# Patient Record
Sex: Male | Born: 1990 | Race: White | Hispanic: No | Marital: Single | Smoking: Current every day smoker
Health system: Southern US, Community
[De-identification: ages and names within clinical notes are randomized; demographics above are authoritative.]

## PROBLEM LIST (undated history)

## (undated) DIAGNOSIS — R51 Headache: Secondary | ICD-10-CM

## (undated) DIAGNOSIS — R519 Headache, unspecified: Secondary | ICD-10-CM

## (undated) DIAGNOSIS — T7840XA Allergy, unspecified, initial encounter: Secondary | ICD-10-CM

## (undated) DIAGNOSIS — N059 Unspecified nephritic syndrome with unspecified morphologic changes: Secondary | ICD-10-CM

## (undated) HISTORY — DX: Headache, unspecified: R51.9

## (undated) HISTORY — DX: Allergy, unspecified, initial encounter: T78.40XA

## (undated) HISTORY — DX: Headache: R51

---
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
## 2010-04-20 ENCOUNTER — Emergency Department: Payer: Self-pay | Admitting: Emergency Medicine

## 2012-07-02 ENCOUNTER — Emergency Department: Payer: Self-pay | Admitting: Emergency Medicine

## 2012-07-02 LAB — CBC WITH DIFFERENTIAL/PLATELET
Basophil #: 0 10*3/uL (ref 0.0–0.1)
Basophil %: 0.5 %
Eosinophil #: 0.1 10*3/uL (ref 0.0–0.7)
Eosinophil %: 1 %
HGB: 15.6 g/dL (ref 13.0–18.0)
Lymphocyte #: 1.9 10*3/uL (ref 1.0–3.6)
MCH: 34.2 pg — ABNORMAL HIGH (ref 26.0–34.0)
MCHC: 35.7 g/dL (ref 32.0–36.0)
MCV: 96 fL (ref 80–100)
Monocyte #: 0.5 x10 3/mm (ref 0.2–1.0)
Monocyte %: 8 %
Neutrophil #: 3.6 10*3/uL (ref 1.4–6.5)
Neutrophil %: 58.5 %
RBC: 4.57 10*6/uL (ref 4.40–5.90)

## 2012-07-02 LAB — ETHANOL
Ethanol %: 0.166 % — ABNORMAL HIGH (ref 0.000–0.080)
Ethanol: 166 mg/dL

## 2012-07-02 LAB — COMPREHENSIVE METABOLIC PANEL
Alkaline Phosphatase: 83 U/L (ref 50–136)
BUN: 7 mg/dL (ref 7–18)
Bilirubin,Total: 0.4 mg/dL (ref 0.2–1.0)
Creatinine: 0.8 mg/dL (ref 0.60–1.30)
EGFR (African American): >60
EGFR (Non-African Amer.): >60
Glucose: 103 mg/dL — ABNORMAL HIGH (ref 65–99)
Potassium: 4.1 mmol/L (ref 3.5–5.1)
SGOT(AST): 30 U/L (ref 15–37)
SGPT (ALT): 20 U/L
Total Protein: 7.4 g/dL (ref 6.4–8.2)

## 2014-02-23 ENCOUNTER — Encounter: Payer: Self-pay | Admitting: Adult Health

## 2014-02-23 ENCOUNTER — Ambulatory Visit (INDEPENDENT_AMBULATORY_CARE_PROVIDER_SITE_OTHER): Payer: 59 | Admitting: Adult Health

## 2014-02-23 VITALS — BP 120/68 | HR 92 | Temp 98.1°F | Resp 16 | Ht 67.5 in | Wt 164.0 lb

## 2014-02-23 DIAGNOSIS — Z0000 Encounter for general adult medical examination without abnormal findings: Secondary | ICD-10-CM | POA: Insufficient documentation

## 2014-02-23 LAB — CBC WITH DIFFERENTIAL/PLATELET
Basophils Absolute: 0 10*3/uL (ref 0.0–0.1)
Basophils Relative: 0.3 % (ref 0.0–3.0)
EOS ABS: 0.3 10*3/uL (ref 0.0–0.7)
Eosinophils Relative: 3.9 % (ref 0.0–5.0)
HEMATOCRIT: 46.9 % (ref 39.0–52.0)
Hemoglobin: 16.1 g/dL (ref 13.0–17.0)
LYMPHS ABS: 2.3 10*3/uL (ref 0.7–4.0)
Lymphocytes Relative: 28.4 % (ref 12.0–46.0)
MCHC: 34.4 g/dL (ref 30.0–36.0)
MCV: 95.9 fl (ref 78.0–100.0)
MONO ABS: 0.6 10*3/uL (ref 0.1–1.0)
Monocytes Relative: 6.9 % (ref 3.0–12.0)
Neutro Abs: 4.9 10*3/uL (ref 1.4–7.7)
Neutrophils Relative %: 60.5 % (ref 43.0–77.0)
PLATELETS: 282 10*3/uL (ref 150.0–400.0)
RBC: 4.89 Mil/uL (ref 4.22–5.81)
RDW: 13.3 % (ref 11.5–14.6)
WBC: 8 10*3/uL (ref 4.5–10.5)

## 2014-02-23 LAB — COMPREHENSIVE METABOLIC PANEL
ALBUMIN: 4.5 g/dL (ref 3.5–5.2)
ALT: 19 U/L (ref 0–53)
AST: 25 U/L (ref 0–37)
Alkaline Phosphatase: 66 U/L (ref 39–117)
BUN: 11 mg/dL (ref 6–23)
CALCIUM: 9.4 mg/dL (ref 8.4–10.5)
CHLORIDE: 104 meq/L (ref 96–112)
CO2: 25 meq/L (ref 19–32)
CREATININE: 0.9 mg/dL (ref 0.4–1.5)
GFR: 115.38 mL/min (ref >60.00)
Glucose, Bld: 74 mg/dL (ref 70–99)
POTASSIUM: 4.1 meq/L (ref 3.5–5.1)
Sodium: 138 mEq/L (ref 135–145)
Total Bilirubin: 0.6 mg/dL (ref 0.3–1.2)
Total Protein: 7.4 g/dL (ref 6.0–8.3)

## 2014-02-23 LAB — LIPID PANEL
CHOL/HDL RATIO: 3
Cholesterol: 200 mg/dL (ref 0–200)
HDL: 71.8 mg/dL (ref >39.00)
LDL Cholesterol: 107 mg/dL — ABNORMAL HIGH (ref 0–99)
TRIGLYCERIDES: 104 mg/dL (ref 0.0–149.0)
VLDL: 20.8 mg/dL (ref 0.0–40.0)

## 2014-02-23 NOTE — Progress Notes (Signed)
Pre visit review using our clinic review tool, if applicable. No additional management support is needed unless otherwise documented below in the visit note. 

## 2014-02-23 NOTE — Progress Notes (Signed)
Patient ID: Jerome Mccormick, male   DOB: 1991/07/22, 23 y.o.   MRN: [id number]   Subjective:    Patient ID: Jerome Mccormick, male    DOB: 19-Jan-1991, 23 y.o.   MRN: [id number]  HPI  Patient is a pleasant 23 year old male who presents to clinic to establish care. He has not had a primary care provider in many years. He is feeling well overall. Lives at home with his mother. Unemployed at the present time.    Past Medical History  Diagnosis Date  . Allergy   . Headache    Surgical Hx: non contributory  Family Hx: Positive for CAD, DM,   History   Social History  . Marital Status: Single    Spouse Name: N/A    Number of Children: 0  . Years of Education: 11   Occupational History  . Restaurant work    Social History Main Topics  . Smoking status: Current Every Day Smoker -- 0.75 packs/day    Types: Cigarettes  . Smokeless tobacco: Never Used  . Alcohol Use: Yes     Comment: >100 oz per week - beer  . Drug Use: Yes    Special: Marijuana  . Sexual Activity: Not on file   Other Topics Concern  . Not on file   Social History Narrative   Sade grew up in [place], [place]. Currently unemployed. He enjoys fixing things around the house. He also enjoys hanging out with friends.    Review of Systems  Constitutional: Negative.   Eyes: Negative.   Respiratory: Negative.   Cardiovascular: Negative.   Gastrointestinal: Negative.   Endocrine: Negative.   Genitourinary: Negative.   Musculoskeletal: Negative.   Skin: Negative.   Allergic/Immunologic: Negative.   Neurological: Positive for headaches.  Hematological: Negative.   Psychiatric/Behavioral: Negative.  Negative for sleep disturbance.       Objective:  BP 120/68  Pulse 92  Temp(Src) 98.1 F (36.7 C) (Oral)  Resp 16  Ht 5' 7.5" (1.715 m)  Wt 164 lb (74.39 kg)  BMI 25.29 kg/m2  SpO2 96%   Physical Exam  Constitutional: He is oriented to person, place, and time. He appears well-developed and well-nourished. No  distress.  HENT:  Head: Normocephalic and atraumatic.  Right Ear: External ear normal.  Left Ear: External ear normal.  Nose: Nose normal.  Mouth/Throat: Oropharynx is clear and moist.  Eyes: Conjunctivae and EOM are normal. Pupils are equal, round, and reactive to light.  Neck: Normal range of motion. Neck supple. No tracheal deviation present. No thyromegaly present.  Cardiovascular: Normal rate, regular rhythm, normal heart sounds and intact distal pulses.  Exam reveals no gallop and no friction rub.   No murmur heard. Pulmonary/Chest: Effort normal and breath sounds normal. No respiratory distress. He has no wheezes. He has no rales.  Abdominal: Soft. Bowel sounds are normal. He exhibits no distension and no mass. There is no tenderness. There is no rebound and no guarding.  Musculoskeletal: Normal range of motion. He exhibits no edema and no tenderness.  Lymphadenopathy:    He has no cervical adenopathy.  Neurological: He is alert and oriented to person, place, and time. He has normal reflexes. No cranial nerve deficit. Coordination normal.  Skin: Skin is warm and dry.  Psychiatric: He has a normal mood and affect. His behavior is normal. Judgment and thought content normal.       Assessment & Plan:   1. Routine general medical examination at a health care facility  Normal exam. No records to request since pt has not been to doctor in many years. Is mainly here at the request of his mother. Check routine labs. If all normal, follow up in 1 year or sooner if necessary.  - CBC with Differential - Lipid panel - Comprehensive metabolic panel

## 2014-02-24 ENCOUNTER — Encounter: Payer: Self-pay | Admitting: *Deleted

## 2014-02-24 ENCOUNTER — Telehealth: Payer: Self-pay | Admitting: Adult Health

## 2014-02-24 NOTE — Telephone Encounter (Signed)
Relevant patient education mailed to patient.  

## 2014-03-11 ENCOUNTER — Emergency Department: Payer: Self-pay | Admitting: Emergency Medicine

## 2014-03-11 LAB — COMPREHENSIVE METABOLIC PANEL
ALT: 20 U/L (ref 12–78)
Albumin: 3.7 g/dL (ref 3.4–5.0)
Alkaline Phosphatase: 73 U/L
Anion Gap: 6 — ABNORMAL LOW (ref 7–16)
BUN: 4 mg/dL — AB (ref 7–18)
Bilirubin,Total: 0.4 mg/dL (ref 0.2–1.0)
CALCIUM: 8.6 mg/dL (ref 8.5–10.1)
CHLORIDE: 109 mmol/L — AB (ref 98–107)
CREATININE: 0.96 mg/dL (ref 0.60–1.30)
Co2: 27 mmol/L (ref 21–32)
EGFR (African American): >60
EGFR (Non-African Amer.): >60
GLUCOSE: 73 mg/dL (ref 65–99)
Osmolality: 279 (ref 275–301)
POTASSIUM: 3.7 mmol/L (ref 3.5–5.1)
SGOT(AST): 19 U/L (ref 15–37)
Sodium: 142 mmol/L (ref 136–145)
Total Protein: 6.9 g/dL (ref 6.4–8.2)

## 2014-03-11 LAB — CBC
HCT: 46.6 % (ref 40.0–52.0)
HGB: 15.5 g/dL (ref 13.0–18.0)
MCH: 32.1 pg (ref 26.0–34.0)
MCHC: 33.2 g/dL (ref 32.0–36.0)
MCV: 97 fL (ref 80–100)
Platelet: 172 10*3/uL (ref 150–440)
RBC: 4.82 10*6/uL (ref 4.40–5.90)
RDW: 13.4 % (ref 11.5–14.5)
WBC: 5.6 10*3/uL (ref 3.8–10.6)

## 2014-03-11 LAB — ACETAMINOPHEN LEVEL
ACETAMINOPHEN: 34 ug/mL — AB
Acetaminophen: 19 ug/mL

## 2014-03-11 LAB — ETHANOL
ETHANOL LVL: 176 mg/dL
Ethanol %: 0.176 % — ABNORMAL HIGH (ref 0.000–0.080)

## 2014-03-11 LAB — SALICYLATE LEVEL
Salicylates, Serum: 3.1 mg/dL — ABNORMAL HIGH
Salicylates, Serum: 3.4 mg/dL — ABNORMAL HIGH

## 2015-03-24 NOTE — Consult Note (Signed)
PATIENT NAME:  Jerome Mccormick, Jerome Mccormick MR#:  [id number] DATE OF BIRTH:  [birth year]  DATE OF CONSULTATION:  03/11/2014  REFERRING PHYSICIAN:   CONSULTING PHYSICIAN:  Hailee Hollick K. Lanell Dubie, MD  AGE: 24.  SEX: Male.  RACE: White.  HISTORY OF PRESENT ILLNESS: The patient was seen in consultation room number 22. The patient is a 24 year old white male, not employed, not married, and lives with his mother and father died. The patient and mother live in a trailer. The patient was brought to [facility] Emergency Room by the mother after she called the sheriff's office and brought on IVC because he had arguments with her. The patient reports that he had conflicts and arguments with the mother about finances.  PAST PSYCHIATRIC HISTORY: No previous history of inpatient or psychiatry. No history of suicide attempt, not being followed by any psychiatry.   ALCOHOL AND DRUGS: Admits that he has been drinking alcohol at the rate of 40 ounces of beer every other day for the past few years. In addition, he has been smoking THC at the rate of 1 gram a day as many days as he can get. Does admit smoking nicotine cigarettes. According to the information obtained from the nurse, the patient's blood levels of aspirin and Tylenol were elevated and his blood alcohol level was 17. The patient informed that he has been taking aspirin and Tylenol given to him by his next door neighbor because he has migraine headaches.  MENTAL STATUS EXAMINATION: The patient is dressed in hospital clothes, alert and oriented. Appears competent and cooperative. No agitation. Denies feeling depressed. Denies feeling hopeless, helpless. Does not appear to be responding to internal stimuli. Denies any ideas of plans to hurt himself or others and contracts for safety. He realizes that he had conflicts and arguments with his mother and wants to go back home and apologize to her. Insight and judgment guarded.  IMPRESSION: Polysubstance abuse and dependence on alcohol,  THC, chronic continuous. Impulse control disorder and family relationship problems with conflicts with mother with whom he lives. According to information obtained from the staff nurse, the mother reports that patient is not himself and does not appear to be himself and he is not acting like himself and when patient was explained, he reports that this was not correct and was wrong and that he was himself. Staff nurse reports that patient has been refusing to give urine drug screen samples and when patient was confronted he stated that he will give it but it will be dirty because he has been using THC.  PLAN: Will observe the patient over the weekend and Monday morning, that is 03/13/2014, Mr. Jerome Mccormick will contact the mother and with the help of the floor physician, will make a proper disposition and planning and probable mother and patient will be recommended for counseling sessions for better understanding of each other along with referral for substance abuse program if patient and mother are interested.    ____________________________ Jannet MantisSurya K. Guss Bundehalla, MD skc:lt D: 03/11/2014 22:56:26 ET T: 03/12/2014 02:17:47 ET JOB#: [id number]  cc: Monika SalkSurya K. Guss Bundehalla, MD, <Dictator> Beau FannySURYA K Aitana Burry MD ELECTRONICALLY SIGNED 03/12/2014 19:09

## 2015-03-24 NOTE — Consult Note (Signed)
PATIENT NAME:  Jerome Mccormick, Jerome Mccormick MR#:  [id number] DATE OF BIRTH:  Apr 24, 1991  DATE OF CONSULTATION:  03/11/2014  REFERRING PHYSICIAN:   CONSULTING PHYSICIAN:  Myrtis Maille K. Miarose Lippert, MD  ADDENDUM:  AGE: 24.  SEX: Male.  RACE: White.  : The patient was seen in [facility] Emergency Room. This is an addendum to the previous note. Discussed with patient and discussed with the staff here. It was in the best interest that patient be discharged after discontinue IVC as he contracted for safety and is eager to go home and apologize to the mother for the conflicts and arguments he had and that he would like to get for help on an outpatient basis for family counseling for better understanding of each other. The patient is discontinued IVC and will be discharged home.    ____________________________ Jannet MantisSurya K. Guss Bundehalla, MD skc:lt D: 03/11/2014 23:09:05 ET T: 03/12/2014 02:45:34 ET JOB#: [id number]  cc: Monika SalkSurya K. Guss Bundehalla, MD, <Dictator> Beau FannySURYA K Tashanti Dalporto MD ELECTRONICALLY SIGNED 03/12/2014 19:08

## 2017-12-15 ENCOUNTER — Other Ambulatory Visit: Payer: Self-pay

## 2017-12-15 ENCOUNTER — Emergency Department
Admission: EM | Admit: 2017-12-15 | Discharge: 2017-12-15 | Disposition: A | Discharge status: Home / Self Care | Payer: Self-pay | Attending: Emergency Medicine | Admitting: Emergency Medicine

## 2017-12-15 ENCOUNTER — Emergency Department: Payer: Self-pay

## 2017-12-15 DIAGNOSIS — Y92009 Unspecified place in unspecified non-institutional (private) residence as the place of occurrence of the external cause: Secondary | ICD-10-CM | POA: Insufficient documentation

## 2017-12-15 DIAGNOSIS — Y999 Unspecified external cause status: Secondary | ICD-10-CM | POA: Insufficient documentation

## 2017-12-15 DIAGNOSIS — S0990XA Unspecified injury of head, initial encounter: Secondary | ICD-10-CM | POA: Insufficient documentation

## 2017-12-15 DIAGNOSIS — W01190A Fall on same level from slipping, tripping and stumbling with subsequent striking against furniture, initial encounter: Secondary | ICD-10-CM | POA: Insufficient documentation

## 2017-12-15 DIAGNOSIS — Y939 Activity, unspecified: Secondary | ICD-10-CM | POA: Insufficient documentation

## 2017-12-15 DIAGNOSIS — S51811A Laceration without foreign body of right forearm, initial encounter: Secondary | ICD-10-CM | POA: Insufficient documentation

## 2017-12-15 DIAGNOSIS — F17210 Nicotine dependence, cigarettes, uncomplicated: Secondary | ICD-10-CM | POA: Insufficient documentation

## 2017-12-15 DIAGNOSIS — F10920 Alcohol use, unspecified with intoxication, uncomplicated: Secondary | ICD-10-CM | POA: Insufficient documentation

## 2017-12-15 LAB — ETHANOL: ALCOHOL ETHYL (B): 267 mg/dL — AB (ref <10)

## 2017-12-15 MED ORDER — LIDOCAINE HCL (PF) 1 % IJ SOLN
5.0000 mL | Freq: Once | INTRAMUSCULAR | Status: AC
  Administered 2017-12-15: 5 mL via INTRADERMAL

## 2017-12-15 MED ORDER — BACITRACIN ZINC 500 UNIT/GM EX OINT
TOPICAL_OINTMENT | CUTANEOUS | Status: AC
  Filled 2017-12-15: qty 0.9

## 2017-12-15 MED ORDER — LIDOCAINE HCL (PF) 1 % IJ SOLN
INTRAMUSCULAR | Status: AC
  Administered 2017-12-15: 5 mL via INTRADERMAL
  Filled 2017-12-15: qty 5

## 2017-12-15 NOTE — ED Notes (Signed)
Patient transported to CT 

## 2017-12-15 NOTE — ED Notes (Signed)
Bacitracin placed on sutures and wrapped
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
## 2017-12-15 NOTE — ED Provider Notes (Signed)
[facility] Emergency Department Provider Note ____   First MD Initiated Contact with Patient 12/15/17 [phone]     (approximate)  I have reviewed the triage vital signs and the nursing notes.   HISTORY  Chief Complaint Alcohol Intoxication and Fall    HPI Jerome Mccormick is a 27 y.o. male presents to the emergency department via EMS following accidental fall with head injury and right forearm laceration.  Patient admits to heavy EtOH ingestion tonight stating approximately 8 shots of whiskey.  Patient states that he fell into a lamp at home    Past Medical History:  Diagnosis Date  . Allergy   . Headache     Patient Active Problem List   Diagnosis Date Noted  . Routine general medical examination at a health care facility 02/23/2014    No past surgical history on file.  Prior to Admission medications   Not on File    Allergies No known drug allergies No family history on file.  Social History Social History   Tobacco Use  . Smoking status: Current Every Day Smoker    Packs/day: 0.75    Types: Cigarettes  . Smokeless tobacco: Never Used  Substance Use Topics  . Alcohol use: Yes    Comment: >100 oz per week - beer  . Drug use: Yes    Types: Marijuana    Review of Systems Constitutional: No fever/chills Eyes: No visual changes. ENT: No sore throat. Cardiovascular: Denies chest pain. Respiratory: Denies shortness of breath. Gastrointestinal: No abdominal pain.  No nausea, no vomiting.  No diarrhea.  No constipation. Genitourinary: Negative for dysuria. Musculoskeletal: Negative for neck pain.  Negative for back pain. Integumentary: Negative for rash. Neurological: Negative for headaches, focal weakness or numbness. Psychiatric: Appears intoxicated heavy EtOH ingestion  ____________________________________________   PHYSICAL EXAM:  VITAL SIGNS: ED Triage Vitals  Enc Vitals Group     BP 12/15/17 0108 122/76     Pulse Rate  12/15/17 0108 93     Resp 12/15/17 0108 18     Temp 12/15/17 0108 97.9 F (36.6 C)     Temp Source 12/15/17 0108 Oral     SpO2 12/15/17 0108 96 %     Weight 12/15/17 0109 65.8 kg (145 lb)     Height 12/15/17 0109 1.74 m (5' 8.5")     Head Circumference --      Peak Flow --      Pain Score 12/15/17 0106 7     Pain Loc --      Pain Edu? --      Excl. in GC? --     Constitutional: Alert and oriented.  Appears intoxicated.   Eyes: Conjunctivae are normal.  Head: Atraumatic. Mouth/Throat: Mucous membranes are moist. Oropharynx non-erythematous. Neck: No stridor.   Cardiovascular: Normal rate, regular rhythm. Good peripheral circulation. Grossly normal heart sounds. Respiratory: Normal respiratory effort.  No retractions. Lungs CTAB. Gastrointestinal: Soft and nontender. No distention.  Musculoskeletal: No lower extremity tenderness nor edema. No gross deformities of extremities. Neurologic:  Normal speech and language. No gross focal neurologic deficits are appreciated.  Skin: 3 inch laceration dorsal aspect of the right forearm Psychiatric: Mood and affect are normal. Speech and behavior are normal.  ____________________________________________   LABS (all labs ordered are listed, but only abnormal results are displayed)  Labs Reviewed  ETHANOL - Abnormal; Notable for the following components:      Result Value   Alcohol, Ethyl (B) 267 (*)  All other components within normal limits   _____  RADIOLOGY I, [place] Doyne Micke, personally viewed and evaluated these images (plain radiographs) as part of my medical decision making, as well as reviewing the written report by the radiologist.  Ct Head Wo Contrast  Result Date: 12/15/2017 CLINICAL DATA:  Fall.  Head trauma. EXAM: CT HEAD WITHOUT CONTRAST TECHNIQUE: Contiguous axial images were obtained from the base of the skull through the vertex without intravenous contrast. COMPARISON:  07/02/2012 head CT. FINDINGS: Brain: Stable  prominent CSF density in the left posterior aspect of the posterior fossa, most compatible with an arachnoid cyst. No evidence of parenchymal hemorrhage or extra-axial fluid collection. No mass lesion, mass effect, or midline shift. No CT evidence of acute infarction. Cerebral volume is age appropriate. No ventriculomegaly. Vascular: No acute abnormality. Skull: No evidence of calvarial fracture. Sinuses/Orbits: The visualized paranasal sinuses are essentially clear. Other:  The mastoid air cells are unopacified. IMPRESSION: 1. No evidence of acute intracranial abnormality. No evidence of calvarial fracture. 2. 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 Electronically Signed   By: Delbert PhenixJason A Poff M.D.   On: 12/15/2017 02:16    ____________________________________________   PROCEDURES    Procedure(s) performed:   Marland Kitchen.Marland Kitchen.Laceration Repair Date/Time: 12/15/2017 3:12 AM Performed by: Darci CurrentBrown, [place] N, MD Authorized by: Darci CurrentBrown, [place] N, MD   Consent:    Consent obtained:  Verbal   Consent given by:  Patient and parent   Risks discussed:  Infection, pain and poor cosmetic result   Alternatives discussed:  No treatment Anesthesia (see MAR for exact dosages):    Anesthesia method:  Local infiltration   Local anesthetic:  Lidocaine 1% w/o epi Laceration details:    Location:  Shoulder/arm   Shoulder/arm location:  R lower arm   Length (cm):  9   Depth (mm):  8 Repair type:    Repair type:  Simple Pre-procedure details:    Preparation:  Patient was prepped and draped in usual sterile fashion Exploration:    Contaminated: no   Treatment:    Area cleansed with:  Betadine and saline   Amount of cleaning:  Standard   Visualized foreign bodies/material removed: no   Skin repair:    Repair method:  Sutures   Suture size:  4-0   Suture material:  Nylon   Number of sutures:  9 Approximation:    Approximation:  Close Post-procedure details:    Dressing:  Antibiotic ointment   Patient  tolerance of procedure:  Tolerated well, no immediate complications     ____________________________________________   INITIAL IMPRESSION / ASSESSMENT AND PLAN / ED COURSE  As part of my medical decision making, I reviewed the following data within the electronic MEDICAL RECORD 89 year old male presenting to the emergency department suspected alcohol intoxication with right forearm laceration.  Given history of head injury CT scan of the head was performed which was unremarkable.  Patient's wound was repaired without difficulty.  Patient will be discharged home in the custody of his mother and aunt. ____________________________________________  FINAL CLINICAL IMPRESSION(S) / ED DIAGNOSES  Final diagnoses:  Alcoholic intoxication without complication (HCC)  Forearm laceration, right, initial encounter     MEDICATIONS GIVEN DURING THIS VISIT:  Medications  lidocaine (PF) (XYLOCAINE) 1 % injection 5 mL (not administered)     ED Discharge Orders    None       Note:  This document was prepared using Dragon voice recognition software and may include unintentional dictation errors.  Darci Current, MD 12/15/17 [phone]

## 2021-01-10 ENCOUNTER — Emergency Department: Payer: No Typology Code available for payment source

## 2021-01-10 ENCOUNTER — Other Ambulatory Visit: Payer: Self-pay

## 2021-01-10 ENCOUNTER — Emergency Department
Admission: EM | Admit: 2021-01-10 | Discharge: 2021-01-10 | Disposition: A | Discharge status: Home / Self Care | Payer: No Typology Code available for payment source | Attending: Emergency Medicine | Admitting: Emergency Medicine

## 2021-01-10 DIAGNOSIS — F17210 Nicotine dependence, cigarettes, uncomplicated: Secondary | ICD-10-CM | POA: Diagnosis not present

## 2021-01-10 DIAGNOSIS — Y92410 Unspecified street and highway as the place of occurrence of the external cause: Secondary | ICD-10-CM | POA: Insufficient documentation

## 2021-01-10 DIAGNOSIS — S6992XA Unspecified injury of left wrist, hand and finger(s), initial encounter: Secondary | ICD-10-CM | POA: Diagnosis present

## 2021-01-10 DIAGNOSIS — S63502A Unspecified sprain of left wrist, initial encounter: Secondary | ICD-10-CM | POA: Insufficient documentation

## 2021-01-10 DIAGNOSIS — S6292XA Unspecified fracture of left wrist and hand, initial encounter for closed fracture: Secondary | ICD-10-CM | POA: Insufficient documentation

## 2021-01-10 MED ORDER — HYDROCODONE-ACETAMINOPHEN 5-325 MG PO TABS
1.0000 | ORAL_TABLET | Freq: Once | ORAL | Status: AC
  Administered 2021-01-10: 1 via ORAL
  Filled 2021-01-10: qty 1

## 2021-01-10 MED ORDER — HYDROCODONE-ACETAMINOPHEN 5-325 MG PO TABS: 1.0000 | ORAL_TABLET | Freq: Three times a day (TID) | ORAL | 0 refills | Status: DC | PRN
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
## 2021-01-10 NOTE — Discharge Instructions (Signed)
You have a stable fracture to the base of your third finger within the hand.  This will likely heal without surgical intervention.  Wear the splint until symptoms improve.  Take the pain medicine as needed.  Take over-the-counter Tylenol for nondrowsy pain relief.  You may apply ice to reduce swelling.  Return to the ED if needed.

## 2021-03-02 ENCOUNTER — Other Ambulatory Visit: Payer: Self-pay

## 2021-03-02 ENCOUNTER — Emergency Department: Payer: Medicaid Other

## 2021-03-02 ENCOUNTER — Inpatient Hospital Stay
Admission: EM | Admit: 2021-03-02 | Discharge: 2021-03-11 | DRG: 682 | Disposition: A | Discharge status: Home Health | Payer: Medicaid Other | Attending: Internal Medicine | Admitting: Internal Medicine

## 2021-03-02 DIAGNOSIS — N179 Acute kidney failure, unspecified: Secondary | ICD-10-CM

## 2021-03-02 DIAGNOSIS — G43909 Migraine, unspecified, not intractable, without status migrainosus: Secondary | ICD-10-CM | POA: Diagnosis present

## 2021-03-02 DIAGNOSIS — I330 Acute and subacute infective endocarditis: Secondary | ICD-10-CM | POA: Diagnosis present

## 2021-03-02 DIAGNOSIS — Z833 Family history of diabetes mellitus: Secondary | ICD-10-CM | POA: Diagnosis not present

## 2021-03-02 DIAGNOSIS — F17210 Nicotine dependence, cigarettes, uncomplicated: Secondary | ICD-10-CM | POA: Diagnosis present

## 2021-03-02 DIAGNOSIS — R319 Hematuria, unspecified: Secondary | ICD-10-CM

## 2021-03-02 DIAGNOSIS — B952 Enterococcus as the cause of diseases classified elsewhere: Secondary | ICD-10-CM | POA: Diagnosis present

## 2021-03-02 DIAGNOSIS — E875 Hyperkalemia: Secondary | ICD-10-CM | POA: Diagnosis not present

## 2021-03-02 DIAGNOSIS — Z20822 Contact with and (suspected) exposure to covid-19: Secondary | ICD-10-CM | POA: Diagnosis present

## 2021-03-02 DIAGNOSIS — R7881 Bacteremia: Secondary | ICD-10-CM | POA: Diagnosis present

## 2021-03-02 DIAGNOSIS — I776 Arteritis, unspecified: Secondary | ICD-10-CM | POA: Diagnosis present

## 2021-03-02 DIAGNOSIS — I511 Rupture of chordae tendineae, not elsewhere classified: Secondary | ICD-10-CM | POA: Diagnosis present

## 2021-03-02 DIAGNOSIS — D509 Iron deficiency anemia, unspecified: Secondary | ICD-10-CM | POA: Diagnosis present

## 2021-03-02 DIAGNOSIS — F1010 Alcohol abuse, uncomplicated: Secondary | ICD-10-CM | POA: Diagnosis present

## 2021-03-02 DIAGNOSIS — N170 Acute kidney failure with tubular necrosis: Principal | ICD-10-CM | POA: Diagnosis present

## 2021-03-02 DIAGNOSIS — E871 Hypo-osmolality and hyponatremia: Secondary | ICD-10-CM | POA: Diagnosis not present

## 2021-03-02 DIAGNOSIS — I340 Nonrheumatic mitral (valve) insufficiency: Secondary | ICD-10-CM | POA: Diagnosis present

## 2021-03-02 DIAGNOSIS — R809 Proteinuria, unspecified: Secondary | ICD-10-CM

## 2021-03-02 DIAGNOSIS — Z8249 Family history of ischemic heart disease and other diseases of the circulatory system: Secondary | ICD-10-CM | POA: Diagnosis not present

## 2021-03-02 DIAGNOSIS — D649 Anemia, unspecified: Secondary | ICD-10-CM

## 2021-03-02 DIAGNOSIS — I059 Rheumatic mitral valve disease, unspecified: Secondary | ICD-10-CM

## 2021-03-02 DIAGNOSIS — R011 Cardiac murmur, unspecified: Secondary | ICD-10-CM | POA: Diagnosis present

## 2021-03-02 DIAGNOSIS — N048 Nephrotic syndrome with other morphologic changes: Secondary | ICD-10-CM | POA: Diagnosis present

## 2021-03-02 DIAGNOSIS — F1290 Cannabis use, unspecified, uncomplicated: Secondary | ICD-10-CM | POA: Diagnosis present

## 2021-03-02 DIAGNOSIS — R040 Epistaxis: Secondary | ICD-10-CM | POA: Diagnosis present

## 2021-03-02 DIAGNOSIS — Z841 Family history of disorders of kidney and ureter: Secondary | ICD-10-CM | POA: Diagnosis not present

## 2021-03-02 DIAGNOSIS — K5900 Constipation, unspecified: Secondary | ICD-10-CM | POA: Diagnosis present

## 2021-03-02 LAB — URINALYSIS, COMPLETE (UACMP) WITH MICROSCOPIC
Bacteria, UA: NONE SEEN
Bilirubin Urine: NEGATIVE
Glucose, UA: NEGATIVE mg/dL
Ketones, ur: NEGATIVE mg/dL
Nitrite: NEGATIVE
Protein, ur: >=300 mg/dL — AB
RBC / HPF: >50 RBC/hpf — ABNORMAL HIGH (ref 0–5)
Specific Gravity, Urine: 1.012 (ref 1.005–1.030)
WBC, UA: >50 WBC/hpf — ABNORMAL HIGH (ref 0–5)
pH: 5 (ref 5.0–8.0)

## 2021-03-02 LAB — COMPREHENSIVE METABOLIC PANEL
ALT: 12 U/L (ref 0–44)
AST: 15 U/L (ref 15–41)
Albumin: 2.7 g/dL — ABNORMAL LOW (ref 3.5–5.0)
Alkaline Phosphatase: 46 U/L (ref 38–126)
Anion gap: 8 (ref 5–15)
BUN: 75 mg/dL — ABNORMAL HIGH (ref 6–20)
CO2: 24 mmol/L (ref 22–32)
Calcium: 7.2 mg/dL — ABNORMAL LOW (ref 8.9–10.3)
Chloride: 98 mmol/L (ref 98–111)
Creatinine, Ser: 4.31 mg/dL — ABNORMAL HIGH (ref 0.61–1.24)
GFR, Estimated: 18 mL/min — ABNORMAL LOW (ref >60)
Glucose, Bld: 97 mg/dL (ref 70–99)
Potassium: 4.3 mmol/L (ref 3.5–5.1)
Sodium: 130 mmol/L — ABNORMAL LOW (ref 135–145)
Total Bilirubin: 0.4 mg/dL (ref 0.3–1.2)
Total Protein: 6.5 g/dL (ref 6.5–8.1)

## 2021-03-02 LAB — CBC WITH DIFFERENTIAL/PLATELET
Abs Immature Granulocytes: 0.07 10*3/uL (ref 0.00–0.07)
Basophils Absolute: 0 10*3/uL (ref 0.0–0.1)
Basophils Relative: 0 %
Eosinophils Absolute: 0 10*3/uL (ref 0.0–0.5)
Eosinophils Relative: 0 %
HCT: 27.5 % — ABNORMAL LOW (ref 39.0–52.0)
Hemoglobin: 8.8 g/dL — ABNORMAL LOW (ref 13.0–17.0)
Immature Granulocytes: 1 %
Lymphocytes Relative: 15 %
Lymphs Abs: 1.2 10*3/uL (ref 0.7–4.0)
MCH: 28.2 pg (ref 26.0–34.0)
MCHC: 32 g/dL (ref 30.0–36.0)
MCV: 88.1 fL (ref 80.0–100.0)
Monocytes Absolute: 0.7 10*3/uL (ref 0.1–1.0)
Monocytes Relative: 9 %
Neutro Abs: 5.7 10*3/uL (ref 1.7–7.7)
Neutrophils Relative %: 75 %
Platelets: 309 10*3/uL (ref 150–400)
RBC: 3.12 MIL/uL — ABNORMAL LOW (ref 4.22–5.81)
RDW: 13.4 % (ref 11.5–15.5)
WBC: 7.6 10*3/uL (ref 4.0–10.5)
nRBC: 0 % (ref 0.0–0.2)

## 2021-03-02 LAB — URINE DRUG SCREEN, QUALITATIVE (ARMC ONLY)
Amphetamines, Ur Screen: NOT DETECTED
Barbiturates, Ur Screen: NOT DETECTED
Benzodiazepine, Ur Scrn: NOT DETECTED
Cannabinoid 50 Ng, Ur ~~LOC~~: POSITIVE — AB
Cocaine Metabolite,Ur ~~LOC~~: NOT DETECTED
MDMA (Ecstasy)Ur Screen: NOT DETECTED
Methadone Scn, Ur: NOT DETECTED
Opiate, Ur Screen: NOT DETECTED
Phencyclidine (PCP) Ur S: NOT DETECTED
Tricyclic, Ur Screen: POSITIVE — AB

## 2021-03-02 LAB — RESP PANEL BY RT-PCR (FLU A&B, COVID) ARPGX2
Influenza A by PCR: NEGATIVE
Influenza B by PCR: NEGATIVE
SARS Coronavirus 2 by RT PCR: NEGATIVE

## 2021-03-02 MED ORDER — SODIUM CHLORIDE 0.9 % IV BOLUS
1000.0000 mL | Freq: Once | INTRAVENOUS | Status: AC
  Administered 2021-03-02: 1000 mL via INTRAVENOUS

## 2021-03-02 MED ORDER — POLYETHYLENE GLYCOL 3350 17 G PO PACK
17.0000 g | PACK | Freq: Every day | ORAL | Status: DC | PRN
  Administered 2021-03-05 – 2021-03-10 (×3): 17 g via ORAL
  Filled 2021-03-02 (×3): qty 1

## 2021-03-02 MED ORDER — METHOCARBAMOL 1000 MG/10ML IJ SOLN
500.0000 mg | Freq: Four times a day (QID) | INTRAVENOUS | Status: DC | PRN
  Filled 2021-03-02: qty 5

## 2021-03-02 MED ORDER — ACETAMINOPHEN 650 MG RE SUPP
650.0000 mg | Freq: Four times a day (QID) | RECTAL | Status: DC | PRN
Start: 2021-03-02 — End: 2021-03-11

## 2021-03-02 MED ORDER — ONDANSETRON HCL 4 MG/2ML IJ SOLN
4.0000 mg | Freq: Four times a day (QID) | INTRAMUSCULAR | Status: DC | PRN
  Administered 2021-03-03 – 2021-03-10 (×9): 4 mg via INTRAVENOUS
  Filled 2021-03-02 (×9): qty 2

## 2021-03-02 MED ORDER — SODIUM CHLORIDE 0.9 % IV SOLN
1.0000 g | Freq: Once | INTRAVENOUS | Status: AC
  Administered 2021-03-02: 1 g via INTRAVENOUS
  Filled 2021-03-02: qty 10

## 2021-03-02 MED ORDER — TRAZODONE HCL 50 MG PO TABS
50.0000 mg | ORAL_TABLET | Freq: Every evening | ORAL | Status: DC | PRN
Start: 2021-03-02 — End: 2021-03-11
  Administered 2021-03-02 – 2021-03-10 (×9): 50 mg via ORAL
  Filled 2021-03-02 (×9): qty 1

## 2021-03-02 MED ORDER — KETOROLAC TROMETHAMINE 30 MG/ML IJ SOLN
30.0000 mg | Freq: Once | INTRAMUSCULAR | Status: AC
  Administered 2021-03-02: 30 mg via INTRAVENOUS
  Filled 2021-03-02: qty 1

## 2021-03-02 MED ORDER — DIPHENHYDRAMINE HCL 50 MG/ML IJ SOLN
50.0000 mg | Freq: Once | INTRAMUSCULAR | Status: AC
  Administered 2021-03-02: 50 mg via INTRAVENOUS
  Filled 2021-03-02: qty 1

## 2021-03-02 MED ORDER — METOCLOPRAMIDE HCL 5 MG/ML IJ SOLN
10.0000 mg | Freq: Once | INTRAMUSCULAR | Status: AC
Start: 2021-03-02 — End: 2021-03-02
  Administered 2021-03-02: 10 mg via INTRAVENOUS
  Filled 2021-03-02: qty 2

## 2021-03-02 MED ORDER — ONDANSETRON HCL 4 MG PO TABS
4.0000 mg | ORAL_TABLET | Freq: Four times a day (QID) | ORAL | Status: DC | PRN
  Administered 2021-03-03 – 2021-03-10 (×6): 4 mg via ORAL
  Filled 2021-03-02 (×6): qty 1

## 2021-03-02 MED ORDER — ENOXAPARIN SODIUM 30 MG/0.3ML ~~LOC~~ SOLN
30.0000 mg | SUBCUTANEOUS | Status: DC
  Filled 2021-03-02: qty 0.3

## 2021-03-02 MED ORDER — ACETAMINOPHEN 325 MG PO TABS
650.0000 mg | ORAL_TABLET | Freq: Four times a day (QID) | ORAL | Status: DC | PRN
  Administered 2021-03-05 – 2021-03-10 (×9): 650 mg via ORAL
  Filled 2021-03-02 (×9): qty 2

## 2021-03-02 NOTE — ED Triage Notes (Signed)
Pt comes in with 7 days of a headache off an on with congestion. Pt in NAD, even breaths.

## 2021-03-02 NOTE — ED Notes (Signed)
Patient's mother at bedside notified of policies for visiting hours. Patient transported to inpatient unit by [facility], RN.

## 2021-03-02 NOTE — ED Notes (Signed)
Patient was given an ED sandwich tray and ginger ale. Lab was called to inform about UDS ordered and to request a lab tech to draw the admission labs.

## 2021-03-02 NOTE — ED Provider Notes (Signed)
[facility]-EMERGENCY DEPARTMENT  ____________________________________________  Time seen: Approximately 4:07 PM  I have reviewed the triage vital signs and the nursing notes.   HISTORY  Chief Complaint Migraine   Historian Patient     HPI Jerome Mccormick is a 30 y.o. male with a history of headaches, presents to the emergency department with a migraine that has lasted for 7 days.  Patient states that headache came on slowly and progressed in intensity slowly.  Patient states that he has had nausea and vomiting which he associated with his headache.  Patient is unsure if he has had fever but does endorse some nasal congestion.  He states that this is the worst headache he has ever had and has not been relieved with Tylenol or ibuprofen.  He denies falls or mechanisms of trauma.  He has had some photophobia but no phonophobia.  Denies a history of renal issues in the past.   Past Medical History:  Diagnosis Date  . Allergy   . Headache      Immunizations up to date:  Yes.     Past Medical History:  Diagnosis Date  . Allergy   . Headache     Patient Active Problem List   Diagnosis Date Noted  . AKI (acute kidney injury) (HCC) 03/02/2021  . Routine general medical examination at a health care facility 02/23/2014    History reviewed. No pertinent surgical history.  Prior to Admission medications   Medication Sig Start Date End Date Taking? Authorizing Provider  HYDROcodone-acetaminophen (NORCO) 5-325 MG tablet Take 1 tablet by mouth 3 (three) times daily as needed. 01/10/21   Menshew, Charlesetta Ivory, PA-C    Allergies Patient has no known allergies.  History reviewed. No pertinent family history.  Social History Social History   Tobacco Use  . Smoking status: Current Every Day Smoker    Packs/day: 0.75    Types: Cigarettes  . Smokeless tobacco: Never Used  Substance Use Topics  . Alcohol use: Yes    Comment: >100 oz per week - beer  . Drug use: Yes    Types:  Marijuana     Review of Systems  Constitutional: No fever/chills Eyes:  No discharge ENT: No upper respiratory complaints. Respiratory: no cough. No SOB/ use of accessory muscles to breath Gastrointestinal:   No nausea, no vomiting.  No diarrhea.  No constipation. Musculoskeletal: Negative for musculoskeletal pain. Neuro: Patient has headache.  Skin: Negative for rash, abrasions, lacerations, ecchymosis.    ____________________________________________   PHYSICAL EXAM:  VITAL SIGNS: ED Triage Vitals  Enc Vitals Group     BP 03/02/21 1534 (!) 117/91     Pulse Rate 03/02/21 1534 87     Resp 03/02/21 1534 18     Temp 03/02/21 1534 98.7 F (37.1 C)     Temp Source 03/02/21 1534 Oral     SpO2 03/02/21 1534 100 %     Weight 03/02/21 1533 138 lb (62.6 kg)     Height 03/02/21 1533 5\' 7"  (1.702 m)     Head Circumference --      Peak Flow --      Pain Score 03/02/21 1533 7     Pain Loc --      Pain Edu? --      Excl. in GC? --      Constitutional: Alert and oriented. Well appearing and in no acute distress. Eyes: Conjunctivae are normal. PERRL. EOMI. Head: Atraumatic. ENT:      Ears: TMs  are pearly.       Nose: No congestion/rhinnorhea.      Mouth/Throat: Mucous membranes are moist.  Neck: No stridor.  FROM.  Cardiovascular: Normal rate, regular rhythm. Normal S1 and S2.  Good peripheral circulation. Respiratory: Normal respiratory effort without tachypnea or retractions. Lungs CTAB. Good air entry to the bases with no decreased or absent breath sounds Gastrointestinal: Bowel sounds x 4 quadrants. Soft and nontender to palpation. No guarding or rigidity. No distention. Musculoskeletal: Full range of motion to all extremities. No obvious deformities noted Neurologic:  Normal for age. No gross focal neurologic deficits are appreciated.  Skin:  Skin is warm, dry and intact. No rash noted. Psychiatric: Mood and affect are normal for age. Speech and behavior are normal.    ____________________________________________   LABS (all labs ordered are listed, but only abnormal results are displayed)  Labs Reviewed  CBC WITH DIFFERENTIAL/PLATELET - Abnormal; Notable for the following components:      Result Value   RBC 3.12 (*)    Hemoglobin 8.8 (*)    HCT 27.5 (*)    All other components within normal limits  COMPREHENSIVE METABOLIC PANEL - Abnormal; Notable for the following components:   Sodium 130 (*)    BUN 75 (*)    Creatinine, Ser 4.31 (*)    Calcium 7.2 (*)    Albumin 2.7 (*)    GFR, Estimated 18 (*)    All other components within normal limits  URINALYSIS, COMPLETE (UACMP) WITH MICROSCOPIC - Abnormal; Notable for the following components:   Color, Urine AMBER (*)    APPearance CLOUDY (*)    Hgb urine dipstick LARGE (*)    Protein, ur >=300 (*)    Leukocytes,Ua MODERATE (*)    RBC / HPF >50 (*)    WBC, UA >50 (*)    All other components within normal limits  RESP PANEL BY RT-PCR (FLU A&B, COVID) ARPGX2  C3 COMPLEMENT  C4 COMPLEMENT  GLOMERULAR BASEMENT MEMBRANE ANTIBODIES  ANA W/REFLEX IF POSITIVE  ANCA TITERS  MPO/PR-3 (ANCA) ANTIBODIES  HEPATITIS B SURFACE ANTIGEN  HEPATITIS B SURFACE ANTIBODY,QUALITATIVE  HIV ANTIBODY (ROUTINE TESTING W REFLEX)  HEPATITIS B CORE ANTIBODY, TOTAL  HEPATITIS C ANTIBODY  PROTEIN ELECTROPHORESIS, SERUM  KAPPA/LAMBDA LIGHT CHAINS  MISC [facility] TEST (SEND OUT)  PROTEIN / CREATININE RATIO, URINE  URINE DRUG SCREEN, QUALITATIVE ([facility] ONLY)   ____________________________________________  EKG   ____________________________________________  RADIOLOGY Geraldo Pitter, personally viewed and evaluated these images (plain radiographs) as part of my medical decision making, as well as reviewing the written report by the radiologist.    CT Head Wo Contrast  Result Date: 03/02/2021 CLINICAL DATA:  Headache. EXAM: CT HEAD WITHOUT CONTRAST TECHNIQUE: Contiguous axial images were obtained from the base  of the skull through the vertex without intravenous contrast. COMPARISON:  12/15/2017. FINDINGS: Brain: No evidence of acute infarction, hemorrhage, hydrocephalus, extra-axial collection or mass lesion/mass effect. 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 Vascular: No hyperdense vessel identified. Skull: No acute fracture. Sinuses/Orbits: Minimal ethmoid air cell mucosal thickening. No air-fluid levels visualized. Unremarkable visualized orbits. Other: No mastoid effusion. IMPRESSION: No evidence of acute intracranial abnormality. Electronically Signed   By: Feliberto Harts MD   On: 03/02/2021 16:30   CT Renal Stone Study  Result Date: 03/02/2021 CLINICAL DATA:  Renal parenchymal disease with hematuria EXAM: CT ABDOMEN AND PELVIS WITHOUT CONTRAST TECHNIQUE: Multidetector CT imaging of the abdomen and pelvis was  performed following the standard protocol without IV contrast. COMPARISON:  07/02/2012 FINDINGS: Lower chest: No acute abnormality. Hepatobiliary: No focal hepatic abnormality. Gallbladder unremarkable. Pancreas: No focal abnormality or ductal dilatation. Spleen: No focal abnormality.  Normal size. Adrenals/Urinary Tract: No adrenal abnormality. No focal renal abnormality. No stones or hydronephrosis. Urinary bladder is unremarkable. Stomach/Bowel: Normal appendix. Stomach, large and small bowel grossly unremarkable. Vascular/Lymphatic: No evidence of aneurysm or adenopathy. Reproductive: No visible focal abnormality. Other: Small amount of free fluid noted adjacent to the liver and in the cul-de-sac. Musculoskeletal: No acute bony abnormality. IMPRESSION: No renal or ureteral stones.  No hydronephrosis. Small amount of free fluid in the abdomen and pelvis of unknown etiology. Electronically Signed   By: Charlett Nose M.D.   On: 03/02/2021 20:38    ____________________________________________    PROCEDURES  Procedure(s) performed:      Procedures     Medications  diphenhydrAMINE (BENADRYL) injection 50 mg (50 mg Intravenous Given 03/02/21 1639)  metoCLOPramide (REGLAN) injection 10 mg (10 mg Intravenous Given 03/02/21 1644)  ketorolac (TORADOL) 30 MG/ML injection 30 mg (30 mg Intravenous Given 03/02/21 1736)  sodium chloride 0.9 % bolus 1,000 mL (0 mLs Intravenous Stopped 03/02/21 2003)  sodium chloride 0.9 % bolus 1,000 mL (0 mLs Intravenous Stopped 03/02/21 2107)  cefTRIAXone (ROCEPHIN) 1 g in sodium chloride 0.9 % 100 mL IVPB (1 g Intravenous New Bag/Given 03/02/21 2109)     ____________________________________________   INITIAL IMPRESSION / ASSESSMENT AND PLAN / ED COURSE  Pertinent labs & imaging results that were available during my care of the patient were reviewed by me and considered in my medical decision making (see chart for details).      Assessment and Plan:  Headache:  30 year old male presents to the emergency department with vomiting and headache over the past 7 days.  Vital signs were reassuring at triage.  On physical exam, patient was alert, active and nontoxic-appearing.  He had no initial neuro deficits noted on exam.  Abdomen was soft and nontender without guarding.  He had no CVA tenderness.  Was initially concerned about atypical headache duration and CT head was obtained.  CT head showed no signs of intracranial bleed and migraine cocktail was given.  CBC and CMP return with concerning findings.  Creatinine was 4.3 and BUN was 75 with hyponatremia of 130.  Hemoglobin 8.8.  Patient denied any history of renal issues in the past.  Urinalysis showed a large amount of protein, blood and moderate leuks.  CT renal stone study showed no signs of nephrolithiasis or other acute abnormality.  I reviewed patient's case with my attending, Dr. Shaune Pollack we agreed to reach out to nephrology who recommended admission and extensive work-up for renal vasculitis.  Patient was given 2 L of normal  saline in the emergency department and Rocephin.  I reached out to hospitalist, Dr. Crissie Reese who accepted patient for admission.     ____________________________________________  FINAL CLINICAL IMPRESSION(S) / ED DIAGNOSES  Final diagnoses:  Acute kidney injury (HCC)      NEW MEDICATIONS STARTED DURING THIS VISIT:  ED Discharge Orders    None          This chart was dictated using voice recognition software/Dragon. Despite best efforts to proofread, errors can occur which can change the meaning. Any change was purely unintentional.     Gasper Lloyd 03/02/21 2213    Shaune Pollack, MD 03/06/21 [phone]

## 2021-03-02 NOTE — ED Notes (Signed)
Lab at bedside for specimen collection.

## 2021-03-02 NOTE — H&P (Signed)
[facility] History and Physical  Jerome Mccormick:[id number] DOB: [birth year] DOA: 03/02/2021  Referring physician: Dr. Erma Heritage PCP: Patient, No Pcp Per (Inactive)   Chief Complaint: headaches  HPI: Jerome Mccormick is a 30 y.o. male with no past medical history who presents for headaches.  Patient reports that for about the past week he has been having significant migraines.  He has had migraines in the past however these have persisted for longer than he usually after prior.  He has also had associated nausea and vomiting.  His mother eventually convinced him to present to the ED for evaluation.  On further history patient reports that he is also had significantly increased nasal congestion as well has nosebleeds.  Does not usually get nosebleeds, has not had any active bleeding from the nose but is having frequent bloody mucus.  On questioning admits that about 2 to 3 weeks ago he had intermittent gross hematuria in his urine but has not had any recently.  Reports that he does have swelling, feels that his face is swollen and does not look like it usually does, in addition feels like he has very swollen legs.  Endorses having had a rash, still mildly present on his legs but was much worse previously.  He reports he smokes about a half a pack a day.  His father had hypertension and diabetes, passed away from complications due to end-stage renal disease on dialysis.  No other family history of kidney problems.  In the ED initial vital signs unremarkable and notable for no hypertension.  Initial management and treatment focused on evaluation of headaches, he was given Benadryl, Toradol, Reglan, as well as IV fluids and sent for CT scan of the head which was unremarkable.  Baseline labs subsequently returned with unexpected results of CMP with creatinine of 4.3 and BUN of 75, sodium 130, albumin 2.7, remainder unremarkable.  CBC showed anemia with hemoglobin of 8.8 but otherwise unremarkable.  UA  notable for greater than 50 RBC and WBCs as well as large amount of protein.  UDS positive for cannabinoids and tricyclics.  CT renal stone study was obtained and showed unremarkable kidneys and a small amount of free fluid in the pelvis of unclear etiology.  Nephrology was contacted and recommended work-up for possible vasculitis.  Patient was admitted for further work-up and management.  Review of Systems:  Pertinent positives and negative per HPI, all others reviewed and negative  Past Medical History:  Diagnosis Date  . Allergy   . Headache    History reviewed. No pertinent surgical history. Social History:  reports that he has been smoking cigarettes. He has been smoking about 0.75 packs per day. He has never used smokeless tobacco. He reports current alcohol use. He reports current drug use. Drug: Marijuana.  No Known Allergies  History reviewed. No pertinent family history.   Prior to Admission medications   Medication Sig Start Date End Date Taking? Authorizing Provider  HYDROcodone-acetaminophen (NORCO) 5-325 MG tablet Take 1 tablet by mouth 3 (three) times daily as needed. 01/10/21   Menshew, Charlesetta Ivory, PA-C   Physical Exam: Vitals:   03/02/21 1900 03/02/21 1930 03/02/21 2030 03/02/21 2100  BP: 123/73 123/75 125/79 114/73  Pulse: 89 81 74 77  Resp:      Temp:      TempSrc:      SpO2: 100% 100% 98% 100%  Weight:      Height:        Wt Readings  from Last 3 Encounters:  03/02/21 62.6 kg  12/15/17 65.8 kg  02/23/14 74.4 kg     . General:  Appears calm and comfortable . Eyes: PERRL, normal lids, irises & conjunctiva . ENT: grossly normal hearing, lips & tongue. MMM. Nasal turbinates erythematous and edematous bilaterally. Some bright red blood visible on nasal speculum exam on turbinates bilaterally. . Cardiovascular: RRR. Holosystolic 2/6 murmur best heard at LLSB and systolic 2/6 murmur heard best at LUSB. 2+ pitting LE edema bilaterally. Marland Kitchen Respiratory: CTA  bilaterally, no w/r/r. Normal respiratory effort. . Abdomen: soft, ntnd . Skin: faint macular rash present on bilateral shins, see picture . Musculoskeletal: grossly normal tone BUE/BLE . Psychiatric: anxious affect, speech fluent and appropriate . Neurologic: grossly non-focal.              Labs on Admission:  Basic Metabolic Panel: Recent Labs  Lab 03/02/21 1631  NA 130*  K 4.3  CL 98  CO2 24  GLUCOSE 97  BUN 75*  CREATININE 4.31*  CALCIUM 7.2*   Liver Function Tests: Recent Labs  Lab 03/02/21 1631  AST 15  ALT 12  ALKPHOS 46  BILITOT 0.4  PROT 6.5  ALBUMIN 2.7*   No results for input(s): LIPASE, AMYLASE in the last 168 hours. No results for input(s): AMMONIA in the last 168 hours. CBC: Recent Labs  Lab 03/02/21 1631  WBC 7.6  NEUTROABS 5.7  HGB 8.8*  HCT 27.5*  MCV 88.1  PLT 309   Cardiac Enzymes: No results for input(s): CKTOTAL, CKMB, CKMBINDEX, TROPONINI in the last 168 hours.  BNP (last 3 results) No results for input(s): BNP in the last 8760 hours.  ProBNP (last 3 results) No results for input(s): PROBNP in the last 8760 hours.  CBG: No results for input(s): GLUCAP in the last 168 hours.  Radiological Exams on Admission: CT Head Wo Contrast  Result Date: 03/02/2021 CLINICAL DATA:  Headache. EXAM: CT HEAD WITHOUT CONTRAST TECHNIQUE: Contiguous axial images were obtained from the base of the skull through the vertex without intravenous contrast. COMPARISON:  12/15/2017. FINDINGS: Brain: No evidence of acute infarction, hemorrhage, hydrocephalus, extra-axial collection or mass lesion/mass effect. 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 Vascular: No hyperdense vessel identified. Skull: No acute fracture. Sinuses/Orbits: Minimal ethmoid air cell mucosal thickening. No air-fluid levels visualized. Unremarkable visualized orbits. Other: No mastoid effusion. IMPRESSION: No evidence of acute intracranial  abnormality. Electronically Signed   By: Feliberto Harts MD   On: 03/02/2021 16:30   CT Renal Stone Study  Result Date: 03/02/2021 CLINICAL DATA:  Renal parenchymal disease with hematuria EXAM: CT ABDOMEN AND PELVIS WITHOUT CONTRAST TECHNIQUE: Multidetector CT imaging of the abdomen and pelvis was performed following the standard protocol without IV contrast. COMPARISON:  07/02/2012 FINDINGS: Lower chest: No acute abnormality. Hepatobiliary: No focal hepatic abnormality. Gallbladder unremarkable. Pancreas: No focal abnormality or ductal dilatation. Spleen: No focal abnormality.  Normal size. Adrenals/Urinary Tract: No adrenal abnormality. No focal renal abnormality. No stones or hydronephrosis. Urinary bladder is unremarkable. Stomach/Bowel: Normal appendix. Stomach, large and small bowel grossly unremarkable. Vascular/Lymphatic: No evidence of aneurysm or adenopathy. Reproductive: No visible focal abnormality. Other: Small amount of free fluid noted adjacent to the liver and in the cul-de-sac. Musculoskeletal: No acute bony abnormality. IMPRESSION: No renal or ureteral stones.  No hydronephrosis. Small amount of free fluid in the abdomen and pelvis of unknown etiology. Electronically Signed   By: Charlett Nose M.D.   On:  03/02/2021 20:38    EKG: Not obtained  Assessment/Plan Active Problems:   AKI (acute kidney injury) (HCC)   #AKI #Hematuria #Proteinuria Patient presenting with AKI and combination of nephrotic and nephritic features.  Nephrology are following and are concerned for vasculitis.  No clear syndrome on history, smoking raises possibility of Goodpasture's, nosebleeds raise possibility of Churg-Strauss the lack of eosinophils make this very unlikely, and lower extremity rash and swelling raise possibility of HSP/IgA vasculitis.  Extensive laboratory work-up has been collected.  Discussed with patient that he should be prepared for possible renal biopsy, though further discussion will be  deferred until nephrology has evaluated the patient. -Follow-up laboratory evaluation -Follow-up nephrology recommendations in a.m.  #Anemia Likely related to kidney injury, and blood loss from underlying vasculitis. -Trend CBC -Check iron studies and ferritin  #Heart murmur Systolic heart murmur heard on exam, another unexpected finding.  Unclear if there is any relationship with under lying vasculitis, will obtain TTE to further evaluate.  Code Status: Full code DVT Prophylaxis: Reduced dose Lovenox Family Communication: Mother updated at bedside Disposition Plan: Inpatient, MedSurg  Time spent: 10 min  Venora Maples MD/[facility]  Note:  This document was prepared using Conservation officer, historic buildings and may include unintentional dictation errors.

## 2021-03-02 NOTE — ED Notes (Addendum)
Patient is at imaging.  Mother at bedside.

## 2021-03-03 ENCOUNTER — Inpatient Hospital Stay (HOSPITAL_COMMUNITY)
Admit: 2021-03-03 | Discharge: 2021-03-03 | Disposition: A | Discharge status: Home / Self Care | Payer: Medicaid Other | Attending: Family Medicine | Admitting: Family Medicine

## 2021-03-03 DIAGNOSIS — D649 Anemia, unspecified: Secondary | ICD-10-CM

## 2021-03-03 DIAGNOSIS — R011 Cardiac murmur, unspecified: Secondary | ICD-10-CM

## 2021-03-03 LAB — PROTEIN / CREATININE RATIO, URINE
Creatinine, Urine: 164 mg/dL
Protein Creatinine Ratio: 2.45 mg/mg{Cre} — ABNORMAL HIGH (ref 0.00–0.15)
Total Protein, Urine: 402 mg/dL

## 2021-03-03 LAB — CBC
HCT: 28.6 % — ABNORMAL LOW (ref 39.0–52.0)
Hemoglobin: 9.2 g/dL — ABNORMAL LOW (ref 13.0–17.0)
MCH: 28 pg (ref 26.0–34.0)
MCHC: 32.2 g/dL (ref 30.0–36.0)
MCV: 86.9 fL (ref 80.0–100.0)
Platelets: 319 10*3/uL (ref 150–400)
RBC: 3.29 MIL/uL — ABNORMAL LOW (ref 4.22–5.81)
RDW: 13.2 % (ref 11.5–15.5)
WBC: 9.5 10*3/uL (ref 4.0–10.5)
nRBC: 0 % (ref 0.0–0.2)

## 2021-03-03 LAB — IRON AND TIBC
Iron: 14 ug/dL — ABNORMAL LOW (ref 45–182)
Saturation Ratios: 6 % — ABNORMAL LOW (ref 17.9–39.5)
TIBC: 220 ug/dL — ABNORMAL LOW (ref 250–450)
UIBC: 206 ug/dL

## 2021-03-03 LAB — HEPATITIS C ANTIBODY: HCV Ab: NONREACTIVE

## 2021-03-03 LAB — BASIC METABOLIC PANEL
Anion gap: 9 (ref 5–15)
BUN: 75 mg/dL — ABNORMAL HIGH (ref 6–20)
CO2: 21 mmol/L — ABNORMAL LOW (ref 22–32)
Calcium: 7.1 mg/dL — ABNORMAL LOW (ref 8.9–10.3)
Chloride: 103 mmol/L (ref 98–111)
Creatinine, Ser: 4.1 mg/dL — ABNORMAL HIGH (ref 0.61–1.24)
GFR, Estimated: 19 mL/min — ABNORMAL LOW (ref >60)
Glucose, Bld: 115 mg/dL — ABNORMAL HIGH (ref 70–99)
Potassium: 4.4 mmol/L (ref 3.5–5.1)
Sodium: 133 mmol/L — ABNORMAL LOW (ref 135–145)

## 2021-03-03 LAB — FERRITIN: Ferritin: 158 ng/mL (ref 24–336)

## 2021-03-03 LAB — ECHOCARDIOGRAM COMPLETE
AR max vel: 2.95 cm2
AV Peak grad: 7.2 mmHg
Ao pk vel: 1.34 m/s
Area-P 1/2: 3.02 cm2
Height: 67 in
S' Lateral: 3.55 cm
Weight: 2208 oz

## 2021-03-03 LAB — HEPATITIS B SURFACE ANTIBODY,QUALITATIVE: Hep B S Ab: NONREACTIVE

## 2021-03-03 LAB — HEPATITIS B CORE ANTIBODY, TOTAL: Hep B Core Total Ab: NONREACTIVE

## 2021-03-03 LAB — HEPATITIS B SURFACE ANTIGEN: Hepatitis B Surface Ag: NONREACTIVE

## 2021-03-03 LAB — HIV ANTIBODY (ROUTINE TESTING W REFLEX): HIV Screen 4th Generation wRfx: NONREACTIVE

## 2021-03-03 MED ORDER — LORAZEPAM 1 MG PO TABS: 1.0000 mg | ORAL_TABLET | ORAL | Status: AC | PRN

## 2021-03-03 MED ORDER — FOLIC ACID 1 MG PO TABS
1.0000 mg | ORAL_TABLET | Freq: Every day | ORAL | Status: DC
  Administered 2021-03-03 – 2021-03-11 (×8): 1 mg via ORAL
  Filled 2021-03-03 (×8): qty 1

## 2021-03-03 MED ORDER — LORAZEPAM 2 MG/ML IJ SOLN: 1.0000 mg | INTRAMUSCULAR | Status: AC | PRN

## 2021-03-03 MED ORDER — NICOTINE 14 MG/24HR TD PT24
14.0000 mg | MEDICATED_PATCH | Freq: Every day | TRANSDERMAL | Status: DC
  Administered 2021-03-03 – 2021-03-11 (×9): 14 mg via TRANSDERMAL
  Filled 2021-03-03 (×10): qty 1

## 2021-03-03 MED ORDER — ADULT MULTIVITAMIN W/MINERALS CH
1.0000 | ORAL_TABLET | Freq: Every day | ORAL | Status: DC
  Administered 2021-03-03 – 2021-03-11 (×8): 1 via ORAL
  Filled 2021-03-03 (×8): qty 1

## 2021-03-03 MED ORDER — THIAMINE HCL 100 MG/ML IJ SOLN: 100.0000 mg | Freq: Every day | INTRAMUSCULAR | Status: DC

## 2021-03-03 MED ORDER — THIAMINE HCL 100 MG PO TABS
100.0000 mg | ORAL_TABLET | Freq: Every day | ORAL | Status: DC
  Administered 2021-03-03 – 2021-03-11 (×8): 100 mg via ORAL
  Filled 2021-03-03 (×9): qty 1

## 2021-03-03 NOTE — Consult Note (Signed)
[facility]  Date of Admission:  03/02/2021           Reason for Consult:  AKI, Hematuria   Referring Provider: Narda Bonds, MD Primary Care Provider: Patient, No Pcp Per (Inactive)   History of Presenting Illness:  Jerome Mccormick is a 30 y.o. male     Information is provided by patient and his mother.  Patient comes into the ER with 7 days of headache off and on with congestion.  He states he has been taking BCs, Goody's and Advil's to manage headaches.  Normally he takes multiple Advil PMs which help him sleep.  He usually takes 3-4 a day.  He has been taking them for many years now to help him sleep He had a mild rash on the back of his calves but that has resolved completely. He also reports a little tinge of blood in the sputum with harsh coughing. Otherwise no shortness of breath, no leg edema.  No previous history of heart or lung problems.  No blood in the stool or urine that he has noticed. No joint pains. He has extensive history of alcohol use Reports drinking about 30 ounces daily of beer but his mother thinks he may be drinking more.  Denies use of liquor or moonshine recently.  He also smokes about a pack a day. He works at [employer] His father was on dialysis 20 years ago.  He died at the age of 59.  He was poorly controlled diabetic.    Review of Systems: Review of Systems  Constitutional: Positive for malaise/fatigue and weight loss. Negative for chills and fever.  HENT: Negative.   Eyes: Negative.   Respiratory: Positive for shortness of breath. Negative for cough and hemoptysis.   Cardiovascular: Positive for PND. Negative for chest pain, palpitations and leg swelling.  Gastrointestinal: Negative for diarrhea, nausea and vomiting.  Genitourinary: Negative.   Musculoskeletal: Negative.   Skin: Negative for rash.  Neurological: Positive for weakness and headaches.  Endo/Heme/Allergies: Negative.     Past Medical History:  Diagnosis  Date  . Allergy   . Headache     Social History   Tobacco Use  . Smoking status: Current Every Day Smoker    Packs/day: 0.75    Types: Cigarettes  . Smokeless tobacco: Never Used  Substance Use Topics  . Alcohol use: Yes    Comment: >100 oz per week - beer  . Drug use: Yes    Types: Marijuana    History reviewed. No pertinent family history.   OBJECTIVE: Blood pressure 114/79, pulse 88, temperature 98.1 F (36.7 C), temperature source Oral, resp. rate 18, height 5\' 7"  (1.702 m), weight 62.6 kg, SpO2 100 %.  Physical Exam: General:  No acute distress, laying in the bed  HEENT  anicteric, moist oral mucous membrane  Pulm/lungs  normal breathing effort, lungs are clear to auscultation  CVS/Heart  regular rhythm, no rub or gallop  Abdomen:   Soft, nontender  Extremities:  No peripheral edema  Neurologic:  Alert, oriented, able to follow commands  Skin:  No acute rashes     Lab Results Lab Results  Component Value Date   WBC 9.5 03/03/2021   HGB 9.2 (L) 03/03/2021   HCT 28.6 (L) 03/03/2021   MCV 86.9 03/03/2021   PLT 319 03/03/2021    Lab Results  Component Value Date   CREATININE 4.10 (H) 03/03/2021   BUN 75 (H) 03/03/2021   NA 133 (L) 03/03/2021  K 4.4 03/03/2021   CL 103 03/03/2021   CO2 21 (L) 03/03/2021    Lab Results  Component Value Date   ALT 12 03/02/2021   AST 15 03/02/2021   ALKPHOS 46 03/02/2021   BILITOT 0.4 03/02/2021     Microbiology: Recent Results (from the past 240 hour(s))  Resp Panel by RT-PCR (Flu A&B, Covid) Nasopharyngeal Swab     Status: None   Collection Time: 03/02/21  4:31 PM   Specimen: Nasopharyngeal Swab; Nasopharyngeal(NP) swabs in vial transport medium  Result Value Ref Range Status   SARS Coronavirus 2 by RT PCR NEGATIVE NEGATIVE Final    Comment: (NOTE) SARS-CoV-2 target nucleic acids are NOT DETECTED.  The SARS-CoV-2 RNA is generally detectable in upper respiratory specimens during the acute phase of infection.  The lowest concentration of SARS-CoV-2 viral copies this assay can detect is 138 copies/mL. A negative result does not preclude SARS-Cov-2 infection and should not be used as the sole basis for treatment or other patient management decisions. A negative result may occur with  improper specimen collection/handling, submission of specimen other than nasopharyngeal swab, presence of viral mutation(s) within the areas targeted by this assay, and inadequate number of viral copies(<138 copies/mL). A negative result must be combined with clinical observations, patient history, and epidemiological information. The expected result is Negative.  Fact Sheet for Patients:  BloggerCourse.com  Fact Sheet for Healthcare Providers:  SeriousBroker.it  This test is no t yet approved or cleared by the [place] FDA and  has been authorized for detection and/or diagnosis of SARS-CoV-2 by FDA under an Emergency Use Authorization (EUA). This EUA will remain  in effect (meaning this test can be used) for the duration of the COVID-19 declaration under Section 564(b)(1) of the Act, 21 U.S.C.section 360bbb-3(b)(1), unless the authorization is terminated  or revoked sooner.       Influenza A by PCR NEGATIVE NEGATIVE Final   Influenza B by PCR NEGATIVE NEGATIVE Final    Comment: (NOTE) The Xpert Xpress SARS-CoV-2/FLU/RSV plus assay is intended as an aid in the diagnosis of influenza from Nasopharyngeal swab specimens and should not be used as a sole basis for treatment. Nasal washings and aspirates are unacceptable for Xpert Xpress SARS-CoV-2/FLU/RSV testing.  Fact Sheet for Patients: BloggerCourse.com  Fact Sheet for Healthcare Providers: SeriousBroker.it  This test is not yet approved or cleared by the [place] FDA and has been authorized for detection and/or diagnosis of SARS-CoV-2 by FDA  under an Emergency Use Authorization (EUA). This EUA will remain in effect (meaning this test can be used) for the duration of the COVID-19 declaration under Section 564(b)(1) of the Act, 21 U.S.C. section 360bbb-3(b)(1), unless the authorization is terminated or revoked.  Performed at [facility], [address] [place], [place] [address]     Medications: Scheduled Meds: Continuous Infusions: . methocarbamol (ROBAXIN) IV     PRN Meds:.acetaminophen **OR** acetaminophen, methocarbamol (ROBAXIN) IV, ondansetron **OR** ondansetron (ZOFRAN) IV, polyethylene glycol, traZODone  No Known Allergies  Urinalysis: Recent Labs    03/02/21 1905  COLORURINE AMBER*  LABSPEC 1.012  PHURINE 5.0  GLUCOSEU NEGATIVE  HGBUR LARGE*  BILIRUBINUR NEGATIVE  KETONESUR NEGATIVE  PROTEINUR >=300*  NITRITE NEGATIVE  LEUKOCYTESUR MODERATE*      Imaging: CT Head Wo Contrast  Result Date: 03/02/2021 CLINICAL DATA:  Headache. EXAM: CT HEAD WITHOUT CONTRAST TECHNIQUE: Contiguous axial images were obtained from the base of the skull through the vertex without intravenous contrast. COMPARISON:  12/15/2017. FINDINGS:  Brain: No evidence of acute infarction, hemorrhage, hydrocephalus, extra-axial collection or mass lesion/mass effect. 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 Vascular: No hyperdense vessel identified. Skull: No acute fracture. Sinuses/Orbits: Minimal ethmoid air cell mucosal thickening. No air-fluid levels visualized. Unremarkable visualized orbits. Other: No mastoid effusion. IMPRESSION: No evidence of acute intracranial abnormality. Electronically Signed   By: Feliberto Harts MD   On: 03/02/2021 16:30   CT Renal Stone Study  Result Date: 03/02/2021 CLINICAL DATA:  Renal parenchymal disease with hematuria EXAM: CT ABDOMEN AND PELVIS WITHOUT CONTRAST TECHNIQUE: Multidetector CT imaging of the abdomen and pelvis was performed following the  standard protocol without IV contrast. COMPARISON:  07/02/2012 FINDINGS: Lower chest: No acute abnormality. Hepatobiliary: No focal hepatic abnormality. Gallbladder unremarkable. Pancreas: No focal abnormality or ductal dilatation. Spleen: No focal abnormality.  Normal size. Adrenals/Urinary Tract: No adrenal abnormality. No focal renal abnormality. No stones or hydronephrosis. Urinary bladder is unremarkable. Stomach/Bowel: Normal appendix. Stomach, large and small bowel grossly unremarkable. Vascular/Lymphatic: No evidence of aneurysm or adenopathy. Reproductive: No visible focal abnormality. Other: Small amount of free fluid noted adjacent to the liver and in the cul-de-sac. Musculoskeletal: No acute bony abnormality. IMPRESSION: No renal or ureteral stones.  No hydronephrosis. Small amount of free fluid in the abdomen and pelvis of unknown etiology. Electronically Signed   By: Charlett Nose M.D.   On: 03/02/2021 20:38      Assessment/Plan:  Jerome Mccormick is a 30 y.o. male with medical problems of headaches and heavy nonsteroidal use, alcohol abuse,  was admitted on 03/02/2021 for :  AKI (acute kidney injury) (HCC) [N17.9] Acute kidney injury (HCC) [N17.9]  #Acute kidney injury.  Baseline creatinine 0.96/GFR greater than 60 from April 2015 #Hematuria #Proteinuria #Headaches #Anemia, iron deficiency  CT abdomen stone study April 2: No stones identified on hydronephrosis. Urinalysis March 02, 2021: Greater than 300 proteinuria, greater than 50 RBCs, greater than 50 WBCs Urine protein to creatinine ratio 2.45 g UDS positive for cannabinoids, tricyclics 2D echo March 03, 2021: Results pending   Differential diagnosis includes AKI due to nonsteroidals with possibly underlying kidney disease from chronic use.  Possibility of glomerularnephritis.  CKD risk factors include family history of kidney disease (father), heavy nonsteroidal use  Plan: Recommended kidney biopsy to patient. Discussed risks  and benefits with patient and his mother.  They have agreed to proceed. Ordered kidney biopsy for Monday morning.  Keep patient n.p.o.  Avoid heparin or aspirin. Screening serologies have been ordered.  Results pending.  Electrolytes and volume status are acceptable at present No acute indication for dialysis at present  Patient would benefit from IV iron infusion after kidney biopsy is done.     Mario Coronado Thedore Mins 03/03/21

## 2021-03-03 NOTE — Progress Notes (Signed)
PROGRESS NOTE    KENLEY TROOP  ZYS:[id number] DOB: 1991/06/15 DOA: 03/02/2021 PCP: Patient, No Pcp Per (Inactive)   Brief Narrative: COULSON WEHNER is a 30 y.o. male with no known medical history presented secondary to headaches and was found to have evidence of AKI on admission. Nephrology consulted.   Assessment & Plan:   Active Problems:   AKI (acute kidney injury) ([place])   Anemia   AKI Hematuria Proteinuria Baseline creatinine of 0.9. Creatinine of 4.31 on admission. Unsure of etiology. Concern for possible vasculitis on admission. Other considerations are NSAIDs or glomerulonephritis. Nephrology consulted. ANA, anca antibodies, GBM antibodies, C3/C4 complement, hepatitis B/C, kappa/lambda light chains, SPEP, antistreptolysin O titer pending. It is possible patient may have CKD rather than AKI. -Nephrology recommendations: kidney biopsy for 4/4  Anemia Appears to be likely chronic. No recent baseline. Hemoglobin of 8.8 on admission. Iron panel significant for possible anemia of chronic disease vs combination with iron deficiency.  Heart murmur Transthoracic Echocardiogram ordered on admission and is pending.  History of alcohol abuse -CIWA   DVT prophylaxis: SCDs Code Status:   Code Status: Full Code Family Communication: None at bedside Disposition Plan: Discharge home likely in several days pending continued workup/management, nephrology recommendations   Consultants:   Nephrology  Procedures:   None  Antimicrobials:  None    Subjective: No concerns this morning.  Objective: Vitals:   03/02/21 2030 03/02/21 2100 03/02/21 2249 03/03/21 0507  BP: 125/79 114/73 119/62 114/79  Pulse: 74 77 76 88  Resp:   18   Temp:   97.6 F (36.4 C) 98.1 F (36.7 C)  TempSrc:   Oral Oral  SpO2: 98% 100% 99% 100%  Weight:      Height:        Intake/Output Summary (Last 24 hours) at 03/03/2021 1126 Last data filed at 03/03/2021 0300 Gross per 24 hour  Intake  2300 ml  Output --  Net 2300 ml   Filed Weights   03/02/21 1533  Weight: 62.6 kg    Examination:  General exam: Appears calm and comfortable Respiratory system: Clear to auscultation. Respiratory effort normal. Cardiovascular system: S1 & S2 heard, RRR. Gastrointestinal system: Abdomen is nondistended, soft and nontender. No organomegaly or masses felt. Normal bowel sounds heard. Central nervous system: Alert and oriented. No focal neurological deficits. Musculoskeletal: No edema. No calf tenderness Skin: No cyanosis. Psychiatry: Judgement and insight appear normal. Mood & affect appropriate.     Data Reviewed: I have personally reviewed following labs and imaging studies  CBC Lab Results  Component Value Date   WBC 9.5 03/03/2021   RBC 3.29 (L) 03/03/2021   HGB 9.2 (L) 03/03/2021   HCT 28.6 (L) 03/03/2021   MCV 86.9 03/03/2021   MCH 28.0 03/03/2021   PLT 319 03/03/2021   MCHC 32.2 03/03/2021   RDW 13.2 03/03/2021   LYMPHSABS 1.2 03/02/2021   MONOABS 0.7 03/02/2021   EOSABS 0.0 03/02/2021   BASOSABS 0.0 93/23/5573     Last metabolic panel Lab Results  Component Value Date   NA 133 (L) 03/03/2021   K 4.4 03/03/2021   CL 103 03/03/2021   CO2 21 (L) 03/03/2021   BUN 75 (H) 03/03/2021   CREATININE 4.10 (H) 03/03/2021   GLUCOSE 115 (H) 03/03/2021   GFRNONAA 19 (L) 03/03/2021   GFRAA >60 03/11/2014   CALCIUM 7.1 (L) 03/03/2021   PROT 6.5 03/02/2021   ALBUMIN 2.7 (L) 03/02/2021   BILITOT 0.4 03/02/2021  ALKPHOS 46 03/02/2021   AST 15 03/02/2021   ALT 12 03/02/2021   ANIONGAP 9 03/03/2021    CBG (last 3)  No results for input(s): GLUCAP in the last 72 hours.   GFR: Estimated Creatinine Clearance: 23.3 mL/min (A) (by C-G formula based on SCr of 4.1 mg/dL (H)).  Coagulation Profile: No results for input(s): INR, PROTIME in the last 168 hours.  Recent Results (from the past 240 hour(s))  Resp Panel by RT-PCR (Flu A&B, Covid) Nasopharyngeal Swab      Status: None   Collection Time: 03/02/21  4:31 PM   Specimen: Nasopharyngeal Swab; Nasopharyngeal(NP) swabs in vial transport medium  Result Value Ref Range Status   SARS Coronavirus 2 by RT PCR NEGATIVE NEGATIVE Final    Comment: (NOTE) SARS-CoV-2 target nucleic acids are NOT DETECTED.  The SARS-CoV-2 RNA is generally detectable in upper respiratory specimens during the acute phase of infection. The lowest concentration of SARS-CoV-2 viral copies this assay can detect is 138 copies/mL. A negative result does not preclude SARS-Cov-2 infection and should not be used as the sole basis for treatment or other patient management decisions. A negative result may occur with  improper specimen collection/handling, submission of specimen other than nasopharyngeal swab, presence of viral mutation(s) within the areas targeted by this assay, and inadequate number of viral copies(<138 copies/mL). A negative result must be combined with clinical observations, patient history, and epidemiological information. The expected result is Negative.  Fact Sheet for Patients:  EntrepreneurPulse.com.au  Fact Sheet for Healthcare Providers:  IncredibleEmployment.be  This test is no t yet approved or cleared by the [place] FDA and  has been authorized for detection and/or diagnosis of SARS-CoV-2 by FDA under an Emergency Use Authorization (EUA). This EUA will remain  in effect (meaning this test can be used) for the duration of the COVID-19 declaration under Section 564(b)(1) of the Act, 21 U.S.C.section 360bbb-3(b)(1), unless the authorization is terminated  or revoked sooner.       Influenza A by PCR NEGATIVE NEGATIVE Final   Influenza B by PCR NEGATIVE NEGATIVE Final    Comment: (NOTE) The Xpert Xpress SARS-CoV-2/FLU/RSV plus assay is intended as an aid in the diagnosis of influenza from Nasopharyngeal swab specimens and should not be used as a sole basis  for treatment. Nasal washings and aspirates are unacceptable for Xpert Xpress SARS-CoV-2/FLU/RSV testing.  Fact Sheet for Patients: EntrepreneurPulse.com.au  Fact Sheet for Healthcare Providers: IncredibleEmployment.be  This test is not yet approved or cleared by the [place] FDA and has been authorized for detection and/or diagnosis of SARS-CoV-2 by FDA under an Emergency Use Authorization (EUA). This EUA will remain in effect (meaning this test can be used) for the duration of the COVID-19 declaration under Section 564(b)(1) of the Act, 21 U.S.C. section 360bbb-3(b)(1), unless the authorization is terminated or revoked.  Performed at [facility], [address] [place], [place] [address]         Radiology Studies: CT Head Wo Contrast  Result Date: 03/02/2021 CLINICAL DATA:  Headache. EXAM: CT HEAD WITHOUT CONTRAST TECHNIQUE: Contiguous axial images were obtained from the base of the skull through the vertex without intravenous contrast. COMPARISON:  12/15/2017. FINDINGS: Brain: No evidence of acute infarction, hemorrhage, hydrocephalus, extra-axial collection or mass lesion/mass effect. 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 Vascular: No hyperdense vessel identified. Skull: No acute fracture. Sinuses/Orbits: Minimal ethmoid air cell mucosal thickening. No air-fluid levels visualized. Unremarkable visualized orbits.  Other: No mastoid effusion. IMPRESSION: No evidence of acute intracranial abnormality. Electronically Signed   By: Margaretha Sheffield MD   On: 03/02/2021 16:30   CT Renal Stone Study  Result Date: 03/02/2021 CLINICAL DATA:  Renal parenchymal disease with hematuria EXAM: CT ABDOMEN AND PELVIS WITHOUT CONTRAST TECHNIQUE: Multidetector CT imaging of the abdomen and pelvis was performed following the standard protocol without IV contrast. COMPARISON:  07/02/2012 FINDINGS: Lower  chest: No acute abnormality. Hepatobiliary: No focal hepatic abnormality. Gallbladder unremarkable. Pancreas: No focal abnormality or ductal dilatation. Spleen: No focal abnormality.  Normal size. Adrenals/Urinary Tract: No adrenal abnormality. No focal renal abnormality. No stones or hydronephrosis. Urinary bladder is unremarkable. Stomach/Bowel: Normal appendix. Stomach, large and small bowel grossly unremarkable. Vascular/Lymphatic: No evidence of aneurysm or adenopathy. Reproductive: No visible focal abnormality. Other: Small amount of free fluid noted adjacent to the liver and in the cul-de-sac. Musculoskeletal: No acute bony abnormality. IMPRESSION: No renal or ureteral stones.  No hydronephrosis. Small amount of free fluid in the abdomen and pelvis of unknown etiology. Electronically Signed   By: Rolm Baptise M.D.   On: 03/02/2021 20:38     Scheduled Meds: . folic acid  1 mg Oral Daily  . multivitamin with minerals  1 tablet Oral Daily  . nicotine  14 mg Transdermal Daily  . thiamine  100 mg Oral Daily   Or  . thiamine  100 mg Intravenous Daily   Continuous Infusions: . methocarbamol (ROBAXIN) IV       LOS: 1 day     Cordelia Poche, MD [facility] 03/03/2021, 11:26 AM  If 7PM-7AM, please contact night-coverage [email or web address]

## 2021-03-03 NOTE — Progress Notes (Signed)
*  PRELIMINARY RESULTS* Echocardiogram 2D Echocardiogram has been performed.  Neita Garnet Katherine Tout 03/03/2021, 3:08 PM

## 2021-03-03 NOTE — TOC Initial Note (Signed)
Transition of Care [facility]) - Initial/Assessment Note    Patient Details  Name: Jerome Mccormick MRN: [id number] Date of Birth: 06-07-1991  Transition of Care [facility]) CM/SW Contact:    Jerome Cline, LCSW Phone Number: 03/03/2021, 1:26 PM  Clinical Narrative:                [facility] consult for SA resources. Patient is also uninsured. This CSW spoke with patient. Patient reported he does not have a PCP but does not feel he needs resources at this time. Patient also declined substance abuse resources. Patient reported he has reliable transportation. Patient denied TOC needs at this time.   Expected Discharge Plan: Home/Self Care Barriers to Discharge: Continued Medical Work up   Patient Goals and CMS Choice Patient states their goals for this hospitalization and ongoing recovery are:: to return home CMS Medicare.gov Compare Post Acute Care list provided to:: Patient Choice offered to / list presented to : Patient  Expected Discharge Plan and Services Expected Discharge Plan: Home/Self Care       Living arrangements for the past 2 months: Single Family Home                                      Prior Living Arrangements/Services Living arrangements for the past 2 months: Single Family Home   Patient language and need for interpreter reviewed:: Yes Do you feel safe going back to the place where you live?: Yes      Need for Family Participation in Patient Care: Yes (Comment) Care giver support system in place?: Yes (comment)   Criminal Activity/Legal Involvement Pertinent to Current Situation/Hospitalization: No - Comment as needed  Activities of Daily Living Home Assistive Devices/Equipment: None ADL Screening (condition at time of admission) Patient's cognitive ability adequate to safely complete daily activities?: Yes Is the patient deaf or have difficulty hearing?: No Does the patient have difficulty seeing, even when wearing glasses/contacts?: No Does the patient have  difficulty concentrating, remembering, or making decisions?: No Patient able to express need for assistance with ADLs?: Yes Does the patient have difficulty dressing or bathing?: No Independently performs ADLs?: Yes (appropriate for developmental age) Does the patient have difficulty walking or climbing stairs?: No Weakness of Legs: None Weakness of Arms/Hands: None  Permission Sought/Granted                  Emotional Assessment       Orientation: : Oriented to Self,Oriented to Place,Oriented to  Time,Oriented to Situation Alcohol / Substance Use: Not Applicable Psych Involvement: No (comment)  Admission diagnosis:  AKI (acute kidney injury) (HCC) [N17.9] Acute kidney injury (HCC) [N17.9] Patient Active Problem List   Diagnosis Date Noted  . AKI (acute kidney injury) (HCC) 03/02/2021  . Routine general medical examination at a health care facility 02/23/2014   PCP:  Patient, No Pcp Per (Inactive) Pharmacy:   [facility] [address] ([address]), [place] - [address] [place]) [place] [address] Phone: [phone] Fax: [phone]     Social Determinants of Health (SDOH) Interventions    Readmission Risk Interventions No flowsheet data found.

## 2021-03-04 ENCOUNTER — Inpatient Hospital Stay: Payer: Medicaid Other

## 2021-03-04 DIAGNOSIS — I340 Nonrheumatic mitral (valve) insufficiency: Secondary | ICD-10-CM

## 2021-03-04 DIAGNOSIS — I341 Nonrheumatic mitral (valve) prolapse: Secondary | ICD-10-CM

## 2021-03-04 LAB — RENAL FUNCTION PANEL
Albumin: 2.3 g/dL — ABNORMAL LOW (ref 3.5–5.0)
Anion gap: 9 (ref 5–15)
BUN: 73 mg/dL — ABNORMAL HIGH (ref 6–20)
CO2: 21 mmol/L — ABNORMAL LOW (ref 22–32)
Calcium: 7.4 mg/dL — ABNORMAL LOW (ref 8.9–10.3)
Chloride: 103 mmol/L (ref 98–111)
Creatinine, Ser: 4.35 mg/dL — ABNORMAL HIGH (ref 0.61–1.24)
GFR, Estimated: 18 mL/min — ABNORMAL LOW (ref >60)
Glucose, Bld: 106 mg/dL — ABNORMAL HIGH (ref 70–99)
Phosphorus: 5.6 mg/dL — ABNORMAL HIGH (ref 2.5–4.6)
Potassium: 4.2 mmol/L (ref 3.5–5.1)
Sodium: 133 mmol/L — ABNORMAL LOW (ref 135–145)

## 2021-03-04 LAB — ENA+DNA/DS+ANTICH+CENTRO+JO...
Anti JO-1: <0.2 AI (ref 0.0–0.9)
Centromere Ab Screen: <0.2 AI (ref 0.0–0.9)
Chromatin Ab SerPl-aCnc: <0.2 AI (ref 0.0–0.9)
ENA SM Ab Ser-aCnc: <0.2 AI (ref 0.0–0.9)
Ribonucleic Protein: 1.2 AI — ABNORMAL HIGH (ref 0.0–0.9)
SSA (Ro) (ENA) Antibody, IgG: <0.2 AI (ref 0.0–0.9)
SSB (La) (ENA) Antibody, IgG: <0.2 AI (ref 0.0–0.9)
Scleroderma (Scl-70) (ENA) Antibody, IgG: <0.2 AI (ref 0.0–0.9)
ds DNA Ab: <1 IU/mL (ref 0–9)

## 2021-03-04 LAB — CBC
HCT: 28 % — ABNORMAL LOW (ref 39.0–52.0)
Hemoglobin: 9 g/dL — ABNORMAL LOW (ref 13.0–17.0)
MCH: 28.1 pg (ref 26.0–34.0)
MCHC: 32.1 g/dL (ref 30.0–36.0)
MCV: 87.5 fL (ref 80.0–100.0)
Platelets: 295 10*3/uL (ref 150–400)
RBC: 3.2 MIL/uL — ABNORMAL LOW (ref 4.22–5.81)
RDW: 13.2 % (ref 11.5–15.5)
WBC: 7 10*3/uL (ref 4.0–10.5)
nRBC: 0 % (ref 0.0–0.2)

## 2021-03-04 LAB — ANA W/REFLEX IF POSITIVE: Anti Nuclear Antibody (ANA): POSITIVE — AB

## 2021-03-04 LAB — PROTIME-INR
INR: 1.2 (ref 0.8–1.2)
Prothrombin Time: 14.8 seconds (ref 11.4–15.2)

## 2021-03-04 MED ORDER — MIDAZOLAM HCL 2 MG/2ML IJ SOLN
INTRAMUSCULAR | Status: AC
  Filled 2021-03-04: qty 2

## 2021-03-04 MED ORDER — FENTANYL CITRATE (PF) 100 MCG/2ML IJ SOLN
INTRAMUSCULAR | Status: AC | PRN
  Administered 2021-03-04 (×2): 50 ug via INTRAVENOUS

## 2021-03-04 MED ORDER — FENTANYL CITRATE (PF) 100 MCG/2ML IJ SOLN
INTRAMUSCULAR | Status: AC
  Filled 2021-03-04: qty 2

## 2021-03-04 MED ORDER — MIDAZOLAM HCL 2 MG/2ML IJ SOLN
INTRAMUSCULAR | Status: AC | PRN
  Administered 2021-03-04 (×2): 1 mg via INTRAVENOUS

## 2021-03-04 NOTE — Progress Notes (Signed)
PROGRESS NOTE    Jerome Mccormick  UMP:[id number] DOB: 20-Sep-1991 DOA: 03/02/2021 PCP: Patient, No Pcp Per (Inactive)   Brief Narrative: Jerome Mccormick is a 30 y.o. male with no known medical history presented secondary to headaches and was found to have evidence of AKI on admission. Nephrology consulted.   Assessment & Plan:   Active Problems:   AKI (acute kidney injury) (HCC)   Anemia   AKI Hematuria Proteinuria Baseline creatinine of 0.9. Creatinine of 4.31 on admission. Unsure of etiology. Concern for possible vasculitis on admission. Other considerations are NSAIDs or glomerulonephritis. Nephrology consulted. ANA, anca antibodies, GBM antibodies, C3/C4 complement, hepatitis B/C, kappa/lambda light chains, SPEP, antistreptolysin O titer pending. It is possible patient may have CKD rather than AKI. -Nephrology recommendations: kidney biopsy for today  Anemia Appears to be likely chronic. No recent baseline. Hemoglobin of 8.8 on admission. Iron panel significant for possible anemia of chronic disease vs combination with iron deficiency. Stable.  Heart murmur Transthoracic Echocardiogram significant for a normal EF with no diastolic dysfunction. MV with possible prolapse and ? Flail leaflet and associate moderate MV regurgitation -Cardiology consult  History of alcohol abuse -CIWA   DVT prophylaxis: SCDs Code Status:   Code Status: Full Code Family Communication: None at bedside Disposition Plan: Discharge home likely in several days pending continued workup/management, nephrology recommendations   Consultants:   Nephrology  Procedures:   None  Antimicrobials:  None    Subjective: No issues overnight. Urinating well.  Objective: Vitals:   03/03/21 2025 03/03/21 2319 03/04/21 0511 03/04/21 0744  BP: 120/78 (!) 142/74 118/79 131/78  Pulse: 85 83 85 73  Resp: 18 18 18 16   Temp: 98.7 F (37.1 C) 98.5 F (36.9 C) 98.5 F (36.9 C) 98.3 F (36.8 C)   TempSrc:  Oral  Oral  SpO2: 100% 100% 98% 100%  Weight:      Height:        Intake/Output Summary (Last 24 hours) at 03/04/2021 1019 Last data filed at 03/03/2021 1901 Gross per 24 hour  Intake 360 ml  Output --  Net 360 ml   Filed Weights   03/02/21 1533  Weight: 62.6 kg    Examination:  General exam: Appears calm and comfortable  Respiratory system: Clear to auscultation. Respiratory effort normal. Cardiovascular system: S1 & S2 heard, RRR Gastrointestinal system: Abdomen is nondistended, soft and nontender. No organomegaly or masses felt. Normal bowel sounds heard. Central nervous system: Alert and oriented. No focal neurological deficits. Musculoskeletal: No edema. No calf tenderness Skin: No cyanosis. No rashes Psychiatry: Judgement and insight appear normal. Mood & affect appropriate.     Data Reviewed: I have personally reviewed following labs and imaging studies  CBC Lab Results  Component Value Date   WBC 7.0 03/04/2021   RBC 3.20 (L) 03/04/2021   HGB 9.0 (L) 03/04/2021   HCT 28.0 (L) 03/04/2021   MCV 87.5 03/04/2021   MCH 28.1 03/04/2021   PLT 295 03/04/2021   MCHC 32.1 03/04/2021   RDW 13.2 03/04/2021   LYMPHSABS 1.2 03/02/2021   MONOABS 0.7 03/02/2021   EOSABS 0.0 03/02/2021   BASOSABS 0.0 03/02/2021     Last metabolic panel Lab Results  Component Value Date   NA 133 (L) 03/04/2021   K 4.2 03/04/2021   CL 103 03/04/2021   CO2 21 (L) 03/04/2021   BUN 73 (H) 03/04/2021   CREATININE 4.35 (H) 03/04/2021   GLUCOSE 106 (H) 03/04/2021   GFRNONAA 18 (L)  03/04/2021   GFRAA >60 03/11/2014   CALCIUM 7.4 (L) 03/04/2021   PHOS 5.6 (H) 03/04/2021   PROT 6.5 03/02/2021   ALBUMIN 2.3 (L) 03/04/2021   BILITOT 0.4 03/02/2021   ALKPHOS 46 03/02/2021   AST 15 03/02/2021   ALT 12 03/02/2021   ANIONGAP 9 03/04/2021    CBG (last 3)  No results for input(s): GLUCAP in the last 72 hours.   GFR: Estimated Creatinine Clearance: 22 mL/min (A) (by C-G  formula based on SCr of 4.35 mg/dL (H)).  Coagulation Profile: Recent Labs  Lab 03/04/21 0633  INR 1.2    Recent Results (from the past 240 hour(s))  Resp Panel by RT-PCR (Flu A&B, Covid) Nasopharyngeal Swab     Status: None   Collection Time: 03/02/21  4:31 PM   Specimen: Nasopharyngeal Swab; Nasopharyngeal(NP) swabs in vial transport medium  Result Value Ref Range Status   SARS Coronavirus 2 by RT PCR NEGATIVE NEGATIVE Final    Comment: (NOTE) SARS-CoV-2 target nucleic acids are NOT DETECTED.  The SARS-CoV-2 RNA is generally detectable in upper respiratory specimens during the acute phase of infection. The lowest concentration of SARS-CoV-2 viral copies this assay can detect is 138 copies/mL. A negative result does not preclude SARS-Cov-2 infection and should not be used as the sole basis for treatment or other patient management decisions. A negative result may occur with  improper specimen collection/handling, submission of specimen other than nasopharyngeal swab, presence of viral mutation(s) within the areas targeted by this assay, and inadequate number of viral copies(<138 copies/mL). A negative result must be combined with clinical observations, patient history, and epidemiological information. The expected result is Negative.  Fact Sheet for Patients:  EntrepreneurPulse.com.au  Fact Sheet for Healthcare Providers:  IncredibleEmployment.be  This test is no t yet approved or cleared by the [place] FDA and  has been authorized for detection and/or diagnosis of SARS-CoV-2 by FDA under an Emergency Use Authorization (EUA). This EUA will remain  in effect (meaning this test can be used) for the duration of the COVID-19 declaration under Section 564(b)(1) of the Act, 21 U.S.C.section 360bbb-3(b)(1), unless the authorization is terminated  or revoked sooner.       Influenza A by PCR NEGATIVE NEGATIVE Final   Influenza B by PCR  NEGATIVE NEGATIVE Final    Comment: (NOTE) The Xpert Xpress SARS-CoV-2/FLU/RSV plus assay is intended as an aid in the diagnosis of influenza from Nasopharyngeal swab specimens and should not be used as a sole basis for treatment. Nasal washings and aspirates are unacceptable for Xpert Xpress SARS-CoV-2/FLU/RSV testing.  Fact Sheet for Patients: EntrepreneurPulse.com.au  Fact Sheet for Healthcare Providers: IncredibleEmployment.be  This test is not yet approved or cleared by the [place] FDA and has been authorized for detection and/or diagnosis of SARS-CoV-2 by FDA under an Emergency Use Authorization (EUA). This EUA will remain in effect (meaning this test can be used) for the duration of the COVID-19 declaration under Section 564(b)(1) of the Act, 21 U.S.C. section 360bbb-3(b)(1), unless the authorization is terminated or revoked.  Performed at [facility], [address], [place], [place] [address]         Radiology Studies: CT Head Wo Contrast  Result Date: 03/02/2021 CLINICAL DATA:  Headache. EXAM: CT HEAD WITHOUT CONTRAST TECHNIQUE: Contiguous axial images were obtained from the base of the skull through the vertex without intravenous contrast. COMPARISON:  12/15/2017. FINDINGS: Brain: No evidence of acute infarction, hemorrhage, hydrocephalus, extra-axial collection or mass lesion/mass effect.  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 Vascular: No hyperdense vessel identified. Skull: No acute fracture. Sinuses/Orbits: Minimal ethmoid air cell mucosal thickening. No air-fluid levels visualized. Unremarkable visualized orbits. Other: No mastoid effusion. IMPRESSION: No evidence of acute intracranial abnormality. Electronically Signed   By: Margaretha Sheffield MD   On: 03/02/2021 16:30   ECHOCARDIOGRAM COMPLETE  Result Date: 03/03/2021    ECHOCARDIOGRAM REPORT   Patient Name:   BARI HANDSHOE Date of Exam: 03/03/2021 Medical Rec #:  [id number]      Height:       67.0 in Accession #:    [id number]     Weight:       138.0 lb Date of Birth:  04-04-1991      BSA:          1.727 m Patient Age:    30 years       BP:           114/79 mmHg Patient Gender: M              HR:           88 bpm. Exam Location:  [facility] Procedure: 2D Echo, Cardiac Doppler and Color Doppler Indications:     Murmur R01.1  History:         Patient has no prior history of Echocardiogram examinations.  Sonographer:     Alyse Low Roar Referring Phys:  [id number] Clarnce Flock Diagnosing Phys: Ida Rogue MD IMPRESSIONS  1. Left ventricular ejection fraction, by estimation, is 60 to 65%. The left ventricle has normal function. The left ventricle has no regional wall motion abnormalities. Left ventricular diastolic parameters were normal.  2. Right ventricular systolic function is normal. The right ventricular size is normal. There is mildly elevated pulmonary artery systolic pressure. The estimated right ventricular systolic pressure is 54.2 mmHg.  3. Left atrial size was moderately dilated.  4. The mitral valve is abnormal.Posterior leaflet with prolapse, unable to exclude partially flail leaflet or torn chordea (images 30-32) Moderate eccentric mitral valve regurgitation. No evidence of mitral stenosis.  5. The inferior vena cava is dilated in size with >50% respiratory variability, suggesting right atrial pressure of 8 mmHg. FINDINGS  Left Ventricle: Left ventricular ejection fraction, by estimation, is 60 to 65%. The left ventricle has normal function. The left ventricle has no regional wall motion abnormalities. The left ventricular internal cavity size was normal in size. There is  no left ventricular hypertrophy. Left ventricular diastolic parameters were normal. Right Ventricle: The right ventricular size is normal. No increase in right ventricular wall thickness. Right ventricular systolic function is normal. There is mildly  elevated pulmonary artery systolic pressure. The tricuspid regurgitant velocity is 2.66  m/s, and with an assumed right atrial pressure of 10 mmHg, the estimated right ventricular systolic pressure is 70.6 mmHg. Left Atrium: Left atrial size was moderately dilated. Right Atrium: Right atrial size was normal in size. Pericardium: There is no evidence of pericardial effusion. Mitral Valve: The mitral valve is abnormal.Posterior leaflet with prolapse, unable to exclude partially flail leaflet or torn chordea (images 30-32)Moderate mitral valve regurgitation. No evidence of mitral valve stenosis. Tricuspid Valve: The tricuspid valve is normal in structure. Tricuspid valve regurgitation is mild . No evidence of tricuspid stenosis. Aortic Valve: The aortic valve is normal in structure. Aortic valve regurgitation is not visualized. No aortic stenosis is present. Aortic valve peak gradient measures 7.2 mmHg. Pulmonic Valve: The pulmonic  valve was normal in structure. Pulmonic valve regurgitation is not visualized. No evidence of pulmonic stenosis. Aorta: The aortic root is normal in size and structure. Venous: The inferior vena cava is dilated in size with greater than 50% respiratory variability, suggesting right atrial pressure of 8 mmHg. IAS/Shunts: No atrial level shunt detected by color flow Doppler.  LEFT VENTRICLE PLAX 2D LVIDd:         4.86 cm  Diastology LVIDs:         3.55 cm  LV e' medial:    12.40 cm/s LV PW:         1.10 cm  LV E/e' medial:  11.5 LV IVS:        0.95 cm  LV e' lateral:   17.30 cm/s LVOT diam:     2.00 cm  LV E/e' lateral: 8.2 LVOT Area:     3.14 cm  RIGHT VENTRICLE RV Mid diam:    3.43 cm RV S prime:     15.60 cm/s TAPSE (M-mode): 3.4 cm LEFT ATRIUM           Index       RIGHT ATRIUM           Index LA diam:      4.30 cm 2.49 cm/m  RA Area:     17.80 cm LA Vol (A2C): 97.9 ml 56.68 ml/m RA Volume:   50.50 ml  29.24 ml/m LA Vol (A4C): 52.4 ml 30.34 ml/m  AORTIC VALVE                PULMONIC  VALVE AV Area (Vmax): 2.95 cm    PV Vmax:        1.18 m/s AV Vmax:        134.00 cm/s PV Peak grad:   5.6 mmHg AV Peak Grad:   7.2 mmHg    RVOT Peak grad: 1 mmHg LVOT Vmax:      126.00 cm/s  AORTA Ao Root diam: 2.70 cm MITRAL VALVE                TRICUSPID VALVE MV Area (PHT): 3.02 cm     TR Peak grad:   28.3 mmHg MV Decel Time: 251 msec     TR Vmax:        266.00 cm/s MV E velocity: 142.00 cm/s MV A velocity: 103.00 cm/s  SHUNTS MV E/A ratio:  1.38         Systemic Diam: 2.00 cm MV A Prime:    10.4 cm/s Julien Nordmann MD Electronically signed by Julien Nordmann MD Signature Date/Time: 03/03/2021/6:08:05 PM    Final    CT Renal Stone Study  Result Date: 03/02/2021 CLINICAL DATA:  Renal parenchymal disease with hematuria EXAM: CT ABDOMEN AND PELVIS WITHOUT CONTRAST TECHNIQUE: Multidetector CT imaging of the abdomen and pelvis was performed following the standard protocol without IV contrast. COMPARISON:  07/02/2012 FINDINGS: Lower chest: No acute abnormality. Hepatobiliary: No focal hepatic abnormality. Gallbladder unremarkable. Pancreas: No focal abnormality or ductal dilatation. Spleen: No focal abnormality.  Normal size. Adrenals/Urinary Tract: No adrenal abnormality. No focal renal abnormality. No stones or hydronephrosis. Urinary bladder is unremarkable. Stomach/Bowel: Normal appendix. Stomach, large and small bowel grossly unremarkable. Vascular/Lymphatic: No evidence of aneurysm or adenopathy. Reproductive: No visible focal abnormality. Other: Small amount of free fluid noted adjacent to the liver and in the cul-de-sac. Musculoskeletal: No acute bony abnormality. IMPRESSION: No renal or ureteral stones.  No hydronephrosis. Small amount of free fluid in the  abdomen and pelvis of unknown etiology. Electronically Signed   By: Rolm Baptise M.D.   On: 03/02/2021 20:38     Scheduled Meds: . folic acid  1 mg Oral Daily  . multivitamin with minerals  1 tablet Oral Daily  . nicotine  14 mg Transdermal Daily   . thiamine  100 mg Oral Daily   Or  . thiamine  100 mg Intravenous Daily   Continuous Infusions: . methocarbamol (ROBAXIN) IV       LOS: 2 days     Cordelia Poche, MD [facility] 03/04/2021, 10:19 AM  If 7PM-7AM, please contact night-coverage [email or web address]

## 2021-03-04 NOTE — Consult Note (Signed)
Cardiology Consultation:   Patient ID: Jerome Mccormick MRN: [id number]; DOB: 04/02/1991  Admit date: 03/02/2021 Date of Consult: 03/04/2021  PCP:  Patient, No Pcp Per (Inactive)   [facility]  Cardiologist:  [place] [facility] Provider:  No care team member to display Electrophysiologist:  None    Patient Profile:   Jerome Mccormick is a 30 y.o. male with a hx of tobacco use, alcohol abuse, and migraines who is being seen today for the evaluation of abnormal echo at the request of Dr. Caleb Popp.  History of Present Illness:   Jerome Mccormick has no significant past medical history. No cardiac history. No family cardiac history. Father had issues with kidneys and diabetes. Patient is a smoker. He drink at least 35oz alcohol daily. 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 No chest pain or sob with activity. He has palpitations after drinking 5-hour energy drinks.   The patient presented 03/02/21 for migraine with nasuea and vomiting. He was having severe migraines and talking a lot of Tylenol and Advil without relief. Also reported episodes of hematuria and nose bleeds. Also noted generalized body swelling. He reported some congestion for the last week. No recent fever, chills, chest pain, sob, LLe, orthopnea, pnd. In the ED vitals were stable. He was given benadryl, Toradol, Reglan and IVF for headache. CT head was unremarkable. Labs showed creatinine 4.3, BUN 75, sdium 130, albumin 2.7. CBC showed Hgb 8.8 but otherwise unremarkable labs. UA showed hgb, WBCs, protein. UDS shoed cannabinoids and tricyclics. CT renal study showed unremarkable kidneys and small amount of free fluid. Patient was admitted for further work-up. Heart murmur was noted so an echo was ordered. Echo showed normal Lv function with mildly elevated pulmonary artery systolic pressure, moderately dilated LA, abnormal mitral valve, posterior leaflet with prolapse, unable to exclude flail leaflet or  torn chordae, moderate eccentric MR, dilated IVC.    Past Medical History:  Diagnosis Date  . Allergy   . Headache     History reviewed. No pertinent surgical history.   Home Medications:  Prior to Admission medications   Medication Sig Start Date End Date Taking? Authorizing Provider  ibuprofen (ADVIL) 200 MG tablet Take 200 mg by mouth every 6 (six) hours as needed.   Yes [provider]    Inpatient Medications: Scheduled Meds: . fentaNYL      . folic acid  1 mg Oral Daily  . midazolam      . multivitamin with minerals  1 tablet Oral Daily  . nicotine  14 mg Transdermal Daily  . thiamine  100 mg Oral Daily   Or  . thiamine  100 mg Intravenous Daily   Continuous Infusions: . methocarbamol (ROBAXIN) IV     PRN Meds: acetaminophen **OR** acetaminophen, LORazepam **OR** LORazepam, methocarbamol (ROBAXIN) IV, ondansetron **OR** ondansetron (ZOFRAN) IV, polyethylene glycol, traZODone  Allergies:   No Known Allergies  Social History:   Social History   Socioeconomic History  . Marital status: Single    Spouse name: Not on file  . Number of children: 0  . Years of education: 93  . Highest education level: Not on file  Occupational History  . Occupation: Restaurant work  Tobacco Use  . Smoking status: Current Every Day Smoker    Packs/day: 0.75    Types: Cigarettes  . Smokeless tobacco: Never Used  Substance and Sexual Activity  . Alcohol use: Yes    Comment: >100 oz per week - beer  .  Drug use: Yes    Types: Marijuana  . Sexual activity: Not on file  Other Topics Concern  . Not on file  Social History Narrative   Jerome Mccormick grew up in [place], [place]. Currently unemployed. He enjoys fixing things around the house. He also enjoys hanging out with friends.   Social Determinants of Health   Financial Resource Strain: Not on file  Food Insecurity: Not on file  Transportation Needs: Not on file  Physical Activity: Not on file  Stress: Not on file  Social  Connections: Not on file  Intimate Partner Violence: Not on file    Family History:   *History reviewed. No pertinent family history.   ROS:  Please see the history of present illness.  All other ROS reviewed and negative.     Physical Exam/Data:   Vitals:   03/04/21 1107 03/04/21 1111 03/04/21 1115 03/04/21 1130  BP: 130/75 122/68 116/71 113/73  Pulse: 77 78 80 67  Resp: Temp:    98.4 F (36.9 C)  TempSrc:      SpO2: 99% 98% 99% 98%  Weight:      Height:        Intake/Output Summary (Last 24 hours) at 03/04/2021 1320 Last data filed at 03/03/2021 1901 Gross per 24 hour  Intake 360 ml  Output --  Net 360 ml   Last 3 Weights 03/02/2021 12/15/2017 02/23/2014  Weight (lbs) 138 lb 145 lb 164 lb  Weight (kg) 62.596 kg 65.772 kg 74.39 kg     Body mass index is 21.61 kg/m.  General:  Well nourished, well developed, in no acute distress HEENT: normal Lymph: no adenopathy Neck: no JVD Endocrine:  No thryomegaly Vascular: No carotid bruits; FA pulses 2+ bilaterally without bruits  Cardiac:  normal S1, S2; RRR; + murmur  Lungs:  clear to auscultation bilaterally, no wheezing, rhonchi or rales  Abd: soft, nontender, no hepatomegaly  Ext: no edema Musculoskeletal:  No deformities, BUE and BLE strength normal and equal Skin: warm and dry  Neuro:  CNs 2-12 intact, no focal abnormalities noted Psych:  Normal affect   EKG:  The EKG was personally reviewed and demonstrates:  pending Telemetry:  Telemetry was personally reviewed and demonstrates:  N/A  Relevant CV Studies:  Echo 03/04/21 1. Left ventricular ejection fraction, by estimation, is 60 to 65%. The  left ventricle has normal function. The left ventricle has no regional  wall motion abnormalities. Left ventricular diastolic parameters were  normal.  2. Right ventricular systolic function is normal. The right ventricular  size is normal. There is mildly elevated pulmonary artery systolic  pressure. The  estimated right ventricular systolic pressure is 38.3 mmHg.  3. Left atrial size was moderately dilated.  4. The mitral valve is abnormal.Posterior leaflet with prolapse, unable  to exclude partially flail leaflet or torn chordea (images 30-32) Moderate  eccentric mitral valve regurgitation. No evidence of mitral stenosis.  5. The inferior vena cava is dilated in size with >50% respiratory  variability, suggesting right atrial pressure of 8 mmHg.   Laboratory Data:  High Sensitivity Troponin:  No results for input(s): TROPONINIHS in the last 720 hours.   Chemistry Recent Labs  Lab 03/02/21 1631 03/03/21 0552 03/04/21 0633  NA 130* 133* 133*  K 4.3 4.4 4.2  CL 98 103 103  CO2 24 21* 21*  GLUCOSE 97 115* 106*  BUN 75* 75* 73*  CREATININE 4.31* 4.10* 4.35*  CALCIUM 7.2* 7.1* 7.4*  GFRNONAA 18* 19* 18*  ANIONGAP 8 9 9     Recent Labs  Lab 03/02/21 1631 03/04/21 0633  PROT 6.5  --   ALBUMIN 2.7* 2.3*  AST 15  --   ALT 12  --   ALKPHOS 46  --   BILITOT 0.4  --    Hematology Recent Labs  Lab 03/02/21 1631 03/03/21 0552 03/04/21 0633  WBC 7.6 9.5 7.0  RBC 3.12* 3.29* 3.20*  HGB 8.8* 9.2* 9.0*  HCT 27.5* 28.6* 28.0*  MCV 88.1 86.9 87.5  MCH 28.2 28.0 28.1  MCHC 32.0 32.2 32.1  RDW 13.4 13.2 13.2  PLT 309 319 295   BNPNo results for input(s): BNP, PROBNP in the last 168 hours.  DDimer No results for input(s): DDIMER in the last 168 hours.   Radiology/Studies:  CT Head Wo Contrast  Result Date: 03/02/2021 CLINICAL DATA:  Headache. EXAM: CT HEAD WITHOUT CONTRAST TECHNIQUE: Contiguous axial images were obtained from the base of the skull through the vertex without intravenous contrast. COMPARISON:  12/15/2017. FINDINGS: Brain: No evidence of acute infarction, hemorrhage, hydrocephalus, extra-axial collection or mass lesion/mass effect. 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 Vascular: No hyperdense vessel identified. Skull:  No acute fracture. Sinuses/Orbits: Minimal ethmoid air cell mucosal thickening. No air-fluid levels visualized. Unremarkable visualized orbits. Other: No mastoid effusion. IMPRESSION: No evidence of acute intracranial abnormality. Electronically Signed   By: 12/17/2017 MD   On: 03/02/2021 16:30   ECHOCARDIOGRAM COMPLETE  Result Date: 03/03/2021    ECHOCARDIOGRAM REPORT   Patient Name:   Jerome Mccormick Date of Exam: 03/03/2021 Medical Rec #:  05/03/2021      Height:       67.0 in Accession #:    [id number]     Weight:       138.0 lb Date of Birth:  12-26-1990      BSA:          1.727 m Patient Age:    30 years       BP:           114/79 mmHg Patient Gender: M              HR:           88 bpm. Exam Location:  [facility] Procedure: 2D Echo, Cardiac Doppler and Color Doppler Indications:     Murmur R01.1  History:         Patient has no prior history of Echocardiogram examinations.  Sonographer:     12/17/1990 Roar Referring Phys:  Neysa Bonito [id number] Diagnosing Phys: Venora Maples MD IMPRESSIONS  1. Left ventricular ejection fraction, by estimation, is 60 to 65%. The left ventricle has normal function. The left ventricle has no regional wall motion abnormalities. Left ventricular diastolic parameters were normal.  2. Right ventricular systolic function is normal. The right ventricular size is normal. There is mildly elevated pulmonary artery systolic pressure. The estimated right ventricular systolic pressure is 38.3 mmHg.  3. Left atrial size was moderately dilated.  4. The mitral valve is abnormal.Posterior leaflet with prolapse, unable to exclude partially flail leaflet or torn chordea (images 30-32) Moderate eccentric mitral valve regurgitation. No evidence of mitral stenosis.  5. The inferior vena cava is dilated in size with >50% respiratory variability, suggesting right atrial pressure of 8 mmHg. FINDINGS  Left Ventricle: Left ventricular ejection fraction, by estimation, is 60 to 65%. The left ventricle  has normal function.  The left ventricle has no regional wall motion abnormalities. The left ventricular internal cavity size was normal in size. There is  no left ventricular hypertrophy. Left ventricular diastolic parameters were normal. Right Ventricle: The right ventricular size is normal. No increase in right ventricular wall thickness. Right ventricular systolic function is normal. There is mildly elevated pulmonary artery systolic pressure. The tricuspid regurgitant velocity is 2.66  m/s, and with an assumed right atrial pressure of 10 mmHg, the estimated right ventricular systolic pressure is 38.3 mmHg. Left Atrium: Left atrial size was moderately dilated. Right Atrium: Right atrial size was normal in size. Pericardium: There is no evidence of pericardial effusion. Mitral Valve: The mitral valve is abnormal.Posterior leaflet with prolapse, unable to exclude partially flail leaflet or torn chordea (images 30-32)Moderate mitral valve regurgitation. No evidence of mitral valve stenosis. Tricuspid Valve: The tricuspid valve is normal in structure. Tricuspid valve regurgitation is mild . No evidence of tricuspid stenosis. Aortic Valve: The aortic valve is normal in structure. Aortic valve regurgitation is not visualized. No aortic stenosis is present. Aortic valve peak gradient measures 7.2 mmHg. Pulmonic Valve: The pulmonic valve was normal in structure. Pulmonic valve regurgitation is not visualized. No evidence of pulmonic stenosis. Aorta: The aortic root is normal in size and structure. Venous: The inferior vena cava is dilated in size with greater than 50% respiratory variability, suggesting right atrial pressure of 8 mmHg. IAS/Shunts: No atrial level shunt detected by color flow Doppler.  LEFT VENTRICLE PLAX 2D LVIDd:         4.86 cm  Diastology LVIDs:         3.55 cm  LV e' medial:    12.40 cm/s LV PW:         1.10 cm  LV E/e' medial:  11.5 LV IVS:        0.95 cm  LV e' lateral:   17.30 cm/s LVOT diam:      2.00 cm  LV E/e' lateral: 8.2 LVOT Area:     3.14 cm  RIGHT VENTRICLE RV Mid diam:    3.43 cm RV S prime:     15.60 cm/s TAPSE (M-mode): 3.4 cm LEFT ATRIUM           Index       RIGHT ATRIUM           Index LA diam:      4.30 cm 2.49 cm/m  RA Area:     17.80 cm LA Vol (A2C): 97.9 ml 56.68 ml/m RA Volume:   50.50 ml  29.24 ml/m LA Vol (A4C): 52.4 ml 30.34 ml/m  AORTIC VALVE                PULMONIC VALVE AV Area (Vmax): 2.95 cm    PV Vmax:        1.18 m/s AV Vmax:        134.00 cm/s PV Peak grad:   5.6 mmHg AV Peak Grad:   7.2 mmHg    RVOT Peak grad: 1 mmHg LVOT Vmax:      126.00 cm/s  AORTA Ao Root diam: 2.70 cm MITRAL VALVE                TRICUSPID VALVE MV Area (PHT): 3.02 cm     TR Peak grad:   28.3 mmHg MV Decel Time: 251 msec     TR Vmax:        266.00 cm/s MV E velocity: 142.00 cm/s MV A velocity: 103.00  cm/s  SHUNTS MV E/A ratio:  1.38         Systemic Diam: 2.00 cm MV A Prime:    10.4 cm/s Julien Nordmannimothy Gollan MD Electronically signed by Julien Nordmannimothy Gollan MD Signature Date/Time: 03/03/2021/6:08:05 PM    Final    CT Renal Stone Study  Result Date: 03/02/2021 CLINICAL DATA:  Renal parenchymal disease with hematuria EXAM: CT ABDOMEN AND PELVIS WITHOUT CONTRAST TECHNIQUE: Multidetector CT imaging of the abdomen and pelvis was performed following the standard protocol without IV contrast. COMPARISON:  07/02/2012 FINDINGS: Lower chest: No acute abnormality. Hepatobiliary: No focal hepatic abnormality. Gallbladder unremarkable. Pancreas: No focal abnormality or ductal dilatation. Spleen: No focal abnormality.  Normal size. Adrenals/Urinary Tract: No adrenal abnormality. No focal renal abnormality. No stones or hydronephrosis. Urinary bladder is unremarkable. Stomach/Bowel: Normal appendix. Stomach, large and small bowel grossly unremarkable. Vascular/Lymphatic: No evidence of aneurysm or adenopathy. Reproductive: No visible focal abnormality. Other: Small amount of free fluid noted adjacent to the liver and in the  cul-de-sac. Musculoskeletal: No acute bony abnormality. IMPRESSION: No renal or ureteral stones.  No hydronephrosis. Small amount of free fluid in the abdomen and pelvis of unknown etiology. Electronically Signed   By: Charlett NoseKevin  Dover M.D.   On: 03/02/2021 20:38   US BIOPSY (KIDNEY)  Result Date: 03/04/2021 INDICATION: 30 year old male with a history of acute renal failure and 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 ANESTHESIA/SEDATION: Moderate (conscious) sedation was employed during this procedure. A total of Versed 2.0 mg and Fentanyl 100 mcg was administered intravenously. Moderate Sedation Time: 10 minutes. The patient's level of consciousness and vital signs were monitored continuously by radiology nursing throughout the procedure under my direct supervision. FLUOROSCOPY TIME:  Ultrasound COMPLICATIONS: None PROCEDURE: Informed written consent was obtained from the patient after a thorough discussion of the procedural risks, benefits and alternatives. All questions were addressed. Maximal Sterile Barrier Technique was utilized including caps, mask, sterile gowns, sterile gloves, sterile drape, hand hygiene and skin antiseptic. A timeout was performed prior to the initiation of the procedure Patient was positioned prone position on the gantry table. Images were stored sent to PACs. Once the patient is prepped and draped in the usual sterile fashion, the skin and subcutaneous tissues overlying the left kidney were generously infiltrated 1% lidocaine for local anesthesia. Using ultrasound guidance, a 15 gauge guide needle was advanced into the lower cortex of the left kidney. Once we confirmed location of the needle tip, 2 separate 16 gauge core biopsy were achieved. Two Gel-Foam pledgets were infused with a small amount of saline. The needle was removed. Final images were stored. The patient tolerated the procedure well and remained hemodynamically stable throughout. No  complications were encountered and no significant blood loss encountered. IMPRESSION: Status post ultrasound-guided biopsy of left kidney for medical renal purpose. Signed, Yvone NeuJaime S. Reyne DumasWagner, DO, RPVI [facility] Electronically Signed   By: Gilmer MorJaime  Wagner D.O.   On: 03/04/2021 12:11     Assessment and Plan:   AKI Hematuria Proteinuria - Found to have severe AKI with creatinine of 4.31. etiology unclear>> nephrotic/niphritic features - Nephrology following - kidney biopsy today  Heart murmur Abnormal echo finding - Echo showed normal LVEF, posterior leaflet with prolapse, unable to exclude flail leaflet or torn chordae, moderate eccentric MR. Md to review - He has not signs of volume overload. Murmur on exam - Might need TEE vs cath. Given kidney function not a good candidate for cath. Will  discuss with MD  Anemia - suspected related to kdiney injury  History of alcohol use - CIWA  Tobacco use -cessation discussed  For questions or updates, please contact [facility] Please consult [email or web address] for contact info under    Signed, Gabby Rackers David Stall, PA-C  03/04/2021 1:20 PM

## 2021-03-04 NOTE — Procedures (Signed)
Interventional Radiology Procedure Note  Procedure: US guided biopsy of left kidney, medical liver Complications: None EBL: None Recommendations: - Bedrest 3 hours. Ambulate per primary order  - Routine wound care - Follow up pathology - Advance diet per primary order   Signed,  Gilmer Mor, DO

## 2021-03-04 NOTE — Progress Notes (Signed)
[place], [place] 03/04/21  Subjective:   LOS: 2  Jerome Mccormick is a 30 y.o. male with an extensive history of NSAID use and alcohol use. He presents to ED with 7 days of headaches and congestion. He is found to increased Creatinine 4.31.  Patient seen this morning resting in bed Currently NPO for procedure Denies shortness of breath and nausea     Objective:  Vital signs in last 24 hours:  Temp:  [97.6 F (36.4 C)-98.7 F (37.1 C)] 98.3 F (36.8 C) (04/04 0744) Pulse Rate:  [70-85] 73 (04/04 0744) Resp:  [16-18] 16 (04/04 0744) BP: (118-142)/(69-80) 131/78 (04/04 0744) SpO2:  [98 %-100 %] 100 % (04/04 0744)  Weight change:  Filed Weights   03/02/21 1533  Weight: 62.6 kg    Intake/Output:    Intake/Output Summary (Last 24 hours) at 03/04/2021 1006 Last data filed at 03/03/2021 1901 Gross per 24 hour  Intake 360 ml  Output --  Net 360 ml     Physical Exam: General: NAD, resting in bed  HEENT Anicteric, moist oral mucosa  Pulm/lungs Clear bilaterally, normal breathing pattern  CVS/Heart S1S2 present, regular rate  Abdomen:  Soft, non tender  Extremities: No peripheral edema  Neurologic: Alert, oriented, able to answer questions  Skin: No rashes or masses          Basic Metabolic Panel:  Recent Labs  Lab 03/02/21 1631 03/03/21 0552 03/04/21 0633  NA 130* 133* 133*  K 4.3 4.4 4.2  CL 98 103 103  CO2 24 21* 21*  GLUCOSE 97 115* 106*  BUN 75* 75* 73*  CREATININE 4.31* 4.10* 4.35*  CALCIUM 7.2* 7.1* 7.4*  PHOS  --   --  5.6*     CBC: Recent Labs  Lab 03/02/21 1631 03/03/21 0552 03/04/21 0633  WBC 7.6 9.5 7.0  NEUTROABS 5.7  --   --   HGB 8.8* 9.2* 9.0*  HCT 27.5* 28.6* 28.0*  MCV 88.1 86.9 87.5  PLT 309 319 295      Lab Results  Component Value Date   HEPBSAG NON REACTIVE 03/02/2021   HEPBSAB NON REACTIVE 03/02/2021      Microbiology:  Recent Results (from the past 240 hour(s))  Resp Panel by  RT-PCR (Flu A&B, Covid) Nasopharyngeal Swab     Status: None   Collection Time: 03/02/21  4:31 PM   Specimen: Nasopharyngeal Swab; Nasopharyngeal(NP) swabs in vial transport medium  Result Value Ref Range Status   SARS Coronavirus 2 by RT PCR NEGATIVE NEGATIVE Final    Comment: (NOTE) SARS-CoV-2 target nucleic acids are NOT DETECTED.  The SARS-CoV-2 RNA is generally detectable in upper respiratory specimens during the acute phase of infection. The lowest concentration of SARS-CoV-2 viral copies this assay can detect is 138 copies/mL. A negative result does not preclude SARS-Cov-2 infection and should not be used as the sole basis for treatment or other patient management decisions. A negative result may occur with  improper specimen collection/handling, submission of specimen other than nasopharyngeal swab, presence of viral mutation(s) within the areas targeted by this assay, and inadequate number of viral copies(<138 copies/mL). A negative result must be combined with clinical observations, patient history, and epidemiological information. The expected result is Negative.  Fact Sheet for Patients:  EntrepreneurPulse.com.au  Fact Sheet for Healthcare Providers:  IncredibleEmployment.be  This test is no t yet approved or cleared by the [place] FDA and  has been authorized for detection and/or diagnosis of SARS-CoV-2  by FDA under an Emergency Use Authorization (EUA). This EUA will remain  in effect (meaning this test can be used) for the duration of the COVID-19 declaration under Section 564(b)(1) of the Act, 21 U.S.C.section 360bbb-3(b)(1), unless the authorization is terminated  or revoked sooner.       Influenza A by PCR NEGATIVE NEGATIVE Final   Influenza B by PCR NEGATIVE NEGATIVE Final    Comment: (NOTE) The Xpert Xpress SARS-CoV-2/FLU/RSV plus assay is intended as an aid in the diagnosis of influenza from Nasopharyngeal swab  specimens and should not be used as a sole basis for treatment. Nasal washings and aspirates are unacceptable for Xpert Xpress SARS-CoV-2/FLU/RSV testing.  Fact Sheet for Patients: EntrepreneurPulse.com.au  Fact Sheet for Healthcare Providers: IncredibleEmployment.be  This test is not yet approved or cleared by the [place] FDA and has been authorized for detection and/or diagnosis of SARS-CoV-2 by FDA under an Emergency Use Authorization (EUA). This EUA will remain in effect (meaning this test can be used) for the duration of the COVID-19 declaration under Section 564(b)(1) of the Act, 21 U.S.C. section 360bbb-3(b)(1), unless the authorization is terminated or revoked.  Performed at [facility], [address] [place], [place] [address]     Coagulation Studies: Recent Labs    03/04/21 2633  LABPROT 14.8  INR 1.2    Urinalysis: Recent Labs    03/02/21 1905  COLORURINE AMBER*  LABSPEC 1.012  PHURINE 5.0  GLUCOSEU NEGATIVE  HGBUR LARGE*  BILIRUBINUR NEGATIVE  KETONESUR NEGATIVE  PROTEINUR >=300*  NITRITE NEGATIVE  LEUKOCYTESUR MODERATE*      Imaging: CT Head Wo Contrast  Result Date: 03/02/2021 CLINICAL DATA:  Headache. EXAM: CT HEAD WITHOUT CONTRAST TECHNIQUE: Contiguous axial images were obtained from the base of the skull through the vertex without intravenous contrast. COMPARISON:  12/15/2017. FINDINGS: Brain: No evidence of acute infarction, hemorrhage, hydrocephalus, extra-axial collection or mass lesion/mass effect. 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 Vascular: No hyperdense vessel identified. Skull: No acute fracture. Sinuses/Orbits: Minimal ethmoid air cell mucosal thickening. No air-fluid levels visualized. Unremarkable visualized orbits. Other: No mastoid effusion. IMPRESSION: No evidence of acute intracranial abnormality. Electronically Signed   By: Jerome Sheffield MD   On: 03/02/2021 16:30   ECHOCARDIOGRAM COMPLETE  Result Date: 03/03/2021    ECHOCARDIOGRAM REPORT   Patient Name:   Jerome Mccormick Date of Exam: 03/03/2021 Medical Rec #:  [id number]      Height:       67.0 in Accession #:    [id number]     Weight:       138.0 lb Date of Birth:  Aug 08, 1991      BSA:          1.727 m Patient Age:    30 years       BP:           114/79 mmHg Patient Gender: M              HR:           88 bpm. Exam Location:  [facility] Procedure: 2D Echo, Cardiac Doppler and Color Doppler Indications:     Murmur R01.1  History:         Patient has no prior history of Echocardiogram examinations.  Sonographer:     Alyse Low Roar Referring Phys:  [id number] Clarnce Flock Diagnosing Phys: Ida Rogue MD IMPRESSIONS  1. Left ventricular ejection fraction, by estimation, is 60  to 65%. The left ventricle has normal function. The left ventricle has no regional wall motion abnormalities. Left ventricular diastolic parameters were normal.  2. Right ventricular systolic function is normal. The right ventricular size is normal. There is mildly elevated pulmonary artery systolic pressure. The estimated right ventricular systolic pressure is 81.4 mmHg.  3. Left atrial size was moderately dilated.  4. The mitral valve is abnormal.Posterior leaflet with prolapse, unable to exclude partially flail leaflet or torn chordea (images 30-32) Moderate eccentric mitral valve regurgitation. No evidence of mitral stenosis.  5. The inferior vena cava is dilated in size with >50% respiratory variability, suggesting right atrial pressure of 8 mmHg. FINDINGS  Left Ventricle: Left ventricular ejection fraction, by estimation, is 60 to 65%. The left ventricle has normal function. The left ventricle has no regional wall motion abnormalities. The left ventricular internal cavity size was normal in size. There is  no left ventricular hypertrophy. Left ventricular diastolic parameters were normal. Right Ventricle: The right  ventricular size is normal. No increase in right ventricular wall thickness. Right ventricular systolic function is normal. There is mildly elevated pulmonary artery systolic pressure. The tricuspid regurgitant velocity is 2.66  m/s, and with an assumed right atrial pressure of 10 mmHg, the estimated right ventricular systolic pressure is 48.1 mmHg. Left Atrium: Left atrial size was moderately dilated. Right Atrium: Right atrial size was normal in size. Pericardium: There is no evidence of pericardial effusion. Mitral Valve: The mitral valve is abnormal.Posterior leaflet with prolapse, unable to exclude partially flail leaflet or torn chordea (images 30-32)Moderate mitral valve regurgitation. No evidence of mitral valve stenosis. Tricuspid Valve: The tricuspid valve is normal in structure. Tricuspid valve regurgitation is mild . No evidence of tricuspid stenosis. Aortic Valve: The aortic valve is normal in structure. Aortic valve regurgitation is not visualized. No aortic stenosis is present. Aortic valve peak gradient measures 7.2 mmHg. Pulmonic Valve: The pulmonic valve was normal in structure. Pulmonic valve regurgitation is not visualized. No evidence of pulmonic stenosis. Aorta: The aortic root is normal in size and structure. Venous: The inferior vena cava is dilated in size with greater than 50% respiratory variability, suggesting right atrial pressure of 8 mmHg. IAS/Shunts: No atrial level shunt detected by color flow Doppler.  LEFT VENTRICLE PLAX 2D LVIDd:         4.86 cm  Diastology LVIDs:         3.55 cm  LV e' medial:    12.40 cm/s LV PW:         1.10 cm  LV E/e' medial:  11.5 LV IVS:        0.95 cm  LV e' lateral:   17.30 cm/s LVOT diam:     2.00 cm  LV E/e' lateral: 8.2 LVOT Area:     3.14 cm  RIGHT VENTRICLE RV Mid diam:    3.43 cm RV S prime:     15.60 cm/s TAPSE (M-mode): 3.4 cm LEFT ATRIUM           Index       RIGHT ATRIUM           Index LA diam:      4.30 cm 2.49 cm/m  RA Area:     17.80 cm  LA Vol (A2C): 97.9 ml 56.68 ml/m RA Volume:   50.50 ml  29.24 ml/m LA Vol (A4C): 52.4 ml 30.34 ml/m  AORTIC VALVE  PULMONIC VALVE AV Area (Vmax): 2.95 cm    PV Vmax:        1.18 m/s AV Vmax:        134.00 cm/s PV Peak grad:   5.6 mmHg AV Peak Grad:   7.2 mmHg    RVOT Peak grad: 1 mmHg LVOT Vmax:      126.00 cm/s  AORTA Ao Root diam: 2.70 cm MITRAL VALVE                TRICUSPID VALVE MV Area (PHT): 3.02 cm     TR Peak grad:   28.3 mmHg MV Decel Time: 251 msec     TR Vmax:        266.00 cm/s MV E velocity: 142.00 cm/s MV A velocity: 103.00 cm/s  SHUNTS MV E/A ratio:  1.38         Systemic Diam: 2.00 cm MV A Prime:    10.4 cm/s Ida Rogue MD Electronically signed by Ida Rogue MD Signature Date/Time: 03/03/2021/6:08:05 PM    Final    CT Renal Stone Study  Result Date: 03/02/2021 CLINICAL DATA:  Renal parenchymal disease with hematuria EXAM: CT ABDOMEN AND PELVIS WITHOUT CONTRAST TECHNIQUE: Multidetector CT imaging of the abdomen and pelvis was performed following the standard protocol without IV contrast. COMPARISON:  07/02/2012 FINDINGS: Lower chest: No acute abnormality. Hepatobiliary: No focal hepatic abnormality. Gallbladder unremarkable. Pancreas: No focal abnormality or ductal dilatation. Spleen: No focal abnormality.  Normal size. Adrenals/Urinary Tract: No adrenal abnormality. No focal renal abnormality. No stones or hydronephrosis. Urinary bladder is unremarkable. Stomach/Bowel: Normal appendix. Stomach, large and small bowel grossly unremarkable. Vascular/Lymphatic: No evidence of aneurysm or adenopathy. Reproductive: No visible focal abnormality. Other: Small amount of free fluid noted adjacent to the liver and in the cul-de-sac. Musculoskeletal: No acute bony abnormality. IMPRESSION: No renal or ureteral stones.  No hydronephrosis. Small amount of free fluid in the abdomen and pelvis of unknown etiology. Electronically Signed   By: Rolm Baptise M.D.   On: 03/02/2021 20:38      Medications:   . methocarbamol (ROBAXIN) IV     . folic acid  1 mg Oral Daily  . multivitamin with minerals  1 tablet Oral Daily  . nicotine  14 mg Transdermal Daily  . thiamine  100 mg Oral Daily   Or  . thiamine  100 mg Intravenous Daily   acetaminophen **OR** acetaminophen, LORazepam **OR** LORazepam, methocarbamol (ROBAXIN) IV, ondansetron **OR** ondansetron (ZOFRAN) IV, polyethylene glycol, traZODone  Assessment/ Plan:  30 y.o. male with  was admitted on 03/02/2021 for  Active Problems:   AKI (acute kidney injury) ([place])   Anemia  AKI (acute kidney injury) ([place]) [N17.9] Acute kidney injury ([place]) [N17.9]  #. Acute kidney injury. Baseline creatinine 0.96/EGFR >60 on April 2015 - Creatinine 4.35/EGFR 18 - Continues void regularly, instructed to use urinal - NPO for Renal biopsy today  #. Anemia, iron defiency   Lab Results  Component Value Date   HGB 9.0 (L) 03/04/2021   Will continue to monitor  #. Hematuria - requested patient use urinal to monitor hematuria - avoid heparin and aspirin - Renal biopsy completed 03/04/21  # Headaches -CT head negative - Tylenol for mild pain   LOS: 2 [employer] 4/4/202210:06 [place], [place]

## 2021-03-04 NOTE — Progress Notes (Signed)
Patient returned from Kidney biopsy

## 2021-03-04 NOTE — Progress Notes (Signed)
Patient clinically stable post Renal biopsy per DR Loreta Ave, tolerated well, denies complaints at this time. Vitals remained stable throughout and after procedure. Received Versed 2 mg along with Fentanyl 100 mcg IV for procedure. Report given to [place] post procedure/recovery at bedside with questions answered.

## 2021-03-04 NOTE — Consult Note (Signed)
Chief Complaint: AKI. Request is for random kidney biopsy   Referring Physician(s): Dr. Earleen Reaper  Supervising Physician: Gilmer Mor  Patient Status: [facility] - In-pt  History of Present Illness: Jerome Mccormick is a 30 y.o. male No pertinent medical history. Presented to the ED at [facility] with persistent and worsening migraines with associated nausea, vomiting, nasal congestion, nosebleeds and intermitted hematuria X 2 to 3 weeks. Found to be in AKI. CT renal from 4.2.22 reads No renal or ureteral stones.  No hydronephrosis. Team is requesting a renal biopsy for further evaluation of possible vasculitis.   Currently without any significant complaints. Patient alert and laying in bed, calm and comfortable. Denies any fevers, chest pain, SOB, cough, abdominal pain, vomiting or bleeding. Endorses nausea that resolved with antiemetic. Currently reporting frontal headache.Return precautions and treatment recommendations and follow-up discussed with the patient who is agreeable with the plan.   Past Medical History:  Diagnosis Date  . Allergy   . Headache     History reviewed. No pertinent surgical history.  Allergies: Patient has no known allergies.  Medications: Prior to Admission medications   Medication Sig Start Date End Date Taking? Authorizing Provider  HYDROcodone-acetaminophen (NORCO) 5-325 MG tablet Take 1 tablet by mouth 3 (three) times daily as needed. 01/10/21   Menshew, Charlesetta Ivory, PA-C     History reviewed. No pertinent family history.  Social History   Socioeconomic History  . Marital status: Single    Spouse name: Not on file  . Number of children: 0  . Years of education: 16  . Highest education level: Not on file  Occupational History  . Occupation: Restaurant work  Tobacco Use  . Smoking status: Current Every Day Smoker    Packs/day: 0.75    Types: Cigarettes  . Smokeless tobacco: Never Used  Substance and Sexual Activity  . Alcohol use: Yes     Comment: >100 oz per week - beer  . Drug use: Yes    Types: Marijuana  . Sexual activity: Not on file  Other Topics Concern  . Not on file  Social History Narrative   Octavius grew up in [place], [place]. Currently unemployed. He enjoys fixing things around the house. He also enjoys hanging out with friends.   Social Determinants of Health   Financial Resource Strain: Not on file  Food Insecurity: Not on file  Transportation Needs: Not on file  Physical Activity: Not on file  Stress: Not on file  Social Connections: Not on file      Review of Systems: A 12 point ROS discussed and pertinent positives are indicated in the HPI above.  All other systems are negative.  Review of Systems  Constitutional: Negative for fever.  HENT: Negative for congestion.   Respiratory: Negative for cough and shortness of breath.   Cardiovascular: Negative for chest pain.  Gastrointestinal: Positive for nausea ( improved with antiemetic taken earlier today.). Negative for abdominal pain.  Neurological: Positive for headaches.  Psychiatric/Behavioral: Negative for behavioral problems and confusion.    Vital Signs: BP 131/78 (BP Location: Left Arm)   Pulse 73   Temp 98.3 F (36.8 C) (Oral)   Resp 16   Ht 5\' 7"  (1.702 m)   Wt 138 lb (62.6 kg)   SpO2 100%   BMI 21.61 kg/m   Physical Exam Vitals and nursing note reviewed.  Constitutional:      Appearance: He is well-developed.  HENT:     Head: Normocephalic.  Cardiovascular:  Rate and Rhythm: Normal rate and regular rhythm.     Heart sounds: Normal heart sounds.  Pulmonary:     Effort: Pulmonary effort is normal.     Breath sounds: Normal breath sounds.  Musculoskeletal:        General: Normal range of motion.     Cervical back: Normal range of motion.  Skin:    General: Skin is dry.  Neurological:     Mental Status: He is alert and oriented to person, place, and time.     Imaging: CT Head Wo Contrast  Result Date:  03/02/2021 CLINICAL DATA:  Headache. EXAM: CT HEAD WITHOUT CONTRAST TECHNIQUE: Contiguous axial images were obtained from the base of the skull through the vertex without intravenous contrast. COMPARISON:  12/15/2017. FINDINGS: Brain: No evidence of acute infarction, hemorrhage, hydrocephalus, extra-axial collection or mass lesion/mass effect. 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 Vascular: No hyperdense vessel identified. Skull: No acute fracture. Sinuses/Orbits: Minimal ethmoid air cell mucosal thickening. No air-fluid levels visualized. Unremarkable visualized orbits. Other: No mastoid effusion. IMPRESSION: No evidence of acute intracranial abnormality. Electronically Signed   By: Feliberto Harts MD   On: 03/02/2021 16:30   ECHOCARDIOGRAM COMPLETE  Result Date: 03/03/2021    ECHOCARDIOGRAM REPORT   Patient Name:   Jerome Mccormick Date of Exam: 03/03/2021 Medical Rec #:  [id number]      Height:       67.0 in Accession #:    [id number]     Weight:       138.0 lb Date of Birth:  1991/09/23      BSA:          1.727 m Patient Age:    30 years       BP:           114/79 mmHg Patient Gender: M              HR:           88 bpm. Exam Location:  [facility] Procedure: 2D Echo, Cardiac Doppler and Color Doppler Indications:     Murmur R01.1  History:         Patient has no prior history of Echocardiogram examinations.  Sonographer:     Neysa Bonito Roar Referring Phys:  [id number] Venora Maples Diagnosing Phys: Julien Nordmann MD IMPRESSIONS  1. Left ventricular ejection fraction, by estimation, is 60 to 65%. The left ventricle has normal function. The left ventricle has no regional wall motion abnormalities. Left ventricular diastolic parameters were normal.  2. Right ventricular systolic function is normal. The right ventricular size is normal. There is mildly elevated pulmonary artery systolic pressure. The estimated right ventricular systolic pressure is 38.3 mmHg.  3. Left atrial size  was moderately dilated.  4. The mitral valve is abnormal.Posterior leaflet with prolapse, unable to exclude partially flail leaflet or torn chordea (images 30-32) Moderate eccentric mitral valve regurgitation. No evidence of mitral stenosis.  5. The inferior vena cava is dilated in size with >50% respiratory variability, suggesting right atrial pressure of 8 mmHg. FINDINGS  Left Ventricle: Left ventricular ejection fraction, by estimation, is 60 to 65%. The left ventricle has normal function. The left ventricle has no regional wall motion abnormalities. The left ventricular internal cavity size was normal in size. There is  no left ventricular hypertrophy. Left ventricular diastolic parameters were normal. Right Ventricle: The right ventricular size is normal. No increase in right ventricular wall thickness. Right  ventricular systolic function is normal. There is mildly elevated pulmonary artery systolic pressure. The tricuspid regurgitant velocity is 2.66  m/s, and with an assumed right atrial pressure of 10 mmHg, the estimated right ventricular systolic pressure is 38.3 mmHg. Left Atrium: Left atrial size was moderately dilated. Right Atrium: Right atrial size was normal in size. Pericardium: There is no evidence of pericardial effusion. Mitral Valve: The mitral valve is abnormal.Posterior leaflet with prolapse, unable to exclude partially flail leaflet or torn chordea (images 30-32)Moderate mitral valve regurgitation. No evidence of mitral valve stenosis. Tricuspid Valve: The tricuspid valve is normal in structure. Tricuspid valve regurgitation is mild . No evidence of tricuspid stenosis. Aortic Valve: The aortic valve is normal in structure. Aortic valve regurgitation is not visualized. No aortic stenosis is present. Aortic valve peak gradient measures 7.2 mmHg. Pulmonic Valve: The pulmonic valve was normal in structure. Pulmonic valve regurgitation is not visualized. No evidence of pulmonic stenosis. Aorta: The  aortic root is normal in size and structure. Venous: The inferior vena cava is dilated in size with greater than 50% respiratory variability, suggesting right atrial pressure of 8 mmHg. IAS/Shunts: No atrial level shunt detected by color flow Doppler.  LEFT VENTRICLE PLAX 2D LVIDd:         4.86 cm  Diastology LVIDs:         3.55 cm  LV e' medial:    12.40 cm/s LV PW:         1.10 cm  LV E/e' medial:  11.5 LV IVS:        0.95 cm  LV e' lateral:   17.30 cm/s LVOT diam:     2.00 cm  LV E/e' lateral: 8.2 LVOT Area:     3.14 cm  RIGHT VENTRICLE RV Mid diam:    3.43 cm RV S prime:     15.60 cm/s TAPSE (M-mode): 3.4 cm LEFT ATRIUM           Index       RIGHT ATRIUM           Index LA diam:      4.30 cm 2.49 cm/m  RA Area:     17.80 cm LA Vol (A2C): 97.9 ml 56.68 ml/m RA Volume:   50.50 ml  29.24 ml/m LA Vol (A4C): 52.4 ml 30.34 ml/m  AORTIC VALVE                PULMONIC VALVE AV Area (Vmax): 2.95 cm    PV Vmax:        1.18 m/s AV Vmax:        134.00 cm/s PV Peak grad:   5.6 mmHg AV Peak Grad:   7.2 mmHg    RVOT Peak grad: 1 mmHg LVOT Vmax:      126.00 cm/s  AORTA Ao Root diam: 2.70 cm MITRAL VALVE                TRICUSPID VALVE MV Area (PHT): 3.02 cm     TR Peak grad:   28.3 mmHg MV Decel Time: 251 msec     TR Vmax:        266.00 cm/s MV E velocity: 142.00 cm/s MV A velocity: 103.00 cm/s  SHUNTS MV E/A ratio:  1.38         Systemic Diam: 2.00 cm MV A Prime:    10.4 cm/s Julien Nordmannimothy Gollan MD Electronically signed by Julien Nordmannimothy Gollan MD Signature Date/Time: 03/03/2021/6:08:05 PM    Final  CT Renal Stone Study  Result Date: 03/02/2021 CLINICAL DATA:  Renal parenchymal disease with hematuria EXAM: CT ABDOMEN AND PELVIS WITHOUT CONTRAST TECHNIQUE: Multidetector CT imaging of the abdomen and pelvis was performed following the standard protocol without IV contrast. COMPARISON:  07/02/2012 FINDINGS: Lower chest: No acute abnormality. Hepatobiliary: No focal hepatic abnormality. Gallbladder unremarkable. Pancreas: No focal  abnormality or ductal dilatation. Spleen: No focal abnormality.  Normal size. Adrenals/Urinary Tract: No adrenal abnormality. No focal renal abnormality. No stones or hydronephrosis. Urinary bladder is unremarkable. Stomach/Bowel: Normal appendix. Stomach, large and small bowel grossly unremarkable. Vascular/Lymphatic: No evidence of aneurysm or adenopathy. Reproductive: No visible focal abnormality. Other: Small amount of free fluid noted adjacent to the liver and in the cul-de-sac. Musculoskeletal: No acute bony abnormality. IMPRESSION: No renal or ureteral stones.  No hydronephrosis. Small amount of free fluid in the abdomen and pelvis of unknown etiology. Electronically Signed   By: Charlett Nose M.D.   On: 03/02/2021 20:38    Labs:  CBC: Recent Labs    03/02/21 1631 03/03/21 0552 03/04/21 0633  WBC 7.6 9.5 7.0  HGB 8.8* 9.2* 9.0*  HCT 27.5* 28.6* 28.0*  PLT 309 319 295    COAGS: Recent Labs    03/04/21 0633  INR 1.2    BMP: Recent Labs    03/02/21 1631 03/03/21 0552 03/04/21 0633  NA 130* 133* 133*  K 4.3 4.4 4.2  CL 98 103 103  CO2 24 21* 21*  GLUCOSE 97 115* 106*  BUN 75* 75* 73*  CALCIUM 7.2* 7.1* 7.4*  CREATININE 4.31* 4.10* 4.35*  GFRNONAA 18* 19* 18*    LIVER FUNCTION TESTS: Recent Labs    03/02/21 1631 03/04/21 0633  BILITOT 0.4  --   AST 15  --   ALT 12  --   ALKPHOS 46  --   PROT 6.5  --   ALBUMIN 2.7* 2.3*      Assessment and Plan:  30 y.o. male inpatient. No pertinent medical history. Presented to the ED at [facility] with persistent and worsening migraines with associated nausea, vomiting, nasal congestion, nosebleeds and intermitted hematuria X 2 to 3 weeks. Found to be in AKI. CT renal from 4.2.22 reads No renal or ureteral stones.  No hydronephrosis. Team is requesting a renal biopsy for further evaluation of possible vasculitis.   BUN 73, Cr 4.35, Calcium 7.4. Sodium 133. Phosphorus 5.6. All other medications are within acceptable parameters.  Lovenox prophylactic dosage is currently listed as discontinued. NKDA. Patient has been NPO since midnight.  Risks and benefits of kidney biopsy was discussed with the patient and/or patient's family including, but not limited to bleeding, infection, damage to adjacent structures or low yield requiring additional tests.  All of the questions were answered and there is agreement to proceed.  Consent signed and in chart.  Thank you for this interesting consult.  I greatly enjoyed meeting RAUL TORRANCE and look forward to participating in their care.  A copy of this report was sent to the requesting provider on this date.  Electronically Signed: Alene Mires, NP 03/04/2021, 8:08 AM   I spent a total of 40 Minutes    in face to face in clinical consultation, greater than 50% of which was counseling/coordinating care for random kidney biopsy

## 2021-03-05 DIAGNOSIS — R011 Cardiac murmur, unspecified: Secondary | ICD-10-CM

## 2021-03-05 DIAGNOSIS — I330 Acute and subacute infective endocarditis: Secondary | ICD-10-CM | POA: Diagnosis present

## 2021-03-05 DIAGNOSIS — I340 Nonrheumatic mitral (valve) insufficiency: Secondary | ICD-10-CM

## 2021-03-05 HISTORY — DX: Acute and subacute infective endocarditis: I33.0

## 2021-03-05 LAB — MPO/PR-3 (ANCA) ANTIBODIES
ANCA Proteinase 3: <3.5 U/mL (ref 0.0–3.5)
Myeloperoxidase Abs: <9 U/mL (ref 0.0–9.0)

## 2021-03-05 LAB — RENAL FUNCTION PANEL
Albumin: 2.3 g/dL — ABNORMAL LOW (ref 3.5–5.0)
Anion gap: 6 (ref 5–15)
BUN: 72 mg/dL — ABNORMAL HIGH (ref 6–20)
CO2: 23 mmol/L (ref 22–32)
Calcium: 7.7 mg/dL — ABNORMAL LOW (ref 8.9–10.3)
Chloride: 103 mmol/L (ref 98–111)
Creatinine, Ser: 4.22 mg/dL — ABNORMAL HIGH (ref 0.61–1.24)
GFR, Estimated: 18 mL/min — ABNORMAL LOW (ref >60)
Glucose, Bld: 108 mg/dL — ABNORMAL HIGH (ref 70–99)
Phosphorus: 5.7 mg/dL — ABNORMAL HIGH (ref 2.5–4.6)
Potassium: 4.8 mmol/L (ref 3.5–5.1)
Sodium: 132 mmol/L — ABNORMAL LOW (ref 135–145)

## 2021-03-05 LAB — PROTEIN ELECTROPHORESIS, SERUM
A/G Ratio: 0.8 (ref 0.7–1.7)
Albumin ELP: 2.7 g/dL — ABNORMAL LOW (ref 2.9–4.4)
Alpha-1-Globulin: 0.3 g/dL (ref 0.0–0.4)
Alpha-2-Globulin: 0.7 g/dL (ref 0.4–1.0)
Beta Globulin: 0.8 g/dL (ref 0.7–1.3)
Gamma Globulin: 1.4 g/dL (ref 0.4–1.8)
Globulin, Total: 3.2 g/dL (ref 2.2–3.9)
Total Protein ELP: 5.9 g/dL — ABNORMAL LOW (ref 6.0–8.5)

## 2021-03-05 LAB — GROUP A STREP BY PCR: Group A Strep by PCR: NOT DETECTED

## 2021-03-05 LAB — C-REACTIVE PROTEIN: CRP: 2.9 mg/dL — ABNORMAL HIGH (ref <1.0)

## 2021-03-05 LAB — SEDIMENTATION RATE: Sed Rate: 68 mm/hr — ABNORMAL HIGH (ref 0–15)

## 2021-03-05 LAB — ANTISTREPTOLYSIN O TITER: ASO: 121 IU/mL (ref 0.0–200.0)

## 2021-03-05 MED ORDER — METHYLPREDNISOLONE SODIUM SUCC 125 MG IJ SOLR
60.0000 mg | Freq: Every day | INTRAMUSCULAR | Status: DC
  Administered 2021-03-05: 60 mg via INTRAVENOUS
  Filled 2021-03-05: qty 2

## 2021-03-05 NOTE — Consult Note (Signed)
Infectious Disease     Reason for Consult:Glomerulonephritis, flail valve    Referring Physician: Dr Candiss Norse Date of Admission:  03/02/2021   Principal Problem:   AKI (acute kidney injury) [facility]) Active Problems:   Anemia   Cardiac murmur   HPI: Jerome Mccormick is a 30 y.o. male admitted with HA and NV. He had been feeling poorly for a week or two with headaches nasal sxs, nose bleed, fever to 102, night sweats. He has lost 20#s in last few months. He has had some recent facial puffiness and LE edema. Had mild ant shin redness after being on his feet a lot the last few days.   He drinks heavily and smokes but denies any IVDU. Does use THC occasionally. He works at [employer] orders. He denies chest pain, skin changes, skin infections, diarrhea. Does have some constipation.  No dysuria, penile lesions. No recent dental work but has not been to dentist is some time.  Past Medical History:  Diagnosis Date  . Allergy   . Headache    History reviewed. No pertinent surgical history. Social History   Tobacco Use  . Smoking status: Current Every Day Smoker    Packs/day: 0.75    Types: Cigarettes  . Smokeless tobacco: Never Used  Substance Use Topics  . Alcohol use: Yes    Comment: >100 oz per week - beer  . Drug use: Yes    Types: Marijuana   History reviewed. No pertinent family history.  Allergies: No Known Allergies  Current antibiotics: Antibiotics Given (last 72 hours)    Date/Time Action Medication Dose Rate   03/02/21 2109 New Bag/Given   cefTRIAXone (ROCEPHIN) 1 g in sodium chloride 0.9 % 100 mL IVPB 1 g 200 mL/hr      MEDICATIONS: . folic acid  1 mg Oral Daily  . methylPREDNISolone (SOLU-MEDROL) injection  60 mg Intravenous Daily  . multivitamin with minerals  1 tablet Oral Daily  . nicotine  14 mg Transdermal Daily  . thiamine  100 mg Oral Daily   Or  . thiamine  100 mg Intravenous Daily    Review of Systems - 11 systems reviewed and negative per  HPI   OBJECTIVE: Temp:  [98 F (36.7 C)-99.8 F (37.7 C)] 98 F (36.7 C) (04/05 0839) Pulse Rate:  [72-94] 72 (04/05 0839) Resp:  [18-20] 18 (04/05 0839) BP: (127-134)/(77-88) 127/83 (04/05 0839) SpO2:  [99 %-100 %] 100 % (04/05 0839) Physical Exam  Constitutional: He is oriented to person, place, and time. He appears well-developed and well-nourished. No distress.  HENT: anicteric, Mouth/Throat: Oropharynx is clear and moist. No oropharyngeal exudate.  Cardiovascular: Normal rate, regular rhythm and normal heart sounds. 3/6 SM Pulmonary/Chest: Effort normal and breath sounds normal. No respiratory distress. He has no wheezes.  Abdominal: Soft. Bowel sounds are normal. He exhibits no distension. There is no tenderness.  Lymphadenopathy: He has no cervical adenopathy.  Neurological: He is alert and oriented to person, place, and time.  Skin: Skin is warm and dry. No rash noted. No erythema. No stigmata of endocarditis.  Psychiatric: He has a normal mood and affect. His behavior is normal.     LABS: Results for orders placed or performed during the hospital encounter of 03/02/21 (from the past 48 hour(s))  Protime-INR     Status: None   Collection Time: 03/04/21  6:33 AM  Result Value Ref Range   Prothrombin Time 14.8 11.4 - 15.2 seconds   INR 1.2 0.8 -  1.2    Comment: (NOTE) INR goal varies based on device and disease states. Performed at [facility], [address] [place], [place] [address]   Antistreptolysin O titer     Status: None   Collection Time: 03/04/21  6:33 AM  Result Value Ref Range   ASO 121.0 0.0 - 200.0 IU/mL    Comment: (NOTE) Performed At: [facility] [place], [place] [id number] Rush Farmer MD VX:[id number]   Renal function panel     Status: Abnormal   Collection Time: 03/04/21  6:33 AM  Result Value Ref Range   Sodium 133 (L) 135 - 145 mmol/L   Potassium 4.2 3.5 - 5.1 mmol/L   Chloride 103 98 - 111 mmol/L    CO2 21 (L) 22 - 32 mmol/L   Glucose, Bld 106 (H) 70 - 99 mg/dL    Comment: Glucose reference range applies only to samples taken after fasting for at least 8 hours.   BUN 73 (H) 6 - 20 mg/dL   Creatinine, Ser 4.35 (H) 0.61 - 1.24 mg/dL   Calcium 7.4 (L) 8.9 - 10.3 mg/dL   Phosphorus 5.6 (H) 2.5 - 4.6 mg/dL   Albumin 2.3 (L) 3.5 - 5.0 g/dL   GFR, Estimated 18 (L) >60 mL/min    Comment: (NOTE) Calculated using the CKD-EPI Creatinine Equation (2021)    Anion gap 9 5 - 15    Comment: Performed at [facility], [address] [place], [place] [address]  CBC     Status: Abnormal   Collection Time: 03/04/21  6:33 AM  Result Value Ref Range   WBC 7.0 4.0 - 10.5 K/uL   RBC 3.20 (L) 4.22 - 5.81 MIL/uL   Hemoglobin 9.0 (L) 13.0 - 17.0 g/dL   HCT 28.0 (L) 39.0 - 52.0 %   MCV 87.5 80.0 - 100.0 fL   MCH 28.1 26.0 - 34.0 pg   MCHC 32.1 30.0 - 36.0 g/dL   RDW 13.2 11.5 - 15.5 %   Platelets 295 150 - 400 K/uL   nRBC 0.0 0.0 - 0.2 %    Comment: Performed at [facility], [address] [place], [place] [address]  Renal function panel     Status: Abnormal   Collection Time: 03/05/21  4:54 AM  Result Value Ref Range   Sodium 132 (L) 135 - 145 mmol/L   Potassium 4.8 3.5 - 5.1 mmol/L   Chloride 103 98 - 111 mmol/L   CO2 23 22 - 32 mmol/L   Glucose, Bld 108 (H) 70 - 99 mg/dL    Comment: Glucose reference range applies only to samples taken after fasting for at least 8 hours.   BUN 72 (H) 6 - 20 mg/dL   Creatinine, Ser 4.22 (H) 0.61 - 1.24 mg/dL   Calcium 7.7 (L) 8.9 - 10.3 mg/dL   Phosphorus 5.7 (H) 2.5 - 4.6 mg/dL   Albumin 2.3 (L) 3.5 - 5.0 g/dL   GFR, Estimated 18 (L) >60 mL/min    Comment: (NOTE) Calculated using the CKD-EPI Creatinine Equation (2021)    Anion gap 6 5 - 15    Comment: Performed at [facility], [address] [place],  [address]   No components found for: ESR, C REACTIVE PROTEIN MICRO: Recent Results (from the past 720 hour(s))   Resp Panel by RT-PCR (Flu A&B, Covid) Nasopharyngeal Swab     Status: None   Collection Time: 03/02/21  4:31 PM   Specimen:  Nasopharyngeal Swab; Nasopharyngeal(NP) swabs in vial transport medium  Result Value Ref Range Status   SARS Coronavirus 2 by RT PCR NEGATIVE NEGATIVE Final    Comment: (NOTE) SARS-CoV-2 target nucleic acids are NOT DETECTED.  The SARS-CoV-2 RNA is generally detectable in upper respiratory specimens during the acute phase of infection. The lowest concentration of SARS-CoV-2 viral copies this assay can detect is 138 copies/mL. A negative result does not preclude SARS-Cov-2 infection and should not be used as the sole basis for treatment or other patient management decisions. A negative result may occur with  improper specimen collection/handling, submission of specimen other than nasopharyngeal swab, presence of viral mutation(s) within the areas targeted by this assay, and inadequate number of viral copies(<138 copies/mL). A negative result must be combined with clinical observations, patient history, and epidemiological information. The expected result is Negative.  Fact Sheet for Patients:  EntrepreneurPulse.com.au  Fact Sheet for Healthcare Providers:  IncredibleEmployment.be  This test is no t yet approved or cleared by the [place] FDA and  has been authorized for detection and/or diagnosis of SARS-CoV-2 by FDA under an Emergency Use Authorization (EUA). This EUA will remain  in effect (meaning this test can be used) for the duration of the COVID-19 declaration under Section 564(b)(1) of the Act, 21 U.S.C.section 360bbb-3(b)(1), unless the authorization is terminated  or revoked sooner.       Influenza A by PCR NEGATIVE NEGATIVE Final   Influenza B by PCR NEGATIVE NEGATIVE Final    Comment: (NOTE) The Xpert Xpress SARS-CoV-2/FLU/RSV plus assay is intended as an aid in the diagnosis of influenza from  Nasopharyngeal swab specimens and should not be used as a sole basis for treatment. Nasal washings and aspirates are unacceptable for Xpert Xpress SARS-CoV-2/FLU/RSV testing.  Fact Sheet for Patients: EntrepreneurPulse.com.au  Fact Sheet for Healthcare Providers: IncredibleEmployment.be  This test is not yet approved or cleared by the [place] FDA and has been authorized for detection and/or diagnosis of SARS-CoV-2 by FDA under an Emergency Use Authorization (EUA). This EUA will remain in effect (meaning this test can be used) for the duration of the COVID-19 declaration under Section 564(b)(1) of the Act, 21 U.S.C. section 360bbb-3(b)(1), unless the authorization is terminated or revoked.  Performed at [facility], [address] [place], [place] [address]     IMAGING: CT Head Wo Contrast  Result Date: 03/02/2021 CLINICAL DATA:  Headache. EXAM: CT HEAD WITHOUT CONTRAST TECHNIQUE: Contiguous axial images were obtained from the base of the skull through the vertex without intravenous contrast. COMPARISON:  12/15/2017. FINDINGS: Brain: No evidence of acute infarction, hemorrhage, hydrocephalus, extra-axial collection or mass lesion/mass effect. 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 Vascular: No hyperdense vessel identified. Skull: No acute fracture. Sinuses/Orbits: Minimal ethmoid air cell mucosal thickening. No air-fluid levels visualized. Unremarkable visualized orbits. Other: No mastoid effusion. IMPRESSION: No evidence of acute intracranial abnormality. Electronically Signed   By: Margaretha Sheffield MD   On: 03/02/2021 16:30   ECHOCARDIOGRAM COMPLETE  Result Date: 03/03/2021    ECHOCARDIOGRAM REPORT   Patient Name:   SHAURYA RAWDON Date of Exam: 03/03/2021 Medical Rec #:  [id number]      Height:       67.0 in Accession #:    [id number]     Weight:       138.0 lb Date of Birth:  19-Nov-1991       BSA:  1.727 m Patient Age:    30 years       BP:           114/79 mmHg Patient Gender: M              HR:           88 bpm. Exam Location:  [facility] Procedure: 2D Echo, Cardiac Doppler and Color Doppler Indications:     Murmur R01.1  History:         Patient has no prior history of Echocardiogram examinations.  Sonographer:     Alyse Low Roar Referring Phys:  [id number] Clarnce Flock Diagnosing Phys: Ida Rogue MD IMPRESSIONS  1. Left ventricular ejection fraction, by estimation, is 60 to 65%. The left ventricle has normal function. The left ventricle has no regional wall motion abnormalities. Left ventricular diastolic parameters were normal.  2. Right ventricular systolic function is normal. The right ventricular size is normal. There is mildly elevated pulmonary artery systolic pressure. The estimated right ventricular systolic pressure is 66.0 mmHg.  3. Left atrial size was moderately dilated.  4. The mitral valve is abnormal.Posterior leaflet with prolapse, unable to exclude partially flail leaflet or torn chordea (images 30-32) Moderate eccentric mitral valve regurgitation. No evidence of mitral stenosis.  5. The inferior vena cava is dilated in size with >50% respiratory variability, suggesting right atrial pressure of 8 mmHg. FINDINGS  Left Ventricle: Left ventricular ejection fraction, by estimation, is 60 to 65%. The left ventricle has normal function. The left ventricle has no regional wall motion abnormalities. The left ventricular internal cavity size was normal in size. There is  no left ventricular hypertrophy. Left ventricular diastolic parameters were normal. Right Ventricle: The right ventricular size is normal. No increase in right ventricular wall thickness. Right ventricular systolic function is normal. There is mildly elevated pulmonary artery systolic pressure. The tricuspid regurgitant velocity is 2.66  m/s, and with an assumed right atrial pressure of 10 mmHg, the estimated right  ventricular systolic pressure is 63.0 mmHg. Left Atrium: Left atrial size was moderately dilated. Right Atrium: Right atrial size was normal in size. Pericardium: There is no evidence of pericardial effusion. Mitral Valve: The mitral valve is abnormal.Posterior leaflet with prolapse, unable to exclude partially flail leaflet or torn chordea (images 30-32)Moderate mitral valve regurgitation. No evidence of mitral valve stenosis. Tricuspid Valve: The tricuspid valve is normal in structure. Tricuspid valve regurgitation is mild . No evidence of tricuspid stenosis. Aortic Valve: The aortic valve is normal in structure. Aortic valve regurgitation is not visualized. No aortic stenosis is present. Aortic valve peak gradient measures 7.2 mmHg. Pulmonic Valve: The pulmonic valve was normal in structure. Pulmonic valve regurgitation is not visualized. No evidence of pulmonic stenosis. Aorta: The aortic root is normal in size and structure. Venous: The inferior vena cava is dilated in size with greater than 50% respiratory variability, suggesting right atrial pressure of 8 mmHg. IAS/Shunts: No atrial level shunt detected by color flow Doppler.  LEFT VENTRICLE PLAX 2D LVIDd:         4.86 cm  Diastology LVIDs:         3.55 cm  LV e' medial:    12.40 cm/s LV PW:         1.10 cm  LV E/e' medial:  11.5 LV IVS:        0.95 cm  LV e' lateral:   17.30 cm/s LVOT diam:     2.00 cm  LV E/e' lateral: 8.2  LVOT Area:     3.14 cm  RIGHT VENTRICLE RV Mid diam:    3.43 cm RV S prime:     15.60 cm/s TAPSE (M-mode): 3.4 cm LEFT ATRIUM           Index       RIGHT ATRIUM           Index LA diam:      4.30 cm 2.49 cm/m  RA Area:     17.80 cm LA Vol (A2C): 97.9 ml 56.68 ml/m RA Volume:   50.50 ml  29.24 ml/m LA Vol (A4C): 52.4 ml 30.34 ml/m  AORTIC VALVE                PULMONIC VALVE AV Area (Vmax): 2.95 cm    PV Vmax:        1.18 m/s AV Vmax:        134.00 cm/s PV Peak grad:   5.6 mmHg AV Peak Grad:   7.2 mmHg    RVOT Peak grad: 1 mmHg LVOT  Vmax:      126.00 cm/s  AORTA Ao Root diam: 2.70 cm MITRAL VALVE                TRICUSPID VALVE MV Area (PHT): 3.02 cm     TR Peak grad:   28.3 mmHg MV Decel Time: 251 msec     TR Vmax:        266.00 cm/s MV E velocity: 142.00 cm/s MV A velocity: 103.00 cm/s  SHUNTS MV E/A ratio:  1.38         Systemic Diam: 2.00 cm MV A Prime:    10.4 cm/s Ida Rogue MD Electronically signed by Ida Rogue MD Signature Date/Time: 03/03/2021/6:08:05 PM    Final    CT Renal Stone Study  Result Date: 03/02/2021 CLINICAL DATA:  Renal parenchymal disease with hematuria EXAM: CT ABDOMEN AND PELVIS WITHOUT CONTRAST TECHNIQUE: Multidetector CT imaging of the abdomen and pelvis was performed following the standard protocol without IV contrast. COMPARISON:  07/02/2012 FINDINGS: Lower chest: No acute abnormality. Hepatobiliary: No focal hepatic abnormality. Gallbladder unremarkable. Pancreas: No focal abnormality or ductal dilatation. Spleen: No focal abnormality.  Normal size. Adrenals/Urinary Tract: No adrenal abnormality. No focal renal abnormality. No stones or hydronephrosis. Urinary bladder is unremarkable. Stomach/Bowel: Normal appendix. Stomach, large and small bowel grossly unremarkable. Vascular/Lymphatic: No evidence of aneurysm or adenopathy. Reproductive: No visible focal abnormality. Other: Small amount of free fluid noted adjacent to the liver and in the cul-de-sac. Musculoskeletal: No acute bony abnormality. IMPRESSION: No renal or ureteral stones.  No hydronephrosis. Small amount of free fluid in the abdomen and pelvis of unknown etiology. Electronically Signed   By: Rolm Baptise M.D.   On: 03/02/2021 20:38   US BIOPSY (KIDNEY)  Result Date: 03/04/2021 INDICATION: 30 year old male with a history of acute renal failure and 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 ANESTHESIA/SEDATION: Moderate (conscious) sedation was employed during this procedure. A total of Versed 2.0 mg and Fentanyl  100 mcg was administered intravenously. Moderate Sedation Time: 10 minutes. The patient's level of consciousness and vital signs were monitored continuously by radiology nursing throughout the procedure under my direct supervision. FLUOROSCOPY TIME:  Ultrasound COMPLICATIONS: None PROCEDURE: Informed written consent was obtained from the patient after a thorough discussion of the procedural risks, benefits and alternatives. All questions were addressed. Maximal Sterile Barrier Technique was utilized including caps, mask, sterile gowns, sterile gloves, sterile drape, hand hygiene and  skin antiseptic. A timeout was performed prior to the initiation of the procedure Patient was positioned prone position on the gantry table. Images were stored sent to PACs. Once the patient is prepped and draped in the usual sterile fashion, the skin and subcutaneous tissues overlying the left kidney were generously infiltrated 1% lidocaine for local anesthesia. Using ultrasound guidance, a 15 gauge guide needle was advanced into the lower cortex of the left kidney. Once we confirmed location of the needle tip, 2 separate 16 gauge core biopsy were achieved. Two Gel-Foam pledgets were infused with a small amount of saline. The needle was removed. Final images were stored. The patient tolerated the procedure well and remained hemodynamically stable throughout. No complications were encountered and no significant blood loss encountered. IMPRESSION: Status post ultrasound-guided biopsy of left kidney for medical renal purpose. Signed, Dulcy Fanny. Dellia Nims, RPVI [facility] Electronically Signed   By: Corrie Mckusick D.O.   On: 03/04/2021 12:11    Assessment:   Jerome Mccormick is a 30 y.o. 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  Elba Barman so far negative  for etiology of glomerulonephritis. He is HIV neg.  He did have prodromal sxs of fevers and chills for several weeks and I am concerned for SBE with associated glomerulonephritis. Strongly denies any IVDU so if has SBE possible oral source.  He did not have bcx done on admit and received IV abx in ED.  Recommendations Check BCX Hold on abx TEE in AM  Thank you very much for allowing me to participate in the care of this patient. Please call with questions.   Cheral Marker. Ola Spurr, MD

## 2021-03-05 NOTE — Progress Notes (Signed)
PROGRESS NOTE    Jerome Mccormick  LEX:[id number] DOB: December 23, 1990 DOA: 03/02/2021 PCP: Patient, No Pcp Per (Inactive)   Brief Narrative: Jerome Mccormick is a 30 y.o. male with no known medical history presented secondary to headaches and was found to have evidence of AKI on admission. Nephrology consulted.   Assessment & Plan:   Active Problems:   AKI (acute kidney injury) ([place])   Anemia   AKI Hematuria Proteinuria Baseline creatinine of 0.9. Creatinine of 4.31 on admission. Unsure of etiology. Concern for possible vasculitis on admission. Other considerations are NSAIDs or glomerulonephritis. Nephrology consulted. Anca antibodies, GBM antibodies, C3/C4 complement, kappa/lambda light chains, SPEP, antistreptolysin O titer pending. ANA positive. Ribonucleic Protein ab positive. Kidney biopsy performed successfully on 4/4. Creatinine is stable. -Nephrology recommendations: pathology results; no dialysis at this time  Anemia Appears to be likely chronic. No recent baseline. Hemoglobin of 8.8 on admission. Iron panel significant for possible anemia of chronic disease vs combination with iron deficiency. Stable.  Heart murmur Transthoracic Echocardiogram significant for a normal EF with no diastolic dysfunction. MV with possible prolapse and ? Flail leaflet and associate moderate MV regurgitation -Cardiology recommendations: Transesophageal Echocardiogram scheduled for 4/6  History of alcohol abuse -CIWA   DVT prophylaxis: SCDs Code Status:   Code Status: Full Code Family Communication: Friend at bedside Disposition Plan: Discharge home likely in several days pending continued workup/management, nephrology/cardiology recommendations   Consultants:   Nephrology  Cardiology  Procedures:   KIDNEY BIOPSY (03/04/2021)  Antimicrobials:  None    Subjective: Some left flank pain. Urinating well. No concerns. States his mood is okay.  Objective: Vitals:   03/04/21 2312  03/04/21 2323 03/05/21 0404 03/05/21 0839  BP: 131/85 131/80 134/79 127/83  Pulse: 82 81 94 72  Resp: 18 18 20 18   Temp: 99.4 F (37.4 C) 99.8 F (37.7 C) 99.3 F (37.4 C) 98 F (36.7 C)  TempSrc:  Oral Oral Oral  SpO2: 100% 100% 99% 100%  Weight:      Height:        Intake/Output Summary (Last 24 hours) at 03/05/2021 1347 Last data filed at 03/05/2021 0900 Gross per 24 hour  Intake 562 ml  Output 500 ml  Net 62 ml   Filed Weights   03/02/21 1533  Weight: 62.6 kg    Examination:  General exam: Appears calm and comfortable Respiratory system: Clear to auscultation. Respiratory effort normal. Cardiovascular system: S1 & S2 heard, RRR. 3/6 systolic murmur that radiates to left side and audible from his back Gastrointestinal system: Abdomen is nondistended, soft and nontender. No organomegaly or masses felt. Normal bowel sounds heard. Central nervous system: Alert and oriented. No focal neurological deficits. Musculoskeletal: No edema. No calf tenderness Skin: No cyanosis. No rashes Psychiatry: Judgement and insight appear normal. Depressed appearing mood.    Data Reviewed: I have personally reviewed following labs and imaging studies  CBC Lab Results  Component Value Date   WBC 7.0 03/04/2021   RBC 3.20 (L) 03/04/2021   HGB 9.0 (L) 03/04/2021   HCT 28.0 (L) 03/04/2021   MCV 87.5 03/04/2021   MCH 28.1 03/04/2021   PLT 295 03/04/2021   MCHC 32.1 03/04/2021   RDW 13.2 03/04/2021   LYMPHSABS 1.2 03/02/2021   MONOABS 0.7 03/02/2021   EOSABS 0.0 03/02/2021   BASOSABS 0.0 44/96/7591     Last metabolic panel Lab Results  Component Value Date   NA 132 (L) 03/05/2021   K 4.8 03/05/2021  CL 103 03/05/2021   CO2 23 03/05/2021   BUN 72 (H) 03/05/2021   CREATININE 4.22 (H) 03/05/2021   GLUCOSE 108 (H) 03/05/2021   GFRNONAA 18 (L) 03/05/2021   GFRAA >60 03/11/2014   CALCIUM 7.7 (L) 03/05/2021   PHOS 5.7 (H) 03/05/2021   PROT 6.5 03/02/2021   ALBUMIN 2.3 (L)  03/05/2021   BILITOT 0.4 03/02/2021   ALKPHOS 46 03/02/2021   AST 15 03/02/2021   ALT 12 03/02/2021   ANIONGAP 6 03/05/2021    CBG (last 3)  No results for input(s): GLUCAP in the last 72 hours.   GFR: Estimated Creatinine Clearance: 22.7 mL/min (A) (by C-G formula based on SCr of 4.22 mg/dL (H)).  Coagulation Profile: Recent Labs  Lab 03/04/21 0633  INR 1.2    Recent Results (from the past 240 hour(s))  Resp Panel by RT-PCR (Flu A&B, Covid) Nasopharyngeal Swab     Status: None   Collection Time: 03/02/21  4:31 PM   Specimen: Nasopharyngeal Swab; Nasopharyngeal(NP) swabs in vial transport medium  Result Value Ref Range Status   SARS Coronavirus 2 by RT PCR NEGATIVE NEGATIVE Final    Comment: (NOTE) SARS-CoV-2 target nucleic acids are NOT DETECTED.  The SARS-CoV-2 RNA is generally detectable in upper respiratory specimens during the acute phase of infection. The lowest concentration of SARS-CoV-2 viral copies this assay can detect is 138 copies/mL. A negative result does not preclude SARS-Cov-2 infection and should not be used as the sole basis for treatment or other patient management decisions. A negative result may occur with  improper specimen collection/handling, submission of specimen other than nasopharyngeal swab, presence of viral mutation(s) within the areas targeted by this assay, and inadequate number of viral copies(<138 copies/mL). A negative result must be combined with clinical observations, patient history, and epidemiological information. The expected result is Negative.  Fact Sheet for Patients:  EntrepreneurPulse.com.au  Fact Sheet for Healthcare Providers:  IncredibleEmployment.be  This test is no t yet approved or cleared by the [place] FDA and  has been authorized for detection and/or diagnosis of SARS-CoV-2 by FDA under an Emergency Use Authorization (EUA). This EUA will remain  in effect (meaning this  test can be used) for the duration of the COVID-19 declaration under Section 564(b)(1) of the Act, 21 U.S.C.section 360bbb-3(b)(1), unless the authorization is terminated  or revoked sooner.       Influenza A by PCR NEGATIVE NEGATIVE Final   Influenza B by PCR NEGATIVE NEGATIVE Final    Comment: (NOTE) The Xpert Xpress SARS-CoV-2/FLU/RSV plus assay is intended as an aid in the diagnosis of influenza from Nasopharyngeal swab specimens and should not be used as a sole basis for treatment. Nasal washings and aspirates are unacceptable for Xpert Xpress SARS-CoV-2/FLU/RSV testing.  Fact Sheet for Patients: EntrepreneurPulse.com.au  Fact Sheet for Healthcare Providers: IncredibleEmployment.be  This test is not yet approved or cleared by the [place] FDA and has been authorized for detection and/or diagnosis of SARS-CoV-2 by FDA under an Emergency Use Authorization (EUA). This EUA will remain in effect (meaning this test can be used) for the duration of the COVID-19 declaration under Section 564(b)(1) of the Act, 21 U.S.C. section 360bbb-3(b)(1), unless the authorization is terminated or revoked.  Performed at [facility], [address] [place], [place] [address]         Radiology Studies: ECHOCARDIOGRAM COMPLETE  Result Date: 03/03/2021    ECHOCARDIOGRAM REPORT   Patient Name:   ZARIAH JOST Date of  Exam: 03/03/2021 Medical Rec #:  [id number]      Height:       67.0 in Accession #:    [id number]     Weight:       138.0 lb Date of Birth:  18-Mar-1991      BSA:          1.727 m Patient Age:    30 years       BP:           114/79 mmHg Patient Gender: M              HR:           88 bpm. Exam Location:  [facility] Procedure: 2D Echo, Cardiac Doppler and Color Doppler Indications:     Murmur R01.1  History:         Patient has no prior history of Echocardiogram examinations.  Sonographer:     Alyse Low Roar Referring Phys:  [id number] Clarnce Flock Diagnosing Phys: Ida Rogue MD IMPRESSIONS  1. Left ventricular ejection fraction, by estimation, is 60 to 65%. The left ventricle has normal function. The left ventricle has no regional wall motion abnormalities. Left ventricular diastolic parameters were normal.  2. Right ventricular systolic function is normal. The right ventricular size is normal. There is mildly elevated pulmonary artery systolic pressure. The estimated right ventricular systolic pressure is 11.9 mmHg.  3. Left atrial size was moderately dilated.  4. The mitral valve is abnormal.Posterior leaflet with prolapse, unable to exclude partially flail leaflet or torn chordea (images 30-32) Moderate eccentric mitral valve regurgitation. No evidence of mitral stenosis.  5. The inferior vena cava is dilated in size with >50% respiratory variability, suggesting right atrial pressure of 8 mmHg. FINDINGS  Left Ventricle: Left ventricular ejection fraction, by estimation, is 60 to 65%. The left ventricle has normal function. The left ventricle has no regional wall motion abnormalities. The left ventricular internal cavity size was normal in size. There is  no left ventricular hypertrophy. Left ventricular diastolic parameters were normal. Right Ventricle: The right ventricular size is normal. No increase in right ventricular wall thickness. Right ventricular systolic function is normal. There is mildly elevated pulmonary artery systolic pressure. The tricuspid regurgitant velocity is 2.66  m/s, and with an assumed right atrial pressure of 10 mmHg, the estimated right ventricular systolic pressure is 41.7 mmHg. Left Atrium: Left atrial size was moderately dilated. Right Atrium: Right atrial size was normal in size. Pericardium: There is no evidence of pericardial effusion. Mitral Valve: The mitral valve is abnormal.Posterior leaflet with prolapse, unable to exclude partially flail leaflet or torn chordea (images 30-32)Moderate mitral valve  regurgitation. No evidence of mitral valve stenosis. Tricuspid Valve: The tricuspid valve is normal in structure. Tricuspid valve regurgitation is mild . No evidence of tricuspid stenosis. Aortic Valve: The aortic valve is normal in structure. Aortic valve regurgitation is not visualized. No aortic stenosis is present. Aortic valve peak gradient measures 7.2 mmHg. Pulmonic Valve: The pulmonic valve was normal in structure. Pulmonic valve regurgitation is not visualized. No evidence of pulmonic stenosis. Aorta: The aortic root is normal in size and structure. Venous: The inferior vena cava is dilated in size with greater than 50% respiratory variability, suggesting right atrial pressure of 8 mmHg. IAS/Shunts: No atrial level shunt detected by color flow Doppler.  LEFT VENTRICLE PLAX 2D LVIDd:         4.86 cm  Diastology LVIDs:  3.55 cm  LV e' medial:    12.40 cm/s LV PW:         1.10 cm  LV E/e' medial:  11.5 LV IVS:        0.95 cm  LV e' lateral:   17.30 cm/s LVOT diam:     2.00 cm  LV E/e' lateral: 8.2 LVOT Area:     3.14 cm  RIGHT VENTRICLE RV Mid diam:    3.43 cm RV S prime:     15.60 cm/s TAPSE (M-mode): 3.4 cm LEFT ATRIUM           Index       RIGHT ATRIUM           Index LA diam:      4.30 cm 2.49 cm/m  RA Area:     17.80 cm LA Vol (A2C): 97.9 ml 56.68 ml/m RA Volume:   50.50 ml  29.24 ml/m LA Vol (A4C): 52.4 ml 30.34 ml/m  AORTIC VALVE                PULMONIC VALVE AV Area (Vmax): 2.95 cm    PV Vmax:        1.18 m/s AV Vmax:        134.00 cm/s PV Peak grad:   5.6 mmHg AV Peak Grad:   7.2 mmHg    RVOT Peak grad: 1 mmHg LVOT Vmax:      126.00 cm/s  AORTA Ao Root diam: 2.70 cm MITRAL VALVE                TRICUSPID VALVE MV Area (PHT): 3.02 cm     TR Peak grad:   28.3 mmHg MV Decel Time: 251 msec     TR Vmax:        266.00 cm/s MV E velocity: 142.00 cm/s MV A velocity: 103.00 cm/s  SHUNTS MV E/A ratio:  1.38         Systemic Diam: 2.00 cm MV A Prime:    10.4 cm/s Julien Nordmann MD Electronically  signed by Julien Nordmann MD Signature Date/Time: 03/03/2021/6:08:05 PM    Final    US BIOPSY (KIDNEY)  Result Date: 03/04/2021 INDICATION: 30 year old male with a history of acute renal failure and 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 ANESTHESIA/SEDATION: Moderate (conscious) sedation was employed during this procedure. A total of Versed 2.0 mg and Fentanyl 100 mcg was administered intravenously. Moderate Sedation Time: 10 minutes. The patient's level of consciousness and vital signs were monitored continuously by radiology nursing throughout the procedure under my direct supervision. FLUOROSCOPY TIME:  Ultrasound COMPLICATIONS: None PROCEDURE: Informed written consent was obtained from the patient after a thorough discussion of the procedural risks, benefits and alternatives. All questions were addressed. Maximal Sterile Barrier Technique was utilized including caps, mask, sterile gowns, sterile gloves, sterile drape, hand hygiene and skin antiseptic. A timeout was performed prior to the initiation of the procedure Patient was positioned prone position on the gantry table. Images were stored sent to PACs. Once the patient is prepped and draped in the usual sterile fashion, the skin and subcutaneous tissues overlying the left kidney were generously infiltrated 1% lidocaine for local anesthesia. Using ultrasound guidance, a 15 gauge guide needle was advanced into the lower cortex of the left kidney. Once we confirmed location of the needle tip, 2 separate 16 gauge core biopsy were achieved. Two Gel-Foam pledgets were infused with a small amount of saline. The needle was removed. Final images  were stored. The patient tolerated the procedure well and remained hemodynamically stable throughout. No complications were encountered and no significant blood loss encountered. IMPRESSION: Status post ultrasound-guided biopsy of left kidney for medical renal purpose. Signed, Yvone Neu. Reyne Dumas, RPVI [facility] Electronically Signed   By: Gilmer Mor D.O.   On: 03/04/2021 12:11     Scheduled Meds: . folic acid  1 mg Oral Daily  . multivitamin with minerals  1 tablet Oral Daily  . nicotine  14 mg Transdermal Daily  . thiamine  100 mg Oral Daily   Or  . thiamine  100 mg Intravenous Daily   Continuous Infusions: . methocarbamol (ROBAXIN) IV       LOS: 3 days     Jacquelin Hawking, MD [facility] 03/05/2021, 1:47 PM  If 7PM-7AM, please contact night-coverage [email or web address]

## 2021-03-05 NOTE — Progress Notes (Signed)
Progress Note  Patient Name: Jerome Mccormick Date of Encounter: 03/05/2021  [facility] Cardiologist: [place]  Subjective   Patient is overall feeling well, wanting to go home. He had a headache last night improved with Tylenol. No chest pain or SOB.   Inpatient Medications    Scheduled Meds: . folic acid  1 mg Oral Daily  . multivitamin with minerals  1 tablet Oral Daily  . nicotine  14 mg Transdermal Daily  . thiamine  100 mg Oral Daily   Or  . thiamine  100 mg Intravenous Daily   Continuous Infusions: . methocarbamol (ROBAXIN) IV     PRN Meds: acetaminophen **OR** acetaminophen, LORazepam **OR** LORazepam, methocarbamol (ROBAXIN) IV, ondansetron **OR** ondansetron (ZOFRAN) IV, polyethylene glycol, traZODone   Vital Signs    Vitals:   03/04/21 2145 03/04/21 2312 03/04/21 2323 03/05/21 0404  BP: 133/77 131/85 131/80 134/79  Pulse: 80 82 81 94  Resp: 18 18 18 20   Temp: 98.5 F (36.9 C) 99.4 F (37.4 C) 99.8 F (37.7 C) 99.3 F (37.4 C)  TempSrc:   Oral Oral  SpO2: 100% 100% 100% 99%  Weight:      Height:        Intake/Output Summary (Last 24 hours) at 03/05/2021 0835 Last data filed at 03/05/2021 [id number] Gross per 24 hour  Intake 442 ml  Output 500 ml  Net -58 ml   Last 3 Weights 03/02/2021 12/15/2017 02/23/2014  Weight (lbs) 138 lb 145 lb 164 lb  Weight (kg) 62.596 kg 65.772 kg 74.39 kg      Telemetry    N/A - Personally Reviewed  ECG    No new tracing - Personally Reviewed  Physical Exam   GEN: No acute distress.   Neck: No JVD Cardiac: RRR, + murmur, no rubs, or gallops.  Respiratory: Clear to auscultation bilaterally. GI: Soft, nontender, non-distended  MS: No edema; No deformity. Neuro:  Nonfocal  Psych: Normal affect   Labs    High Sensitivity Troponin:  No results for input(s): TROPONINIHS in the last 720 hours.    Chemistry Recent Labs  Lab 03/02/21 1631 03/03/21 0552 03/04/21 0633 03/05/21 0454  NA 130* 133* 133* 132*  K 4.3  4.4 4.2 4.8  CL 98 103 103 103  CO2 24 21* 21* 23  GLUCOSE 97 115* 106* 108*  BUN 75* 75* 73* 72*  CREATININE 4.31* 4.10* 4.35* 4.22*  CALCIUM 7.2* 7.1* 7.4* 7.7*  PROT 6.5  --   --   --   ALBUMIN 2.7*  --  2.3* 2.3*  AST 15  --   --   --   ALT 12  --   --   --   ALKPHOS 46  --   --   --   BILITOT 0.4  --   --   --   GFRNONAA 18* 19* 18* 18*  ANIONGAP 8 9 9 6      Hematology Recent Labs  Lab 03/02/21 1631 03/03/21 0552 03/04/21 0633  WBC 7.6 9.5 7.0  RBC 3.12* 3.29* 3.20*  HGB 8.8* 9.2* 9.0*  HCT 27.5* 28.6* 28.0*  MCV 88.1 86.9 87.5  MCH 28.2 28.0 28.1  MCHC 32.0 32.2 32.1  RDW 13.4 13.2 13.2  PLT 309 319 295    BNPNo results for input(s): BNP, PROBNP in the last 168 hours.   DDimer No results for input(s): DDIMER in the last 168 hours.   Radiology    ECHOCARDIOGRAM COMPLETE  Result Date: 03/03/2021  ECHOCARDIOGRAM REPORT   Patient Name:   Jerome Mccormick Date of Exam: 03/03/2021 Medical Rec #:  [id number]      Height:       67.0 in Accession #:    [id number]     Weight:       138.0 lb Date of Birth:  02/04/1991      BSA:          1.727 m Patient Age:    30 years       BP:           114/79 mmHg Patient Gender: M              HR:           88 bpm. Exam Location:  [facility] Procedure: 2D Echo, Cardiac Doppler and Color Doppler Indications:     Murmur R01.1  History:         Patient has no prior history of Echocardiogram examinations.  Sonographer:     Neysa Bonito Roar Referring Phys:  [id number] Venora Maples Diagnosing Phys: Julien Nordmann MD IMPRESSIONS  1. Left ventricular ejection fraction, by estimation, is 60 to 65%. The left ventricle has normal function. The left ventricle has no regional wall motion abnormalities. Left ventricular diastolic parameters were normal.  2. Right ventricular systolic function is normal. The right ventricular size is normal. There is mildly elevated pulmonary artery systolic pressure. The estimated right ventricular systolic pressure is 38.3 mmHg.  3.  Left atrial size was moderately dilated.  4. The mitral valve is abnormal.Posterior leaflet with prolapse, unable to exclude partially flail leaflet or torn chordea (images 30-32) Moderate eccentric mitral valve regurgitation. No evidence of mitral stenosis.  5. The inferior vena cava is dilated in size with >50% respiratory variability, suggesting right atrial pressure of 8 mmHg. FINDINGS  Left Ventricle: Left ventricular ejection fraction, by estimation, is 60 to 65%. The left ventricle has normal function. The left ventricle has no regional wall motion abnormalities. The left ventricular internal cavity size was normal in size. There is  no left ventricular hypertrophy. Left ventricular diastolic parameters were normal. Right Ventricle: The right ventricular size is normal. No increase in right ventricular wall thickness. Right ventricular systolic function is normal. There is mildly elevated pulmonary artery systolic pressure. The tricuspid regurgitant velocity is 2.66  m/s, and with an assumed right atrial pressure of 10 mmHg, the estimated right ventricular systolic pressure is 38.3 mmHg. Left Atrium: Left atrial size was moderately dilated. Right Atrium: Right atrial size was normal in size. Pericardium: There is no evidence of pericardial effusion. Mitral Valve: The mitral valve is abnormal.Posterior leaflet with prolapse, unable to exclude partially flail leaflet or torn chordea (images 30-32)Moderate mitral valve regurgitation. No evidence of mitral valve stenosis. Tricuspid Valve: The tricuspid valve is normal in structure. Tricuspid valve regurgitation is mild . No evidence of tricuspid stenosis. Aortic Valve: The aortic valve is normal in structure. Aortic valve regurgitation is not visualized. No aortic stenosis is present. Aortic valve peak gradient measures 7.2 mmHg. Pulmonic Valve: The pulmonic valve was normal in structure. Pulmonic valve regurgitation is not visualized. No evidence of pulmonic  stenosis. Aorta: The aortic root is normal in size and structure. Venous: The inferior vena cava is dilated in size with greater than 50% respiratory variability, suggesting right atrial pressure of 8 mmHg. IAS/Shunts: No atrial level shunt detected by color flow Doppler.  LEFT VENTRICLE PLAX 2D LVIDd:  4.86 cm  Diastology LVIDs:         3.55 cm  LV e' medial:    12.40 cm/s LV PW:         1.10 cm  LV E/e' medial:  11.5 LV IVS:        0.95 cm  LV e' lateral:   17.30 cm/s LVOT diam:     2.00 cm  LV E/e' lateral: 8.2 LVOT Area:     3.14 cm  RIGHT VENTRICLE RV Mid diam:    3.43 cm RV S prime:     15.60 cm/s TAPSE (M-mode): 3.4 cm LEFT ATRIUM           Index       RIGHT ATRIUM           Index LA diam:      4.30 cm 2.49 cm/m  RA Area:     17.80 cm LA Vol (A2C): 97.9 ml 56.68 ml/m RA Volume:   50.50 ml  29.24 ml/m LA Vol (A4C): 52.4 ml 30.34 ml/m  AORTIC VALVE                PULMONIC VALVE AV Area (Vmax): 2.95 cm    PV Vmax:        1.18 m/s AV Vmax:        134.00 cm/s PV Peak grad:   5.6 mmHg AV Peak Grad:   7.2 mmHg    RVOT Peak grad: 1 mmHg LVOT Vmax:      126.00 cm/s  AORTA Ao Root diam: 2.70 cm MITRAL VALVE                TRICUSPID VALVE MV Area (PHT): 3.02 cm     TR Peak grad:   28.3 mmHg MV Decel Time: 251 msec     TR Vmax:        266.00 cm/s MV E velocity: 142.00 cm/s MV A velocity: 103.00 cm/s  SHUNTS MV E/A ratio:  1.38         Systemic Diam: 2.00 cm MV A Prime:    10.4 cm/s Julien Nordmann MD Electronically signed by Julien Nordmann MD Signature Date/Time: 03/03/2021/6:08:05 PM    Final    US BIOPSY (KIDNEY)  Result Date: 03/04/2021 INDICATION: 30 year old male with a history of acute renal failure and 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 ANESTHESIA/SEDATION: Moderate (conscious) sedation was employed during this procedure. A total of Versed 2.0 mg and Fentanyl 100 mcg was administered intravenously. Moderate Sedation Time: 10 minutes. The patient's level of  consciousness and vital signs were monitored continuously by radiology nursing throughout the procedure under my direct supervision. FLUOROSCOPY TIME:  Ultrasound COMPLICATIONS: None PROCEDURE: Informed written consent was obtained from the patient after a thorough discussion of the procedural risks, benefits and alternatives. All questions were addressed. Maximal Sterile Barrier Technique was utilized including caps, mask, sterile gowns, sterile gloves, sterile drape, hand hygiene and skin antiseptic. A timeout was performed prior to the initiation of the procedure Patient was positioned prone position on the gantry table. Images were stored sent to PACs. Once the patient is prepped and draped in the usual sterile fashion, the skin and subcutaneous tissues overlying the left kidney were generously infiltrated 1% lidocaine for local anesthesia. Using ultrasound guidance, a 15 gauge guide needle was advanced into the lower cortex of the left kidney. Once we confirmed location of the needle tip, 2 separate 16 gauge core biopsy were achieved. Two Gel-Foam pledgets were  infused with a small amount of saline. The needle was removed. Final images were stored. The patient tolerated the procedure well and remained hemodynamically stable throughout. No complications were encountered and no significant blood loss encountered. IMPRESSION: Status post ultrasound-guided biopsy of left kidney for medical renal purpose. Signed, Yvone Neu. Reyne Dumas, RPVI [facility] Inter[facility] Radiology Specialists [facility] Electronically Signed   By: Gilmer Mor D.O.   On: 03/04/2021 12:11    Cardiac Studies   Echo 03/04/21 1. Left ventricular ejection fraction, by estimation, is 60 to 65%. The  left ventricle has normal function. The left ventricle has no regional  wall motion abnormalities. Left ventricular diastolic parameters were  normal.  2. Right ventricular systolic function is normal. The right ventricular   size is normal. There is mildly elevated pulmonary artery systolic  pressure. The estimated right ventricular systolic pressure is 38.3 mmHg.  3. Left atrial size was moderately dilated.  4. The mitral valve is abnormal.Posterior leaflet with prolapse, unable  to exclude partially flail leaflet or torn chordea (images 30-32) Moderate  eccentric mitral valve regurgitation. No evidence of mitral stenosis.  5. The inferior vena cava is dilated in size with >50% respiratory  variability, suggesting right atrial pressure of 8 mmHg.    Patient Profile     30 y.o. male with a hx of tobacco use, alcohol abuse, and migraines who is being seen today for the evaluation of abnormal echo findings.  Assessment & Plan    AKI Hematuria Proteinuria - Found to have severe AKI with creatinine of 4.31. 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  - No signs of volume overload. Murmur on exam - Plan for TEE, likely tomorrow to further examine MR. NPO at midnight.  Anemia - suspected related to kidney injury, stable today  History of alcohol use - CIWA  Tobacco use -cessation discussed  For questions or updates, please contact [facility] Please consult [email or web address] for contact info under        Signed, Kenechukwu Eckstein David Stall, PA-C  03/05/2021, 8:35 AM

## 2021-03-05 NOTE — Progress Notes (Signed)
[place], [place] 03/05/21  Subjective:   LOS: 3  Jerome Mccormick is a 30 y.o. male with an extensive history of NSAID use and alcohol use. He presents to ED with 7 days of headaches and congestion. He is found to increased Creatinine 4.31.  Patient seen this morning resting in bed Currently NPO for procedure Denies shortness of breath and nausea     Objective:  Vital signs in last 24 hours:  Temp:  [98 F (36.7 C)-99.8 F (37.7 C)] 98 F (36.7 C) (04/05 0839) Pulse Rate:  [72-94] 72 (04/05 0839) Resp:  [18-20] 18 (04/05 0839) BP: (127-134)/(77-88) 127/83 (04/05 0839) SpO2:  [99 %-100 %] 100 % (04/05 0839)  Weight change:  Filed Weights   03/02/21 1533  Weight: 62.6 kg    Intake/Output:    Intake/Output Summary (Last 24 hours) at 03/05/2021 1313 Last data filed at 03/05/2021 0900 Gross per 24 hour  Intake 562 ml  Output 500 ml  Net 62 ml     Physical Exam: General: NAD, resting in bed  HEENT Anicteric, moist oral mucosa  Pulm/lungs Clear bilaterally, normal breathing pattern  CVS/Heart S1S2 present, regular rate  Abdomen:  Soft, non tender  Extremities: No peripheral edema  Neurologic: Alert, oriented, able to answer questions  Skin: No rashes or masses          Basic Metabolic Panel:  Recent Labs  Lab 03/02/21 1631 03/03/21 0552 03/04/21 0633 03/05/21 0454  NA 130* 133* 133* 132*  K 4.3 4.4 4.2 4.8  CL 98 103 103 103  CO2 24 21* 21* 23  GLUCOSE 97 115* 106* 108*  BUN 75* 75* 73* 72*  CREATININE 4.31* 4.10* 4.35* 4.22*  CALCIUM 7.2* 7.1* 7.4* 7.7*  PHOS  --   --  5.6* 5.7*     CBC: Recent Labs  Lab 03/02/21 1631 03/03/21 0552 03/04/21 0633  WBC 7.6 9.5 7.0  NEUTROABS 5.7  --   --   HGB 8.8* 9.2* 9.0*  HCT 27.5* 28.6* 28.0*  MCV 88.1 86.9 87.5  PLT 309 319 295      Lab Results  Component Value Date   HEPBSAG NON REACTIVE 03/02/2021   HEPBSAB NON REACTIVE 03/02/2021      Microbiology:  Recent  Results (from the past 240 hour(s))  Resp Panel by RT-PCR (Flu A&B, Covid) Nasopharyngeal Swab     Status: None   Collection Time: 03/02/21  4:31 PM   Specimen: Nasopharyngeal Swab; Nasopharyngeal(NP) swabs in vial transport medium  Result Value Ref Range Status   SARS Coronavirus 2 by RT PCR NEGATIVE NEGATIVE Final    Comment: (NOTE) SARS-CoV-2 target nucleic acids are NOT DETECTED.  The SARS-CoV-2 RNA is generally detectable in upper respiratory specimens during the acute phase of infection. The lowest concentration of SARS-CoV-2 viral copies this assay can detect is 138 copies/mL. A negative result does not preclude SARS-Cov-2 infection and should not be used as the sole basis for treatment or other patient management decisions. A negative result may occur with  improper specimen collection/handling, submission of specimen other than nasopharyngeal swab, presence of viral mutation(s) within the areas targeted by this assay, and inadequate number of viral copies(<138 copies/mL). A negative result must be combined with clinical observations, patient history, and epidemiological information. The expected result is Negative.  Fact Sheet for Patients:  EntrepreneurPulse.com.au  Fact Sheet for Healthcare Providers:  IncredibleEmployment.be  This test is no t yet approved or cleared by the [place]  FDA and  has been authorized for detection and/or diagnosis of SARS-CoV-2 by FDA under an Emergency Use Authorization (EUA). This EUA will remain  in effect (meaning this test can be used) for the duration of the COVID-19 declaration under Section 564(b)(1) of the Act, 21 U.S.C.section 360bbb-3(b)(1), unless the authorization is terminated  or revoked sooner.       Influenza A by PCR NEGATIVE NEGATIVE Final   Influenza B by PCR NEGATIVE NEGATIVE Final    Comment: (NOTE) The Xpert Xpress SARS-CoV-2/FLU/RSV plus assay is intended as an aid in the  diagnosis of influenza from Nasopharyngeal swab specimens and should not be used as a sole basis for treatment. Nasal washings and aspirates are unacceptable for Xpert Xpress SARS-CoV-2/FLU/RSV testing.  Fact Sheet for Patients: EntrepreneurPulse.com.au  Fact Sheet for Healthcare Providers: IncredibleEmployment.be  This test is not yet approved or cleared by the [place] FDA and has been authorized for detection and/or diagnosis of SARS-CoV-2 by FDA under an Emergency Use Authorization (EUA). This EUA will remain in effect (meaning this test can be used) for the duration of the COVID-19 declaration under Section 564(b)(1) of the Act, 21 U.S.C. section 360bbb-3(b)(1), unless the authorization is terminated or revoked.  Performed at [facility], [address] [place], [place] [address]     Coagulation Studies: Recent Labs    03/04/21 0768  LABPROT 14.8  INR 1.2    Urinalysis: Recent Labs    03/02/21 [place] 1.012  PHURINE 5.0  GLUCOSEU NEGATIVE  HGBUR LARGE*  BILIRUBINUR NEGATIVE  KETONESUR NEGATIVE  PROTEINUR >=300*  NITRITE NEGATIVE  LEUKOCYTESUR MODERATE*      Imaging: ECHOCARDIOGRAM COMPLETE  Result Date: 03/03/2021    ECHOCARDIOGRAM REPORT   Patient Name:   Jerome Mccormick Date of Exam: 03/03/2021 Medical Rec #:  [id number]      Height:       67.0 in Accession #:    [id number]     Weight:       138.0 lb Date of Birth:  08/24/1991      BSA:          1.727 m Patient Age:    30 years       BP:           114/79 mmHg Patient Gender: M              HR:           88 bpm. Exam Location:  [facility] Procedure: 2D Echo, Cardiac Doppler and Color Doppler Indications:     Murmur R01.1  History:         Patient has no prior history of Echocardiogram examinations.  Sonographer:     Jerome Mccormick Referring Phys:  [id number] Jerome Mccormick Diagnosing Phys: Jerome Rogue MD IMPRESSIONS  1. Left ventricular  ejection fraction, by estimation, is 60 to 65%. The left ventricle has normal function. The left ventricle has no regional wall motion abnormalities. Left ventricular diastolic parameters were normal.  2. Right ventricular systolic function is normal. The right ventricular size is normal. There is mildly elevated pulmonary artery systolic pressure. The estimated right ventricular systolic pressure is 17.7 mmHg.  3. Left atrial size was moderately dilated.  4. The mitral valve is abnormal.Posterior leaflet with prolapse, unable to exclude partially flail leaflet or torn chordea (images 30-32) Moderate eccentric mitral valve regurgitation. No evidence of mitral stenosis.  5. The inferior vena cava is dilated in size with >  50% respiratory variability, suggesting right atrial pressure of 8 mmHg. FINDINGS  Left Ventricle: Left ventricular ejection fraction, by estimation, is 60 to 65%. The left ventricle has normal function. The left ventricle has no regional wall motion abnormalities. The left ventricular internal cavity size was normal in size. There is  no left ventricular hypertrophy. Left ventricular diastolic parameters were normal. Right Ventricle: The right ventricular size is normal. No increase in right ventricular wall thickness. Right ventricular systolic function is normal. There is mildly elevated pulmonary artery systolic pressure. The tricuspid regurgitant velocity is 2.66  m/s, and with an assumed right atrial pressure of 10 mmHg, the estimated right ventricular systolic pressure is 78.6 mmHg. Left Atrium: Left atrial size was moderately dilated. Right Atrium: Right atrial size was normal in size. Pericardium: There is no evidence of pericardial effusion. Mitral Valve: The mitral valve is abnormal.Posterior leaflet with prolapse, unable to exclude partially flail leaflet or torn chordea (images 30-32)Moderate mitral valve regurgitation. No evidence of mitral valve stenosis. Tricuspid Valve: The tricuspid  valve is normal in structure. Tricuspid valve regurgitation is mild . No evidence of tricuspid stenosis. Aortic Valve: The aortic valve is normal in structure. Aortic valve regurgitation is not visualized. No aortic stenosis is present. Aortic valve peak gradient measures 7.2 mmHg. Pulmonic Valve: The pulmonic valve was normal in structure. Pulmonic valve regurgitation is not visualized. No evidence of pulmonic stenosis. Aorta: The aortic root is normal in size and structure. Venous: The inferior vena cava is dilated in size with greater than 50% respiratory variability, suggesting right atrial pressure of 8 mmHg. IAS/Shunts: No atrial level shunt detected by color flow Doppler.  LEFT VENTRICLE PLAX 2D LVIDd:         4.86 cm  Diastology LVIDs:         3.55 cm  LV e' medial:    12.40 cm/s LV PW:         1.10 cm  LV E/e' medial:  11.5 LV IVS:        0.95 cm  LV e' lateral:   17.30 cm/s LVOT diam:     2.00 cm  LV E/e' lateral: 8.2 LVOT Area:     3.14 cm  RIGHT VENTRICLE RV Mid diam:    3.43 cm RV S prime:     15.60 cm/s TAPSE (M-mode): 3.4 cm LEFT ATRIUM           Index       RIGHT ATRIUM           Index LA diam:      4.30 cm 2.49 cm/m  RA Area:     17.80 cm LA Vol (A2C): 97.9 ml 56.68 ml/m RA Volume:   50.50 ml  29.24 ml/m LA Vol (A4C): 52.4 ml 30.34 ml/m  AORTIC VALVE                PULMONIC VALVE AV Area (Vmax): 2.95 cm    PV Vmax:        1.18 m/s AV Vmax:        134.00 cm/s PV Peak grad:   5.6 mmHg AV Peak Grad:   7.2 mmHg    RVOT Peak grad: 1 mmHg LVOT Vmax:      126.00 cm/s  AORTA Ao Root diam: 2.70 cm MITRAL VALVE                TRICUSPID VALVE MV Area (PHT): 3.02 cm     TR Peak grad:   28.3  mmHg MV Decel Time: 251 msec     TR Vmax:        266.00 cm/s MV E velocity: 142.00 cm/s MV A velocity: 103.00 cm/s  SHUNTS MV E/A ratio:  1.38         Systemic Diam: 2.00 cm MV A Prime:    10.4 cm/s Jerome Rogue MD Electronically signed by Jerome Rogue MD Signature Date/Time: 03/03/2021/6:08:05 PM    Final    US  BIOPSY (KIDNEY)  Result Date: 03/04/2021 INDICATION: 29 year old male with a history of acute renal failure and 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 ANESTHESIA/SEDATION: Moderate (conscious) sedation was employed during this procedure. A total of Versed 2.0 mg and Fentanyl 100 mcg was administered intravenously. Moderate Sedation Time: 10 minutes. The patient's level of consciousness and vital signs were monitored continuously by radiology nursing throughout the procedure under my direct supervision. FLUOROSCOPY TIME:  Ultrasound COMPLICATIONS: None PROCEDURE: Informed written consent was obtained from the patient after a thorough discussion of the procedural risks, benefits and alternatives. All questions were addressed. Maximal Sterile Barrier Technique was utilized including caps, mask, sterile gowns, sterile gloves, sterile drape, hand hygiene and skin antiseptic. A timeout was performed prior to the initiation of the procedure Patient was positioned prone position on the gantry table. Images were stored sent to PACs. Once the patient is prepped and draped in the usual sterile fashion, the skin and subcutaneous tissues overlying the left kidney were generously infiltrated 1% lidocaine for local anesthesia. Using ultrasound guidance, a 15 gauge guide needle was advanced into the lower cortex of the left kidney. Once we confirmed location of the needle tip, 2 separate 16 gauge core biopsy were achieved. Two Gel-Foam pledgets were infused with a small amount of saline. The needle was removed. Final images were stored. The patient tolerated the procedure well and remained hemodynamically stable throughout. No complications were encountered and no significant blood loss encountered. IMPRESSION: Status post ultrasound-guided biopsy of left kidney for medical renal purpose. Signed, Dulcy Fanny. Dellia Nims, RPVI Vascular and Interventional Radiology Specialists [facility] Rad[facility]  Electronically Signed   By: Corrie Mckusick D.O.   On: 03/04/2021 12:11     Medications:   . methocarbamol (ROBAXIN) IV     . folic acid  1 mg Oral Daily  . multivitamin with minerals  1 tablet Oral Daily  . nicotine  14 mg Transdermal Daily  . thiamine  100 mg Oral Daily   Or  . thiamine  100 mg Intravenous Daily   acetaminophen **OR** acetaminophen, LORazepam **OR** LORazepam, methocarbamol (ROBAXIN) IV, ondansetron **OR** ondansetron (ZOFRAN) IV, polyethylene glycol, traZODone  Assessment/ Plan:  30 y.o. male with  was admitted on 03/02/2021 for  Active Problems:   AKI (acute kidney injury) ([place])   Anemia  AKI (acute kidney injury) ([place]) [N17.9] Acute kidney injury ([place]) [N17.9]  #. Acute kidney injury. Baseline creatinine 0.96/EGFR >60 on April 2015 - Creatinine 4.22/EGFR 18 - Continues to void regularly -Renal biopsy completed yesterday - results pending - no acute need for dialysis at this time  #. Anemia, iron defiency   Lab Results  Component Value Date   HGB 9.0 (L) 03/04/2021   Will continue to monitor  #. Hematuria - reports clear yellow urine - avoid heparin and aspirin - Renal biopsy completed 03/04/21  # Headaches -CT head negative - Tylenol for mild pain   LOS: [place] 4/5/20221:13 PM  [place], [place]

## 2021-03-05 NOTE — Progress Notes (Signed)
    [facility] has been requested to perform a transesophageal echocardiogram on Jerome Mccormick for Mitral valve disease.  After careful review of history and examination, the risks and benefits of transesophageal echocardiogram have been explained including risks of esophageal damage, perforation (1:10,000 risk), bleeding, pharyngeal hematoma as well as other potential complications associated with conscious sedation including aspiration, arrhythmia, respiratory failure and death. Alternatives to treatment were discussed, questions were answered. Patient is willing to proceed.   Jerome Mccormick David Stall, PA-C  03/05/2021 1:53 PM

## 2021-03-06 ENCOUNTER — Inpatient Hospital Stay (HOSPITAL_COMMUNITY)
Admit: 2021-03-06 | Discharge: 2021-03-06 | Disposition: A | Discharge status: Home / Self Care | Payer: Medicaid Other | Attending: Medical | Admitting: Medical

## 2021-03-06 ENCOUNTER — Encounter
Admission: EM | Disposition: A | Discharge status: Home Health | Payer: Self-pay | Source: Home / Self Care | Attending: Family Medicine

## 2021-03-06 ENCOUNTER — Inpatient Hospital Stay: Payer: Medicaid Other

## 2021-03-06 DIAGNOSIS — R7881 Bacteremia: Secondary | ICD-10-CM

## 2021-03-06 DIAGNOSIS — I340 Nonrheumatic mitral (valve) insufficiency: Secondary | ICD-10-CM

## 2021-03-06 HISTORY — PX: TEE WITHOUT CARDIOVERSION: SHX5443

## 2021-03-06 LAB — BLOOD CULTURE ID PANEL (REFLEXED) - BCID2

## 2021-03-06 LAB — RENAL FUNCTION PANEL
Albumin: 2.5 g/dL — ABNORMAL LOW (ref 3.5–5.0)
Anion gap: 8 (ref 5–15)
BUN: 79 mg/dL — ABNORMAL HIGH (ref 6–20)
CO2: 23 mmol/L (ref 22–32)
Calcium: 8.4 mg/dL — ABNORMAL LOW (ref 8.9–10.3)
Chloride: 102 mmol/L (ref 98–111)
Creatinine, Ser: 5 mg/dL — ABNORMAL HIGH (ref 0.61–1.24)
GFR, Estimated: 15 mL/min — ABNORMAL LOW (ref >60)
Glucose, Bld: 143 mg/dL — ABNORMAL HIGH (ref 70–99)
Phosphorus: 7.7 mg/dL — ABNORMAL HIGH (ref 2.5–4.6)
Potassium: 5.9 mmol/L — ABNORMAL HIGH (ref 3.5–5.1)
Sodium: 133 mmol/L — ABNORMAL LOW (ref 135–145)

## 2021-03-06 LAB — CBC
HCT: 31.1 % — ABNORMAL LOW (ref 39.0–52.0)
Hemoglobin: 9.9 g/dL — ABNORMAL LOW (ref 13.0–17.0)
MCH: 27.3 pg (ref 26.0–34.0)
MCHC: 31.8 g/dL (ref 30.0–36.0)
MCV: 85.9 fL (ref 80.0–100.0)
Platelets: 287 10*3/uL (ref 150–400)
RBC: 3.62 MIL/uL — ABNORMAL LOW (ref 4.22–5.81)
RDW: 13.1 % (ref 11.5–15.5)
WBC: 6.3 10*3/uL (ref 4.0–10.5)
nRBC: 0 % (ref 0.0–0.2)

## 2021-03-06 LAB — GLOMERULAR BASEMENT MEMBRANE ANTIBODIES: GBM Ab: 3 units (ref 0–20)

## 2021-03-06 LAB — MISC LABCORP TEST (SEND OUT): Labcorp test code: 141330

## 2021-03-06 LAB — C3 COMPLEMENT: C3 Complement: 51 mg/dL — ABNORMAL LOW (ref 82–167)

## 2021-03-06 LAB — C4 COMPLEMENT: Complement C4, Body Fluid: 15 mg/dL (ref 12–38)

## 2021-03-06 SURGERY — ECHOCARDIOGRAM, TRANSESOPHAGEAL
Anesthesia: Moderate Sedation

## 2021-03-06 MED ORDER — FENTANYL CITRATE (PF) 100 MCG/2ML IJ SOLN
INTRAMUSCULAR | Status: AC
  Filled 2021-03-06: qty 2

## 2021-03-06 MED ORDER — FENTANYL CITRATE (PF) 100 MCG/2ML IJ SOLN
INTRAMUSCULAR | Status: AC | PRN
  Administered 2021-03-06 (×2): 25 ug via INTRAVENOUS

## 2021-03-06 MED ORDER — SODIUM CHLORIDE 0.9 % IV SOLN
2.0000 g | Freq: Three times a day (TID) | INTRAVENOUS | Status: DC
  Administered 2021-03-06 – 2021-03-11 (×15): 2 g via INTRAVENOUS
  Filled 2021-03-06 (×2): qty 2000
  Filled 2021-03-06: qty 2
  Filled 2021-03-06 (×2): qty 2000
  Filled 2021-03-06: qty 2
  Filled 2021-03-06: qty 2000
  Filled 2021-03-06 (×3): qty 2
  Filled 2021-03-06: qty 2000
  Filled 2021-03-06: qty 2
  Filled 2021-03-06 (×3): qty 2000
  Filled 2021-03-06: qty 2

## 2021-03-06 MED ORDER — SODIUM CHLORIDE FLUSH 0.9 % IV SOLN
INTRAVENOUS | Status: AC
  Filled 2021-03-06: qty 10

## 2021-03-06 MED ORDER — SODIUM CHLORIDE 0.9 % IV SOLN: INTRAVENOUS | Status: DC

## 2021-03-06 MED ORDER — BUTAMBEN-TETRACAINE-BENZOCAINE 2-2-14 % EX AERO
INHALATION_SPRAY | CUTANEOUS | Status: AC
  Filled 2021-03-06: qty 5

## 2021-03-06 MED ORDER — MIDAZOLAM HCL 5 MG/5ML IJ SOLN
INTRAMUSCULAR | Status: AC
  Filled 2021-03-06: qty 5

## 2021-03-06 MED ORDER — IOHEXOL 9 MG/ML PO SOLN
500.0000 mL | ORAL | Status: AC
  Administered 2021-03-06 (×2): 500 mL via ORAL

## 2021-03-06 MED ORDER — MIDAZOLAM HCL 2 MG/2ML IJ SOLN
INTRAMUSCULAR | Status: AC | PRN
  Administered 2021-03-06: 1 mg via INTRAVENOUS

## 2021-03-06 MED ORDER — LORAZEPAM 2 MG/ML IJ SOLN: INTRAMUSCULAR | Status: AC | PRN

## 2021-03-06 MED ORDER — SODIUM CHLORIDE 0.9 % IV SOLN
2.0000 g | Freq: Two times a day (BID) | INTRAVENOUS | Status: DC
  Administered 2021-03-06 – 2021-03-11 (×10): 2 g via INTRAVENOUS
  Filled 2021-03-06 (×4): qty 20
  Filled 2021-03-06: qty 2
  Filled 2021-03-06 (×2): qty 20
  Filled 2021-03-06 (×2): qty 2
  Filled 2021-03-06 (×2): qty 20

## 2021-03-06 MED ORDER — MIDAZOLAM HCL 5 MG/5ML IJ SOLN
INTRAMUSCULAR | Status: AC | PRN
  Administered 2021-03-06 (×2): 1 mg via INTRAVENOUS

## 2021-03-06 MED ORDER — SODIUM ZIRCONIUM CYCLOSILICATE 10 G PO PACK
10.0000 g | PACK | Freq: Once | ORAL | Status: AC
  Administered 2021-03-06: 10 g via ORAL
  Filled 2021-03-06 (×2): qty 1

## 2021-03-06 MED ORDER — LIDOCAINE VISCOUS HCL 2 % MT SOLN
OROMUCOSAL | Status: AC
  Administered 2021-03-06: 15 mL
  Filled 2021-03-06: qty 15

## 2021-03-06 NOTE — Progress Notes (Addendum)
[facility] Date of Admission:  03/02/2021     ID: Jerome Mccormick is a 30 y.o. male with ARF. Principal Problem:   AKI (acute kidney injury) (HCC) Active Problems:   Anemia   Cardiac murmur   Nonrheumatic mitral valve regurgitation   Subjective: BCX + enterococcus.  Started ampicillin. TEE with mod MR and flail/ruptured posterior chordae. Being referred for CT surg eval  ROS  Eleven systems are reviewed and negative except per hpi  Medications:  Antibiotics Given (last 72 hours)    Date/Time Action Medication Dose Rate   03/06/21 1415 New Bag/Given   ampicillin (OMNIPEN) 2 g in sodium chloride 0.9 % 100 mL IVPB 2 g 300 mL/hr     . butamben-tetracaine-benzocaine      . butamben-tetracaine-benzocaine      . fentaNYL      . folic acid  1 mg Oral Daily  . methylPREDNISolone (SOLU-MEDROL) injection  60 mg Intravenous Daily  . midazolam      . multivitamin with minerals  1 tablet Oral Daily  . nicotine  14 mg Transdermal Daily  . sodium chloride flush      . sodium zirconium cyclosilicate  10 g Oral Once  . thiamine  100 mg Oral Daily   Or  . thiamine  100 mg Intravenous Daily    Objective: Vital signs in last 24 hours: Temp:  [97.4 F (36.3 C)-98.3 F (36.8 C)] 97.6 F (36.4 C) (04/06 1129) Pulse Rate:  [57-113] 72 (04/06 1129) Resp:  [17-25] 20 (04/06 1129) BP: (114-144)/(64-85) 115/66 (04/06 1129) SpO2:  [99 %-100 %] 99 % (04/06 1045) Weight:  [59.9 kg] 59.9 kg (04/06 0859) Constitutional: He is oriented to person, place, and time. He appears well-developed and well-nourished. No distress.  HENT: anicteric, Mouth/Throat: Oropharynx is clear and moist. No oropharyngeal exudate.  Cardiovascular: Normal rate, regular rhythm and normal heart sounds. 3/6 SM Pulmonary/Chest: Effort normal and breath sounds normal. No respiratory distress. He has no wheezes.  Abdominal: Soft. Bowel sounds are normal. He exhibits no distension. There  is no tenderness.  Lymphadenopathy: He has no cervical adenopathy.  Neurological: He is alert and oriented to person, place, and time.  Skin: Skin is warm and dry. No rash noted. No erythema. No stigmata of endocarditis.  Psychiatric: He has a normal mood and affect. His behavior is normal.   Lab Results Recent Labs    03/04/21 0633 03/05/21 0454 03/06/21 0444  WBC 7.0  --  6.3  HGB 9.0*  --  9.9*  HCT 28.0*  --  31.1*  NA 133* 132* 133*  K 4.2 4.8 5.9*  CL 103 103 102  CO2 21* 23 23  BUN 73* 72* 79*  CREATININE 4.35* 4.22* 5.00*    Microbiology: Results for orders placed or performed during the hospital encounter of 03/02/21  Resp Panel by RT-PCR (Flu A&B, Covid) Nasopharyngeal Swab     Status: None   Collection Time: 03/02/21  4:31 PM   Specimen: Nasopharyngeal Swab; Nasopharyngeal(NP) swabs in vial transport medium  Result Value Ref Range Status   SARS Coronavirus 2 by RT PCR NEGATIVE NEGATIVE Final    Comment: (NOTE) SARS-CoV-2 target nucleic acids are NOT DETECTED.  The SARS-CoV-2 RNA is generally detectable in upper respiratory specimens during the acute phase of infection. The lowest concentration of SARS-CoV-2 viral copies this assay can detect is 138 copies/mL. A negative result does not preclude SARS-Cov-2 infection and should not be used as  the sole basis for treatment or other patient management decisions. A negative result may occur with  improper specimen collection/handling, submission of specimen other than nasopharyngeal swab, presence of viral mutation(s) within the areas targeted by this assay, and inadequate number of viral copies(<138 copies/mL). A negative result must be combined with clinical observations, patient history, and epidemiological information. The expected result is Negative.  Fact Sheet for Patients:  BloggerCourse.com  Fact Sheet for Healthcare Providers:  SeriousBroker.it  This  test is no t yet approved or cleared by the [place] FDA and  has been authorized for detection and/or diagnosis of SARS-CoV-2 by FDA under an Emergency Use Authorization (EUA). This EUA will remain  in effect (meaning this test can be used) for the duration of the COVID-19 declaration under Section 564(b)(1) of the Act, 21 U.S.C.section 360bbb-3(b)(1), unless the authorization is terminated  or revoked sooner.       Influenza A by PCR NEGATIVE NEGATIVE Final   Influenza B by PCR NEGATIVE NEGATIVE Final    Comment: (NOTE) The Xpert Xpress SARS-CoV-2/FLU/RSV plus assay is intended as an aid in the diagnosis of influenza from Nasopharyngeal swab specimens and should not be used as a sole basis for treatment. Nasal washings and aspirates are unacceptable for Xpert Xpress SARS-CoV-2/FLU/RSV testing.  Fact Sheet for Patients: BloggerCourse.com  Fact Sheet for Healthcare Providers: SeriousBroker.it  This test is not yet approved or cleared by the [place] FDA and has been authorized for detection and/or diagnosis of SARS-CoV-2 by FDA under an Emergency Use Authorization (EUA). This EUA will remain in effect (meaning this test can be used) for the duration of the COVID-19 declaration under Section 564(b)(1) of the Act, 21 U.S.C. section 360bbb-3(b)(1), unless the authorization is terminated or revoked.  Performed at [facility], [address] [place], [place] [address]   Group A Strep by PCR     Status: None   Collection Time: 03/05/21  4:46 PM   Specimen: Throat; Sterile Swab  Result Value Ref Range Status   Group A Strep by PCR NOT DETECTED NOT DETECTED Final    Comment: Performed at [facility], [address] [place], [place] [address]  Culture, blood (Routine X 2) w Reflex to ID Panel     Status: None (Preliminary result)   Collection Time: 03/05/21  5:46 PM   Specimen: BLOOD  Result Value  Ref Range Status   Specimen Description BLOOD LEFT ANTECUBITAL  Final   Special Requests   Final    BOTTLES DRAWN AEROBIC AND ANAEROBIC Blood Culture adequate volume   Culture  Setup Time   Final    GRAM POSITIVE COCCI IN BOTH AEROBIC AND ANAEROBIC BOTTLES CRITICAL VALUE NOTED.  VALUE IS CONSISTENT WITH PREVIOUSLY REPORTED AND CALLED VALUE. Performed at [facility], [address] [place], [place] [address]    Culture [facility] POSITIVE COCCI  Final   Report Status PENDING  Incomplete  Culture, blood (Routine X 2) w Reflex to ID Panel     Status: None (Preliminary result)   Collection Time: 03/05/21  5:50 PM   Specimen: BLOOD  Result Value Ref Range Status   Specimen Description BLOOD RIGHT ANTECUBITAL  Final   Special Requests   Final    BOTTLES DRAWN AEROBIC AND ANAEROBIC Blood Culture adequate volume   Culture  Setup Time   Final    Organism ID to follow GRAM POSITIVE COCCI IN BOTH AEROBIC AND ANAEROBIC BOTTLES CRITICAL RESULT CALLED TO, READ BACK  BY AND VERIFIED WITH: Algie Coffer AT 1146 03/06/21 SDR Performed at [facility], [address] [place], [place] [address]    Culture [facility] POSITIVE COCCI  Final   Report Status PENDING  Incomplete  Blood Culture ID Panel (Reflexed)     Status: Abnormal   Collection Time: 03/05/21  5:50 PM  Result Value Ref Range Status   Enterococcus faecalis DETECTED (A) NOT DETECTED Final    Comment: CRITICAL RESULT CALLED TO, READ BACK BY AND VERIFIED WITH:  SUSAN WATSON AT 1146 03/06/21 SDR    Enterococcus Faecium NOT DETECTED NOT DETECTED Final   Listeria monocytogenes NOT DETECTED NOT DETECTED Final   Staphylococcus species NOT DETECTED NOT DETECTED Final   Staphylococcus aureus (BCID) NOT DETECTED NOT DETECTED Final   Staphylococcus epidermidis NOT DETECTED NOT DETECTED Final   Staphylococcus lugdunensis NOT DETECTED NOT DETECTED Final   Streptococcus species NOT DETECTED NOT DETECTED Final   Streptococcus agalactiae NOT  DETECTED NOT DETECTED Final   Streptococcus pneumoniae NOT DETECTED NOT DETECTED Final   Streptococcus pyogenes NOT DETECTED NOT DETECTED Final   A.calcoaceticus-baumannii NOT DETECTED NOT DETECTED Final   Bacteroides fragilis NOT DETECTED NOT DETECTED Final   Enterobacterales NOT DETECTED NOT DETECTED Final   Enterobacter cloacae complex NOT DETECTED NOT DETECTED Final   Escherichia coli NOT DETECTED NOT DETECTED Final   Klebsiella aerogenes NOT DETECTED NOT DETECTED Final   Klebsiella oxytoca NOT DETECTED NOT DETECTED Final   Klebsiella pneumoniae NOT DETECTED NOT DETECTED Final   Proteus species NOT DETECTED NOT DETECTED Final   Salmonella species NOT DETECTED NOT DETECTED Final   Serratia marcescens NOT DETECTED NOT DETECTED Final   Haemophilus influenzae NOT DETECTED NOT DETECTED Final   Neisseria meningitidis NOT DETECTED NOT DETECTED Final   Pseudomonas aeruginosa NOT DETECTED NOT DETECTED Final   Stenotrophomonas maltophilia NOT DETECTED NOT DETECTED Final   Candida albicans NOT DETECTED NOT DETECTED Final   Candida auris NOT DETECTED NOT DETECTED Final   Candida glabrata NOT DETECTED NOT DETECTED Final   Candida krusei NOT DETECTED NOT DETECTED Final   Candida parapsilosis NOT DETECTED NOT DETECTED Final   Candida tropicalis NOT DETECTED NOT DETECTED Final   Cryptococcus neoformans/gattii NOT DETECTED NOT DETECTED Final   Vancomycin resistance NOT DETECTED NOT DETECTED Final    Comment: Performed at [facility], [address] [place], [place] [address]    Studies/Results: No results found.  Assessment/Plan: L Tuckerman is a 30 y.o. 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  Dorna Bloom so far negative for etiology of glomerulonephritis. He is HIV neg.  He did have prodromal sxs of fevers and chills for several weeks and I am concerned for  SBE with associated glomerulonephritis. Strongly denies any IVDU so if has SBE possible oral source.  He did not have bcx done on admit and received IV abx in ED.  03/06/21- bcx + 2/2 enterococcus. TEE with flail leaflet He tells me he has issues with chronic nausea, diarrhea, wt loss. Has had blood in his stool in past. 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 Being referred for CT surg eval Hold on picc line until cxs cleared. Repeat BCX tomorrow to document clearance Check CT abd pelvis without contrast. Would need GI fu otpt for  colonoscopy.  Thank you very much for the consult. Will follow with you.  Mick Sell   03/06/2021, 2:57 PM

## 2021-03-06 NOTE — Progress Notes (Signed)
PHARMACY - PHYSICIAN COMMUNICATION CRITICAL VALUE ALERT - BLOOD CULTURE IDENTIFICATION (BCID)  Jerome Mccormick is an 30 y.o. male who presented to [facility] on 03/02/2021 with a chief complaint of headaches and AKI  Assessment: blood cultures from 03/05/2021 with GPC in anaerobic bottles of both sets, BCID E. Faecalis.  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  Denies IVDU.  TEE did not reveal vegetation but flail/ruptured posterior chordae of MV seen  Name of physician (or Provider) Contacted: Dr Sampson Goon  Current antibiotics: none  Changes to prescribed antibiotics recommended:  Recommendations accepted by provider - start ampicillin  Results for orders placed or performed during the hospital encounter of 03/02/21  Blood Culture ID Panel (Reflexed) (Collected: 03/05/2021  5:50 PM)  Result Value Ref Range   Enterococcus faecalis DETECTED (A) NOT DETECTED   Enterococcus Faecium NOT DETECTED NOT DETECTED   Listeria monocytogenes NOT DETECTED NOT DETECTED   Staphylococcus species NOT DETECTED NOT DETECTED   Staphylococcus aureus (BCID) NOT DETECTED NOT DETECTED   Staphylococcus epidermidis NOT DETECTED NOT DETECTED   Staphylococcus lugdunensis NOT DETECTED NOT DETECTED   Streptococcus species NOT DETECTED NOT DETECTED   Streptococcus agalactiae NOT DETECTED NOT DETECTED   Streptococcus pneumoniae NOT DETECTED NOT DETECTED   Streptococcus pyogenes NOT DETECTED NOT DETECTED   A.calcoaceticus-baumannii NOT DETECTED NOT DETECTED   Bacteroides fragilis NOT DETECTED NOT DETECTED   Enterobacterales NOT DETECTED NOT DETECTED   Enterobacter cloacae complex NOT DETECTED NOT DETECTED   Escherichia coli NOT DETECTED NOT DETECTED   Klebsiella aerogenes NOT DETECTED NOT DETECTED   Klebsiella oxytoca NOT DETECTED NOT DETECTED   Klebsiella pneumoniae NOT DETECTED NOT DETECTED   Proteus species NOT DETECTED NOT DETECTED   Salmonella species NOT DETECTED NOT DETECTED   Serratia  marcescens NOT DETECTED NOT DETECTED   Haemophilus influenzae NOT DETECTED NOT DETECTED   Neisseria meningitidis NOT DETECTED NOT DETECTED   Pseudomonas aeruginosa NOT DETECTED NOT DETECTED   Stenotrophomonas maltophilia NOT DETECTED NOT DETECTED   Candida albicans NOT DETECTED NOT DETECTED   Candida auris NOT DETECTED NOT DETECTED   Candida glabrata NOT DETECTED NOT DETECTED   Candida krusei NOT DETECTED NOT DETECTED   Candida parapsilosis NOT DETECTED NOT DETECTED   Candida tropicalis NOT DETECTED NOT DETECTED   Cryptococcus neoformans/gattii NOT DETECTED NOT DETECTED   Vancomycin resistance NOT DETECTED NOT DETECTED    Juliette Alcide, PharmD, BCPS.   Work Cell: [phone] 03/06/2021 12:17 PM

## 2021-03-06 NOTE — Procedures (Addendum)
Transesophageal Echocardiogram :  Indication: mitral regurgitation  Procedure: 27ml of viscous lidocaine were given orally to provide local anesthesia to the oropharynx. The patient was positioned supine on the left side, bite block provided. The patient was moderately sedated with the doses of versed and fentanyl as detailed below.  Using digital technique an omniplane probe was advanced into the esophagus without incident.   Moderate sedation: 1. Sedation used:  Versed: 3mg , Fentanyl: 2. Time administered:  9:40   Time when patient started recovery: 10:10 3. I was face to face during this time  See report in EPIC  for complete details: In brief, imaging revealed Moderate mitral regurgitaiton, flail/ruptured posterior chordae of the mitral valve apparatus.  Normal LV function with no RWMAs and no mural apical thrombus.  .  Estimated ejection fraction was 60%.  Right sided cardiac chambers were normal with no evidence of pulmonary hypertension.  Imaging of the septum showed no ASD or VSD 2D and color flow confirmed no PFO  The LA was well visualized in orthogonal views.  There was no spontaneous contrast and no thrombus in the LA and LA appendage   The descending thoracic aorta had no  mural aortic debris with no evidence of aneurysmal dilation or dissection  Recommendations  CT surgery consult for evaluation regarding repair/replacement of mitral valve apparatus.    Agbor-Etang 03/06/2021 10:22 AM

## 2021-03-06 NOTE — Plan of Care (Signed)
Pt Axox4. Calm and cooperative and able to voice his needs. Prn Tylenol administered yesterday evening for c/o of L flank pain, effective. NPO since MN. Safety measures in place. Will continue to monitor.  Problem: Education: Goal: Knowledge of General Education information will improve Description: Including pain rating scale, medication(s)/side effects and non-pharmacologic comfort measures Outcome: Progressing   Problem: Health Behavior/Discharge Planning: Goal: Ability to manage health-related needs will improve Outcome: Progressing   Problem: Clinical Measurements: Goal: Ability to maintain clinical measurements within normal limits will improve Outcome: Progressing Goal: Will remain free from infection Outcome: Progressing Goal: Diagnostic test results will improve Outcome: Progressing Goal: Respiratory complications will improve Outcome: Progressing Goal: Cardiovascular complication will be avoided Outcome: Progressing   Problem: Activity: Goal: Risk for activity intolerance will decrease Outcome: Progressing   Problem: Nutrition: Goal: Adequate nutrition will be maintained Outcome: Progressing   Problem: Coping: Goal: Level of anxiety will decrease Outcome: Progressing   Problem: Elimination: Goal: Will not experience complications related to bowel motility Outcome: Progressing Goal: Will not experience complications related to urinary retention Outcome: Progressing   Problem: Pain Managment: Goal: General experience of comfort will improve Outcome: Progressing   Problem: Safety: Goal: Ability to remain free from injury will improve Outcome: Progressing   Problem: Skin Integrity: Goal: Risk for impaired skin integrity will decrease Outcome: Progressing

## 2021-03-06 NOTE — Progress Notes (Signed)
Pharmacy Antibiotic Note  Jerome Mccormick is a 30 y.o. male admitted on 03/02/2021 found to have bacteremia. Patient was initially admitted for headache and congestion x 7 days, but was found to have Scr 4.1 (BL~0.9 in 2015) and BUN 75. Patient does not have a history of renal disease. 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 TEE revealed flail/ruptured posterior chordae of mitral valve apparatus, but no vegetations. 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 Blood cultures on 03/05/2021 from both sets of anaerobic bottles are positive for enterococcus faecalis. Pharmacy has been consulted for ampicillin dosing.  Plan: Start ampicillin 2 g IV every 8 hours Monitor Scr & CBC daily F/u culture sensitivities  Repeat blood cultures in 48 hours  Height: 5\' 7"  (170.2 cm) Weight: 59.9 kg (132 lb) IBW/kg (Calculated) : 66.1  Temp (24hrs), Avg:97.9 F (36.6 C), Min:97.4 F (36.3 C), Max:98.3 F (36.8 C)  Recent Labs  Lab 03/02/21 1631 03/03/21 0552 03/04/21 0633 03/05/21 0454 03/06/21 0444  WBC 7.6 9.5 7.0  --  6.3  CREATININE 4.31* 4.10* 4.35* 4.22* 5.00*    Estimated Creatinine Clearance: 18.3 mL/min (A) (by C-G formula based on SCr of 5 mg/dL (H)).    No Known Allergies  Antimicrobials this admission: Ceftriaxone 4/2 x1  Ampicillin 4/6>>  Microbiology results: 4/5 BCx: both sets of anaerobic bottles positive for enterococcus faecalis, no vancomycin resistance detected  Thank you for allowing pharmacy to be a part of this patient's care.  6/5 PharmD Candidate, Class of 2022 [employer]

## 2021-03-06 NOTE — Progress Notes (Signed)
Progress Note  Patient Name: Jerome Mccormick Date of Encounter: 03/06/2021  [facility] Cardiologist: Dr. Kirke Corin  Subjective   Sob and edema is much improved. He is a little nervous regarding TEE procedure today. Otherwise feels ok  Inpatient Medications    Scheduled Meds: . butamben-tetracaine-benzocaine      . butamben-tetracaine-benzocaine      . fentaNYL      . [MAR Hold] folic acid  1 mg Oral Daily  . [MAR Hold] methylPREDNISolone (SOLU-MEDROL) injection  60 mg Intravenous Daily  . midazolam      . [MAR Hold] multivitamin with minerals  1 tablet Oral Daily  . [MAR Hold] nicotine  14 mg Transdermal Daily  . sodium chloride flush      . [MAR Hold] thiamine  100 mg Oral Daily   Or  . [MAR Hold] thiamine  100 mg Intravenous Daily   Continuous Infusions: . sodium chloride    . [MAR Hold] methocarbamol (ROBAXIN) IV     PRN Meds: [MAR Hold] acetaminophen **OR** [MAR Hold] acetaminophen, fentaNYL, [MAR Hold] LORazepam **OR** [MAR Hold] LORazepam, LORazepam, [MAR Hold] methocarbamol (ROBAXIN) IV, midazolam, midazolam, [MAR Hold] ondansetron **OR** [MAR Hold] ondansetron (ZOFRAN) IV, [MAR Hold] polyethylene glycol, [MAR Hold] traZODone   Vital Signs    Vitals:   03/06/21 0951 03/06/21 0956 03/06/21 1001 03/06/21 1006  BP: 134/85 122/78 126/80   Pulse: 92 76 91 89  Resp: 20 20 (!) 22 (!) 23  Temp:      TempSrc:      SpO2: 100% 100% 100% 100%  Weight:      Height:        Intake/Output Summary (Last 24 hours) at 03/06/2021 1013 Last data filed at 03/06/2021 0547 Gross per 24 hour  Intake 600 ml  Output --  Net 600 ml   Last 3 Weights 03/06/2021 03/02/2021 12/15/2017  Weight (lbs) 132 lb 138 lb 145 lb  Weight (kg) 59.875 kg 62.596 kg 65.772 kg      Telemetry    Sinus rhythm - Personally Reviewed  ECG    Sinus rhythm - Personally Reviewed  Physical Exam   GEN: No acute distress.   Neck: No JVD Cardiac: RRR, 2/6 systolic murmur loudest at apex Respiratory:  Clear to auscultation bilaterally. GI: Soft, nontender, non-distended  MS: No edema; No deformity. Neuro:  Nonfocal  Psych: Normal affect   Labs    High Sensitivity Troponin:  No results for input(s): TROPONINIHS in the last 720 hours.    Chemistry Recent Labs  Lab 03/02/21 1631 03/03/21 0552 03/04/21 0633 03/05/21 0454 03/06/21 0444  NA 130*   < > 133* 132* 133*  K 4.3   < > 4.2 4.8 5.9*  CL 98   < > 103 103 102  CO2 24   < > 21* 23 23  GLUCOSE 97   < > 106* 108* 143*  BUN 75*   < > 73* 72* 79*  CREATININE 4.31*   < > 4.35* 4.22* 5.00*  CALCIUM 7.2*   < > 7.4* 7.7* 8.4*  PROT 6.5  --   --   --   --   ALBUMIN 2.7*  --  2.3* 2.3* 2.5*  AST 15  --   --   --   --   ALT 12  --   --   --   --   ALKPHOS 46  --   --   --   --   BILITOT 0.4  --   --   --   --  GFRNONAA 18*   < > 18* 18* 15*  ANIONGAP 8   < > 9 6 8    < > = values in this interval not displayed.     Hematology Recent Labs  Lab 03/03/21 0552 03/04/21 0633 03/06/21 0444  WBC 9.5 7.0 6.3  RBC 3.29* 3.20* 3.62*  HGB 9.2* 9.0* 9.9*  HCT 28.6* 28.0* 31.1*  MCV 86.9 87.5 85.9  MCH 28.0 28.1 27.3  MCHC 32.2 32.1 31.8  RDW 13.2 13.2 13.1  PLT 319 295 287    BNPNo results for input(s): BNP, PROBNP in the last 168 hours.   DDimer No results for input(s): DDIMER in the last 168 hours.   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 ANESTHESIA/SEDATION: Moderate (conscious) sedation was employed during this procedure. A total of Versed 2.0 mg and Fentanyl 100 mcg was administered intravenously. Moderate Sedation Time: 10 minutes. The patient's level of consciousness and vital signs were monitored continuously by radiology nursing throughout the procedure under my direct supervision. FLUOROSCOPY TIME:  Ultrasound COMPLICATIONS: None PROCEDURE: Informed written  consent was obtained from the patient after a thorough discussion of the procedural risks, benefits and alternatives. All questions were addressed. Maximal Sterile Barrier Technique was utilized including caps, mask, sterile gowns, sterile gloves, sterile drape, hand hygiene and skin antiseptic. A timeout was performed prior to the initiation of the procedure Patient was positioned prone position on the gantry table. Images were stored sent to PACs. Once the patient is prepped and draped in the usual sterile fashion, the skin and subcutaneous tissues overlying the left kidney were generously infiltrated 1% lidocaine for local anesthesia. Using ultrasound guidance, a 15 gauge guide needle was advanced into the lower cortex of the left kidney. Once we confirmed location of the needle tip, 2 separate 16 gauge core biopsy were achieved. Two Gel-Foam pledgets were infused with a small amount of saline. The needle was removed. Final images were stored. The patient tolerated the procedure well and remained hemodynamically stable throughout. No complications were encountered and no significant blood loss encountered. IMPRESSION: Status post ultrasound-guided biopsy of left kidney for medical renal purpose. Signed, 26. Yvone Neu, RPVI [facility] Electronically Signed   By: [facility] D.O.   On: 03/04/2021 12:11    Cardiac Studies   TTE 03/03/2021 1. Left ventricular ejection fraction, by estimation, is 60 to 65%. The  left ventricle has normal function. The left ventricle has no regional  wall motion abnormalities. Left ventricular diastolic parameters were  normal.  2. Right ventricular systolic function is normal. The right ventricular  size is normal. There is mildly elevated pulmonary artery systolic  pressure. The estimated right ventricular systolic pressure is 38.3 mmHg.  3. Left atrial size was moderately dilated.  4. The mitral valve  is abnormal.Posterior leaflet with prolapse, unable  to exclude partially flail leaflet or torn chordea (images 30-32) Moderate  eccentric mitral valve regurgitation. No evidence of mitral stenosis.  5. The inferior vena cava is dilated in size with >50% respiratory  variability, suggesting right atrial pressure of 8 mmHg.   Patient Profile     30 y.o. male with hx of etoh use, tobacco use presenting with sob, edema and aki. Found to have moderate mitral regurgitation and possible flail posterior leaflet.  Assessment & Plan  1. Moderate Mitral regurgitation -possible ruptured chordae -plan for TEE to evaluate mitral valve pathology  2. Acute kidney injury -mgmt as per renal -avoid nephrotoxic agents   Total encounter time 35 minutes  Greater than 50% was spent in counseling and coordination of care with the patient     Signed, Debbe Odea, MD  03/06/2021, 10:13 AM

## 2021-03-06 NOTE — Progress Notes (Signed)
PROGRESS NOTE    Jerome Mccormick  ZOX:[id number] DOB: [birth year] DOA: 03/02/2021 PCP: Patient, No Pcp Per (Inactive)    Assessment & Plan:   Principal Problem:   AKI (acute kidney injury) (HCC) Active Problems:   Anemia   Cardiac murmur   Nonrheumatic mitral valve regurgitation   AKI: w/ hematuria & proteinuria. Baseline Cr is 0.9. Cr is 5.0, so trending up today. Etiology unclear. ANA, ribonucleic protein ab is positive. S/p kidney biopsy on 03/04/21, awaiting on pathology. Prelim path shows glomerulonephritis. Nephro following and recs apprec  Normocytic anemia: etiology unclear. No need for a transfusion currently  Heart murmur: echo shows normal EF, normal diastolic function & MV w/ possible prolapse & possible flail leaflet & associated moderate MV regurgitation. 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 CT surg consulted by cardio. Cardio following and recs apprec  Bacteremia: blood cxs growing enterococcus faecalis, send pending. Continue on IV amip  Hyperkalemia: likely secondary to worsening AKI. Management per nephro   Hyponatremia: etiology unclear. Will continue to monitor   Hx of alcohol abuse: continue on CIWA    DVT prophylaxis: SCDs Code Status: full  Family Communication:  Disposition Plan: likely d/c back home   Level of care: Med-Surg   Status is: Inpatient  Remains inpatient appropriate because:Ongoing diagnostic testing needed not appropriate for outpatient work up, IV treatments appropriate due to intensity of illness or inability to take PO and Inpatient level of care appropriate due to severity of illness   Dispo: The patient is from: Home              Anticipated d/c is to: Home              Patient currently is not medically stable to d/c.   Difficult to place patient Yes        Consultants:   Cardio  nephro  CT surg    Procedures:    Antimicrobials: ampicillin    Subjective: Pt c/o malaise    Objective: Vitals:   03/05/21 1638 03/05/21 2049 03/05/21 2258 03/06/21 0506  BP: (!) 144/85 124/79 126/82 114/64  Pulse: 77 73 64 (!) 57  Resp: 18 20 18 20   Temp: 98.3 F (36.8 C) 98.3 F (36.8 C) 98.1 F (36.7 C) (!) 97.4 F (36.3 C)  TempSrc: Oral Oral Oral Oral  SpO2: 100% 100% 100% 100%  Weight:      Height:        Intake/Output Summary (Last 24 hours) at 03/06/2021 0811 Last data filed at 03/06/2021 0547 Gross per 24 hour  Intake 720 ml  Output --  Net 720 ml   Filed Weights   03/02/21 1533  Weight: 62.6 kg    Examination:  General exam: Appears calm and comfortable  Respiratory system: Clear to auscultation. Respiratory effort normal. Cardiovascular system: S1 & S2 +. No rubs, gallops or clicks. Systolic murmur  Gastrointestinal system: Abdomen is nondistended, soft and nontender. Normal bowel sounds heard. Central nervous system: Alert and oriented. Moves all 4 extremities  Psychiatry: Judgement and insight appear normal. Flat mood and affect.     Data Reviewed: I have personally reviewed following labs and imaging studies  CBC: Recent Labs  Lab 03/02/21 1631 03/03/21 0552 03/04/21 0633 03/06/21 0444  WBC 7.6 9.5 7.0 6.3  NEUTROABS 5.7  --   --   --   HGB 8.8* 9.2* 9.0* 9.9*  HCT 27.5* 28.6* 28.0* 31.1*  MCV 88.1 86.9 87.5 85.9  PLT 309 319 295 287   Basic Metabolic Panel: Recent Labs  Lab 03/02/21 1631 03/03/21 0552 03/04/21 0633 03/05/21 0454 03/06/21 0444  NA 130* 133* 133* 132* 133*  K 4.3 4.4 4.2 4.8 5.9*  CL 98 103 103 103 102  CO2 24 21* 21* 23 23  GLUCOSE 97 115* 106* 108* 143*  BUN 75* 75* 73* 72* 79*  CREATININE 4.31* 4.10* 4.35* 4.22* 5.00*  CALCIUM 7.2* 7.1* 7.4* 7.7* 8.4*  PHOS  --   --  5.6* 5.7* 7.7*   GFR: Estimated Creatinine Clearance: 19.1 mL/min (A) (by C-G formula based on SCr of 5 mg/dL (H)). Liver Function Tests: Recent Labs  Lab 03/02/21 1631 03/04/21 5300 03/05/21 0454 03/06/21 0444  AST 15  --   --    --   ALT 12  --   --   --   ALKPHOS 46  --   --   --   BILITOT 0.4  --   --   --   PROT 6.5  --   --   --   ALBUMIN 2.7* 2.3* 2.3* 2.5*   No results for input(s): LIPASE, AMYLASE in the last 168 hours. No results for input(s): AMMONIA in the last 168 hours. Coagulation Profile: Recent Labs  Lab 03/04/21 0633  INR 1.2   Cardiac Enzymes: No results for input(s): CKTOTAL, CKMB, CKMBINDEX, TROPONINI in the last 168 hours. BNP (last 3 results) No results for input(s): PROBNP in the last 8760 hours. HbA1C: No results for input(s): HGBA1C in the last 72 hours. CBG: No results for input(s): GLUCAP in the last 168 hours. Lipid Profile: No results for input(s): CHOL, HDL, LDLCALC, TRIG, CHOLHDL, LDLDIRECT in the last 72 hours. Thyroid Function Tests: No results for input(s): TSH, T4TOTAL, FREET4, T3FREE, THYROIDAB in the last 72 hours. Anemia Panel: No results for input(s): VITAMINB12, FOLATE, FERRITIN, TIBC, IRON, RETICCTPCT in the last 72 hours. Sepsis Labs: No results for input(s): PROCALCITON, LATICACIDVEN in the last 168 hours.  Recent Results (from the past 240 hour(s))  Resp Panel by RT-PCR (Flu A&B, Covid) Nasopharyngeal Swab     Status: None   Collection Time: 03/02/21  4:31 PM   Specimen: Nasopharyngeal Swab; Nasopharyngeal(NP) swabs in vial transport medium  Result Value Ref Range Status   SARS Coronavirus 2 by RT PCR NEGATIVE NEGATIVE Final    Comment: (NOTE) SARS-CoV-2 target nucleic acids are NOT DETECTED.  The SARS-CoV-2 RNA is generally detectable in upper respiratory specimens during the acute phase of infection. The lowest concentration of SARS-CoV-2 viral copies this assay can detect is 138 copies/mL. A negative result does not preclude SARS-Cov-2 infection and should not be used as the sole basis for treatment or other patient management decisions. A negative result may occur with  improper specimen collection/handling, submission of specimen other than  nasopharyngeal swab, presence of viral mutation(s) within the areas targeted by this assay, and inadequate number of viral copies(<138 copies/mL). A negative result must be combined with clinical observations, patient history, and epidemiological information. The expected result is Negative.  Fact Sheet for Patients:  BloggerCourse.com  Fact Sheet for Healthcare Providers:  SeriousBroker.it  This test is no t yet approved or cleared by the [place] FDA and  has been authorized for detection and/or diagnosis of SARS-CoV-2 by FDA under an Emergency Use Authorization (EUA). This EUA will remain  in effect (meaning this test can be used) for the duration of the COVID-19 declaration under Section 564(b)(1) of the Act,  21 U.S.C.section 360bbb-3(b)(1), unless the authorization is terminated  or revoked sooner.       Influenza A by PCR NEGATIVE NEGATIVE Final   Influenza B by PCR NEGATIVE NEGATIVE Final    Comment: (NOTE) The Xpert Xpress SARS-CoV-2/FLU/RSV plus assay is intended as an aid in the diagnosis of influenza from Nasopharyngeal swab specimens and should not be used as a sole basis for treatment. Nasal washings and aspirates are unacceptable for Xpert Xpress SARS-CoV-2/FLU/RSV testing.  Fact Sheet for Patients: BloggerCourse.com  Fact Sheet for Healthcare Providers: SeriousBroker.it  This test is not yet approved or cleared by the [place] FDA and has been authorized for detection and/or diagnosis of SARS-CoV-2 by FDA under an Emergency Use Authorization (EUA). This EUA will remain in effect (meaning this test can be used) for the duration of the COVID-19 declaration under Section 564(b)(1) of the Act, 21 U.S.C. section 360bbb-3(b)(1), unless the authorization is terminated or revoked.  Performed at [facility], [address] [place], [place]  [address]   Group A Strep by PCR     Status: None   Collection Time: 03/05/21  4:46 PM   Specimen: Throat; Sterile Swab  Result Value Ref Range Status   Group A Strep by PCR NOT DETECTED NOT DETECTED Final    Comment: Performed at [facility], [address] [place], [place] [address]  Culture, blood (Routine X 2) w Reflex to ID Panel     Status: None (Preliminary result)   Collection Time: 03/05/21  5:46 PM   Specimen: BLOOD  Result Value Ref Range Status   Specimen Description BLOOD LEFT ANTECUBITAL  Final   Special Requests   Final    BOTTLES DRAWN AEROBIC AND ANAEROBIC Blood Culture adequate volume   Culture   Final    NO GROWTH < 12 HOURS Performed at [facility], [address] [place], [place] [address]    Report Status PENDING  Incomplete  Culture, blood (Routine X 2) w Reflex to ID Panel     Status: None (Preliminary result)   Collection Time: 03/05/21  5:50 PM   Specimen: BLOOD  Result Value Ref Range Status   Specimen Description BLOOD RIGHT ANTECUBITAL  Final   Special Requests   Final    BOTTLES DRAWN AEROBIC AND ANAEROBIC Blood Culture adequate volume   Culture   Final    NO GROWTH < 12 HOURS Performed at [facility], [address] [place], [place] [address]    Report Status PENDING  Incomplete         Radiology Studies: US BIOPSY (KIDNEY)  Result Date: 03/04/2021 INDICATION: 30 year old male with a history of acute renal failure and 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 ANESTHESIA/SEDATION: Moderate (conscious) sedation was employed during this procedure. A total of Versed 2.0 mg and Fentanyl 100 mcg was administered intravenously. Moderate Sedation Time: 10 minutes. The patient's level of consciousness and vital signs were monitored continuously by radiology nursing throughout the procedure under my direct supervision. FLUOROSCOPY TIME:  Ultrasound COMPLICATIONS: None PROCEDURE: Informed written  consent was obtained from the patient after a thorough discussion of the procedural risks, benefits and alternatives. All questions were addressed. Maximal Sterile Barrier Technique was utilized including caps, mask, sterile gowns, sterile gloves, sterile drape, hand hygiene and skin antiseptic. A timeout was performed prior to the initiation of the procedure Patient was positioned prone position on the gantry table. Images were stored sent to PACs. Once the patient  is prepped and draped in the usual sterile fashion, the skin and subcutaneous tissues overlying the left kidney were generously infiltrated 1% lidocaine for local anesthesia. Using ultrasound guidance, a 15 gauge guide needle was advanced into the lower cortex of the left kidney. Once we confirmed location of the needle tip, 2 separate 16 gauge core biopsy were achieved. Two Gel-Foam pledgets were infused with a small amount of saline. The needle was removed. Final images were stored. The patient tolerated the procedure well and remained hemodynamically stable throughout. No complications were encountered and no significant blood loss encountered. IMPRESSION: Status post ultrasound-guided biopsy of left kidney for medical renal purpose. Signed, Yvone Neu. Reyne Dumas, RPVI [facility] Radiology Electronically Signed   By: Gilmer Mor D.O.   On: 03/04/2021 12:11        Scheduled Meds: . folic acid  1 mg Oral Daily  . methylPREDNISolone (SOLU-MEDROL) injection  60 mg Intravenous Daily  . multivitamin with minerals  1 tablet Oral Daily  . nicotine  14 mg Transdermal Daily  . thiamine  100 mg Oral Daily   Or  . thiamine  100 mg Intravenous Daily   Continuous Infusions: . methocarbamol (ROBAXIN) IV       LOS: 4 days    Time spent: 33 mins     Charise Killian, MD [facility] Pager [phone]  If 7PM-7AM, please contact night-coverage 03/06/2021, 8:11 AM

## 2021-03-06 NOTE — Progress Notes (Signed)
*  PRELIMINARY RESULTS* Echocardiogram Echocardiogram Transesophageal has been performed.  Cristela Blue 03/06/2021, 10:28 AM

## 2021-03-06 NOTE — [place] Notes (Signed)
[place], [place] 03/06/21  Subjective:   LOS: 4  Jerome Mccormick is a 30 y.o. male with an extensive history of NSAID use and alcohol use. He presents to ED with 7 days of headaches and congestion. He is found to increased Creatinine 4.31.  Patient seen this afternoon after TEE procedure Alert and oriented Has throat soreness Tolerating fluids     Objective:  Vital signs in last 24 hours:  Temp:  [97.4 F (36.3 C)-98.3 F (36.8 C)] 97.6 F (36.4 C) (04/06 1129) Pulse Rate:  [57-113] 72 (04/06 1129) Resp:  [17-25] 20 (04/06 1129) BP: (114-144)/(64-85) 115/66 (04/06 1129) SpO2:  [99 %-100 %] 99 % (04/06 1045) Weight:  [59.9 kg] 59.9 kg (04/06 0859)  Weight change:  Filed Weights   03/02/21 1533 03/06/21 0859  Weight: 62.6 kg 59.9 kg    Intake/Output:    Intake/Output Summary (Last 24 hours) at 03/06/2021 1304 Last data filed at 03/06/2021 0547 Gross per 24 hour  Intake 600 ml  Output --  Net 600 ml     Physical Exam: General: NAD, resting in bed  HEENT Anicteric, moist oral mucosa  Pulm/lungs Clear bilaterally, normal breathing pattern  CVS/Heart S1S2 present, regular rate  Abdomen:  Soft, non tender  Extremities: No peripheral edema  Neurologic: Alert, oriented, able to answer questions  Skin: No rashes or masses          Basic Metabolic Panel:  Recent Labs  Lab 03/02/21 1631 03/03/21 0552 03/04/21 0633 03/05/21 0454 03/06/21 0444  NA 130* 133* 133* 132* 133*  K 4.3 4.4 4.2 4.8 5.9*  CL 98 103 103 103 102  CO2 24 21* 21* 23 23  GLUCOSE 97 115* 106* 108* 143*  BUN 75* 75* 73* 72* 79*  CREATININE 4.31* 4.10* 4.35* 4.22* 5.00*  CALCIUM 7.2* 7.1* 7.4* 7.7* 8.4*  PHOS  --   --  5.6* 5.7* 7.7*     CBC: Recent Labs  Lab 03/02/21 1631 03/03/21 0552 03/04/21 0633 03/06/21 0444  WBC 7.6 9.5 7.0 6.3  NEUTROABS 5.7  --   --   --   HGB 8.8* 9.2* 9.0* 9.9*  HCT 27.5* 28.6* 28.0* 31.1*  MCV 88.1 86.9 87.5 85.9  PLT  309 319 295 287      Lab Results  Component Value Date   HEPBSAG NON REACTIVE 03/02/2021   HEPBSAB NON REACTIVE 03/02/2021      Microbiology:  Recent Results (from the past 240 hour(s))  Resp Panel by RT-PCR (Flu A&B, Covid) Nasopharyngeal Swab     Status: None   Collection Time: 03/02/21  4:31 PM   Specimen: Nasopharyngeal Swab; Nasopharyngeal(NP) swabs in vial transport medium  Result Value Ref Range Status   SARS Coronavirus 2 by RT PCR NEGATIVE NEGATIVE Final    Comment: (NOTE) SARS-CoV-2 target nucleic acids are NOT DETECTED.  The SARS-CoV-2 RNA is generally detectable in upper respiratory specimens during the acute phase of infection. The lowest concentration of SARS-CoV-2 viral copies this assay can detect is 138 copies/mL. A negative result does not preclude SARS-Cov-2 infection and should not be used as the sole basis for treatment or other patient management decisions. A negative result may occur with  improper specimen collection/handling, submission of specimen other than nasopharyngeal swab, presence of viral mutation(s) within the areas targeted by this assay, and inadequate number of viral copies(<138 copies/mL). A negative result must be combined with clinical observations, patient history, and epidemiological information. The expected result is  Negative.  Fact Sheet for Patients:  EntrepreneurPulse.com.au  Fact Sheet for Healthcare Providers:  IncredibleEmployment.be  This test is no t yet approved or cleared by the [place] FDA and  has been authorized for detection and/or diagnosis of SARS-CoV-2 by FDA under an Emergency Use Authorization (EUA). This EUA will remain  in effect (meaning this test can be used) for the duration of the COVID-19 declaration under Section 564(b)(1) of the Act, 21 U.S.C.section 360bbb-3(b)(1), unless the authorization is terminated  or revoked sooner.       Influenza A by PCR  NEGATIVE NEGATIVE Final   Influenza B by PCR NEGATIVE NEGATIVE Final    Comment: (NOTE) The Xpert Xpress SARS-CoV-2/FLU/RSV plus assay is intended as an aid in the diagnosis of influenza from Nasopharyngeal swab specimens and should not be used as a sole basis for treatment. Nasal washings and aspirates are unacceptable for Xpert Xpress SARS-CoV-2/FLU/RSV testing.  Fact Sheet for Patients: EntrepreneurPulse.com.au  Fact Sheet for Healthcare Providers: IncredibleEmployment.be  This test is not yet approved or cleared by the [place] FDA and has been authorized for detection and/or diagnosis of SARS-CoV-2 by FDA under an Emergency Use Authorization (EUA). This EUA will remain in effect (meaning this test can be used) for the duration of the COVID-19 declaration under Section 564(b)(1) of the Act, 21 U.S.C. section 360bbb-3(b)(1), unless the authorization is terminated or revoked.  Performed at [facility], [address] [place], [place] [address]   Group A Strep by PCR     Status: None   Collection Time: 03/05/21  4:46 PM   Specimen: Throat; Sterile Swab  Result Value Ref Range Status   Group A Strep by PCR NOT DETECTED NOT DETECTED Final    Comment: Performed at [facility], [address] [place], [place] [address]  Culture, blood (Routine X 2) w Reflex to ID Panel     Status: None (Preliminary result)   Collection Time: 03/05/21  5:46 PM   Specimen: BLOOD  Result Value Ref Range Status   Specimen Description BLOOD LEFT ANTECUBITAL  Final   Special Requests   Final    BOTTLES DRAWN AEROBIC AND ANAEROBIC Blood Culture adequate volume   Culture  Setup Time   Final    GRAM POSITIVE COCCI ANAEROBIC BOTTLE ONLY CRITICAL VALUE NOTED.  VALUE IS CONSISTENT WITH PREVIOUSLY REPORTED AND CALLED VALUE. Performed at [facility], [address] [place], [place] [address]    Culture [facility] POSITIVE COCCI  Final    Report Status PENDING  Incomplete  Culture, blood (Routine X 2) w Reflex to ID Panel     Status: None (Preliminary result)   Collection Time: 03/05/21  5:50 PM   Specimen: BLOOD  Result Value Ref Range Status   Specimen Description BLOOD RIGHT ANTECUBITAL  Final   Special Requests   Final    BOTTLES DRAWN AEROBIC AND ANAEROBIC Blood Culture adequate volume   Culture  Setup Time   Final    Organism ID to follow GRAM POSITIVE COCCI IN BOTH AEROBIC AND ANAEROBIC BOTTLES CRITICAL RESULT CALLED TO, READ BACK BY AND VERIFIED WITH: Laqueta Carina AT 8527 03/06/21 [place] Performed at [facility], [address] [place],  [address]    Culture [facility] POSITIVE COCCI  Final   Report Status PENDING  Incomplete  Blood Culture ID Panel (Reflexed)     Status: Abnormal   Collection Time: 03/05/21  5:50 PM  Result Value Ref Range Status  Enterococcus faecalis DETECTED (A) NOT DETECTED Final    Comment: CRITICAL RESULT CALLED TO, READ BACK BY AND VERIFIED WITH:  SUSAN WATSON AT 1146 03/06/21 SDR    Enterococcus Faecium NOT DETECTED NOT DETECTED Final   Listeria monocytogenes NOT DETECTED NOT DETECTED Final   Staphylococcus species NOT DETECTED NOT DETECTED Final   Staphylococcus aureus (BCID) NOT DETECTED NOT DETECTED Final   Staphylococcus epidermidis NOT DETECTED NOT DETECTED Final   Staphylococcus lugdunensis NOT DETECTED NOT DETECTED Final   Streptococcus species NOT DETECTED NOT DETECTED Final   Streptococcus agalactiae NOT DETECTED NOT DETECTED Final   Streptococcus pneumoniae NOT DETECTED NOT DETECTED Final   Streptococcus pyogenes NOT DETECTED NOT DETECTED Final   A.calcoaceticus-baumannii NOT DETECTED NOT DETECTED Final   Bacteroides fragilis NOT DETECTED NOT DETECTED Final   Enterobacterales NOT DETECTED NOT DETECTED Final   Enterobacter cloacae complex NOT DETECTED NOT DETECTED Final   Escherichia coli NOT DETECTED NOT DETECTED Final   Klebsiella aerogenes NOT DETECTED NOT  DETECTED Final   Klebsiella oxytoca NOT DETECTED NOT DETECTED Final   Klebsiella pneumoniae NOT DETECTED NOT DETECTED Final   Proteus species NOT DETECTED NOT DETECTED Final   Salmonella species NOT DETECTED NOT DETECTED Final   Serratia marcescens NOT DETECTED NOT DETECTED Final   Haemophilus influenzae NOT DETECTED NOT DETECTED Final   Neisseria meningitidis NOT DETECTED NOT DETECTED Final   Pseudomonas aeruginosa NOT DETECTED NOT DETECTED Final   Stenotrophomonas maltophilia NOT DETECTED NOT DETECTED Final   Candida albicans NOT DETECTED NOT DETECTED Final   Candida auris NOT DETECTED NOT DETECTED Final   Candida glabrata NOT DETECTED NOT DETECTED Final   Candida krusei NOT DETECTED NOT DETECTED Final   Candida parapsilosis NOT DETECTED NOT DETECTED Final   Candida tropicalis NOT DETECTED NOT DETECTED Final   Cryptococcus neoformans/gattii NOT DETECTED NOT DETECTED Final   Vancomycin resistance NOT DETECTED NOT DETECTED Final    Comment: Performed at [facility], [address] [place], [place] [address]    Coagulation Studies: Recent Labs    03/04/21 0633  LABPROT 14.8  INR 1.2    Urinalysis: No results for input(s): COLORURINE, LABSPEC, PHURINE, GLUCOSEU, HGBUR, BILIRUBINUR, KETONESUR, PROTEINUR, UROBILINOGEN, NITRITE, LEUKOCYTESUR in the last 72 hours.  Invalid input(s): APPERANCEUR    Imaging: No results found.   Medications:   . ampicillin (OMNIPEN) IV    . methocarbamol (ROBAXIN) IV     . butamben-tetracaine-benzocaine      . butamben-tetracaine-benzocaine      . fentaNYL      . folic acid  1 mg Oral Daily  . methylPREDNISolone (SOLU-MEDROL) injection  60 mg Intravenous Daily  . midazolam      . multivitamin with minerals  1 tablet Oral Daily  . nicotine  14 mg Transdermal Daily  . sodium chloride flush      . thiamine  100 mg Oral Daily   Or  . thiamine  100 mg Intravenous Daily   acetaminophen **OR** acetaminophen, methocarbamol  (ROBAXIN) IV, ondansetron **OR** ondansetron (ZOFRAN) IV, polyethylene glycol, traZODone  Assessment/ Plan:  30 y.o. male with  was admitted on 03/02/2021 for  Principal Problem:   AKI (acute kidney injury) ([place]) Active Problems:   Anemia   Cardiac murmur   Nonrheumatic mitral valve regurgitation  AKI (acute kidney injury) ([place]) [N17.9] Acute kidney injury ([place]) [N17.9]  #. Acute kidney injury. 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 - ID consulted. Appreciate recs - no acute need for dialysis at this time  #. Anemia, iron defiency   Lab Results  Component Value Date   HGB 9.9 (L) 03/06/2021   Will continue to monitor  #. Hematuria - reports clear yellow urine - avoid heparin and aspirin - Renal biopsy completed 03/04/21  # Hyperkalemia - current level 5.8 - Lokelma 74m once   LOS: 4[place]4/6/20221:04 PM  [facility]

## 2021-03-07 ENCOUNTER — Telehealth: Payer: Self-pay | Admitting: *Deleted

## 2021-03-07 DIAGNOSIS — N170 Acute kidney failure with tubular necrosis: Principal | ICD-10-CM

## 2021-03-07 DIAGNOSIS — I340 Nonrheumatic mitral (valve) insufficiency: Secondary | ICD-10-CM

## 2021-03-07 DIAGNOSIS — I330 Acute and subacute infective endocarditis: Secondary | ICD-10-CM

## 2021-03-07 LAB — RENAL FUNCTION PANEL
Albumin: 2.3 g/dL — ABNORMAL LOW (ref 3.5–5.0)
Anion gap: 9 (ref 5–15)
BUN: 80 mg/dL — ABNORMAL HIGH (ref 6–20)
CO2: 22 mmol/L (ref 22–32)
Calcium: 7.8 mg/dL — ABNORMAL LOW (ref 8.9–10.3)
Chloride: 101 mmol/L (ref 98–111)
Creatinine, Ser: 4.18 mg/dL — ABNORMAL HIGH (ref 0.61–1.24)
GFR, Estimated: 19 mL/min — ABNORMAL LOW (ref >60)
Glucose, Bld: 111 mg/dL — ABNORMAL HIGH (ref 70–99)
Phosphorus: 6.4 mg/dL — ABNORMAL HIGH (ref 2.5–4.6)
Potassium: 4.7 mmol/L (ref 3.5–5.1)
Sodium: 132 mmol/L — ABNORMAL LOW (ref 135–145)

## 2021-03-07 LAB — CBC
HCT: 25.8 % — ABNORMAL LOW (ref 39.0–52.0)
Hemoglobin: 8.3 g/dL — ABNORMAL LOW (ref 13.0–17.0)
MCH: 27.8 pg (ref 26.0–34.0)
MCHC: 32.2 g/dL (ref 30.0–36.0)
MCV: 86.3 fL (ref 80.0–100.0)
Platelets: 292 10*3/uL (ref 150–400)
RBC: 2.99 MIL/uL — ABNORMAL LOW (ref 4.22–5.81)
RDW: 13.4 % (ref 11.5–15.5)
WBC: 7.7 10*3/uL (ref 4.0–10.5)
nRBC: 0 % (ref 0.0–0.2)

## 2021-03-07 LAB — ANCA TITERS
Atypical P-ANCA titer: <1:20 {titer}
C-ANCA: <1:20 {titer}
P-ANCA: <1:20 {titer}

## 2021-03-07 LAB — MAGNESIUM: Magnesium: 3.1 mg/dL — ABNORMAL HIGH (ref 1.7–2.4)

## 2021-03-07 MED ORDER — SODIUM CHLORIDE 0.9 % IV SOLN
INTRAVENOUS | Status: DC | PRN
  Administered 2021-03-08 – 2021-03-09 (×2): 250 mL via INTRAVENOUS

## 2021-03-07 NOTE — Progress Notes (Signed)
Progress Note  Patient Name: Jerome Mccormick Date of Encounter: 03/07/2021  [facility] Cardiologist: [facility]  Subjective   No complaints, reports he is feeling better No fevers documented Renal function slowly improving, stable  mother at the bedside Reports his general malaise is improving, denies shortness of breath  Inpatient Medications    Scheduled Meds: . folic acid  1 mg Oral Daily  . multivitamin with minerals  1 tablet Oral Daily  . nicotine  14 mg Transdermal Daily  . thiamine  100 mg Oral Daily   Or  . thiamine  100 mg Intravenous Daily   Continuous Infusions: . sodium chloride    . ampicillin (OMNIPEN) IV 2 g (03/07/21 1526)  . cefTRIAXone (ROCEPHIN)  IV 2 g (03/07/21 0957)  . methocarbamol (ROBAXIN) IV     PRN Meds: sodium chloride, acetaminophen **OR** acetaminophen, methocarbamol (ROBAXIN) IV, ondansetron **OR** ondansetron (ZOFRAN) IV, polyethylene glycol, traZODone   Vital Signs    Vitals:   03/06/21 2320 03/07/21 0446 03/07/21 0819 03/07/21 1140  BP: 130/85 114/71 118/69 120/75  Pulse: 79 85 68 77  Resp: 20 20 18 18   Temp: 97.9 F (36.6 C) 98.4 F (36.9 C) 98.1 F (36.7 C) 97.9 F (36.6 C)  TempSrc: Oral Oral Oral Oral  SpO2: 100% 99% 100% 100%  Weight:      Height:        Intake/Output Summary (Last 24 hours) at 03/07/2021 1538 Last data filed at 03/07/2021 1346 Gross per 24 hour  Intake 1238 ml  Output 200 ml  Net 1038 ml   Last 3 Weights 03/06/2021 03/02/2021 12/15/2017  Weight (lbs) 132 lb 138 lb 145 lb  Weight (kg) 59.875 kg 62.596 kg 65.772 kg      Telemetry    Normal sinus rhythm- Personally Reviewed  ECG     - Personally Reviewed  Physical Exam   GEN: No acute distress.   Neck: No JVD Cardiac: RRR, 3/6 systolic ejection murmur appreciated left sternal border No rubs, or gallops.  Respiratory: Clear to auscultation bilaterally. GI: Soft, nontender, non-distended  MS: No edema; No deformity. Neuro:  Nonfocal   Psych: Normal affect   Labs    High Sensitivity Troponin:  No results for input(s): TROPONINIHS in the last 720 hours.    Chemistry Recent Labs  Lab 03/02/21 1631 03/03/21 0552 03/05/21 0454 03/06/21 0444 03/07/21 0631  NA 130*   < > 132* 133* 132*  K 4.3   < > 4.8 5.9* 4.7  CL 98   < > 103 102 101  CO2 24   < > 23 23 22   GLUCOSE 97   < > 108* 143* 111*  BUN 75*   < > 72* 79* 80*  CREATININE 4.31*   < > 4.22* 5.00* 4.18*  CALCIUM 7.2*   < > 7.7* 8.4* 7.8*  PROT 6.5  --   --   --   --   ALBUMIN 2.7*   < > 2.3* 2.5* 2.3*  AST 15  --   --   --   --   ALT 12  --   --   --   --   ALKPHOS 46  --   --   --   --   BILITOT 0.4  --   --   --   --   GFRNONAA 18*   < > 18* 15* 19*  ANIONGAP 8   < > 6 8 9    < > =  values in this interval not displayed.     Hematology Recent Labs  Lab 03/04/21 7408 03/06/21 0444 03/07/21 0631  WBC 7.0 6.3 7.7  RBC 3.20* 3.62* 2.99*  HGB 9.0* 9.9* 8.3*  HCT 28.0* 31.1* 25.8*  MCV 87.5 85.9 86.3  MCH 28.1 27.3 27.8  MCHC 32.1 31.8 32.2  RDW 13.2 13.1 13.4  PLT 295 287 292    BNPNo results for input(s): BNP, PROBNP in the last 168 hours.   DDimer No results for input(s): DDIMER in the last 168 hours.   Radiology    CT ABDOMEN PELVIS WO CONTRAST  Result Date: 03/06/2021 CLINICAL DATA:  Chronic diarrhea and weight loss. Left-sided abdominal pain for 1 week. Poor kidney function. EXAM: CT ABDOMEN AND PELVIS WITHOUT CONTRAST TECHNIQUE: Multidetector CT imaging of the abdomen and pelvis was performed following the standard protocol without IV contrast. COMPARISON:  03/02/2021 FINDINGS: Lower chest: The lung bases are clear. The heart size is normal. The intracardiac blood pool is hypodense relative to the adjacent myocardium consistent with anemia. Hepatobiliary: The liver is normal. Normal gallbladder.There is no biliary ductal dilation. Pancreas: Normal contours without ductal dilatation. No peripancreatic fluid collection. Spleen: There is a  stable hypodense area in the splenic parenchyma towards the hilum. Adrenals/Urinary Tract: --Adrenal glands: Unremarkable. --Right kidney/ureter: No hydronephrosis or radiopaque kidney stones. --Left kidney/ureter: In the left perinephric space there is a new air and fluid collection abutting the lower pole of the left kidney. This collection measures approximately 5.9 cm across. This is likely a small perinephric hematoma in the setting of recent renal biopsy. --Urinary bladder: There is diffuse bladder wall thickening. Stomach/Bowel: --Stomach/Duodenum: No hiatal hernia or other gastric abnormality. Normal duodenal course and caliber. --Small bowel: There is mild wall thickening of several loops of small bowel in the mid left abdomen. --Colon: There appears to be mild wall thickening of the descending colon. --Appendix: Normal. Vascular/Lymphatic: Normal course and caliber of the major abdominal vessels. --No retroperitoneal lymphadenopathy. --No mesenteric lymphadenopathy. --No pelvic or inguinal lymphadenopathy. Reproductive: Unremarkable Other: There is a small volume of free fluid in the abdomen and pelvis. There is anasarca. Musculoskeletal. 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 IMPRESSION: 1. New collection about the left kidney favored to represent a small perinephric hematoma in the setting of recent renal biopsy. 2. Anemia. 3. Anasarca. 4. No specific abnormality identified to explain the patient's symptoms. Electronically Signed   By: Katherine Mantle M.D.   On: 03/06/2021 19:47   ECHO TEE  Result Date: 03/06/2021    TRANSESOPHOGEAL ECHO REPORT   Patient Name:   Jerome Mccormick Date of Exam: 03/06/2021 Medical Rec #:  [id number]      Height:       67.0 in Accession #:    [id number]     Weight:       132.0 lb Date of Birth:  Mar 01, 1991      BSA:          1.695 m Patient Age:    30 years       BP:           121/73 mmHg Patient Gender: M              HR:            68 bpm. Exam Location:  [facility] Procedure: Transesophageal Echo, Color Doppler and Cardiac Doppler Indications:     Not listed on TEE check-in sheet  History:  Patient has prior history of Echocardiogram examinations, most                  recent 03/03/2021. Signs/Symptoms:Murmur.  Sonographer:     Cristela Blue RDCS (AE) Referring Phys:  [id number] Francee Nodal FURTH Diagnosing Phys: Debbe Odea MD PROCEDURE: The transesophogeal probe was passed without difficulty through the esophogus of the patient. Sedation performed by performing physician. The patient developed no complications during the procedure. IMPRESSIONS  1. Left ventricular ejection fraction, by estimation, is 60 to 65%. The left ventricle has normal function. The left ventricle has no regional wall motion abnormalities.  2. Right ventricular systolic function is normal. The right ventricular size is normal.  3. No left atrial/left atrial appendage thrombus was detected.  4. 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 The mitral valve is abnormal. Mild to moderate mitral valve regurgitation. 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  5. The aortic valve is tricuspid. Aortic valve regurgitation is not visualized. 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 There is no clear evidence for endocarditis. likely Etiology for ruptured chordae include mitral valve prolapse or endocarditis (since patient is bacteremic).  Recommend emperic treatment for endocarditis since patient is bacteremic. Surgical consult for mitral valve repair/replacement considerations can be obtained on outpatient basis (patient is hemodynamically stable and not in heart failure). FINDINGS  Left Ventricle: Left ventricular ejection fraction, by estimation, is 60 to 65%. The left ventricle has normal function. The left ventricle has no regional wall motion abnormalities.  The left ventricular internal cavity size was normal in size. Right Ventricle: The right ventricular size is normal. No increase in right ventricular wall thickness. Right ventricular systolic function is normal. Left Atrium: Left atrial size was normal in size. No left atrial/left atrial appendage thrombus was detected. Right Atrium: Right atrial size was normal in size. Pericardium: There is no evidence of pericardial effusion. 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 The mitral valve is abnormal. 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 Mild to moderate mitral valve regurgitation. Tricuspid Valve: The tricuspid valve is normal in structure. Tricuspid valve regurgitation is trivial. Aortic Valve: The aortic valve is tricuspid. Aortic valve regurgitation is not visualized. Pulmonic Valve: The pulmonic valve was normal in structure. Pulmonic valve regurgitation is trivial. Aorta: The aortic root is normal in size and structure. Venous: The inferior vena cava was not well visualized. IAS/Shunts: No atrial level shunt detected by color flow Doppler. Debbe Odea MD Electronically signed by Debbe Odea MD Signature Date/Time: 03/06/2021/5:00:13 PM    Final     Cardiac Studies   Transesophageal echo 1. Left ventricular ejection fraction, by estimation, is 60 to 65%. The  left ventricle has normal function. The left ventricle has no regional  wall motion abnormalities.  2. Right ventricular systolic function is normal. The right ventricular  size is normal.  3. No left atrial/left atrial appendage thrombus was detected.  4. 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  The mitral valve is abnormal. Mild to moderate mitral valve regurgitation.  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  5. The aortic valve is tricuspid. Aortic valve regurgitation is not   visualized.   Patient Profile     30 y.o. male with hx of etoh use, tobacco use presenting with sob, edema and  aki. Found to have moderate mitral regurgitation and possible flail posterior leaflet, transesophageal echo confirming mitral valve pathology, unable to exclude endocarditis  Assessment & Plan    Moderate mitral valve regurgitation/ruptured chordae/ Unable to exclude component of endocarditis given thickening of the valve -Plan is to treat for full course per ID -Given significant valve pathology, young age, will likely benefit from valve replacement -We will refer to cardiothoracic surgery Dr. Eugenia PancoastBartle/Owen for further evaluation.  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  Enterococcus bacteremia ID following, on broad-spectrum antibiotics  History of alcohol abuse Per the notes, not discussed with patient and mother  Long discussion with patient and mother at the bedside, all questions answered, discussed first treatment options for his mitral valve disease  Total encounter time more than 35 minutes  Greater than 50% was spent in counseling and coordination of care with the patient  For questions or updates, please contact [facility] Please consult [email or web address] for contact info under        Signed, Julien Nordmannimothy Khup Sapia, MD  03/07/2021, 3:38 PM

## 2021-03-07 NOTE — Progress Notes (Signed)
PROGRESS NOTE    HPI was taken from Dr. Crissie Reese:  Jerome Mccormick is a 30 y.o. male with no past medical history who presents for headaches.  Patient reports that for about the past week he has been having significant migraines.  He has had migraines in the past however these have persisted for longer than he usually after prior.  He has also had associated nausea and vomiting.  His mother eventually convinced him to present to the ED for evaluation.  On further history patient reports that he is also had significantly increased nasal congestion as well has nosebleeds.  Does not usually get nosebleeds, has not had any active bleeding from the nose but is having frequent bloody mucus.  On questioning admits that about 2 to 3 weeks ago he had intermittent gross hematuria in his urine but has not had any recently.  Reports that he does have swelling, feels that his face is swollen and does not look like it usually does, in addition feels like he has very swollen legs.  Endorses having had a rash, still mildly present on his legs but was much worse previously.  He reports he smokes about a half a pack a day.  His father had hypertension and diabetes, passed away from complications due to end-stage renal disease on dialysis.  No other family history of kidney problems.  In the ED initial vital signs unremarkable and notable for no hypertension.  Initial management and treatment focused on evaluation of headaches, he was given Benadryl, Toradol, Reglan, as well as IV fluids and sent for CT scan of the head which was unremarkable.  Baseline labs subsequently returned with unexpected results of CMP with creatinine of 4.3 and BUN of 75, sodium 130, albumin 2.7, remainder unremarkable.  CBC showed anemia with hemoglobin of 8.8 but otherwise unremarkable.  UA notable for greater than 50 RBC and WBCs as well as large amount of protein.  UDS positive for cannabinoids and tricyclics.  CT renal stone study was  obtained and showed unremarkable kidneys and a small amount of free fluid in the pelvis of unclear etiology.  Nephrology was contacted and recommended work-up for possible vasculitis.   Hospital course from Dr. 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 Pt will f/u w/ CT surg as an outpatient as per cardio. Furthermore, pt's blood cxs were growing enterococcus faecalis and pt was found to have AKI likely secondary above stated infection. Pt had kidney biopsy on 03/04/21 and prelim path shows glomerulonephritis. Cr is trending down slightly today. No HD indicated currently. For more information, please see other progress/consult notes.    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 Repeat blood cxs today as per ID  Bacteremia: blood cx growing enterococcus faecalis, sens pending still. Continue on IV abxs. Will need colonoscopy outpatient w/ GI as per ID   AKI: w/ hematuria & proteinuria. Baseline Cr is 0.9. Cr is trending down slightly today. ANA, ribonucleic protein ab is positive. S/p kidney biopsy on 03/04/21, awaiting on pathology. Prelim path shows glomerulonephritis & likely secondary  to above stated infection. Nephro following and recs apprec  Flail/ruptured posterior chordae : echo shows normal EF, normal diastolic function & MV w/ possible prolapse & possible flail leaflet & associated moderate MV regurgitation. 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 Pt will f/u w/ CT surg as an outpatient as per cardio .  Cardio following and recs apprec  Normocytic anemia: H&H are labile. No need for a transfusion currently   Hyperkalemia: resolved   Hyponatremia: etiology unclear, labile. Will continue to monitor   Hx of alcohol abuse: continue on CIWA     DVT prophylaxis: SCDs Code Status: full  Family Communication:  Disposition Plan: likely d/c back home   Level of care: Med-Surg   Status is: Inpatient  Remains inpatient appropriate because:Ongoing diagnostic testing needed not appropriate for outpatient work up, IV treatments appropriate due to intensity of illness or inability to take PO and Inpatient level of care appropriate due to severity of illness   Dispo: The patient is from: Home              Anticipated d/c is to: Home              Patient currently is not medically stable to d/c.   Difficult to place patient Yes        Consultants:   Cardio  nephro  CT surg    Procedures:    Antimicrobials: ampicillin, ceftriaxone    Subjective: Pt c/o fatigue   Objective: Vitals:   03/06/21 1636 03/06/21 2003 03/06/21 2320 03/07/21 0446  BP: 133/75 113/74 130/85 114/71  Pulse: 80 78 79 85  Resp: 16 20 20 20   Temp: 97.6 F (36.4 C) 98.5 F (36.9 C) 97.9 F (36.6 C) 98.4 F (36.9 C)  TempSrc: Oral Oral Oral Oral  SpO2:  100% 100% 99%  Weight:      Height:        Intake/Output Summary (Last 24 hours) at 03/07/2021 0740 Last data filed at 03/07/2021 0500 Gross per 24 hour  Intake 578 ml  Output 200 ml  Net 378 ml   Filed Weights   03/02/21 1533 03/06/21 0859  Weight: 62.6 kg 59.9 kg    Examination:  General exam: Appears comfortable but very flat affect & mood  Respiratory system: clear breath sounds b/l. No wheezes, rales Cardiovascular system: S1/S2+. Systolic murmur.  Gastrointestinal system: Abd is soft, NT, ND & hypoactive bowel sounds  Central nervous system: Alert and oriented. Moves all 4 extremities  Psychiatry: Judgement and insight appear  normal. Very flat mood and affect    Data Reviewed: I have personally reviewed following labs and imaging studies  CBC: Recent Labs  Lab 03/02/21 1631 03/03/21 0552 03/04/21 0633 03/06/21 0444 03/07/21 0631  WBC 7.6 9.5 7.0 6.3 7.7  NEUTROABS 5.7  --   --   --   --   HGB 8.8* 9.2* 9.0* 9.9* 8.3*  HCT 27.5* 28.6* 28.0* 31.1* 25.8*  MCV 88.1 86.9 87.5 85.9 86.3  PLT 309 319 295 287 292   Basic Metabolic Panel: Recent Labs  Lab 03/03/21 0552 03/04/21 0633 03/05/21 0454 03/06/21 0444 03/07/21 0631  NA 133* 133* 132* 133* 132*  K 4.4 4.2 4.8 5.9* 4.7  CL 103 103 103 102 101  CO2 21* 21* 23 23 22   GLUCOSE 115* 106* 108* 143* 111*  BUN 75* 73* 72* 79* 80*  CREATININE 4.10* 4.35* 4.22* 5.00* 4.18*  CALCIUM 7.1* 7.4* 7.7* 8.4* 7.8*  MG  --   --   --   --  3.1*  PHOS  --  5.6* 5.7* 7.7* 6.4*   GFR: Estimated Creatinine Clearance: 21.9 mL/min (A) (by C-G formula based on SCr of 4.18 mg/dL (H)). Liver Function Tests: Recent Labs  Lab 03/02/21 1631 03/04/21 [id number] 03/05/21 0454 03/06/21 0444 03/07/21 0631  AST 15  --   --   --   --   ALT 12  --   --   --   --   ALKPHOS 46  --   --   --   --   BILITOT 0.4  --   --   --   --   PROT 6.5  --   --   --   --   ALBUMIN 2.7* 2.3* 2.3* 2.5* 2.3*   No results for input(s): LIPASE, AMYLASE in the last 168 hours. No results for input(s): AMMONIA in the last 168 hours. Coagulation Profile: Recent Labs  Lab 03/04/21 0633  INR 1.2   Cardiac Enzymes: No results for input(s): CKTOTAL, CKMB, CKMBINDEX, TROPONINI in the last 168 hours. BNP (last 3 results) No results for input(s): PROBNP in the last 8760 hours. HbA1C: No results for input(s): HGBA1C in the last 72 hours. CBG: No results for input(s): GLUCAP in the last 168 hours. Lipid Profile: No results for input(s): CHOL, HDL, LDLCALC, TRIG, CHOLHDL, LDLDIRECT in the last 72 hours. Thyroid Function Tests: No results for input(s): TSH, T4TOTAL, FREET4, T3FREE, THYROIDAB in  the last 72 hours. Anemia Panel: No results for input(s): VITAMINB12, FOLATE, FERRITIN, TIBC, IRON, RETICCTPCT in the last 72 hours. Sepsis Labs: No results for input(s): PROCALCITON, LATICACIDVEN in the last 168 hours.  Recent Results (from the past 240 hour(s))  Resp Panel by RT-PCR (Flu A&B, Covid) Nasopharyngeal Swab     Status: None   Collection Time: 03/02/21  4:31 PM   Specimen: Nasopharyngeal Swab; Nasopharyngeal(NP) swabs in vial transport medium  Result Value Ref Range Status   SARS Coronavirus 2 by RT PCR NEGATIVE NEGATIVE Final    Comment: (NOTE) SARS-CoV-2 target nucleic acids are NOT DETECTED.  The SARS-CoV-2 RNA is generally detectable in upper respiratory specimens during the acute phase of infection. The lowest concentration of SARS-CoV-2 viral copies this assay can detect is 138 copies/mL. A negative result does not preclude SARS-Cov-2 infection and should not be used as the sole basis for treatment or other patient management decisions. A negative result may occur with  improper specimen collection/handling, submission of specimen other than nasopharyngeal swab, presence of viral mutation(s) within the areas targeted by this assay, and inadequate number of viral copies(<138 copies/mL). A negative result must be combined with clinical observations, patient history, and epidemiological information. The expected result is Negative.  Fact Sheet for Patients:  BloggerCourse.comhttps://www.fda.gov/media/152166/download  Fact Sheet for Healthcare Providers:  SeriousBroker.ithttps://www.fda.gov/media/152162/download  This test is no t yet approved or cleared by the [place] FDA and  has been authorized for detection and/or diagnosis of SARS-CoV-2 by FDA under an Emergency Use Authorization (EUA). This EUA will remain  in effect (meaning this test can be used) for the duration of the COVID-19 declaration under Section 564(b)(1) of the Act, 21 U.S.C.section 360bbb-3(b)(1), unless the  authorization is terminated  or revoked sooner.       Influenza A by PCR NEGATIVE NEGATIVE Final   Influenza B by PCR NEGATIVE NEGATIVE Final    Comment: (NOTE) The Xpert Xpress SARS-CoV-2/FLU/RSV plus assay is intended as an aid  in the diagnosis of influenza from Nasopharyngeal swab specimens and should not be used as a sole basis for treatment. Nasal washings and aspirates are unacceptable for Xpert Xpress SARS-CoV-2/FLU/RSV testing.  Fact Sheet for Patients: BloggerCourse.com  Fact Sheet for Healthcare Providers: SeriousBroker.it  This test is not yet approved or cleared by the [place] FDA and has been authorized for detection and/or diagnosis of SARS-CoV-2 by FDA under an Emergency Use Authorization (EUA). This EUA will remain in effect (meaning this test can be used) for the duration of the COVID-19 declaration under Section 564(b)(1) of the Act, 21 U.S.C. section 360bbb-3(b)(1), unless the authorization is terminated or revoked.  Performed at [facility], [address] [place], [place] [address]   Group A Strep by PCR     Status: None   Collection Time: 03/05/21  4:46 PM   Specimen: Throat; Sterile Swab  Result Value Ref Range Status   Group A Strep by PCR NOT DETECTED NOT DETECTED Final    Comment: Performed at [facility], [address] [place], [place] [address]  Culture, blood (Routine X 2) w Reflex to ID Panel     Status: None (Preliminary result)   Collection Time: 03/05/21  5:46 PM   Specimen: BLOOD  Result Value Ref Range Status   Specimen Description BLOOD LEFT ANTECUBITAL  Final   Special Requests   Final    BOTTLES DRAWN AEROBIC AND ANAEROBIC Blood Culture adequate volume   Culture  Setup Time   Final    GRAM POSITIVE COCCI IN BOTH AEROBIC AND ANAEROBIC BOTTLES CRITICAL VALUE NOTED.  VALUE IS CONSISTENT WITH PREVIOUSLY REPORTED AND CALLED VALUE. Performed at [facility], [address] [place], [place] [address]    Culture [facility] POSITIVE COCCI  Final   Report Status PENDING  Incomplete  Culture, blood (Routine X 2) w Reflex to ID Panel     Status: None (Preliminary result)   Collection Time: 03/05/21  5:50 PM   Specimen: BLOOD  Result Value Ref Range Status   Specimen Description BLOOD RIGHT ANTECUBITAL  Final   Special Requests   Final    BOTTLES DRAWN AEROBIC AND ANAEROBIC Blood Culture adequate volume   Culture  Setup Time   Final    Organism ID to follow GRAM POSITIVE COCCI IN BOTH AEROBIC AND ANAEROBIC BOTTLES CRITICAL RESULT CALLED TO, READ BACK BY AND VERIFIED WITH: SUSAN WATSON AT 1146 03/06/21 SDR Performed at [facility], [address] [place], [place] [address]    Culture GRAM POSITIVE COCCI  Final   Report Status PENDING  Incomplete  Blood Culture ID Panel (Reflexed)     Status: Abnormal   Collection Time: 03/05/21  5:50 PM  Result Value Ref Range Status   Enterococcus faecalis DETECTED (A) NOT DETECTED Final    Comment: CRITICAL RESULT CALLED TO, READ BACK BY AND VERIFIED WITH:  SUSAN WATSON AT 1146 03/06/21 SDR    Enterococcus Faecium NOT DETECTED NOT DETECTED Final   Listeria monocytogenes NOT DETECTED NOT DETECTED Final   Staphylococcus species NOT DETECTED NOT DETECTED Final   Staphylococcus aureus (BCID) NOT DETECTED NOT DETECTED Final   Staphylococcus epidermidis NOT DETECTED NOT DETECTED Final   Staphylococcus lugdunensis NOT DETECTED NOT DETECTED Final   Streptococcus species NOT DETECTED NOT DETECTED Final   Streptococcus agalactiae NOT DETECTED NOT DETECTED Final   Streptococcus pneumoniae NOT DETECTED NOT DETECTED Final   Streptococcus pyogenes NOT DETECTED NOT DETECTED Final   A.calcoaceticus-baumannii NOT  DETECTED NOT DETECTED Final   Bacteroides fragilis NOT DETECTED NOT DETECTED Final   Enterobacterales NOT DETECTED NOT DETECTED Final   Enterobacter cloacae complex NOT DETECTED NOT DETECTED  Final   Escherichia coli NOT DETECTED NOT DETECTED Final   Klebsiella aerogenes NOT DETECTED NOT DETECTED Final   Klebsiella oxytoca NOT DETECTED NOT DETECTED Final   Klebsiella pneumoniae NOT DETECTED NOT DETECTED Final   Proteus species NOT DETECTED NOT DETECTED Final   Salmonella species NOT DETECTED NOT DETECTED Final   Serratia marcescens NOT DETECTED NOT DETECTED Final   Haemophilus influenzae NOT DETECTED NOT DETECTED Final   Neisseria meningitidis NOT DETECTED NOT DETECTED Final   Pseudomonas aeruginosa NOT DETECTED NOT DETECTED Final   Stenotrophomonas maltophilia NOT DETECTED NOT DETECTED Final   Candida albicans NOT DETECTED NOT DETECTED Final   Candida auris NOT DETECTED NOT DETECTED Final   Candida glabrata NOT DETECTED NOT DETECTED Final   Candida krusei NOT DETECTED NOT DETECTED Final   Candida parapsilosis NOT DETECTED NOT DETECTED Final   Candida tropicalis NOT DETECTED NOT DETECTED Final   Cryptococcus neoformans/gattii NOT DETECTED NOT DETECTED Final   Vancomycin resistance NOT DETECTED NOT DETECTED Final    Comment: Performed at [facility], [address] [place], [place] [address]         Radiology Studies: CT ABDOMEN PELVIS WO CONTRAST  Result Date: 03/06/2021 CLINICAL DATA:  Chronic diarrhea and weight loss. Left-sided abdominal pain for 1 week. Poor kidney function. EXAM: CT ABDOMEN AND PELVIS WITHOUT CONTRAST TECHNIQUE: Multidetector CT imaging of the abdomen and pelvis was performed following the standard protocol without IV contrast. COMPARISON:  03/02/2021 FINDINGS: Lower chest: The lung bases are clear. The heart size is normal. The intracardiac blood pool is hypodense relative to the adjacent myocardium consistent with anemia. Hepatobiliary: The liver is normal. Normal gallbladder.There is no biliary ductal dilation. Pancreas: Normal contours without ductal dilatation. No peripancreatic fluid collection. Spleen: There is a stable hypodense area  in the splenic parenchyma towards the hilum. Adrenals/Urinary Tract: --Adrenal glands: Unremarkable. --Right kidney/ureter: No hydronephrosis or radiopaque kidney stones. --Left kidney/ureter: In the left perinephric space there is a new air and fluid collection abutting the lower pole of the left kidney. This collection measures approximately 5.9 cm across. This is likely a small perinephric hematoma in the setting of recent renal biopsy. --Urinary bladder: There is diffuse bladder wall thickening. Stomach/Bowel: --Stomach/Duodenum: No hiatal hernia or other gastric abnormality. Normal duodenal course and caliber. --Small bowel: There is mild wall thickening of several loops of small bowel in the mid left abdomen. --Colon: There appears to be mild wall thickening of the descending colon. --Appendix: Normal. Vascular/Lymphatic: Normal course and caliber of the major abdominal vessels. --No retroperitoneal lymphadenopathy. --No mesenteric lymphadenopathy. --No pelvic or inguinal lymphadenopathy. Reproductive: Unremarkable Other: There is a small volume of free fluid in the abdomen and pelvis. There is anasarca. Musculoskeletal. 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 IMPRESSION: 1. New collection about the left kidney favored to represent a small perinephric hematoma in the setting of recent renal biopsy. 2. Anemia. 3. Anasarca. 4. No specific abnormality identified to explain the patient's symptoms. Electronically Signed   By: Katherine Mantle M.D.   On: 03/06/2021 19:47   ECHO TEE  Result Date: 03/06/2021    TRANSESOPHOGEAL ECHO REPORT   Patient Name:   Jerome Mccormick Date of Exam: 03/06/2021 Medical Rec #:  [id number]      Height:  67.0 in Accession #:    [id number]     Weight:       132.0 lb Date of Birth:  1990/12/14      BSA:          1.695 m Patient Age:    30 years       BP:           121/73 mmHg Patient Gender: M              HR:           68 bpm. Exam  Location:  [facility] Procedure: Transesophageal Echo, Color Doppler and Cardiac Doppler Indications:     Not listed on TEE check-in sheet  History:         Patient has prior history of Echocardiogram examinations, most                  recent 03/03/2021. Signs/Symptoms:Murmur.  Sonographer:     Cristela Blue RDCS (AE) Referring Phys:  [id number] Francee Nodal FURTH Diagnosing Phys: Debbe Odea MD PROCEDURE: The transesophogeal probe was passed without difficulty through the esophogus of the patient. Sedation performed by performing physician. The patient developed no complications during the procedure. IMPRESSIONS  1. Left ventricular ejection fraction, by estimation, is 60 to 65%. The left ventricle has normal function. The left ventricle has no regional wall motion abnormalities.  2. Right ventricular systolic function is normal. The right ventricular size is normal.  3. No left atrial/left atrial appendage thrombus was detected.  4. 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 The mitral valve is abnormal. Mild to moderate mitral valve regurgitation. 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  5. The aortic valve is tricuspid. Aortic valve regurgitation is not visualized. 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 There is no clear evidence for endocarditis. likely Etiology for ruptured chordae include mitral valve prolapse or endocarditis (since patient is bacteremic).  Recommend emperic treatment for endocarditis since patient is bacteremic. Surgical consult for mitral valve repair/replacement considerations can be obtained on outpatient basis (patient is hemodynamically stable and not in heart failure). FINDINGS  Left Ventricle: Left ventricular ejection fraction, by estimation, is 60 to 65%. The left ventricle has normal function. The left ventricle has no regional wall motion abnormalities. The left ventricular  internal cavity size was normal in size. Right Ventricle: The right ventricular size is normal. No increase in right ventricular wall thickness. Right ventricular systolic function is normal. Left Atrium: Left atrial size was normal in size. No left atrial/left atrial appendage thrombus was detected. Right Atrium: Right atrial size was normal in size. Pericardium: There is no evidence of pericardial effusion. 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 The mitral valve is abnormal. 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 Mild to moderate mitral valve regurgitation. Tricuspid Valve: The tricuspid valve is normal in structure. Tricuspid valve regurgitation is trivial. Aortic Valve: The aortic valve is tricuspid. Aortic valve regurgitation is not visualized. Pulmonic Valve: The pulmonic valve was normal in structure. Pulmonic valve regurgitation is trivial. Aorta: The aortic root is normal in size and structure. Venous: The inferior vena cava was not well visualized. IAS/Shunts: No atrial level shunt detected by color flow Doppler. Debbe Odea MD Electronically signed by Debbe Odea MD Signature Date/Time: 03/06/2021/5:00:13 PM    Final  Scheduled Meds: . folic acid  1 mg Oral Daily  . multivitamin with minerals  1 tablet Oral Daily  . nicotine  14 mg Transdermal Daily  . thiamine  100 mg Oral Daily   Or  . thiamine  100 mg Intravenous Daily   Continuous Infusions: . ampicillin (OMNIPEN) IV 2 g (03/07/21 0503)  . cefTRIAXone (ROCEPHIN)  IV 2 g (03/06/21 2027)  . methocarbamol (ROBAXIN) IV       LOS: 5 days    Time spent: 35 mins     Charise Killian, MD [facility] Pager [phone]  If 7PM-7AM, please contact night-coverage 03/07/2021, 7:40 AM

## 2021-03-07 NOTE — Telephone Encounter (Signed)
Referral placed- forwarding message to [facility] contact as well to try to help expedite this appointment per Dr. Mariah Milling.

## 2021-03-07 NOTE — TOC Progression Note (Addendum)
Transition of Care [facility]) - Progression Note    Patient Details  Name: Jerome Mccormick MRN: [id number] Date of Birth: 10-05-1991  Transition of [facility]) CM/SW Contact  Beverly Sessions, RN Phone Number: 03/07/2021, 4:10 PM  Clinical Narrative:    Will need long term IV antibiotics at home at discharge Met with patient and mother at bedside.  Patient states that he lives at home with mother. Is currently employed, however uninsured.  Patient confirms that he has reliable transportation  Patient is in agreement to home health services.   Referral made to [facility] with [place] for charity home health.  Referral made to [facility] for [facility] infusion. Pam to come to [facility] to educate patient and mother  Patient does not have PCP.  New patient appointment made at [facility] on April 27th.  Information added to AVS.  I have reached out to Dr. Ola Spurr to see if he would be in agreement to sign home health order in the interim.  Awaiting return response.    Update.  Ola Spurr will sign home health orders until he gets to new PCP appointment.  Corene Cornea with [facility] notified    Expected Discharge Plan: Home/Self Care Barriers to Discharge: Continued Medical Work up  Expected Discharge Plan and Services Expected Discharge Plan: Home/Self Care       Living arrangements for the past 2 months: Single Family Home                                       Social Determinants of Health (SDOH) Interventions    Readmission Risk Interventions No flowsheet data found.

## 2021-03-07 NOTE — Plan of Care (Signed)
  Problem: Clinical Measurements: Goal: Cardiovascular complication will be avoided Outcome: Progressing   Problem: Activity: Goal: Risk for activity intolerance will decrease Outcome: Progressing   Problem: Nutrition: Goal: Adequate nutrition will be maintained Outcome: Progressing   Problem: Coping: Goal: Level of anxiety will decrease Outcome: Progressing   Problem: Elimination: Goal: Will not experience complications related to urinary retention Outcome: Progressing   Problem: Pain Managment: Goal: General experience of comfort will improve Outcome: Progressing   Problem: Safety: Goal: Ability to remain free from injury will improve Outcome: Progressing   

## 2021-03-07 NOTE — Progress Notes (Signed)
[facility] INFECTIOUS DISEASE PROGRESS NOTE Date of Admission:  03/02/2021     ID: Jerome Mccormick is a 30 y.o. male with ARF. Principal Problem:   AKI (acute kidney injury) (HCC) Active Problems:   Anemia   Cardiac murmur   Nonrheumatic mitral valve regurgitation   Subjective: No fevers.  White count 7.7.  Follow-up blood culture done and pending.  Creatinine is 4.18 which has been stable.  CT of the abdomen was done which showed anasarca and a small collection next to the left kidney that is likely is perinephric hematoma after the recent biopsy.  However there was no evidence of intra-abdominal pathology. TEE with mod MR and flail/ruptured posterior chordae. Being referred for CT surg eval  ROS  Eleven systems are reviewed and negative except per hpi  Medications:  Antibiotics Given (last 72 hours)    Date/Time Action Medication Dose Rate   03/06/21 1415 New Bag/Given   ampicillin (OMNIPEN) 2 g in sodium chloride 0.9 % 100 mL IVPB 2 g 300 mL/hr   03/06/21 2027 New Bag/Given   cefTRIAXone (ROCEPHIN) 2 g in sodium chloride 0.9 % 100 mL IVPB 2 g 200 mL/hr   03/06/21 2113 New Bag/Given   ampicillin (OMNIPEN) 2 g in sodium chloride 0.9 % 100 mL IVPB 2 g 300 mL/hr   03/07/21 0503 New Bag/Given   ampicillin (OMNIPEN) 2 g in sodium chloride 0.9 % 100 mL IVPB 2 g 300 mL/hr   03/07/21 0957 New Bag/Given   cefTRIAXone (ROCEPHIN) 2 g in sodium chloride 0.9 % 100 mL IVPB 2 g 200 mL/hr     . folic acid  1 mg Oral Daily  . multivitamin with minerals  1 tablet Oral Daily  . nicotine  14 mg Transdermal Daily  . thiamine  100 mg Oral Daily   Or  . thiamine  100 mg Intravenous Daily    Objective: Vital signs in last 24 hours: Temp:  [97.6 F (36.4 C)-98.5 F (36.9 C)] 97.9 F (36.6 C) (04/07 1140) Pulse Rate:  [68-85] 77 (04/07 1140) Resp:  [16-20] 18 (04/07 1140) BP: (113-133)/(69-85) 120/75 (04/07 1140) SpO2:  [99 %-100 %] 100 % (04/07 1140) Constitutional: He is oriented to  person, place, and time. He appears well-developed and well-nourished. No distress.  HENT: anicteric, Mouth/Throat: Oropharynx is clear and moist. No oropharyngeal exudate.  Cardiovascular: Normal rate, regular rhythm and normal heart sounds. 3/6 SM Pulmonary/Chest: Effort normal and breath sounds normal. No respiratory distress. He has no wheezes.  Abdominal: Soft. Bowel sounds are normal. He exhibits no distension. There is no tenderness.  Lymphadenopathy: He has no cervical adenopathy.  Neurological: He is alert and oriented to person, place, and time.  Skin: Skin is warm and dry. No rash noted. No erythema. No stigmata of endocarditis.  Psychiatric: He has a normal mood and affect. His behavior is normal.   Lab Results Recent Labs    03/06/21 0444 03/07/21 0631  WBC 6.3 7.7  HGB 9.9* 8.3*  HCT 31.1* 25.8*  NA 133* 132*  K 5.9* 4.7  CL 102 101  CO2 23 22  BUN 79* 80*  CREATININE 5.00* 4.18*    Microbiology: Results for orders placed or performed during the hospital encounter of 03/02/21  Resp Panel by RT-PCR (Flu A&B, Covid) Nasopharyngeal Swab     Status: None   Collection Time: 03/02/21  4:31 PM   Specimen: Nasopharyngeal Swab; Nasopharyngeal(NP) swabs in vial transport medium  Result Value Ref Range Status  SARS Coronavirus 2 by RT PCR NEGATIVE NEGATIVE Final    Comment: (NOTE) SARS-CoV-2 target nucleic acids are NOT DETECTED.  The SARS-CoV-2 RNA is generally detectable in upper respiratory specimens during the acute phase of infection. The lowest concentration of SARS-CoV-2 viral copies this assay can detect is 138 copies/mL. A negative result does not preclude SARS-Cov-2 infection and should not be used as the sole basis for treatment or other patient management decisions. A negative result may occur with  improper specimen collection/handling, submission of specimen other than nasopharyngeal swab, presence of viral mutation(s) within the areas targeted by this  assay, and inadequate number of viral copies(<138 copies/mL). A negative result must be combined with clinical observations, patient history, and epidemiological information. The expected result is Negative.  Fact Sheet for Patients:  BloggerCourse.comhttps://www.fda.gov/media/152166/download  Fact Sheet for Healthcare Providers:  SeriousBroker.ithttps://www.fda.gov/media/152162/download  This test is no t yet approved or cleared by the [place] FDA and  has been authorized for detection and/or diagnosis of SARS-CoV-2 by FDA under an Emergency Use Authorization (EUA). This EUA will remain  in effect (meaning this test can be used) for the duration of the COVID-19 declaration under Section 564(b)(1) of the Act, 21 U.S.C.section 360bbb-3(b)(1), unless the authorization is terminated  or revoked sooner.       Influenza A by PCR NEGATIVE NEGATIVE Final   Influenza B by PCR NEGATIVE NEGATIVE Final    Comment: (NOTE) The Xpert Xpress SARS-CoV-2/FLU/RSV plus assay is intended as an aid in the diagnosis of influenza from Nasopharyngeal swab specimens and should not be used as a sole basis for treatment. Nasal washings and aspirates are unacceptable for Xpert Xpress SARS-CoV-2/FLU/RSV testing.  Fact Sheet for Patients: BloggerCourse.comhttps://www.fda.gov/media/152166/download  Fact Sheet for Healthcare Providers: SeriousBroker.ithttps://www.fda.gov/media/152162/download  This test is not yet approved or cleared by the [place] FDA and has been authorized for detection and/or diagnosis of SARS-CoV-2 by FDA under an Emergency Use Authorization (EUA). This EUA will remain in effect (meaning this test can be used) for the duration of the COVID-19 declaration under Section 564(b)(1) of the Act, 21 U.S.C. section 360bbb-3(b)(1), unless the authorization is terminated or revoked.  Performed at [facility], [address] [place], [place] [address]   Group A Strep by PCR     Status: None   Collection Time: 03/05/21  4:46 PM    Specimen: Throat; Sterile Swab  Result Value Ref Range Status   Group A Strep by PCR NOT DETECTED NOT DETECTED Final    Comment: Performed at [facility], [address] [place], [place] [address]  Culture, blood (Routine X 2) w Reflex to ID Panel     Status: Abnormal (Preliminary result)   Collection Time: 03/05/21  5:46 PM   Specimen: BLOOD  Result Value Ref Range Status   Specimen Description   Final    BLOOD LEFT ANTECUBITAL Performed at [facility], [address] [place], [place] [address]    Special Requests   Final    BOTTLES DRAWN AEROBIC AND ANAEROBIC Blood Culture adequate volume Performed at [facility], [address] [place], [place] [address]    Culture  Setup Time   Final    GRAM POSITIVE COCCI IN BOTH AEROBIC AND ANAEROBIC BOTTLES CRITICAL VALUE NOTED.  VALUE IS CONSISTENT WITH PREVIOUSLY REPORTED AND CALLED VALUE. Performed at [facility], [address] [place], [place] [address]    Culture ENTEROCOCCUS FAECALIS (A)  Final   Report Status PENDING  Incomplete  Culture, blood (Routine X 2) w Reflex to ID Panel     Status: Abnormal (Preliminary result)   Collection Time: 03/05/21  5:50 PM   Specimen: BLOOD  Result Value Ref Range Status   Specimen Description   Final    BLOOD RIGHT ANTECUBITAL Performed at [facility], [address], [place], [place] [address]    Special Requests   Final    BOTTLES DRAWN AEROBIC AND ANAEROBIC Blood Culture adequate volume Performed at [facility], [address] [place], [place] [address]    Culture  Setup Time   Final    Organism ID to follow GRAM POSITIVE COCCI IN BOTH AEROBIC AND ANAEROBIC BOTTLES CRITICAL RESULT CALLED TO, READ BACK BY AND VERIFIED WITH: Algie Coffer AT 1146 03/06/21 SDR Performed at [facility], [address], [place], [place] [address]    Culture (A)  Final    ENTEROCOCCUS FAECALIS SUSCEPTIBILITIES TO FOLLOW Performed  at [facility], [address] [place], [place] [address]    Report Status PENDING  Incomplete  Blood Culture ID Panel (Reflexed)     Status: Abnormal   Collection Time: 03/05/21  5:50 PM  Result Value Ref Range Status   Enterococcus faecalis DETECTED (A) NOT DETECTED Final    Comment: CRITICAL RESULT CALLED TO, READ BACK BY AND VERIFIED WITH:  SUSAN WATSON AT 1146 03/06/21 SDR    Enterococcus Faecium NOT DETECTED NOT DETECTED Final   Listeria monocytogenes NOT DETECTED NOT DETECTED Final   Staphylococcus species NOT DETECTED NOT DETECTED Final   Staphylococcus aureus (BCID) NOT DETECTED NOT DETECTED Final   Staphylococcus epidermidis NOT DETECTED NOT DETECTED Final   Staphylococcus lugdunensis NOT DETECTED NOT DETECTED Final   Streptococcus species NOT DETECTED NOT DETECTED Final   Streptococcus agalactiae NOT DETECTED NOT DETECTED Final   Streptococcus pneumoniae NOT DETECTED NOT DETECTED Final   Streptococcus pyogenes NOT DETECTED NOT DETECTED Final   A.calcoaceticus-baumannii NOT DETECTED NOT DETECTED Final   Bacteroides fragilis NOT DETECTED NOT DETECTED Final   Enterobacterales NOT DETECTED NOT DETECTED Final   Enterobacter cloacae complex NOT DETECTED NOT DETECTED Final   Escherichia coli NOT DETECTED NOT DETECTED Final   Klebsiella aerogenes NOT DETECTED NOT DETECTED Final   Klebsiella oxytoca NOT DETECTED NOT DETECTED Final   Klebsiella pneumoniae NOT DETECTED NOT DETECTED Final   Proteus species NOT DETECTED NOT DETECTED Final   Salmonella species NOT DETECTED NOT DETECTED Final   Serratia marcescens NOT DETECTED NOT DETECTED Final   Haemophilus influenzae NOT DETECTED NOT DETECTED Final   Neisseria meningitidis NOT DETECTED NOT DETECTED Final   Pseudomonas aeruginosa NOT DETECTED NOT DETECTED Final   Stenotrophomonas maltophilia NOT DETECTED NOT DETECTED Final   Candida albicans NOT DETECTED NOT DETECTED Final   Candida auris NOT DETECTED NOT DETECTED Final    Candida glabrata NOT DETECTED NOT DETECTED Final   Candida krusei NOT DETECTED NOT DETECTED Final   Candida parapsilosis NOT DETECTED NOT DETECTED Final   Candida tropicalis NOT DETECTED NOT DETECTED Final   Cryptococcus neoformans/gattii NOT DETECTED NOT DETECTED Final   Vancomycin resistance NOT DETECTED NOT DETECTED Final    Comment: Performed at [facility], [address] [place], [place] [address]    Studies/Results: CT ABDOMEN PELVIS WO CONTRAST  Result Date: 03/06/2021 CLINICAL DATA:  Chronic diarrhea and weight loss. Left-sided abdominal pain for 1 week. Poor kidney function. EXAM: CT ABDOMEN AND PELVIS WITHOUT CONTRAST TECHNIQUE: Multidetector CT imaging of the abdomen and  pelvis was performed following the standard protocol without IV contrast. COMPARISON:  03/02/2021 FINDINGS: Lower chest: The lung bases are clear. The heart size is normal. The intracardiac blood pool is hypodense relative to the adjacent myocardium consistent with anemia. Hepatobiliary: The liver is normal. Normal gallbladder.There is no biliary ductal dilation. Pancreas: Normal contours without ductal dilatation. No peripancreatic fluid collection. Spleen: There is a stable hypodense area in the splenic parenchyma towards the hilum. Adrenals/Urinary Tract: --Adrenal glands: Unremarkable. --Right kidney/ureter: No hydronephrosis or radiopaque kidney stones. --Left kidney/ureter: In the left perinephric space there is a new air and fluid collection abutting the lower pole of the left kidney. This collection measures approximately 5.9 cm across. This is likely a small perinephric hematoma in the setting of recent renal biopsy. --Urinary bladder: There is diffuse bladder wall thickening. Stomach/Bowel: --Stomach/Duodenum: No hiatal hernia or other gastric abnormality. Normal duodenal course and caliber. --Small bowel: There is mild wall thickening of several loops of small bowel in the mid left abdomen. --Colon:  There appears to be mild wall thickening of the descending colon. --Appendix: Normal. Vascular/Lymphatic: Normal course and caliber of the major abdominal vessels. --No retroperitoneal lymphadenopathy. --No mesenteric lymphadenopathy. --No pelvic or inguinal lymphadenopathy. Reproductive: Unremarkable Other: There is a small volume of free fluid in the abdomen and pelvis. There is anasarca. Musculoskeletal. 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 IMPRESSION: 1. New collection about the left kidney favored to represent a small perinephric hematoma in the setting of recent renal biopsy. 2. Anemia. 3. Anasarca. 4. No specific abnormality identified to explain the patient's symptoms. Electronically Signed   By: Katherine Mantle M.D.   On: 03/06/2021 19:47   ECHO TEE  Result Date: 03/06/2021    TRANSESOPHOGEAL ECHO REPORT   Patient Name:   Jerome Mccormick Date of Exam: 03/06/2021 Medical Rec #:  [id number]      Height:       67.0 in Accession #:    [id number]     Weight:       132.0 lb Date of Birth:  11/26/1991      BSA:          1.695 m Patient Age:    30 years       BP:           121/73 mmHg Patient Gender: M              HR:           68 bpm. Exam Location:  [facility] Procedure: Transesophageal Echo, Color Doppler and Cardiac Doppler Indications:     Not listed on TEE check-in sheet  History:         Patient has prior history of Echocardiogram examinations, most                  recent 03/03/2021. Signs/Symptoms:Murmur.  Sonographer:     Cristela Blue RDCS (AE) Referring Phys:  [id number] Francee Nodal FURTH Diagnosing Phys: Debbe Odea MD PROCEDURE: The transesophogeal probe was passed without difficulty through the esophogus of the patient. Sedation performed by performing physician. The patient developed no complications during the procedure. IMPRESSIONS  1. Left ventricular ejection fraction, by estimation, is 60 to 65%. The left ventricle has normal function. The left  ventricle has no regional wall motion abnormalities.  2. Right ventricular systolic function is normal. The right ventricular size is normal.  3. No left atrial/left atrial appendage thrombus was detected.  4. 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 The mitral valve is abnormal. Mild to moderate mitral valve regurgitation. 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  5. The aortic valve is tricuspid. Aortic valve regurgitation is not visualized. 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 There is no clear evidence for endocarditis. likely Etiology for ruptured chordae include mitral valve prolapse or endocarditis (since patient is bacteremic).  Recommend emperic treatment for endocarditis since patient is bacteremic. Surgical consult for mitral valve repair/replacement considerations can be obtained on outpatient basis (patient is hemodynamically stable and not in heart failure). FINDINGS  Left Ventricle: Left ventricular ejection fraction, by estimation, is 60 to 65%. The left ventricle has normal function. The left ventricle has no regional wall motion abnormalities. The left ventricular internal cavity size was normal in size. Right Ventricle: The right ventricular size is normal. No increase in right ventricular wall thickness. Right ventricular systolic function is normal. Left Atrium: Left atrial size was normal in size. No left atrial/left atrial appendage thrombus was detected. Right Atrium: Right atrial size was normal in size. Pericardium: There is no evidence of pericardial effusion. 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 The mitral valve is abnormal. 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 Mild to moderate mitral valve regurgitation. Tricuspid Valve: The tricuspid valve is normal in structure. Tricuspid valve  regurgitation is trivial. Aortic Valve: The aortic valve is tricuspid. Aortic valve regurgitation is not visualized. Pulmonic Valve: The pulmonic valve was normal in structure. Pulmonic valve regurgitation is trivial. Aorta: The aortic root is normal in size and structure. Venous: The inferior vena cava was not well visualized. IAS/Shunts: No atrial level shunt detected by color flow Doppler. Debbe Odea MD Electronically signed by Debbe Odea MD Signature Date/Time: 03/06/2021/5:00:13 PM    Final     Assessment/Plan: Delrae Rend is a 30 y.o. 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  Dorna Bloom so far negative for etiology of glomerulonephritis. He is HIV neg.  He did have prodromal sxs of fevers and chills for several weeks and I am concerned for SBE with associated glomerulonephritis. Strongly denies any IVDU so if has SBE possible oral source.  He did not have bcx done on admit and received IV abx in ED.  03/06/21- bcx + 2/2 enterococcus. TEE with flail leaflet He tells me he has issues with chronic nausea, diarrhea, wt loss. Has had blood in his stool in past. 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 Being referred for CT surg eval Hold on picc line until cxs cleared. Repeat BCX pending to document clearance  Would suggest GI fu otpt for colonoscopy.  Thank you very much for the consult. Will follow with you.  Mick Sell   03/07/2021, 1:57 PM

## 2021-03-07 NOTE — Plan of Care (Addendum)
Pt Axox4. Calm and cooperative and able to voice his needs. Pt on IV Abx. One Lokelma paquet adm po for K 5.9. Pt reported having a BM about 30 min after drinking the Lokelma mixture with water. Will follow up labs. Independent. Safety measures in place. Will continue to monitor.  Problem: Education: Goal: Knowledge of General Education information will improve Description: Including pain rating scale, medication(s)/side effects and non-pharmacologic comfort measures Outcome: Progressing   Problem: Health Behavior/Discharge Planning: Goal: Ability to manage health-related needs will improve Outcome: Progressing   Problem: Health Behavior/Discharge Planning: Goal: Ability to manage health-related needs will improve Outcome: Progressing   Problem: Clinical Measurements: Goal: Ability to maintain clinical measurements within normal limits will improve Outcome: Progressing Goal: Will remain free from infection Outcome: Progressing Goal: Diagnostic test results will improve Outcome: Progressing Goal: Respiratory complications will improve Outcome: Progressing Goal: Cardiovascular complication will be avoided Outcome: Progressing   Problem: Activity: Goal: Risk for activity intolerance will decrease Outcome: Progressing   Problem: Nutrition: Goal: Adequate nutrition will be maintained Outcome: Progressing   Problem: Coping: Goal: Level of anxiety will decrease Outcome: Progressing   Problem: Elimination: Goal: Will not experience complications related to bowel motility Outcome: Progressing Goal: Will not experience complications related to urinary retention Outcome: Progressing   Problem: Pain Managment: Goal: General experience of comfort will improve Outcome: Progressing   Problem: Safety: Goal: Ability to remain free from injury will improve Outcome: Progressing   Problem: Skin Integrity: Goal: Risk for impaired skin integrity will decrease Outcome: Progressing

## 2021-03-07 NOTE — Progress Notes (Addendum)
[place], [place] 03/07/21  Subjective:   LOS: 5  Jerome Mccormick is a 30 y.o. male with an extensive history of NSAID use and alcohol use. He presents to ED with 7 days of headaches and congestion. He is found to increased Creatinine 4.31.  Patient seen laying in bed Alert and oriented States he feels better Denies nausea and shortness of breath Continues to void Denies pain or discomfort   Objective:  Vital signs in last 24 hours:  Temp:  [97.6 F (36.4 C)-98.5 F (36.9 C)] 97.9 F (36.6 C) (04/07 1140) Pulse Rate:  [68-85] 77 (04/07 1140) Resp:  [16-20] 18 (04/07 1140) BP: (113-133)/(69-85) 120/75 (04/07 1140) SpO2:  [99 %-100 %] 100 % (04/07 1140)  Weight change:  Filed Weights   03/02/21 1533 03/06/21 0859  Weight: 62.6 kg 59.9 kg    Intake/Output:    Intake/Output Summary (Last 24 hours) at 03/07/2021 1451 Last data filed at 03/07/2021 1346 Gross per 24 hour  Intake 1238 ml  Output 200 ml  Net 1038 ml     Physical Exam: General: NAD, resting in bed  HEENT Anicteric, moist oral mucosa  Pulm/lungs Clear bilaterally, normal breathing pattern  CVS/Heart S1S2 present, regular rate  Abdomen:  Soft, non tender  Extremities: No peripheral edema  Neurologic: Alert, oriented, able to answer questions  Skin: No rashes or masses          Basic Metabolic Panel:  Recent Labs  Lab 03/03/21 0552 03/04/21 0633 03/05/21 0454 03/06/21 0444 03/07/21 0631  NA 133* 133* 132* 133* 132*  K 4.4 4.2 4.8 5.9* 4.7  CL 103 103 103 102 101  CO2 21* 21* _0 GLUCOSE 115* 106* 108* 143* 111*  BUN 75* 73* 72* 79* 80*  CREATININE 4.10* 4.35* 4.22* 5.00* 4.18*  CALCIUM 7.1* 7.4* 7.7* 8.4* 7.8*  MG  --   --   --   --  3.1*  PHOS  --  5.6* 5.7* 7.7* 6.4*     CBC: Recent Labs  Lab 03/02/21 1631 03/03/21 0552 03/04/21 0633 03/06/21 0444 03/07/21 0631  WBC 7.6 9.5 7.0 6.3 7.7  NEUTROABS 5.7  --   --   --   --   HGB 8.8* 9.2* 9.0*  9.9* 8.3*  HCT 27.5* 28.6* 28.0* 31.1* 25.8*  MCV 88.1 86.9 87.5 85.9 86.3  PLT 309 319 295 287 292      Lab Results  Component Value Date   HEPBSAG NON REACTIVE 03/02/2021   HEPBSAB NON REACTIVE 03/02/2021      Microbiology:  Recent Results (from the past 240 hour(s))  Resp Panel by RT-PCR (Flu A&B, Covid) Nasopharyngeal Swab     Status: None   Collection Time: 03/02/21  4:31 PM   Specimen: Nasopharyngeal Swab; Nasopharyngeal(NP) swabs in vial transport medium  Result Value Ref Range Status   SARS Coronavirus 2 by RT PCR NEGATIVE NEGATIVE Final    Comment: (NOTE) SARS-CoV-2 target nucleic acids are NOT DETECTED.  The SARS-CoV-2 RNA is generally detectable in upper respiratory specimens during the acute phase of infection. The lowest concentration of SARS-CoV-2 viral copies this assay can detect is 138 copies/mL. A negative result does not preclude SARS-Cov-2 infection and should not be used as the sole basis for treatment or other patient management decisions. A negative result may occur with  improper specimen collection/handling, submission of specimen other than nasopharyngeal swab, presence of viral mutation(s) within the areas targeted by this assay, and  inadequate number of viral copies(<138 copies/mL). A negative result must be combined with clinical observations, patient history, and epidemiological information. The expected result is Negative.  Fact Sheet for Patients:  EntrepreneurPulse.com.au  Fact Sheet for Healthcare Providers:  IncredibleEmployment.be  This test is no t yet approved or cleared by the [place] FDA and  has been authorized for detection and/or diagnosis of SARS-CoV-2 by FDA under an Emergency Use Authorization (EUA). This EUA will remain  in effect (meaning this test can be used) for the duration of the COVID-19 declaration under Section 564(b)(1) of the Act, 21 U.S.C.section 360bbb-3(b)(1), unless  the authorization is terminated  or revoked sooner.       Influenza A by PCR NEGATIVE NEGATIVE Final   Influenza B by PCR NEGATIVE NEGATIVE Final    Comment: (NOTE) The Xpert Xpress SARS-CoV-2/FLU/RSV plus assay is intended as an aid in the diagnosis of influenza from Nasopharyngeal swab specimens and should not be used as a sole basis for treatment. Nasal washings and aspirates are unacceptable for Xpert Xpress SARS-CoV-2/FLU/RSV testing.  Fact Sheet for Patients: EntrepreneurPulse.com.au  Fact Sheet for Healthcare Providers: IncredibleEmployment.be  This test is not yet approved or cleared by the [place] FDA and has been authorized for detection and/or diagnosis of SARS-CoV-2 by FDA under an Emergency Use Authorization (EUA). This EUA will remain in effect (meaning this test can be used) for the duration of the COVID-19 declaration under Section 564(b)(1) of the Act, 21 U.S.C. section 360bbb-3(b)(1), unless the authorization is terminated or revoked.  Performed at [facility], [address] [place], [place] [address]   Group A Strep by PCR     Status: None   Collection Time: 03/05/21  4:46 PM   Specimen: Throat; Sterile Swab  Result Value Ref Range Status   Group A Strep by PCR NOT DETECTED NOT DETECTED Final    Comment: Performed at [facility], [address] [place], [place] [address]  Culture, blood (Routine X 2) w Reflex to ID Panel     Status: Abnormal (Preliminary result)   Collection Time: 03/05/21  5:46 PM   Specimen: BLOOD  Result Value Ref Range Status   Specimen Description   Final    BLOOD LEFT ANTECUBITAL Performed at [facility], [address] [place], [place] [address]    Special Requests   Final    BOTTLES DRAWN AEROBIC AND ANAEROBIC Blood Culture adequate volume Performed at [facility], [address] [place], [place] [address]    Culture  Setup Time   Final     GRAM POSITIVE COCCI IN BOTH AEROBIC AND ANAEROBIC BOTTLES CRITICAL VALUE NOTED.  VALUE IS CONSISTENT WITH PREVIOUSLY REPORTED AND CALLED VALUE. Performed at [facility], [address] [place], [place] [address]    Culture ENTEROCOCCUS FAECALIS (A)  Final   Report Status PENDING  Incomplete  Culture, blood (Routine X 2) w Reflex to ID Panel     Status: Abnormal (Preliminary result)   Collection Time: 03/05/21  5:50 PM   Specimen: BLOOD  Result Value Ref Range Status   Specimen Description   Final    BLOOD RIGHT ANTECUBITAL Performed at [facility], [address] [place], [place] [address]    Special Requests   Final    BOTTLES DRAWN AEROBIC AND ANAEROBIC Blood Culture adequate volume Performed at [facility], [address] [place], [place] [address]    Culture  Setup Time   Final  Organism ID to follow GRAM POSITIVE COCCI IN BOTH AEROBIC AND ANAEROBIC BOTTLES CRITICAL RESULT CALLED TO, READ BACK BY AND VERIFIED WITH: Laqueta Carina AT 1146 03/06/21 [place] Performed at [facility], [address], [place], [place] [address]    Culture (A)  Final    ENTEROCOCCUS FAECALIS SUSCEPTIBILITIES TO FOLLOW Performed at [facility], [place] [address], [place], [place] [address]    Report Status PENDING  Incomplete  Blood Culture ID Panel (Reflexed)     Status: Abnormal   Collection Time: 03/05/21  5:50 PM  Result Value Ref Range Status   Enterococcus faecalis DETECTED (A) NOT DETECTED Final    Comment: CRITICAL RESULT CALLED TO, READ BACK BY AND VERIFIED WITH:  SUSAN WATSON AT 1146 03/06/21 SDR    Enterococcus Faecium NOT DETECTED NOT DETECTED Final   Listeria monocytogenes NOT DETECTED NOT DETECTED Final   Staphylococcus species NOT DETECTED NOT DETECTED Final   Staphylococcus aureus (BCID) NOT DETECTED NOT DETECTED Final   Staphylococcus epidermidis NOT DETECTED NOT DETECTED Final   Staphylococcus lugdunensis NOT DETECTED NOT  DETECTED Final   Streptococcus species NOT DETECTED NOT DETECTED Final   Streptococcus agalactiae NOT DETECTED NOT DETECTED Final   Streptococcus pneumoniae NOT DETECTED NOT DETECTED Final   Streptococcus pyogenes NOT DETECTED NOT DETECTED Final   A.calcoaceticus-baumannii NOT DETECTED NOT DETECTED Final   Bacteroides fragilis NOT DETECTED NOT DETECTED Final   Enterobacterales NOT DETECTED NOT DETECTED Final   Enterobacter cloacae complex NOT DETECTED NOT DETECTED Final   Escherichia coli NOT DETECTED NOT DETECTED Final   Klebsiella aerogenes NOT DETECTED NOT DETECTED Final   Klebsiella oxytoca NOT DETECTED NOT DETECTED Final   Klebsiella pneumoniae NOT DETECTED NOT DETECTED Final   Proteus species NOT DETECTED NOT DETECTED Final   Salmonella species NOT DETECTED NOT DETECTED Final   Serratia marcescens NOT DETECTED NOT DETECTED Final   Haemophilus influenzae NOT DETECTED NOT DETECTED Final   Neisseria meningitidis NOT DETECTED NOT DETECTED Final   Pseudomonas aeruginosa NOT DETECTED NOT DETECTED Final   Stenotrophomonas maltophilia NOT DETECTED NOT DETECTED Final   Candida albicans NOT DETECTED NOT DETECTED Final   Candida auris NOT DETECTED NOT DETECTED Final   Candida glabrata NOT DETECTED NOT DETECTED Final   Candida krusei NOT DETECTED NOT DETECTED Final   Candida parapsilosis NOT DETECTED NOT DETECTED Final   Candida tropicalis NOT DETECTED NOT DETECTED Final   Cryptococcus neoformans/gattii NOT DETECTED NOT DETECTED Final   Vancomycin resistance NOT DETECTED NOT DETECTED Final    Comment: Performed at [facility], [address] [place], [place] [address]    Coagulation Studies: No results for input(s): LABPROT, INR in the last 72 hours.  Urinalysis: No results for input(s): COLORURINE, LABSPEC, PHURINE, GLUCOSEU, HGBUR, BILIRUBINUR, KETONESUR, PROTEINUR, UROBILINOGEN, NITRITE, LEUKOCYTESUR in the last 72 hours.  Invalid input(s): APPERANCEUR    Imaging: CT  ABDOMEN PELVIS WO CONTRAST  Result Date: 03/06/2021 CLINICAL DATA:  Chronic diarrhea and weight loss. Left-sided abdominal pain for 1 week. Poor kidney function. EXAM: CT ABDOMEN AND PELVIS WITHOUT CONTRAST TECHNIQUE: Multidetector CT imaging of the abdomen and pelvis was performed following the standard protocol without IV contrast. COMPARISON:  03/02/2021 FINDINGS: Lower chest: The lung bases are clear. The heart size is normal. The intracardiac blood pool is hypodense relative to the adjacent myocardium consistent with anemia. Hepatobiliary: The liver is normal. Normal gallbladder.There is no biliary ductal dilation. Pancreas: Normal contours without ductal dilatation. No peripancreatic fluid collection. Spleen: There  is a stable hypodense area in the splenic parenchyma towards the hilum. Adrenals/Urinary Tract: --Adrenal glands: Unremarkable. --Right kidney/ureter: No hydronephrosis or radiopaque kidney stones. --Left kidney/ureter: In the left perinephric space there is a new air and fluid collection abutting the lower pole of the left kidney. This collection measures approximately 5.9 cm across. This is likely a small perinephric hematoma in the setting of recent renal biopsy. --Urinary bladder: There is diffuse bladder wall thickening. Stomach/Bowel: --Stomach/Duodenum: No hiatal hernia or other gastric abnormality. Normal duodenal course and caliber. --Small bowel: There is mild wall thickening of several loops of small bowel in the mid left abdomen. --Colon: There appears to be mild wall thickening of the descending colon. --Appendix: Normal. Vascular/Lymphatic: Normal course and caliber of the major abdominal vessels. --No retroperitoneal lymphadenopathy. --No mesenteric lymphadenopathy. --No pelvic or inguinal lymphadenopathy. Reproductive: Unremarkable Other: There is a small volume of free fluid in the abdomen and pelvis. There is anasarca. Musculoskeletal. 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 IMPRESSION: 1. New collection about the left kidney favored to represent a small perinephric hematoma in the setting of recent renal biopsy. 2. Anemia. 3. Anasarca. 4. No specific abnormality identified to explain the patient's symptoms. Electronically Signed   By: Constance Holster M.D.   On: 03/06/2021 19:47   ECHO TEE  Result Date: 03/06/2021    TRANSESOPHOGEAL ECHO REPORT   Patient Name:   Jerome Mccormick Date of Exam: 03/06/2021 Medical Rec #:  [id number]      Height:       67.0 in Accession #:    [id number]     Weight:       132.0 lb Date of Birth:  1991-08-15      BSA:          1.695 m Patient Age:    30 years       BP:           121/73 mmHg Patient Gender: M              HR:           68 bpm. Exam Location:  [facility] Procedure: Transesophageal Echo, Color Doppler and Cardiac Doppler Indications:     Not listed on TEE check-in sheet  History:         Patient has prior history of Echocardiogram examinations, most                  recent 03/03/2021. Signs/Symptoms:Murmur.  Sonographer:     Sherrie Sport RDCS (AE) Referring Phys:  [id number] [place] Diagnosing Phys: Kate Sable MD PROCEDURE: The transesophogeal probe was passed without difficulty through the esophogus of the patient. Sedation performed by performing physician. The patient developed no complications during the procedure. IMPRESSIONS  1. Left ventricular ejection fraction, by estimation, is 60 to 65%. The left ventricle has normal function. The left ventricle has no regional wall motion abnormalities.  2. Right ventricular systolic function is normal. The right ventricular size is normal.  3. No left atrial/left atrial appendage thrombus was detected.  4. 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 The mitral valve is abnormal. Mild to moderate mitral valve regurgitation. 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  5. The aortic valve is  tricuspid. Aortic valve regurgitation is not visualized. 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 There is no clear evidence for endocarditis.  likely Etiology for ruptured chordae include mitral valve prolapse or endocarditis (since patient is bacteremic).  Recommend emperic treatment for endocarditis since patient is bacteremic. Surgical consult for mitral valve repair/replacement considerations can be obtained on outpatient basis (patient is hemodynamically stable and not in heart failure). FINDINGS  Left Ventricle: Left ventricular ejection fraction, by estimation, is 60 to 65%. The left ventricle has normal function. The left ventricle has no regional wall motion abnormalities. The left ventricular internal cavity size was normal in size. Right Ventricle: The right ventricular size is normal. No increase in right ventricular wall thickness. Right ventricular systolic function is normal. Left Atrium: Left atrial size was normal in size. No left atrial/left atrial appendage thrombus was detected. Right Atrium: Right atrial size was normal in size. Pericardium: There is no evidence of pericardial effusion. 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 The mitral valve is abnormal. 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 Mild to moderate mitral valve regurgitation. Tricuspid Valve: The tricuspid valve is normal in structure. Tricuspid valve regurgitation is trivial. Aortic Valve: The aortic valve is tricuspid. Aortic valve regurgitation is not visualized. Pulmonic Valve: The pulmonic valve was normal in structure. Pulmonic valve regurgitation is trivial. Aorta: The aortic root is normal in size and structure. Venous: The inferior vena cava was not well visualized. IAS/Shunts: No atrial level shunt detected by color flow Doppler. Kate Sable MD Electronically signed by Kate Sable  MD Signature Date/Time: 03/06/2021/5:00:13 PM    Final      Medications:   . sodium chloride    . ampicillin (OMNIPEN) IV Stopped (03/07/21 0523)  . cefTRIAXone (ROCEPHIN)  IV 2 g (03/07/21 0957)  . methocarbamol (ROBAXIN) IV     . folic acid  1 mg Oral Daily  . multivitamin with minerals  1 tablet Oral Daily  . nicotine  14 mg Transdermal Daily  . thiamine  100 mg Oral Daily   Or  . thiamine  100 mg Intravenous Daily   sodium chloride, acetaminophen **OR** acetaminophen, methocarbamol (ROBAXIN) IV, ondansetron **OR** ondansetron (ZOFRAN) IV, polyethylene glycol, traZODone  Assessment/ Plan:  30 y.o. male with  was admitted on 03/02/2021 for  Principal Problem:   AKI (acute kidney injury) ([place]) Active Problems:   Anemia   Cardiac murmur   Nonrheumatic mitral valve regurgitation  AKI (acute kidney injury) ([place]) [N17.9] Acute kidney injury ([place]) [N17.9]  #. Acute kidney injury. 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  Mostly neutrophilic infiltrate.  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 ASO titer is normal.  Influenza, SARS coronavirus, hepatitis B studies, hepatitis C studies, HIV are negative.    C3 low at 51  Blood culture Positive for enterococcus faecalis  - ID consulted. Appreciate recs - no acute need for dialysis at this time  #. Anemia, iron defiency   Lab Results  Component Value Date   HGB 8.3 (L) 03/07/2021   Will continue to monitor  #. Hematuria - reports clear yellow urine   # Hyperkalemia -  Lab Results  Component Value Date   K 4.7 03/07/2021    - corrected   LOS: [place] 4/7/20222:51 PM  [place], [place]  [phone]  Patient was seen and evaluated with Colon Flattery, NP.  Plan of care was discussed with patient as well as NP.  I agree with the note as documented with edits.

## 2021-03-07 NOTE — Telephone Encounter (Signed)
-----   Message from Antonieta Iba, MD sent at 03/07/2021  3:48 PM EDT ----- Regarding: Referral to cardiothoracic surgery in [facility] Currently in the hospital with suspected endocarditis, flail mitral valve leaflet Can we place a outpatient office referral (expedited) for consultation of his mitral valve disease, Ideally may be best to meet with Dr. Cornelius Moras or Laneta Simmers who do the minimally invasive mitral valve surgery Ideally if we could set him up in the next week or 2 in office We will likely be on IV antibiotics Thx TG

## 2021-03-08 ENCOUNTER — Encounter: Payer: Self-pay | Admitting: Cardiology

## 2021-03-08 DIAGNOSIS — I059 Rheumatic mitral valve disease, unspecified: Secondary | ICD-10-CM

## 2021-03-08 LAB — RENAL FUNCTION PANEL
Albumin: 2.3 g/dL — ABNORMAL LOW (ref 3.5–5.0)
Anion gap: 9 (ref 5–15)
BUN: 76 mg/dL — ABNORMAL HIGH (ref 6–20)
CO2: 23 mmol/L (ref 22–32)
Calcium: 8.1 mg/dL — ABNORMAL LOW (ref 8.9–10.3)
Chloride: 105 mmol/L (ref 98–111)
Creatinine, Ser: 3.7 mg/dL — ABNORMAL HIGH (ref 0.61–1.24)
GFR, Estimated: 22 mL/min — ABNORMAL LOW (ref >60)
Glucose, Bld: 102 mg/dL — ABNORMAL HIGH (ref 70–99)
Phosphorus: 6.5 mg/dL — ABNORMAL HIGH (ref 2.5–4.6)
Potassium: 4.9 mmol/L (ref 3.5–5.1)
Sodium: 137 mmol/L (ref 135–145)

## 2021-03-08 LAB — CBC
HCT: 25.2 % — ABNORMAL LOW (ref 39.0–52.0)
Hemoglobin: 8.4 g/dL — ABNORMAL LOW (ref 13.0–17.0)
MCH: 28.6 pg (ref 26.0–34.0)
MCHC: 33.3 g/dL (ref 30.0–36.0)
MCV: 85.7 fL (ref 80.0–100.0)
Platelets: 309 10*3/uL (ref 150–400)
RBC: 2.94 MIL/uL — ABNORMAL LOW (ref 4.22–5.81)
RDW: 13.5 % (ref 11.5–15.5)
WBC: 7.9 10*3/uL (ref 4.0–10.5)
nRBC: 0 % (ref 0.0–0.2)

## 2021-03-08 LAB — CULTURE, BLOOD (ROUTINE X 2)
Special Requests: ADEQUATE
Special Requests: ADEQUATE

## 2021-03-08 LAB — KAPPA/LAMBDA LIGHT CHAINS
Kappa free light chain: 150.9 mg/L — ABNORMAL HIGH (ref 3.3–19.4)
Kappa, lambda light chain ratio: 2.1 — ABNORMAL HIGH (ref 0.26–1.65)
Lambda free light chains: 71.8 mg/L — ABNORMAL HIGH (ref 5.7–26.3)

## 2021-03-08 LAB — MAGNESIUM: Magnesium: 2.8 mg/dL — ABNORMAL HIGH (ref 1.7–2.4)

## 2021-03-08 MED ORDER — MELATONIN 5 MG PO TABS
5.0000 mg | ORAL_TABLET | Freq: Every evening | ORAL | Status: DC | PRN
  Administered 2021-03-08 – 2021-03-10 (×3): 5 mg via ORAL
  Filled 2021-03-08 (×3): qty 1

## 2021-03-08 NOTE — Progress Notes (Signed)
[facility] CLINIC INFECTIOUS DISEASE PROGRESS NOTE Date of Admission:  03/02/2021     ID: Jerome Mccormick is a 30 y.o. male with ARF. Principal Problem:   AKI (acute kidney injury) (HCC) Active Problems:   Anemia   Cardiac murmur   Nonrheumatic mitral valve regurgitation   Subjective: No fevers.  White count 7.7.  Follow-up blood culturepending.  TEE with mod MR and flail/ruptured posterior chordae. Being referred for CT surg eval  ROS  Eleven systems are reviewed and negative except per hpi  Medications:  Antibiotics Given (last 72 hours)    Date/Time Action Medication Dose Rate   03/06/21 1415 New Bag/Given   ampicillin (OMNIPEN) 2 g in sodium chloride 0.9 % 100 mL IVPB 2 g 300 mL/hr   03/06/21 2027 New Bag/Given   cefTRIAXone (ROCEPHIN) 2 g in sodium chloride 0.9 % 100 mL IVPB 2 g 200 mL/hr   03/06/21 2113 New Bag/Given   ampicillin (OMNIPEN) 2 g in sodium chloride 0.9 % 100 mL IVPB 2 g 300 mL/hr   03/07/21 0503 New Bag/Given   ampicillin (OMNIPEN) 2 g in sodium chloride 0.9 % 100 mL IVPB 2 g 300 mL/hr   03/07/21 0957 New Bag/Given   cefTRIAXone (ROCEPHIN) 2 g in sodium chloride 0.9 % 100 mL IVPB 2 g 200 mL/hr   03/07/21 1526 New Bag/Given   ampicillin (OMNIPEN) 2 g in sodium chloride 0.9 % 100 mL IVPB 2 g 300 mL/hr   03/07/21 2133 New Bag/Given   ampicillin (OMNIPEN) 2 g in sodium chloride 0.9 % 100 mL IVPB 2 g 300 mL/hr   03/07/21 2213 New Bag/Given   cefTRIAXone (ROCEPHIN) 2 g in sodium chloride 0.9 % 100 mL IVPB 2 g 200 mL/hr   03/08/21 0447 New Bag/Given   ampicillin (OMNIPEN) 2 g in sodium chloride 0.9 % 100 mL IVPB 2 g 300 mL/hr   03/08/21 0838 New Bag/Given   cefTRIAXone (ROCEPHIN) 2 g in sodium chloride 0.9 % 100 mL IVPB 2 g 200 mL/hr     . folic acid  1 mg Oral Daily  . multivitamin with minerals  1 tablet Oral Daily  . nicotine  14 mg Transdermal Daily  . thiamine  100 mg Oral Daily   Or  . thiamine  100 mg Intravenous Daily    Objective: Vital signs in  last 24 hours: Temp:  [97.7 F (36.5 C)-98.5 F (36.9 C)] 98.4 F (36.9 C) (04/08 0759) Pulse Rate:  [72-85] 76 (04/08 0759) Resp:  [16-20] 20 (04/08 0759) BP: (120-127)/(73-89) 127/77 (04/08 0759) SpO2:  [98 %-100 %] 98 % (04/08 0759) Constitutional: He is oriented to person, place, and time. He appears well-developed and well-nourished. No distress.  HENT: anicteric, Mouth/Throat: Oropharynx is clear and moist. No oropharyngeal exudate.  Cardiovascular: Normal rate, regular rhythm and normal heart sounds. 3/6 SM Pulmonary/Chest: Effort normal and breath sounds normal. No respiratory distress. He has no wheezes.  Abdominal: Soft. Bowel sounds are normal. He exhibits no distension. There is no tenderness.  Lymphadenopathy: He has no cervical adenopathy.  Neurological: He is alert and oriented to person, place, and time.  Skin: Skin is warm and dry. No rash noted. No erythema. No stigmata of endocarditis.  Psychiatric: He has a normal mood and affect. His behavior is normal.   Lab Results Recent Labs    03/07/21 0631 03/08/21 0425  WBC 7.7 7.9  HGB 8.3* 8.4*  HCT 25.8* 25.2*  NA 132* 137  K 4.7 4.9  CL  101 105  CO2 22 23  BUN 80* 76*  CREATININE 4.18* 3.70*    Microbiology: Results for orders placed or performed during the hospital encounter of 03/02/21  Resp Panel by RT-PCR (Flu A&B, Covid) Nasopharyngeal Swab     Status: None   Collection Time: 03/02/21  4:31 PM   Specimen: Nasopharyngeal Swab; Nasopharyngeal(NP) swabs in vial transport medium  Result Value Ref Range Status   SARS Coronavirus 2 by RT PCR NEGATIVE NEGATIVE Final    Comment: (NOTE) SARS-CoV-2 target nucleic acids are NOT DETECTED.  The SARS-CoV-2 RNA is generally detectable in upper respiratory specimens during the acute phase of infection. The lowest concentration of SARS-CoV-2 viral copies this assay can detect is 138 copies/mL. A negative result does not preclude SARS-Cov-2 infection and should not  be used as the sole basis for treatment or other patient management decisions. A negative result may occur with  improper specimen collection/handling, submission of specimen other than nasopharyngeal swab, presence of viral mutation(s) within the areas targeted by this assay, and inadequate number of viral copies(<138 copies/mL). A negative result must be combined with clinical observations, patient history, and epidemiological information. The expected result is Negative.  Fact Sheet for Patients:  BloggerCourse.comhttps://www.fda.gov/media/152166/download  Fact Sheet for Healthcare Providers:  SeriousBroker.ithttps://www.fda.gov/media/152162/download  This test is no t yet approved or cleared by the [place] FDA and  has been authorized for detection and/or diagnosis of SARS-CoV-2 by FDA under an Emergency Use Authorization (EUA). This EUA will remain  in effect (meaning this test can be used) for the duration of the COVID-19 declaration under Section 564(b)(1) of the Act, 21 U.S.C.section 360bbb-3(b)(1), unless the authorization is terminated  or revoked sooner.       Influenza A by PCR NEGATIVE NEGATIVE Final   Influenza B by PCR NEGATIVE NEGATIVE Final    Comment: (NOTE) The Xpert Xpress SARS-CoV-2/FLU/RSV plus assay is intended as an aid in the diagnosis of influenza from Nasopharyngeal swab specimens and should not be used as a sole basis for treatment. Nasal washings and aspirates are unacceptable for Xpert Xpress SARS-CoV-2/FLU/RSV testing.  Fact Sheet for Patients: BloggerCourse.comhttps://www.fda.gov/media/152166/download  Fact Sheet for Healthcare Providers: SeriousBroker.ithttps://www.fda.gov/media/152162/download  This test is not yet approved or cleared by the [place] FDA and has been authorized for detection and/or diagnosis of SARS-CoV-2 by FDA under an Emergency Use Authorization (EUA). This EUA will remain in effect (meaning this test can be used) for the duration of the COVID-19 declaration under Section  564(b)(1) of the Act, 21 U.S.C. section 360bbb-3(b)(1), unless the authorization is terminated or revoked.  Performed at [facility], [address] [place], [place] [address]   Group A Strep by PCR     Status: None   Collection Time: 03/05/21  4:46 PM   Specimen: Throat; Sterile Swab  Result Value Ref Range Status   Group A Strep by PCR NOT DETECTED NOT DETECTED Final    Comment: Performed at [facility], [address] [place], [place] [address]  Culture, blood (Routine X 2) w Reflex to ID Panel     Status: Abnormal   Collection Time: 03/05/21  5:46 PM   Specimen: BLOOD  Result Value Ref Range Status   Specimen Description   Final    BLOOD LEFT ANTECUBITAL Performed at [facility], [address] [place], [place] [address]    Special Requests   Final    BOTTLES DRAWN AEROBIC AND ANAEROBIC Blood Culture adequate volume Performed at [facility], [address] [place], [place] [address]    Culture  Setup Time   Final    GRAM POSITIVE COCCI IN BOTH AEROBIC AND ANAEROBIC BOTTLES CRITICAL VALUE NOTED.  VALUE IS CONSISTENT WITH PREVIOUSLY REPORTED AND CALLED VALUE. Performed at [facility], [address], [place], [place] [address]    Culture (A)  Final    ENTEROCOCCUS FAECALIS SUSCEPTIBILITIES PERFORMED ON PREVIOUS CULTURE WITHIN THE LAST 5 DAYS. Performed at [facility], [address] [place], [place] [address]    Report Status 03/08/2021 FINAL  Final  Culture, blood (Routine X 2) w Reflex to ID Panel     Status: Abnormal   Collection Time: 03/05/21  5:50 PM   Specimen: BLOOD  Result Value Ref Range Status   Specimen Description   Final    BLOOD RIGHT ANTECUBITAL Performed at [facility], [address], [place], [place] [address]    Special Requests   Final    BOTTLES DRAWN AEROBIC AND ANAEROBIC Blood Culture adequate volume Performed at [facility], [address]  [place], [place] [address]    Culture  Setup Time   Final    Organism ID to follow GRAM POSITIVE COCCI IN BOTH AEROBIC AND ANAEROBIC BOTTLES CRITICAL RESULT CALLED TO, READ BACK BY AND VERIFIED WITH: SUSAN WATSON AT 1146 03/06/21 SDR Performed at [facility], [address] [place], [place] [address]    Culture ENTEROCOCCUS FAECALIS (A)  Final   Report Status 03/08/2021 FINAL  Final   Organism ID, Bacteria ENTEROCOCCUS FAECALIS  Final      Susceptibility   Enterococcus faecalis - MIC*    AMPICILLIN <=2 SENSITIVE Sensitive     VANCOMYCIN 1 SENSITIVE Sensitive     GENTAMICIN SYNERGY SENSITIVE Sensitive     * ENTEROCOCCUS FAECALIS  Blood Culture ID Panel (Reflexed)     Status: Abnormal   Collection Time: 03/05/21  5:50 PM  Result Value Ref Range Status   Enterococcus faecalis DETECTED (A) NOT DETECTED Final    Comment: CRITICAL RESULT CALLED TO, READ BACK BY AND VERIFIED WITH:  SUSAN WATSON AT 1146 03/06/21 SDR    Enterococcus Faecium NOT DETECTED NOT DETECTED Final   Listeria monocytogenes NOT DETECTED NOT DETECTED Final   Staphylococcus species NOT DETECTED NOT DETECTED Final   Staphylococcus aureus (BCID) NOT DETECTED NOT DETECTED Final   Staphylococcus epidermidis NOT DETECTED NOT DETECTED Final   Staphylococcus lugdunensis NOT DETECTED NOT DETECTED Final   Streptococcus species NOT DETECTED NOT DETECTED Final   Streptococcus agalactiae NOT DETECTED NOT DETECTED Final   Streptococcus pneumoniae NOT DETECTED NOT DETECTED Final   Streptococcus pyogenes NOT DETECTED NOT DETECTED Final   A.calcoaceticus-baumannii NOT DETECTED NOT DETECTED Final   Bacteroides fragilis NOT DETECTED NOT DETECTED Final   Enterobacterales NOT DETECTED NOT DETECTED Final   Enterobacter cloacae complex NOT DETECTED NOT DETECTED Final   Escherichia coli NOT DETECTED NOT DETECTED Final   Klebsiella aerogenes NOT DETECTED NOT DETECTED Final   Klebsiella oxytoca NOT DETECTED NOT DETECTED Final    Klebsiella pneumoniae NOT DETECTED NOT DETECTED Final   Proteus species NOT DETECTED NOT DETECTED Final   Salmonella species NOT DETECTED NOT DETECTED Final   Serratia marcescens NOT DETECTED NOT DETECTED Final   Haemophilus influenzae NOT DETECTED NOT DETECTED Final   Neisseria meningitidis NOT DETECTED NOT DETECTED Final   Pseudomonas aeruginosa NOT DETECTED NOT DETECTED Final   Stenotrophomonas maltophilia NOT DETECTED NOT DETECTED Final   Candida  albicans NOT DETECTED NOT DETECTED Final   Candida auris NOT DETECTED NOT DETECTED Final   Candida glabrata NOT DETECTED NOT DETECTED Final   Candida krusei NOT DETECTED NOT DETECTED Final   Candida parapsilosis NOT DETECTED NOT DETECTED Final   Candida tropicalis NOT DETECTED NOT DETECTED Final   Cryptococcus neoformans/gattii NOT DETECTED NOT DETECTED Final   Vancomycin resistance NOT DETECTED NOT DETECTED Final    Comment: Performed at [facility], [address] [place], [place] [address]  Culture, blood (single) w Reflex to ID Panel     Status: None (Preliminary result)   Collection Time: 03/07/21  6:31 AM   Specimen: BLOOD  Result Value Ref Range Status   Specimen Description BLOOD  LEFT AC  Final   Special Requests   Final    BOTTLES DRAWN AEROBIC AND ANAEROBIC Blood Culture results may not be optimal due to an excessive volume of blood received in culture bottles   Culture   Final    NO GROWTH < 24 HOURS Performed at [facility], [address] [place], [place] [address]    Report Status PENDING  Incomplete    Studies/Results: CT ABDOMEN PELVIS WO CONTRAST  Result Date: 03/06/2021 CLINICAL DATA:  Chronic diarrhea and weight loss. Left-sided abdominal pain for 1 week. Poor kidney function. EXAM: CT ABDOMEN AND PELVIS WITHOUT CONTRAST TECHNIQUE: Multidetector CT imaging of the abdomen and pelvis was performed following the standard protocol without IV contrast. COMPARISON:  03/02/2021 FINDINGS: Lower chest:  The lung bases are clear. The heart size is normal. The intracardiac blood pool is hypodense relative to the adjacent myocardium consistent with anemia. Hepatobiliary: The liver is normal. Normal gallbladder.There is no biliary ductal dilation. Pancreas: Normal contours without ductal dilatation. No peripancreatic fluid collection. Spleen: There is a stable hypodense area in the splenic parenchyma towards the hilum. Adrenals/Urinary Tract: --Adrenal glands: Unremarkable. --Right kidney/ureter: No hydronephrosis or radiopaque kidney stones. --Left kidney/ureter: In the left perinephric space there is a new air and fluid collection abutting the lower pole of the left kidney. This collection measures approximately 5.9 cm across. This is likely a small perinephric hematoma in the setting of recent renal biopsy. --Urinary bladder: There is diffuse bladder wall thickening. Stomach/Bowel: --Stomach/Duodenum: No hiatal hernia or other gastric abnormality. Normal duodenal course and caliber. --Small bowel: There is mild wall thickening of several loops of small bowel in the mid left abdomen. --Colon: There appears to be mild wall thickening of the descending colon. --Appendix: Normal. Vascular/Lymphatic: Normal course and caliber of the major abdominal vessels. --No retroperitoneal lymphadenopathy. --No mesenteric lymphadenopathy. --No pelvic or inguinal lymphadenopathy. Reproductive: Unremarkable Other: There is a small volume of free fluid in the abdomen and pelvis. There is anasarca. Musculoskeletal. 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 IMPRESSION: 1. New collection about the left kidney favored to represent a small perinephric hematoma in the setting of recent renal biopsy. 2. Anemia. 3. Anasarca. 4. No specific abnormality identified to explain the patient's symptoms. Electronically Signed   By: Katherine Mantle M.D.   On: 03/06/2021 19:47    Assessment/Plan: L  Even is a 30 y.o. 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  Dorna Bloom so far negative for etiology of glomerulonephritis. He is HIV neg.  He did have prodromal sxs of fevers and chills for  several weeks and I am concerned for SBE with associated glomerulonephritis. Strongly denies any IVDU so if has SBE possible oral source.  He did not have bcx done on admit and received IV abx in ED.  03/06/21- bcx + 2/2 enterococcus. TEE with flail leaflet He tells me he has issues with chronic nausea, diarrhea, wt loss. Has had blood in his stool in past. Would consider wu for GI issues such as crohns or UC  4/8 doing well. 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 Being referred for CT surg eval otpt Hold on picc line until cxs cleared. If bcx is neg from 4/7 at 48 hours can place  Would suggest GI fu otpt for colonoscopy. FU me in 1-2 weeks after dc Thank you very much for the consult. Will follow with you.  Mick Sell   03/08/2021, 11:25 AM

## 2021-03-08 NOTE — Progress Notes (Addendum)
[place], [place] 03/08/21  Subjective:   LOS: 6  Jerome Mccormick is a 30 y.o. male with an extensive history of NSAID use and alcohol use. He presents to ED with 7 days of headaches and congestion. He is found to increased Creatinine 4.31.  Patient seen laying in bed Alert and oriented States his appetite has decreased Says he is ready to discharge   Objective:  Vital signs in last 24 hours:  Temp:  [97.7 F (36.5 C)-98.5 F (36.9 C)] 98.4 F (36.9 C) (04/08 0759) Pulse Rate:  [72-85] 76 (04/08 0759) Resp:  [16-20] 20 (04/08 0759) BP: (120-127)/(73-89) 127/77 (04/08 0759) SpO2:  [98 %-100 %] 98 % (04/08 0759)  Weight change:  Filed Weights   03/02/21 1533 03/06/21 0859  Weight: 62.6 kg 59.9 kg    Intake/Output:    Intake/Output Summary (Last 24 hours) at 03/08/2021 1034 Last data filed at 03/08/2021 1015 Gross per 24 hour  Intake 1360 ml  Output --  Net 1360 ml     Physical Exam: General: NAD, resting in bed, anxious  HEENT Anicteric, moist oral mucosa  Pulm/lungs Clear bilaterally, normal breathing pattern  CVS/Heart S1S2 present, regular rate  Abdomen:  Soft, non tender  Extremities: No peripheral edema  Neurologic: Alert, oriented, able to answer questions  Skin: No rashes or masses          Basic Metabolic Panel:  Recent Labs  Lab 03/04/21 0633 03/05/21 0454 03/06/21 0444 03/07/21 0631 03/08/21 0425  NA 133* 132* 133* 132* 137  K 4.2 4.8 5.9* 4.7 4.9  CL 103 103 102 101 105  CO2 21* '23 23 22 23  ' GLUCOSE 106* 108* 143* 111* 102*  BUN 73* 72* 79* 80* 76*  CREATININE 4.35* 4.22* 5.00* 4.18* 3.70*  CALCIUM 7.4* 7.7* 8.4* 7.8* 8.1*  MG  --   --   --  3.1* 2.8*  PHOS 5.6* 5.7* 7.7* 6.4* 6.5*     CBC: Recent Labs  Lab 03/02/21 1631 03/03/21 0552 03/04/21 0633 03/06/21 0444 03/07/21 0631 03/08/21 0425  WBC 7.6 9.5 7.0 6.3 7.7 7.9  NEUTROABS 5.7  --   --   --   --   --   HGB 8.8* 9.2* 9.0* 9.9* 8.3* 8.4*   HCT 27.5* 28.6* 28.0* 31.1* 25.8* 25.2*  MCV 88.1 86.9 87.5 85.9 86.3 85.7  PLT 309 319 295 287 292 309      Lab Results  Component Value Date   HEPBSAG NON REACTIVE 03/02/2021   HEPBSAB NON REACTIVE 03/02/2021      Microbiology:  Recent Results (from the past 240 hour(s))  Resp Panel by RT-PCR (Flu A&B, Covid) Nasopharyngeal Swab     Status: None   Collection Time: 03/02/21  4:31 PM   Specimen: Nasopharyngeal Swab; Nasopharyngeal(NP) swabs in vial transport medium  Result Value Ref Range Status   SARS Coronavirus 2 by RT PCR NEGATIVE NEGATIVE Final    Comment: (NOTE) SARS-CoV-2 target nucleic acids are NOT DETECTED.  The SARS-CoV-2 RNA is generally detectable in upper respiratory specimens during the acute phase of infection. The lowest concentration of SARS-CoV-2 viral copies this assay can detect is 138 copies/mL. A negative result does not preclude SARS-Cov-2 infection and should not be used as the sole basis for treatment or other patient management decisions. A negative result may occur with  improper specimen collection/handling, submission of specimen other than nasopharyngeal swab, presence of viral mutation(s) within the areas targeted by this assay, and  inadequate number of viral copies(<138 copies/mL). A negative result must be combined with clinical observations, patient history, and epidemiological information. The expected result is Negative.  Fact Sheet for Patients:  EntrepreneurPulse.com.au  Fact Sheet for Healthcare Providers:  IncredibleEmployment.be  This test is no t yet approved or cleared by the [place] FDA and  has been authorized for detection and/or diagnosis of SARS-CoV-2 by FDA under an Emergency Use Authorization (EUA). This EUA will remain  in effect (meaning this test can be used) for the duration of the COVID-19 declaration under Section 564(b)(1) of the Act, 21 U.S.C.section 360bbb-3(b)(1),  unless the authorization is terminated  or revoked sooner.       Influenza A by PCR NEGATIVE NEGATIVE Final   Influenza B by PCR NEGATIVE NEGATIVE Final    Comment: (NOTE) The Xpert Xpress SARS-CoV-2/FLU/RSV plus assay is intended as an aid in the diagnosis of influenza from Nasopharyngeal swab specimens and should not be used as a sole basis for treatment. Nasal washings and aspirates are unacceptable for Xpert Xpress SARS-CoV-2/FLU/RSV testing.  Fact Sheet for Patients: EntrepreneurPulse.com.au  Fact Sheet for Healthcare Providers: IncredibleEmployment.be  This test is not yet approved or cleared by the [place] FDA and has been authorized for detection and/or diagnosis of SARS-CoV-2 by FDA under an Emergency Use Authorization (EUA). This EUA will remain in effect (meaning this test can be used) for the duration of the COVID-19 declaration under Section 564(b)(1) of the Act, 21 U.S.C. section 360bbb-3(b)(1), unless the authorization is terminated or revoked.  Performed at [facility], [address] [place], [place] [address]   Group A Strep by PCR     Status: None   Collection Time: 03/05/21  4:46 PM   Specimen: Throat; Sterile Swab  Result Value Ref Range Status   Group A Strep by PCR NOT DETECTED NOT DETECTED Final    Comment: Performed at [facility], [address] [place], [place] [address]  Culture, blood (Routine X 2) w Reflex to ID Panel     Status: Abnormal   Collection Time: 03/05/21  5:46 PM   Specimen: BLOOD  Result Value Ref Range Status   Specimen Description   Final    BLOOD LEFT ANTECUBITAL Performed at [facility], [address], [place], [place] [address]    Special Requests   Final    BOTTLES DRAWN AEROBIC AND ANAEROBIC Blood Culture adequate volume Performed at [facility], [address] [place], [place] [address]    Culture  Setup Time   Final    GRAM  POSITIVE COCCI IN BOTH AEROBIC AND ANAEROBIC BOTTLES CRITICAL VALUE NOTED.  VALUE IS CONSISTENT WITH PREVIOUSLY REPORTED AND CALLED VALUE. Performed at [facility], [address] [place], [place] [address]    Culture (A)  Final    ENTEROCOCCUS FAECALIS SUSCEPTIBILITIES PERFORMED ON PREVIOUS CULTURE WITHIN THE LAST 5 DAYS. Performed at [facility], [address] [place], [place] [address]    Report Status 03/08/2021 FINAL  Final  Culture, blood (Routine X 2) w Reflex to ID Panel     Status: Abnormal   Collection Time: 03/05/21  5:50 PM   Specimen: BLOOD  Result Value Ref Range Status   Specimen Description   Final    BLOOD RIGHT ANTECUBITAL Performed at [facility], [address], [place], [place] [address]    Special Requests   Final    BOTTLES DRAWN AEROBIC AND ANAEROBIC Blood Culture adequate  volume Performed at [facility], [address] [place], [place] [address]    Culture  Setup Time   Final    Organism ID to follow GRAM POSITIVE COCCI IN BOTH AEROBIC AND ANAEROBIC BOTTLES CRITICAL RESULT CALLED TO, READ BACK BY AND VERIFIED WITH: SUSAN WATSON AT [place] 03/06/21 [place] Performed at [facility], [address] [place], [place] [address]    Culture ENTEROCOCCUS FAECALIS (A)  Final   Report Status 03/08/2021 FINAL  Final   Organism ID, Bacteria ENTEROCOCCUS FAECALIS  Final      Susceptibility   Enterococcus faecalis - MIC*    AMPICILLIN <=2 SENSITIVE Sensitive     VANCOMYCIN 1 SENSITIVE Sensitive     GENTAMICIN SYNERGY SENSITIVE Sensitive     * ENTEROCOCCUS FAECALIS  Blood Culture ID Panel (Reflexed)     Status: Abnormal   Collection Time: 03/05/21  5:50 PM  Result Value Ref Range Status   Enterococcus faecalis DETECTED (A) NOT DETECTED Final    Comment: CRITICAL RESULT CALLED TO, READ BACK BY AND VERIFIED WITH:  SUSAN WATSON AT 1146 03/06/21 SDR    Enterococcus Faecium NOT DETECTED NOT DETECTED Final   Listeria  monocytogenes NOT DETECTED NOT DETECTED Final   Staphylococcus species NOT DETECTED NOT DETECTED Final   Staphylococcus aureus (BCID) NOT DETECTED NOT DETECTED Final   Staphylococcus epidermidis NOT DETECTED NOT DETECTED Final   Staphylococcus lugdunensis NOT DETECTED NOT DETECTED Final   Streptococcus species NOT DETECTED NOT DETECTED Final   Streptococcus agalactiae NOT DETECTED NOT DETECTED Final   Streptococcus pneumoniae NOT DETECTED NOT DETECTED Final   Streptococcus pyogenes NOT DETECTED NOT DETECTED Final   A.calcoaceticus-baumannii NOT DETECTED NOT DETECTED Final   Bacteroides fragilis NOT DETECTED NOT DETECTED Final   Enterobacterales NOT DETECTED NOT DETECTED Final   Enterobacter cloacae complex NOT DETECTED NOT DETECTED Final   Escherichia coli NOT DETECTED NOT DETECTED Final   Klebsiella aerogenes NOT DETECTED NOT DETECTED Final   Klebsiella oxytoca NOT DETECTED NOT DETECTED Final   Klebsiella pneumoniae NOT DETECTED NOT DETECTED Final   Proteus species NOT DETECTED NOT DETECTED Final   Salmonella species NOT DETECTED NOT DETECTED Final   Serratia marcescens NOT DETECTED NOT DETECTED Final   Haemophilus influenzae NOT DETECTED NOT DETECTED Final   Neisseria meningitidis NOT DETECTED NOT DETECTED Final   Pseudomonas aeruginosa NOT DETECTED NOT DETECTED Final   Stenotrophomonas maltophilia NOT DETECTED NOT DETECTED Final   Candida albicans NOT DETECTED NOT DETECTED Final   Candida auris NOT DETECTED NOT DETECTED Final   Candida glabrata NOT DETECTED NOT DETECTED Final   Candida krusei NOT DETECTED NOT DETECTED Final   Candida parapsilosis NOT DETECTED NOT DETECTED Final   Candida tropicalis NOT DETECTED NOT DETECTED Final   Cryptococcus neoformans/gattii NOT DETECTED NOT DETECTED Final   Vancomycin resistance NOT DETECTED NOT DETECTED Final    Comment: Performed at [facility], [address] [place], [place] [address]  Culture, blood (single) w Reflex to ID  Panel     Status: None (Preliminary result)   Collection Time: 03/07/21  6:31 AM   Specimen: BLOOD  Result Value Ref Range Status   Specimen Description BLOOD  LEFT AC  Final   Special Requests   Final    BOTTLES DRAWN AEROBIC AND ANAEROBIC Blood Culture results may not be optimal due to an excessive volume of blood received in culture bottles   Culture   Final    NO GROWTH < 24 HOURS Performed  at [facility], [address] [place], [place] [address]    Report Status PENDING  Incomplete    Coagulation Studies: No results for input(s): LABPROT, INR in the last 72 hours.  Urinalysis: No results for input(s): COLORURINE, LABSPEC, PHURINE, GLUCOSEU, HGBUR, BILIRUBINUR, KETONESUR, PROTEINUR, UROBILINOGEN, NITRITE, LEUKOCYTESUR in the last 72 hours.  Invalid input(s): APPERANCEUR    Imaging: CT ABDOMEN PELVIS WO CONTRAST  Result Date: 03/06/2021 CLINICAL DATA:  Chronic diarrhea and weight loss. Left-sided abdominal pain for 1 week. Poor kidney function. EXAM: CT ABDOMEN AND PELVIS WITHOUT CONTRAST TECHNIQUE: Multidetector CT imaging of the abdomen and pelvis was performed following the standard protocol without IV contrast. COMPARISON:  03/02/2021 FINDINGS: Lower chest: The lung bases are clear. The heart size is normal. The intracardiac blood pool is hypodense relative to the adjacent myocardium consistent with anemia. Hepatobiliary: The liver is normal. Normal gallbladder.There is no biliary ductal dilation. Pancreas: Normal contours without ductal dilatation. No peripancreatic fluid collection. Spleen: There is a stable hypodense area in the splenic parenchyma towards the hilum. Adrenals/Urinary Tract: --Adrenal glands: Unremarkable. --Right kidney/ureter: No hydronephrosis or radiopaque kidney stones. --Left kidney/ureter: In the left perinephric space there is a new air and fluid collection abutting the lower pole of the left kidney. This collection measures approximately 5.9 cm  across. This is likely a small perinephric hematoma in the setting of recent renal biopsy. --Urinary bladder: There is diffuse bladder wall thickening. Stomach/Bowel: --Stomach/Duodenum: No hiatal hernia or other gastric abnormality. Normal duodenal course and caliber. --Small bowel: There is mild wall thickening of several loops of small bowel in the mid left abdomen. --Colon: There appears to be mild wall thickening of the descending colon. --Appendix: Normal. Vascular/Lymphatic: Normal course and caliber of the major abdominal vessels. --No retroperitoneal lymphadenopathy. --No mesenteric lymphadenopathy. --No pelvic or inguinal lymphadenopathy. Reproductive: Unremarkable Other: There is a small volume of free fluid in the abdomen and pelvis. There is anasarca. Musculoskeletal. 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 IMPRESSION: 1. New collection about the left kidney favored to represent a small perinephric hematoma in the setting of recent renal biopsy. 2. Anemia. 3. Anasarca. 4. No specific abnormality identified to explain the patient's symptoms. Electronically Signed   By: Constance Holster M.D.   On: 03/06/2021 19:47     Medications:   . sodium chloride    . ampicillin (OMNIPEN) IV 2 g (03/08/21 0447)  . cefTRIAXone (ROCEPHIN)  IV 2 g (03/08/21 6045)  . methocarbamol (ROBAXIN) IV     . folic acid  1 mg Oral Daily  . multivitamin with minerals  1 tablet Oral Daily  . nicotine  14 mg Transdermal Daily  . thiamine  100 mg Oral Daily   Or  . thiamine  100 mg Intravenous Daily   sodium chloride, acetaminophen **OR** acetaminophen, methocarbamol (ROBAXIN) IV, ondansetron **OR** ondansetron (ZOFRAN) IV, polyethylene glycol, traZODone  Assessment/ Plan:  30 y.o. male with  was admitted on 03/02/2021 for  Principal Problem:   AKI (acute kidney injury) ([place]) Active Problems:   Anemia   Cardiac murmur   Nonrheumatic mitral valve  regurgitation  AKI (acute kidney injury) ([place]) [N17.9] Acute kidney injury ([place]) [N17.9]  #. Acute kidney injury. 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  Mostly neutrophilic infiltrate.  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 ASO titer is normal.  Influenza, SARS coronavirus, hepatitis B studies, hepatitis C studies, HIV are negative.  Blood culture Positive for enterococcus faecalis  - ID consulted. Appreciate recs - no acute need for dialysis at this time - will require PICC and antibiotics for 6 weeks. - awaiting negative blood cultures to place picc  #. Anemia, iron defiency   Lab Results  Component Value Date   HGB 8.4 (L) 03/08/2021   Will continue to monitor  #. Hematuria - resolved   # Hyperkalemia -  Lab Results  Component Value Date   K 4.9 03/08/2021    - corrected   LOS: [place] 4/8/202210:34 AM  [place], [place]  Patient was seen and evaluated with Colon Flattery, NP.  Plan of care was discussed with patient as well as NP.  I agree with the note as documented with edits.

## 2021-03-08 NOTE — Progress Notes (Signed)
Progress Note  Patient Name: Jerome Mccormick Date of Encounter: 03/08/2021  Primary Cardiologist: Debbe Odea, MD  Subjective   No chest pain or dyspnea.  Nauseated after several IV sticks.  Inpatient Medications    Scheduled Meds: . folic acid  1 mg Oral Daily  . multivitamin with minerals  1 tablet Oral Daily  . nicotine  14 mg Transdermal Daily  . thiamine  100 mg Oral Daily   Or  . thiamine  100 mg Intravenous Daily   Continuous Infusions: . sodium chloride    . ampicillin (OMNIPEN) IV 2 g (03/08/21 1401)  . cefTRIAXone (ROCEPHIN)  IV 2 g (03/08/21 7673)  . methocarbamol (ROBAXIN) IV     PRN Meds: sodium chloride, acetaminophen **OR** acetaminophen, melatonin, methocarbamol (ROBAXIN) IV, ondansetron **OR** ondansetron (ZOFRAN) IV, polyethylene glycol, traZODone   Vital Signs    Vitals:   03/07/21 1923 03/08/21 0438 03/08/21 0759 03/08/21 1349  BP: 122/73 124/80 127/77 133/79  Pulse: 84 85 76 70  Resp: 16 16 20 20   Temp: 98.5 F (36.9 C) 98.3 F (36.8 C) 98.4 F (36.9 C)   TempSrc: Oral Oral Oral   SpO2: 100% 99% 98% 100%  Weight:      Height:        Intake/Output Summary (Last 24 hours) at 03/08/2021 1428 Last data filed at 03/08/2021 1406 Gross per 24 hour  Intake 1120 ml  Output --  Net 1120 ml   Filed Weights   03/02/21 1533 03/06/21 0859  Weight: 62.6 kg 59.9 kg    Physical Exam   GEN: Well nourished, well developed, in no acute distress.  HEENT: Grossly normal.  Neck: Supple, no JVD, carotid bruits, or masses. Cardiac: RRR, 3/6 high pitched systolic murmur throughout, loudest @ the apex  axilla.  No rubs, or gallops. No clubbing, cyanosis, edema.  Radials 2+, DP/PT 2+ and equal bilaterally.  Respiratory:  Respirations regular and unlabored, clear to auscultation bilaterally. GI: Soft, nontender, nondistended, BS + x 4. MS: no deformity or atrophy. Skin: warm and dry, no rash. Neuro:  Strength and sensation are intact. Psych: AAOx3.   Normal affect.  Labs    Chemistry Recent Labs  Lab 03/02/21 1631 03/03/21 0552 03/06/21 0444 03/07/21 0631 03/08/21 0425  NA 130*   < > 133* 132* 137  K 4.3   < > 5.9* 4.7 4.9  CL 98   < > 102 101 105  CO2 24   < > 23 22 23   GLUCOSE 97   < > 143* 111* 102*  BUN 75*   < > 79* 80* 76*  CREATININE 4.31*   < > 5.00* 4.18* 3.70*  CALCIUM 7.2*   < > 8.4* 7.8* 8.1*  PROT 6.5  --   --   --   --   ALBUMIN 2.7*   < > 2.5* 2.3* 2.3*  AST 15  --   --   --   --   ALT 12  --   --   --   --   ALKPHOS 46  --   --   --   --   BILITOT 0.4  --   --   --   --   GFRNONAA 18*   < > 15* 19* 22*  ANIONGAP 8   < > 8 9 9    < > = values in this interval not displayed.     Hematology Recent Labs  Lab 03/06/21 0444 03/07/21 0631 03/08/21 0425  WBC 6.3 7.7 7.9  RBC 3.62* 2.99* 2.94*  HGB 9.9* 8.3* 8.4*  HCT 31.1* 25.8* 25.2*  MCV 85.9 86.3 85.7  MCH 27.3 27.8 28.6  MCHC 31.8 32.2 33.3  RDW 13.1 13.4 13.5  PLT 287 292 309    Lipids  Lab Results  Component Value Date   CHOL 200 02/23/2014   HDL 71.80 02/23/2014   LDLCALC 107 (H) 02/23/2014   TRIG 104.0 02/23/2014   CHOLHDL 3 02/23/2014    Radiology    CT ABDOMEN PELVIS WO CONTRAST  Result Date: 03/06/2021 CLINICAL DATA:  Chronic diarrhea and weight loss. Left-sided abdominal pain for 1 week. Poor kidney function. EXAM: CT ABDOMEN AND PELVIS WITHOUT CONTRAST TECHNIQUE: Multidetector CT imaging of the abdomen and pelvis was performed following the standard protocol without IV contrast. COMPARISON:  03/02/2021 FINDINGS: Lower chest: The lung bases are clear. The heart size is normal. The intracardiac blood pool is hypodense relative to the adjacent myocardium consistent with anemia. Hepatobiliary: The liver is normal. Normal gallbladder.There is no biliary ductal dilation. Pancreas: Normal contours without ductal dilatation. No peripancreatic fluid collection. Spleen: There is a stable hypodense area in the splenic parenchyma towards the  hilum. Adrenals/Urinary Tract: --Adrenal glands: Unremarkable. --Right kidney/ureter: No hydronephrosis or radiopaque kidney stones. --Left kidney/ureter: In the left perinephric space there is a new air and fluid collection abutting the lower pole of the left kidney. This collection measures approximately 5.9 cm across. This is likely a small perinephric hematoma in the setting of recent renal biopsy. --Urinary bladder: There is diffuse bladder wall thickening. Stomach/Bowel: --Stomach/Duodenum: No hiatal hernia or other gastric abnormality. Normal duodenal course and caliber. --Small bowel: There is mild wall thickening of several loops of small bowel in the mid left abdomen. --Colon: There appears to be mild wall thickening of the descending colon. --Appendix: Normal. Vascular/Lymphatic: Normal course and caliber of the major abdominal vessels. --No retroperitoneal lymphadenopathy. --No mesenteric lymphadenopathy. --No pelvic or inguinal lymphadenopathy. Reproductive: Unremarkable Other: There is a small volume of free fluid in the abdomen and pelvis. There is anasarca. Musculoskeletal. 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 IMPRESSION: 1. New collection about the left kidney favored to represent a small perinephric hematoma in the setting of recent renal biopsy. 2. Anemia. 3. Anasarca. 4. No specific abnormality identified to explain the patient's symptoms. Electronically Signed   By: Katherine Mantle M.D.   On: 03/06/2021 19:47   Telemetry    Not on tele  Cardiac Studies   Transesophageal echo 4.6.2022  1. Left ventricular ejection fraction, by estimation, is 60 to 65%. The  left ventricle has normal function. The left ventricle has no regional  wall motion abnormalities.  2. Right ventricular systolic function is normal. The right ventricular  size is normal.  3. No left atrial/left atrial appendage thrombus was detected.  4. There is a  ruptured posterior chordae of the mitral valve apparatus..  The mitral valve is abnormal. Mild to moderate mitral valve regurgitation.  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  5. The aortic valve is tricuspid. Aortic valve regurgitation is not  visualized.   Patient Profile     30 y.o.malewith hx of etoh use, tobacco use presenting with sob, edema and aki. Found to have moderate mitral regurgitation and possible flail posterior leaflet, transesophageal echo confirming mitral valve pathology, unable to exclude endocarditis.  Assessment & Plan    1.  Moderate MR/Ruptured chordae:  Noted on TEE 4/6.  Unable to exclude component of SBE.  Hemodynamically stable.  Euvolemic w/o dyspnea.  Plan is for full course of abx.  W/ significant valve pathology, youth, will likely benefit from MVR and expedited outpt CT surgical eval, provided that he remains clinically stable.  2.  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 BUN/Creat improving  76/3.7.  Nephrology following.  3.  Enterococcus bacteremia:  ID following.  Broad spectrum abx.  4.  Normocytic anemia:  Stable.   Signed, Nicolasa Ducking, NP  03/08/2021, 2:28 PM    For questions or updates, please contact   Please consult [email or web address] for contact info under Cardiology/STEMI.

## 2021-03-08 NOTE — TOC Progression Note (Addendum)
Transition of Care [facility]) - Progression Note    Patient Details  Name: Jerome Mccormick MRN: [id number] Date of Birth: March 10, 1991  [facility]) CM/SW Contact  Liliana Cline, LCSW Phone Number: 03/08/2021, 8:33 AM  Clinical Narrative:   Emailed Financial Counselors to ask them to screen patient for Medicaid.  Update: Per Financial Counseling, patient has been screened for Medicaid by [employer] and is not eligible. Maryjane Hurter to reach out to patient today to assess for further Financial Assistance/needs.   Spoke to [employer] who confirmed she and [facility] are working on home IV meds.  Expected Discharge Plan: Home/Self Care Barriers to Discharge: Continued Medical Work up  Expected Discharge Plan and Services Expected Discharge Plan: Home/Self Care       Living arrangements for the past 2 months: Single Family Home                                       Social Determinants of Health (SDOH) Interventions    Readmission Risk Interventions No flowsheet data found.

## 2021-03-08 NOTE — Progress Notes (Signed)
Pharmacy Antibiotic Note  Jerome Mccormick is a 30 y.o. male admitted on 03/02/2021 found to have bacteremia. Patient was initially admitted for headache and congestion x 7 days, but was found to have Scr 4.1 (BL~0.9 in 2015) and BUN 75. Patient does not have a history of renal disease. 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 TEE revealed flail/ruptured posterior chordae of mitral valve apparatus, but no vegetations. 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 Blood cultures on 03/05/2021 from both sets of anaerobic bottles are positive for enterococcus faecalis (amp susc). Pharmacy has been consulted for ampicillin dosing.  D2 ampicillin/ceftriaxone - SCr improving - afebrile - TEE with mod MV regurg and chordae rupture  Plan: Continue ampicillin 2 g IV every 8 hours Monitor Scr - SCr improving and will need to increase dose if continues to improve Continue ceftriaxone 2gm IV q12h with ampicillin for suspected endocarditis Repeat blood cultures are NGTD F/u plan (renal fx)  for line placement for long-term abx  Height: 5\' 7"  (170.2 cm) Weight: 59.9 kg (132 lb) IBW/kg (Calculated) : 66.1  Temp (24hrs), Avg:98.2 F (36.8 C), Min:97.7 F (36.5 C), Max:98.5 F (36.9 C)  Recent Labs  Lab 03/03/21 0552 03/04/21 0633 03/05/21 0454 03/06/21 0444 03/07/21 0631 03/08/21 0425  WBC 9.5 7.0  --  6.3 7.7 7.9  CREATININE 4.10* 4.35* 4.22* 5.00* 4.18* 3.70*    Estimated Creatinine Clearance: 24.7 mL/min (A) (by C-G formula based on SCr of 3.7 mg/dL (H)).    No Known Allergies  Antimicrobials this admission: Ceftriaxone 4/2 x1  Ampicillin 4/6>> Ceftriaxone 4/6 >>  Microbiology results: 4/5 BCx: both sets of anaerobic bottles positive for enterococcus faecalis, no vancomycin resistance detected. Amp susc  Thank you for allowing pharmacy to be a part of this patient's care.  6/6, PharmD, BCPS.   Work Cell:  [phone] 03/08/2021 10:40 AM

## 2021-03-08 NOTE — Progress Notes (Signed)
PROGRESS NOTE    HPI was taken from Dr. Crissie Reese:  Jerome Mccormick is a 30 y.o. male with no past medical history who presents for headaches.  Patient reports that for about the past week he has been having significant migraines.  He has had migraines in the past however these have persisted for longer than he usually after prior.  He has also had associated nausea and vomiting.  His mother eventually convinced him to present to the ED for evaluation.  On further history patient reports that he is also had significantly increased nasal congestion as well has nosebleeds.  Does not usually get nosebleeds, has not had any active bleeding from the nose but is having frequent bloody mucus.  On questioning admits that about 2 to 3 weeks ago he had intermittent gross hematuria in his urine but has not had any recently.  Reports that he does have swelling, feels that his face is swollen and does not look like it usually does, in addition feels like he has very swollen legs.  Endorses having had a rash, still mildly present on his legs but was much worse previously.  He reports he smokes about a half a pack a day.  His father had hypertension and diabetes, passed away from complications due to end-stage renal disease on dialysis.  No other family history of kidney problems.  In the ED initial vital signs unremarkable and notable for no hypertension.  Initial management and treatment focused on evaluation of headaches, he was given Benadryl, Toradol, Reglan, as well as IV fluids and sent for CT scan of the head which was unremarkable.  Baseline labs subsequently returned with unexpected results of CMP with creatinine of 4.3 and BUN of 75, sodium 130, albumin 2.7, remainder unremarkable.  CBC showed anemia with hemoglobin of 8.8 but otherwise unremarkable.  UA notable for greater than 50 RBC and WBCs as well as large amount of protein.  UDS positive for cannabinoids and tricyclics.  CT renal stone study was  obtained and showed unremarkable kidneys and a small amount of free fluid in the pelvis of unclear etiology.  Nephrology was contacted and recommended work-up for possible vasculitis.   Hospital course from Dr. 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 Pt will f/u w/ CT surg as an outpatient as per cardio. Furthermore, pt's blood cxs were growing enterococcus faecalis and pt was found to have AKI likely secondary above stated infection. Pt had kidney biopsy on 03/04/21 and prelim path shows glomerulonephritis. Cr is trending down slightly today. No HD indicated currently. For more information, please see other progress/consult notes.   4/8-c/o trazadone not helping him sleep last night. Discussed starting on melatonin, he is willing to give it a try.   Jerome Mccormick  JXB:[id number] DOB: November 09, 1991 DOA: 03/02/2021 PCP: Patient, No Pcp Per (Inactive)    Assessment & Plan:   Principal Problem:   AKI (acute kidney injury) (HCC) Active Problems:   Anemia   Cardiac murmur   Nonrheumatic mitral valve regurgitation  Enterococcus endocarditis: Cardiac ID following Continue IV ampicillin and ceftriaxone Repeat blood cultures pending    Enterococcus faecalis bacteremia:  4/8-sensitive to IV ampicillin Repeat blood cultures pending, negative to date we will follow up Continue ampicillin and ceftriaxone Will need colonoscopy as outpatient with GI   AKI: w/ hematuria & proteinuria. Baseline Cr is  0.9.  Nephrology following  ANA, ribonucleic protein ab is positive. S/p kidney biopsy on 03/04/21, awaiting on pathology.  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 ASO titer is normal. No acute need for dialysis at this time Will require PICC and antibiotics for 6 weeks Awaiting negative blood cultures to place PICC    Flail/ruptured posterior chordae : echo shows normal EF, normal diastolic function & MV w/ possible prolapse & possible flail leaflet & associated moderate MV regurgitation. 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 4/8-likely benefit from MVR and expedited outpatient CT surgical evaluation, provided that he is remains clinically stable Cardiology following    Normocytic anemia: H&H remained stable we will continue to monitor  Currently no need for transfusion     Hyperkalemia: Resolved.  Potassium 4.9 today we will need to monitor  Hyponatremia: etiology unclear resolved.  Continue to monitor  Hx of alcohol abuse: continue on CIWA     DVT prophylaxis: SCDs Code Status: full  Family Communication: Mother updated at bedside Disposition Plan: likely d/c back home   Level of care: Med-Surg   Status is: Inpatient  Remains inpatient appropriate because:Ongoing diagnostic testing needed not appropriate for outpatient work up, IV treatments appropriate due to intensity of illness or inability to take PO and Inpatient level of care appropriate due to severity of illness   Dispo: The patient is from: Home              Anticipated d/c is to: Home              Patient currently is not medically stable to d/c.   Difficult to place patient Yes        Consultants:   Cardio  nephro  CT surg    Procedures:    Antimicrobials: ampicillin, ceftriaxone    Subjective: Complaining of not getting enough sleep at night.  No shortness of breath or chest pain  Objective: Vitals:   03/07/21 1923 03/08/21 0438 03/08/21 0759 03/08/21 1349  BP: 122/73 124/80 127/77 133/79  Pulse: 84 85 76 70  Resp: 16 16 20 20   Temp: 98.5 F (36.9 C) 98.3 F (36.8 C) 98.4 F  (36.9 C)   TempSrc: Oral Oral Oral   SpO2: 100% 99% 98% 100%  Weight:      Height:        Intake/Output Summary (Last 24 hours) at 03/08/2021 1437 Last data filed at 03/08/2021 1406 Gross per 24 hour  Intake 1120 ml  Output --  Net 1120 ml   Filed Weights   03/02/21 1533 03/06/21 0859  Weight: 62.6 kg 59.9 kg    Examination:  NAD, calm comfortable sitting in bed CTA, no wheeze rales rhonchi's Loud 3/6 holosystolic murmur heard loudest at left sternal base Soft benign positive bowel sounds No edema Alert oriented x3 grossly intact    Data Reviewed: I have personally reviewed following labs and imaging studies  CBC: Recent Labs  Lab 03/02/21 1631 03/03/21 0552 03/04/21 0633 03/06/21 0444 03/07/21 0631 03/08/21 0425  WBC 7.6 9.5 7.0 6.3 7.7 7.9  NEUTROABS 5.7  --   --   --   --   --   HGB 8.8* 9.2* 9.0* 9.9* 8.3* 8.4*  HCT 27.5* 28.6* 28.0* 31.1* 25.8* 25.2*  MCV 88.1 86.9 87.5 85.9 86.3 85.7  PLT 309 319 295 287 292 309   Basic Metabolic Panel: Recent Labs  Lab 03/04/21 0633 03/05/21 0454 03/06/21 0444 03/07/21 0631 03/08/21 0425  NA 133* 132* 133* 132* 137  K 4.2 4.8 5.9* 4.7 4.9  CL 103 103 102 101 105  CO2 21* GLUCOSE 106* 108* 143* 111* 102*  BUN 73* 72* 79* 80* 76*  CREATININE 4.35* 4.22* 5.00* 4.18* 3.70*  CALCIUM 7.4* 7.7* 8.4* 7.8* 8.1*  MG  --   --   --  3.1* 2.8*  PHOS 5.6* 5.7* 7.7* 6.4* 6.5*   GFR: Estimated Creatinine Clearance: 24.7 mL/min (A) (by C-G formula based on SCr of 3.7 mg/dL (H)). Liver Function Tests: Recent Labs  Lab 03/02/21 1631 03/04/21 0865 03/05/21 0454 03/06/21 0444 03/07/21 0631 03/08/21 0425  AST 15  --   --   --   --   --   ALT 12  --   --   --   --   --   ALKPHOS 46  --   --   --   --   --   BILITOT 0.4  --   --   --   --   --   PROT 6.5  --   --   --   --   --   ALBUMIN 2.7* 2.3* 2.3* 2.5* 2.3* 2.3*   No results for input(s): LIPASE, AMYLASE in the last 168 hours. No results for  input(s): AMMONIA in the last 168 hours. Coagulation Profile: Recent Labs  Lab 03/04/21 0633  INR 1.2   Cardiac Enzymes: No results for input(s): CKTOTAL, CKMB, CKMBINDEX, TROPONINI in the last 168 hours. BNP (last 3 results) No results for input(s): PROBNP in the last 8760 hours. HbA1C: No results for input(s): HGBA1C in the last 72 hours. CBG: No results for input(s): GLUCAP in the last 168 hours. Lipid Profile: No results for input(s): CHOL, HDL, LDLCALC, TRIG, CHOLHDL, LDLDIRECT in the last 72 hours. Thyroid Function Tests: No results for input(s): TSH, T4TOTAL, FREET4, T3FREE, THYROIDAB in the last 72 hours. Anemia Panel: No results for input(s): VITAMINB12, FOLATE, FERRITIN, TIBC, IRON, RETICCTPCT in the last 72 hours. Sepsis Labs: No results for input(s): PROCALCITON, LATICACIDVEN in the last 168 hours.  Recent Results (from the past 240 hour(s))  Resp Panel by RT-PCR (Flu A&B, Covid) Nasopharyngeal Swab     Status: None   Collection Time: 03/02/21  4:31 PM   Specimen: Nasopharyngeal Swab; Nasopharyngeal(NP) swabs in vial transport medium  Result Value Ref Range Status   SARS Coronavirus 2 by RT PCR NEGATIVE NEGATIVE Final    Comment: (NOTE) SARS-CoV-2 target nucleic acids are NOT DETECTED.  The SARS-CoV-2 RNA is generally detectable in upper respiratory specimens during the acute phase of infection. The lowest concentration of SARS-CoV-2 viral copies this assay can detect is 138 copies/mL. A negative result does not preclude SARS-Cov-2 infection and should not be used as the sole basis for treatment or other patient management decisions. A negative result may occur with  improper specimen collection/handling, submission of specimen other than nasopharyngeal swab, presence of viral mutation(s) within the areas targeted by this assay, and inadequate number of viral copies(<138 copies/mL). A negative result must be combined with clinical observations, patient history,  and epidemiological information. The expected result is Negative.  Fact Sheet for Patients:  BloggerCourse.com  Fact Sheet for Healthcare Providers:  SeriousBroker.it  This test is no t yet approved or cleared by the [place] FDA and  has been authorized for detection and/or diagnosis of SARS-CoV-2 by FDA under an Emergency Use Authorization (EUA). This EUA will remain  in effect (meaning this test can be used) for the duration of the COVID-19 declaration under Section 564(b)(1) of the Act, 21 U.S.C.section 360bbb-3(b)(1), unless the authorization is terminated  or revoked sooner.       Influenza A by PCR NEGATIVE NEGATIVE Final   Influenza B by PCR NEGATIVE NEGATIVE Final    Comment: (NOTE) The Xpert Xpress SARS-CoV-2/FLU/RSV plus assay is intended as an aid in the diagnosis of influenza from Nasopharyngeal swab specimens and should not be used as a sole basis for treatment. Nasal washings and aspirates are unacceptable for Xpert Xpress SARS-CoV-2/FLU/RSV testing.  Fact Sheet for Patients: BloggerCourse.comhttps://www.fda.gov/media/152166/download  Fact Sheet for Healthcare Providers: SeriousBroker.ithttps://www.fda.gov/media/152162/download  This test is not yet approved or cleared by the [place] FDA and has been authorized for detection and/or diagnosis of SARS-CoV-2 by FDA under an Emergency Use Authorization (EUA). This EUA will remain in effect (meaning this test can be used) for the duration of the COVID-19 declaration under Section 564(b)(1) of the Act, 21 U.S.C. section 360bbb-3(b)(1), unless the authorization is terminated or revoked.  Performed at [facility], [address] [place], [place] [address]   Group A Strep by PCR     Status: None   Collection Time: 03/05/21  4:46 PM   Specimen: Throat; Sterile Swab  Result Value Ref Range Status   Group A Strep by PCR NOT DETECTED NOT DETECTED Final    Comment: Performed at  [facility], [address] [place], [place] [address]  Culture, blood (Routine X 2) w Reflex to ID Panel     Status: Abnormal   Collection Time: 03/05/21  5:46 PM   Specimen: BLOOD  Result Value Ref Range Status   Specimen Description   Final    BLOOD LEFT ANTECUBITAL Performed at [facility], [address] [place] [address] [place], [place] [address]    Special Requests   Final    BOTTLES DRAWN AEROBIC AND ANAEROBIC Blood Culture adequate volume Performed at [facility], [address] [place], [place] [address]    Culture  Setup Time   Final    GRAM POSITIVE COCCI IN BOTH AEROBIC AND ANAEROBIC BOTTLES CRITICAL VALUE NOTED.  VALUE IS CONSISTENT WITH PREVIOUSLY REPORTED AND CALLED VALUE. Performed at [facility], [address] [place], [place] [address]    Culture (A)  Final    ENTEROCOCCUS FAECALIS SUSCEPTIBILITIES PERFORMED ON PREVIOUS CULTURE WITHIN THE LAST 5 DAYS. Performed at [facility], [address] [place], [place] [address]    Report Status 03/08/2021 FINAL  Final  Culture, blood (Routine X 2) w Reflex to ID Panel     Status: Abnormal   Collection Time: 03/05/21  5:50 PM   Specimen: BLOOD  Result Value Ref Range Status   Specimen Description   Final    BLOOD RIGHT ANTECUBITAL Performed at [facility], [address] [place], [place] [address]    Special Requests   Final    BOTTLES DRAWN AEROBIC AND ANAEROBIC Blood Culture adequate volume Performed at [facility], [address] [place], [place] [address]    Culture  Setup Time   Final    Organism ID to follow GRAM POSITIVE COCCI IN BOTH AEROBIC AND ANAEROBIC BOTTLES CRITICAL RESULT CALLED TO, READ BACK BY AND VERIFIED WITH: SUSAN WATSON AT 1146 03/06/21  SDR Performed at [facility], [address] [place], [place] [address]    Culture ENTEROCOCCUS FAECALIS (A)  Final   Report Status 03/08/2021 FINAL  Final   Organism ID, Bacteria  ENTEROCOCCUS FAECALIS  Final      Susceptibility   Enterococcus faecalis - MIC*    AMPICILLIN <=2 SENSITIVE Sensitive     VANCOMYCIN 1 SENSITIVE Sensitive     GENTAMICIN SYNERGY SENSITIVE Sensitive     * ENTEROCOCCUS FAECALIS  Blood Culture ID Panel (Reflexed)     Status: Abnormal   Collection Time: 03/05/21  5:50 PM  Result Value Ref Range Status   Enterococcus faecalis DETECTED (A) NOT DETECTED Final    Comment: CRITICAL RESULT CALLED TO, READ BACK BY AND VERIFIED WITH:  SUSAN WATSON AT 1146 03/06/21 SDR    Enterococcus Faecium NOT DETECTED NOT DETECTED Final   Listeria monocytogenes NOT DETECTED NOT DETECTED Final   Staphylococcus species NOT DETECTED NOT DETECTED Final   Staphylococcus aureus (BCID) NOT DETECTED NOT DETECTED Final   Staphylococcus epidermidis NOT DETECTED NOT DETECTED Final   Staphylococcus lugdunensis NOT DETECTED NOT DETECTED Final   Streptococcus species NOT DETECTED NOT DETECTED Final   Streptococcus agalactiae NOT DETECTED NOT DETECTED Final   Streptococcus pneumoniae NOT DETECTED NOT DETECTED Final   Streptococcus pyogenes NOT DETECTED NOT DETECTED Final   A.calcoaceticus-baumannii NOT DETECTED NOT DETECTED Final   Bacteroides fragilis NOT DETECTED NOT DETECTED Final   Enterobacterales NOT DETECTED NOT DETECTED Final   Enterobacter cloacae complex NOT DETECTED NOT DETECTED Final   Escherichia coli NOT DETECTED NOT DETECTED Final   Klebsiella aerogenes NOT DETECTED NOT DETECTED Final   Klebsiella oxytoca NOT DETECTED NOT DETECTED Final   Klebsiella pneumoniae NOT DETECTED NOT DETECTED Final   Proteus species NOT DETECTED NOT DETECTED Final   Salmonella species NOT DETECTED NOT DETECTED Final   Serratia marcescens NOT DETECTED NOT DETECTED Final   Haemophilus influenzae NOT DETECTED NOT DETECTED Final   Neisseria meningitidis NOT DETECTED NOT DETECTED Final   Pseudomonas aeruginosa NOT DETECTED NOT DETECTED Final   Stenotrophomonas maltophilia NOT DETECTED  NOT DETECTED Final   Candida albicans NOT DETECTED NOT DETECTED Final   Candida auris NOT DETECTED NOT DETECTED Final   Candida glabrata NOT DETECTED NOT DETECTED Final   Candida krusei NOT DETECTED NOT DETECTED Final   Candida parapsilosis NOT DETECTED NOT DETECTED Final   Candida tropicalis NOT DETECTED NOT DETECTED Final   Cryptococcus neoformans/gattii NOT DETECTED NOT DETECTED Final   Vancomycin resistance NOT DETECTED NOT DETECTED Final    Comment: Performed at [facility], [address] [place], [place] [address]  Culture, blood (single) w Reflex to ID Panel     Status: None (Preliminary result)   Collection Time: 03/07/21  6:31 AM   Specimen: BLOOD  Result Value Ref Range Status   Specimen Description BLOOD  LEFT AC  Final   Special Requests   Final    BOTTLES DRAWN AEROBIC AND ANAEROBIC Blood Culture results may not be optimal due to an excessive volume of blood received in culture bottles   Culture   Final    NO GROWTH < 24 HOURS Performed at [facility], [address] [place], [place] [address]    Report Status PENDING  Incomplete         Radiology Studies: CT ABDOMEN PELVIS WO CONTRAST  Result Date: 03/06/2021 CLINICAL DATA:  Chronic diarrhea and weight loss. Left-sided abdominal pain for 1 week. Poor kidney  function. EXAM: CT ABDOMEN AND PELVIS WITHOUT CONTRAST TECHNIQUE: Multidetector CT imaging of the abdomen and pelvis was performed following the standard protocol without IV contrast. COMPARISON:  03/02/2021 FINDINGS: Lower chest: The lung bases are clear. The heart size is normal. The intracardiac blood pool is hypodense relative to the adjacent myocardium consistent with anemia. Hepatobiliary: The liver is normal. Normal gallbladder.There is no biliary ductal dilation. Pancreas: Normal contours without ductal dilatation. No peripancreatic fluid collection. Spleen: There is a stable hypodense area in the splenic parenchyma towards the hilum.  Adrenals/Urinary Tract: --Adrenal glands: Unremarkable. --Right kidney/ureter: No hydronephrosis or radiopaque kidney stones. --Left kidney/ureter: In the left perinephric space there is a new air and fluid collection abutting the lower pole of the left kidney. This collection measures approximately 5.9 cm across. This is likely a small perinephric hematoma in the setting of recent renal biopsy. --Urinary bladder: There is diffuse bladder wall thickening. Stomach/Bowel: --Stomach/Duodenum: No hiatal hernia or other gastric abnormality. Normal duodenal course and caliber. --Small bowel: There is mild wall thickening of several loops of small bowel in the mid left abdomen. --Colon: There appears to be mild wall thickening of the descending colon. --Appendix: Normal. Vascular/Lymphatic: Normal course and caliber of the major abdominal vessels. --No retroperitoneal lymphadenopathy. --No mesenteric lymphadenopathy. --No pelvic or inguinal lymphadenopathy. Reproductive: Unremarkable Other: There is a small volume of free fluid in the abdomen and pelvis. There is anasarca. Musculoskeletal. 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 IMPRESSION: 1. New collection about the left kidney favored to represent a small perinephric hematoma in the setting of recent renal biopsy. 2. Anemia. 3. Anasarca. 4. No specific abnormality identified to explain the patient's symptoms. Electronically Signed   By: Katherine Mantle M.D.   On: 03/06/2021 19:47        Scheduled Meds: . folic acid  1 mg Oral Daily  . multivitamin with minerals  1 tablet Oral Daily  . nicotine  14 mg Transdermal Daily  . thiamine  100 mg Oral Daily   Or  . thiamine  100 mg Intravenous Daily   Continuous Infusions: . sodium chloride    . ampicillin (OMNIPEN) IV 2 g (03/08/21 1401)  . cefTRIAXone (ROCEPHIN)  IV 2 g (03/08/21 3557)  . methocarbamol (ROBAXIN) IV       LOS: 6 days    Time spent: 35  mins     Lynn Ito, MD [facility] Pager [phone]  If 7PM-7AM, please contact night-coverage 03/08/2021, 2:37 PM

## 2021-03-08 NOTE — Telephone Encounter (Signed)
The patient is scheduled to see [facility]- Dr. Cornelius Moras on 03/20/21.   To Dr. Mariah Milling to advise when the patient should follow up in our office.

## 2021-03-09 LAB — RENAL FUNCTION PANEL
Albumin: 2.3 g/dL — ABNORMAL LOW (ref 3.5–5.0)
Anion gap: 10 (ref 5–15)
BUN: 66 mg/dL — ABNORMAL HIGH (ref 6–20)
CO2: 24 mmol/L (ref 22–32)
Calcium: 7.9 mg/dL — ABNORMAL LOW (ref 8.9–10.3)
Chloride: 104 mmol/L (ref 98–111)
Creatinine, Ser: 3.38 mg/dL — ABNORMAL HIGH (ref 0.61–1.24)
GFR, Estimated: 24 mL/min — ABNORMAL LOW (ref >60)
Glucose, Bld: 103 mg/dL — ABNORMAL HIGH (ref 70–99)
Phosphorus: 6.2 mg/dL — ABNORMAL HIGH (ref 2.5–4.6)
Potassium: 4.5 mmol/L (ref 3.5–5.1)
Sodium: 138 mmol/L (ref 135–145)

## 2021-03-09 NOTE — Progress Notes (Signed)
[facility]  PROGRESS NOTE   Subjective:   Patient is sitting in bed in no acute distress. As per patient he ate very well today.  He has been ambulating in the room. He said his urine output has been excellent.  Objective:  Vital signs in last 24 hours:  Temp:  [97.5 F (36.4 C)-98.7 F (37.1 C)] 97.8 F (36.6 C) (04/09 1112) Pulse Rate:  [66-82] 68 (04/09 1112) Resp:  [16-20] 20 (04/09 0728) BP: (115-134)/(71-85) 119/75 (04/09 1112) SpO2:  [99 %-100 %] 100 % (04/09 1112)  Weight change:  Filed Weights   03/02/21 1533 03/06/21 0859  Weight: 62.6 kg 59.9 kg    Intake/Output: I/O last 3 completed shifts: In: 1103.7 [P.O.:360; I.V.:11.4; IV Piggyback:732.2] Out: -    Intake/Output this shift:  Total I/O In: 240 [P.O.:240] Out: -   Physical Exam: General:  No acute distress  Head:  Normocephalic, atraumatic. Moist oral mucosal membranes  Eyes:  Anicteric  Neck:  Supple  Lungs:   Clear to auscultation, normal effort  Heart:  S1S2 no rubs  Abdomen:   Soft, nontender, bowel sounds present  Extremities:  peripheral edema.  Neurologic:  Awake, alert, following commands  Skin:  No lesions  Access:     Basic Metabolic Panel: Recent Labs  Lab 03/05/21 0454 03/06/21 0444 03/07/21 0631 03/08/21 0425 03/09/21 0415  NA 132* 133* 132* 137 138  K 4.8 5.9* 4.7 4.9 4.5  CL 103 102 101 105 104  CO2 23 23 22 23 24   GLUCOSE 108* 143* 111* 102* 103*  BUN 72* 79* 80* 76* 66*  CREATININE 4.22* 5.00* 4.18* 3.70* 3.38*  CALCIUM 7.7* 8.4* 7.8* 8.1* 7.9*  MG  --   --  3.1* 2.8*  --   PHOS 5.7* 7.7* 6.4* 6.5* 6.2*    Liver Function Tests: Recent Labs  Lab 03/02/21 1631 03/04/21 05/04/21 03/05/21 0454 03/06/21 0444 03/07/21 0631 03/08/21 0425 03/09/21 0415  AST 15  --   --   --   --   --   --   ALT 12  --   --   --   --   --   --   ALKPHOS 46  --   --   --   --   --   --   BILITOT 0.4  --   --   --   --   --   --   PROT 6.5  --   --   --   --   --   --    ALBUMIN 2.7*   < > 2.3* 2.5* 2.3* 2.3* 2.3*   < > = values in this interval not displayed.   No results for input(s): LIPASE, AMYLASE in the last 168 hours. No results for input(s): AMMONIA in the last 168 hours.  CBC: Recent Labs  Lab 03/02/21 1631 03/03/21 0552 03/04/21 0633 03/06/21 0444 03/07/21 0631 03/08/21 0425  WBC 7.6 9.5 7.0 6.3 7.7 7.9  NEUTROABS 5.7  --   --   --   --   --   HGB 8.8* 9.2* 9.0* 9.9* 8.3* 8.4*  HCT 27.5* 28.6* 28.0* 31.1* 25.8* 25.2*  MCV 88.1 86.9 87.5 85.9 86.3 85.7  PLT 309 319 295 287 292 309    Cardiac Enzymes: No results for input(s): CKTOTAL, CKMB, CKMBINDEX, TROPONINI in the last 168 hours.  BNP: Invalid input(s): POCBNP  CBG: No results for input(s): GLUCAP in the last 168 hours.  Microbiology:  Results for orders placed or performed during the hospital encounter of 03/02/21  Resp Panel by RT-PCR (Flu A&B, Covid) Nasopharyngeal Swab     Status: None   Collection Time: 03/02/21  4:31 PM   Specimen: Nasopharyngeal Swab; Nasopharyngeal(NP) swabs in vial transport medium  Result Value Ref Range Status   SARS Coronavirus 2 by RT PCR NEGATIVE NEGATIVE Final    Comment: (NOTE) SARS-CoV-2 target nucleic acids are NOT DETECTED.  The SARS-CoV-2 RNA is generally detectable in upper respiratory specimens during the acute phase of infection. The lowest concentration of SARS-CoV-2 viral copies this assay can detect is 138 copies/mL. A negative result does not preclude SARS-Cov-2 infection and should not be used as the sole basis for treatment or other patient management decisions. A negative result may occur with  improper specimen collection/handling, submission of specimen other than nasopharyngeal swab, presence of viral mutation(s) within the areas targeted by this assay, and inadequate number of viral copies(<138 copies/mL). A negative result must be combined with clinical observations, patient history, and epidemiological information.  The expected result is Negative.  Fact Sheet for Patients:  BloggerCourse.comhttps://www.fda.gov/media/152166/download  Fact Sheet for Healthcare Providers:  SeriousBroker.ithttps://www.fda.gov/media/152162/download  This test is no t yet approved or cleared by the [place] FDA and  has been authorized for detection and/or diagnosis of SARS-CoV-2 by FDA under an Emergency Use Authorization (EUA). This EUA will remain  in effect (meaning this test can be used) for the duration of the COVID-19 declaration under Section 564(b)(1) of the Act, 21 U.S.C.section 360bbb-3(b)(1), unless the authorization is terminated  or revoked sooner.       Influenza A by PCR NEGATIVE NEGATIVE Final   Influenza B by PCR NEGATIVE NEGATIVE Final    Comment: (NOTE) The Xpert Xpress SARS-CoV-2/FLU/RSV plus assay is intended as an aid in the diagnosis of influenza from Nasopharyngeal swab specimens and should not be used as a sole basis for treatment. Nasal washings and aspirates are unacceptable for Xpert Xpress SARS-CoV-2/FLU/RSV testing.  Fact Sheet for Patients: BloggerCourse.comhttps://www.fda.gov/media/152166/download  Fact Sheet for Healthcare Providers: SeriousBroker.ithttps://www.fda.gov/media/152162/download  This test is not yet approved or cleared by the [place] FDA and has been authorized for detection and/or diagnosis of SARS-CoV-2 by FDA under an Emergency Use Authorization (EUA). This EUA will remain in effect (meaning this test can be used) for the duration of the COVID-19 declaration under Section 564(b)(1) of the Act, 21 U.S.C. section 360bbb-3(b)(1), unless the authorization is terminated or revoked.  Performed at [facility], [address] [place], [place] [address]   Group A Strep by PCR     Status: None   Collection Time: 03/05/21  4:46 PM   Specimen: Throat; Sterile Swab  Result Value Ref Range Status   Group A Strep by PCR NOT DETECTED NOT DETECTED Final    Comment: Performed at [facility], [address] [place], [place] [address]  Culture, blood (Routine X 2) w Reflex to ID Panel     Status: Abnormal   Collection Time: 03/05/21  5:46 PM   Specimen: BLOOD  Result Value Ref Range Status   Specimen Description   Final    BLOOD LEFT ANTECUBITAL Performed at [facility], [address] [place], [place] [address]    Special Requests   Final    BOTTLES DRAWN AEROBIC AND ANAEROBIC Blood Culture adequate volume Performed at [facility], [address] [place], [place] [address]    Culture  Setup Time   Final  GRAM POSITIVE COCCI IN BOTH AEROBIC AND ANAEROBIC BOTTLES CRITICAL VALUE NOTED.  VALUE IS CONSISTENT WITH PREVIOUSLY REPORTED AND CALLED VALUE. Performed at [facility], [address] [place], [place] [address]    Culture (A)  Final    ENTEROCOCCUS FAECALIS SUSCEPTIBILITIES PERFORMED ON PREVIOUS CULTURE WITHIN THE LAST 5 DAYS. Performed at [facility], [address] [place], [place] [address]    Report Status 03/08/2021 FINAL  Final  Culture, blood (Routine X 2) w Reflex to ID Panel     Status: Abnormal   Collection Time: 03/05/21  5:50 PM   Specimen: BLOOD  Result Value Ref Range Status   Specimen Description   Final    BLOOD RIGHT ANTECUBITAL Performed at [facility], [address], [place], [place] [address]    Special Requests   Final    BOTTLES DRAWN AEROBIC AND ANAEROBIC Blood Culture adequate volume Performed at [facility], [address] [place], [place] [address]    Culture  Setup Time   Final    Organism ID to follow GRAM POSITIVE COCCI IN BOTH AEROBIC AND ANAEROBIC BOTTLES CRITICAL RESULT CALLED TO, READ BACK BY AND VERIFIED WITH: SUSAN WATSON AT 1146 03/06/21 SDR Performed at [facility], [address] [place], [place] [address]    Culture ENTEROCOCCUS FAECALIS (A)  Final   Report Status 03/08/2021 FINAL  Final   Organism ID, Bacteria ENTEROCOCCUS FAECALIS  Final       Susceptibility   Enterococcus faecalis - MIC*    AMPICILLIN <=2 SENSITIVE Sensitive     VANCOMYCIN 1 SENSITIVE Sensitive     GENTAMICIN SYNERGY SENSITIVE Sensitive     * ENTEROCOCCUS FAECALIS  Blood Culture ID Panel (Reflexed)     Status: Abnormal   Collection Time: 03/05/21  5:50 PM  Result Value Ref Range Status   Enterococcus faecalis DETECTED (A) NOT DETECTED Final    Comment: CRITICAL RESULT CALLED TO, READ BACK BY AND VERIFIED WITH:  SUSAN WATSON AT 1146 03/06/21 SDR    Enterococcus Faecium NOT DETECTED NOT DETECTED Final   Listeria monocytogenes NOT DETECTED NOT DETECTED Final   Staphylococcus species NOT DETECTED NOT DETECTED Final   Staphylococcus aureus (BCID) NOT DETECTED NOT DETECTED Final   Staphylococcus epidermidis NOT DETECTED NOT DETECTED Final   Staphylococcus lugdunensis NOT DETECTED NOT DETECTED Final   Streptococcus species NOT DETECTED NOT DETECTED Final   Streptococcus agalactiae NOT DETECTED NOT DETECTED Final   Streptococcus pneumoniae NOT DETECTED NOT DETECTED Final   Streptococcus pyogenes NOT DETECTED NOT DETECTED Final   A.calcoaceticus-baumannii NOT DETECTED NOT DETECTED Final   Bacteroides fragilis NOT DETECTED NOT DETECTED Final   Enterobacterales NOT DETECTED NOT DETECTED Final   Enterobacter cloacae complex NOT DETECTED NOT DETECTED Final   Escherichia coli NOT DETECTED NOT DETECTED Final   Klebsiella aerogenes NOT DETECTED NOT DETECTED Final   Klebsiella oxytoca NOT DETECTED NOT DETECTED Final   Klebsiella pneumoniae NOT DETECTED NOT DETECTED Final   Proteus species NOT DETECTED NOT DETECTED Final   Salmonella species NOT DETECTED NOT DETECTED Final   Serratia marcescens NOT DETECTED NOT DETECTED Final   Haemophilus influenzae NOT DETECTED NOT DETECTED Final   Neisseria meningitidis NOT DETECTED NOT DETECTED Final   Pseudomonas aeruginosa NOT DETECTED NOT DETECTED Final   Stenotrophomonas maltophilia NOT DETECTED NOT DETECTED Final   Candida  albicans NOT DETECTED NOT DETECTED Final   Candida auris NOT DETECTED NOT DETECTED Final   Candida glabrata NOT DETECTED  NOT DETECTED Final   Candida krusei NOT DETECTED NOT DETECTED Final   Candida parapsilosis NOT DETECTED NOT DETECTED Final   Candida tropicalis NOT DETECTED NOT DETECTED Final   Cryptococcus neoformans/gattii NOT DETECTED NOT DETECTED Final   Vancomycin resistance NOT DETECTED NOT DETECTED Final    Comment: Performed at [facility], [address] [place], [place] [address]  Culture, blood (single) w Reflex to ID Panel     Status: None (Preliminary result)   Collection Time: 03/07/21  6:31 AM   Specimen: BLOOD  Result Value Ref Range Status   Specimen Description BLOOD  LEFT AC  Final   Special Requests   Final    BOTTLES DRAWN AEROBIC AND ANAEROBIC Blood Culture results may not be optimal due to an excessive volume of blood received in culture bottles   Culture   Final    NO GROWTH 2 DAYS Performed at [facility], [address], [place], [place] [address]    Report Status PENDING  Incomplete    Coagulation Studies: No results for input(s): LABPROT, INR in the last 72 hours.  Urinalysis: No results for input(s): COLORURINE, LABSPEC, PHURINE, GLUCOSEU, HGBUR, BILIRUBINUR, KETONESUR, PROTEINUR, UROBILINOGEN, NITRITE, LEUKOCYTESUR in the last 72 hours.  Invalid input(s): APPERANCEUR    Imaging: No results found.   Medications:   . sodium chloride Stopped (03/09/21 0456)  . ampicillin (OMNIPEN) IV 300 mL/hr at 03/09/21 0457  . cefTRIAXone (ROCEPHIN)  IV 2 g (03/09/21 1002)  . methocarbamol (ROBAXIN) IV     . folic acid  1 mg Oral Daily  . multivitamin with minerals  1 tablet Oral Daily  . nicotine  14 mg Transdermal Daily  . thiamine  100 mg Oral Daily   Or  . thiamine  100 mg Intravenous Daily     Assessment/ Plan:     Principal Problem:   AKI (acute kidney injury) (HCC) Active Problems:   Anemia   Cardiac murmur    Nonrheumatic mitral valve regurgitation   Mitral valve disease   30 y.o. male significant use of nonsteroidal anti-inflammatory drugs.  He was admitted to the hospital with history of severe headaches.   He is now found to have acute kidney injury and also enterococcal bacteremia.   #1 acute kidney injury associated with acute nephrotic syndrome.  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  Mostly neutrophilic infiltrate.  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 ASO titer is normal.  Influenza, SARS coronavirus, hepatitis B studies, hepatitis C studies, HIV are negative. Blood culture Positive for enterococcus faecalison IV antibiotics  Renal indices are presently stable and urine output is excellent as per patient. Patient is scheduled for PICC line for continuous antibiotic infusion. Patient may have chronic kidney disease due to NSAIDs usage. We will continue to monitor daily renal profile. Medications are appropriate at this time for the patient's GFR.    LOS: 7 Jerome Mccormick 4/9/202212:36 PM

## 2021-03-09 NOTE — Progress Notes (Signed)
PROGRESS NOTE    HPI was taken from Dr. Crissie Reese:  Jerome Mccormick is a 30 y.o. male with no past medical history who presents for headaches.  Patient reports that for about the past week he has been having significant migraines.  He has had migraines in the past however these have persisted for longer than he usually after prior.  He has also had associated nausea and vomiting.  His mother eventually convinced him to present to the ED for evaluation.  On further history patient reports that he is also had significantly increased nasal congestion as well has nosebleeds.  Does not usually get nosebleeds, has not had any active bleeding from the nose but is having frequent bloody mucus.  On questioning admits that about 2 to 3 weeks ago he had intermittent gross hematuria in his urine but has not had any recently.  Reports that he does have swelling, feels that his face is swollen and does not look like it usually does, in addition feels like he has very swollen legs.  Endorses having had a rash, still mildly present on his legs but was much worse previously.  He reports he smokes about a half a pack a day.  His father had hypertension and diabetes, passed away from complications due to end-stage renal disease on dialysis.  No other family history of kidney problems.  In the ED initial vital signs unremarkable and notable for no hypertension.  Initial management and treatment focused on evaluation of headaches, he was given Benadryl, Toradol, Reglan, as well as IV fluids and sent for CT scan of the head which was unremarkable.  Baseline labs subsequently returned with unexpected results of CMP with creatinine of 4.3 and BUN of 75, sodium 130, albumin 2.7, remainder unremarkable.  CBC showed anemia with hemoglobin of 8.8 but otherwise unremarkable.  UA notable for greater than 50 RBC and WBCs as well as large amount of protein.  UDS positive for cannabinoids and tricyclics.  CT renal stone study was  obtained and showed unremarkable kidneys and a small amount of free fluid in the pelvis of unclear etiology.  Nephrology was contacted and recommended work-up for possible vasculitis.   Hospital course from Dr. 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 Pt will f/u w/ CT surg as an outpatient as per cardio. Furthermore, pt's blood cxs were growing enterococcus faecalis and pt was found to have AKI likely secondary above stated infection. Pt had kidney biopsy on 03/04/21 and prelim path shows glomerulonephritis. Cr is trending down slightly today. No HD indicated currently. For more information, please see other progress/consult notes.   4/8-c/o trazadone not helping him sleep last night. Discussed starting on melatonin, he is willing to give it a try. 4/9 no complaints this am. Slept well last night   Jerome Mccormick  ZOX:[id number] DOB: [birth year] DOA: 03/02/2021 PCP: Patient, No Pcp Per (Inactive)    Assessment & Plan:   Principal Problem:   AKI (acute kidney injury) (HCC) Active Problems:   Anemia   Cardiac murmur   Nonrheumatic mitral valve regurgitation   Mitral valve disease  Enterococcus endocarditis: Cardiology and ID following Continue IV ampicillin and ceftriaxone Hold on PICC line until cultures cleared, as he will need antibiotic treatment for 6 weeks.  If blood cultures is negative from 4/7 can place per ID.  Enterococcus faecalis bacteremia:  Sensitive to IV ampicillin  Repeat blood cultures to date negative we will follow up final  Continue ampicillin and ceftriaxone  Will need colonoscopy as outpatient , will need referral to GI  Follow-up with ID Dr. Sampson GoonFitzgerald in 1 to 2 weeks after discharge     AKI: w/ hematuria & proteinuria. Baseline Cr is 0.9.  Nephrology following  ANA, ribonucleic protein ab is  positive. S/p kidney biopsy on 03/04/21, awaiting on pathology.  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 ASO titer is normal. 4/9 -urine output is excellent  Patient may have chronic kidney disease due to NSAID usage per nephrology  We will continue to monitor his creatinine and GFR     Flail/ruptured posterior chordae : echo shows normal EF, normal diastolic function & MV w/ possible prolapse & possible flail leaflet & associated moderate MV regurgitation. 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 4/9 -likely will benefit from MVR and expedited outpatient CT surgical evaluation provided that he remains clinically stable  Cardiology following     Normocytic anemia: H&H remained stable we will continue to monitor  Currently no need for transfusion     Hyperkalemia: Resolved.  Potassium 4.9 today we will need to monitor  Hyponatremia: etiology unclear resolved.  Continue to monitor  Hx of alcohol abuse: continue on CIWA     DVT prophylaxis: SCDs Code Status: full  Family Communication: Mother updated by phone Disposition Plan: likely d/c back home   Level of care: Med-Surg   Status is: Inpatient  Remains inpatient appropriate because:Ongoing diagnostic testing needed not appropriate for outpatient work up, IV treatments appropriate due to intensity of illness or inability to take PO and Inpatient level of care appropriate due to severity of illness   Dispo: The patient is from: Home              Anticipated d/c is to: Home              Patient currently is not medically stable to d/c.   Difficult to place patient Yes        Consultants:   Cardio  nephro  CT surg    Procedures:    Antimicrobials: ampicillin, ceftriaxone     Subjective: No complaints of cp or sob. No complaints this am. Slept well last night .  Objective: Vitals:   03/08/21 2309 03/09/21 0444 03/09/21 0728 03/09/21 1112  BP: 133/85 134/76 121/71 119/75  Pulse: 74 77 82 68  Resp: 18 16 20    Temp: (!) 97.5 F (36.4 C) 98.7 F (37.1 C) 98 F (36.7 C) 97.8 F (36.6 C)  TempSrc: Oral Oral Oral Oral  SpO2: 100% 100% 99% 100%  Weight:      Height:        Intake/Output Summary (Last 24 hours) at 03/09/2021 1414 Last data filed at 03/09/2021 1300 Gross per 24 hour  Intake 1023.67 ml  Output --  Net 1023.67 ml   Filed Weights   03/02/21 1533 03/06/21 0859  Weight: 62.6 kg 59.9 kg    Examination: Calm, comfortable laying in bed CTA no wheeze rales rhonchi's  regular 3/6 holosystolic murmur Soft benign positive bowel sounds No edema Alert oriented x3 grossly intact     Data Reviewed: I have personally reviewed following labs and imaging studies  CBC: Recent Labs  Lab 03/02/21 1631 03/03/21 0552 03/04/21 0633 03/06/21 0444 03/07/21 0631 03/08/21 0425  WBC 7.6 9.5 7.0 6.3 7.7 7.9  NEUTROABS 5.7  --   --   --   --   --   HGB 8.8* 9.2* 9.0* 9.9* 8.3* 8.4*  HCT 27.5* 28.6* 28.0* 31.1* 25.8* 25.2*  MCV 88.1 86.9 87.5 85.9 86.3 85.7  PLT 309 319 295 287 292 309   Basic Metabolic Panel: Recent Labs  Lab 03/05/21 0454 03/06/21 0444 03/07/21 0631 03/08/21 0425 03/09/21 0415  NA 132* 133* 132* 137 138  K 4.8 5.9* 4.7 4.9 4.5  CL 103 102 101 105 104  CO2 23 23 22 23 24   GLUCOSE 108* 143* 111* 102* 103*  BUN 72* 79* 80* 76* 66*  CREATININE 4.22* 5.00* 4.18* 3.70* 3.38*  CALCIUM 7.7* 8.4* 7.8* 8.1* 7.9*  MG  --   --  3.1* 2.8*  --   PHOS 5.7* 7.7* 6.4* 6.5* 6.2*   GFR: Estimated Creatinine Clearance: 27.1 mL/min (A) (by C-G formula based on SCr of 3.38 mg/dL (H)). Liver Function Tests: Recent Labs  Lab 03/02/21 1631 03/04/21 05/04/21 03/05/21 0454 03/06/21 0444 03/07/21 0631 03/08/21 0425 03/09/21 0415  AST  15  --   --   --   --   --   --   ALT 12  --   --   --   --   --   --   ALKPHOS 46  --   --   --   --   --   --   BILITOT 0.4  --   --   --   --   --   --   PROT 6.5  --   --   --   --   --   --   ALBUMIN 2.7*   < > 2.3* 2.5* 2.3* 2.3* 2.3*   < > = values in this interval not displayed.   No results for input(s): LIPASE, AMYLASE in the last 168 hours. No results for input(s): AMMONIA in the last 168 hours. Coagulation Profile: Recent Labs  Lab 03/04/21 0633  INR 1.2   Cardiac Enzymes: No results for input(s): CKTOTAL, CKMB, CKMBINDEX, TROPONINI in the last 168 hours. BNP (last 3 results) No results for input(s): PROBNP in the last 8760 hours. HbA1C: No results for input(s): HGBA1C in the last 72 hours. CBG: No results for input(s): GLUCAP in the last 168 hours. Lipid Profile: No results for input(s): CHOL, HDL, LDLCALC, TRIG, CHOLHDL, LDLDIRECT in the last 72 hours. Thyroid Function Tests: No results for input(s): TSH, T4TOTAL, FREET4, T3FREE, THYROIDAB in the last 72 hours. Anemia Panel: No results for input(s): VITAMINB12, FOLATE, FERRITIN, TIBC, IRON, RETICCTPCT in the last 72 hours. Sepsis Labs: No results for input(s): PROCALCITON, LATICACIDVEN in the last 168 hours.  Recent Results (from the past 240 hour(s))  Resp Panel by RT-PCR (Flu A&B, Covid) Nasopharyngeal Swab     Status: None   Collection Time: 03/02/21  4:31 PM   Specimen: Nasopharyngeal Swab; Nasopharyngeal(NP) swabs in vial transport medium  Result Value Ref Range Status   SARS Coronavirus 2 by RT PCR NEGATIVE NEGATIVE Final    Comment: (NOTE) SARS-CoV-2 target nucleic acids are NOT DETECTED.  The SARS-CoV-2 RNA is generally detectable in upper respiratory specimens during the acute phase of infection. The lowest concentration of SARS-CoV-2 viral copies this assay can detect is 138 copies/mL. A negative result does not preclude SARS-Cov-2  infection and should not be used as the sole basis for treatment  or other patient management decisions. A negative result may occur with  improper specimen collection/handling, submission of specimen other than nasopharyngeal swab, presence of viral mutation(s) within the areas targeted by this assay, and inadequate number of viral copies(<138 copies/mL). A negative result must be combined with clinical observations, patient history, and epidemiological information. The expected result is Negative.  Fact Sheet for Patients:  BloggerCourse.com  Fact Sheet for Healthcare Providers:  SeriousBroker.it  This test is no t yet approved or cleared by the [place] FDA and  has been authorized for detection and/or diagnosis of SARS-CoV-2 by FDA under an Emergency Use Authorization (EUA). This EUA will remain  in effect (meaning this test can be used) for the duration of the COVID-19 declaration under Section 564(b)(1) of the Act, 21 U.S.C.section 360bbb-3(b)(1), unless the authorization is terminated  or revoked sooner.       Influenza A by PCR NEGATIVE NEGATIVE Final   Influenza B by PCR NEGATIVE NEGATIVE Final    Comment: (NOTE) The Xpert Xpress SARS-CoV-2/FLU/RSV plus assay is intended as an aid in the diagnosis of influenza from Nasopharyngeal swab specimens and should not be used as a sole basis for treatment. Nasal washings and aspirates are unacceptable for Xpert Xpress SARS-CoV-2/FLU/RSV testing.  Fact Sheet for Patients: BloggerCourse.com  Fact Sheet for Healthcare Providers: SeriousBroker.it  This test is not yet approved or cleared by the [place] FDA and has been authorized for detection and/or diagnosis of SARS-CoV-2 by FDA under an Emergency Use Authorization (EUA). This EUA will remain in effect (meaning this test can be used) for the duration of the COVID-19 declaration under Section 564(b)(1) of the Act, 21 U.S.C. section  360bbb-3(b)(1), unless the authorization is terminated or revoked.  Performed at [facility], [address] [place], [place] [address]   Group A Strep by PCR     Status: None   Collection Time: 03/05/21  4:46 PM   Specimen: Throat; Sterile Swab  Result Value Ref Range Status   Group A Strep by PCR NOT DETECTED NOT DETECTED Final    Comment: Performed at [facility], [address] [place], [place] [address]  Culture, blood (Routine X 2) w Reflex to ID Panel     Status: Abnormal   Collection Time: 03/05/21  5:46 PM   Specimen: BLOOD  Result Value Ref Range Status   Specimen Description   Final    BLOOD LEFT ANTECUBITAL Performed at [facility], [address], [place], [place] [address]    Special Requests   Final    BOTTLES DRAWN AEROBIC AND ANAEROBIC Blood Culture adequate volume Performed at [facility], [address] [place], [place] [address]    Culture  Setup Time   Final    GRAM POSITIVE COCCI IN BOTH AEROBIC AND ANAEROBIC BOTTLES CRITICAL VALUE NOTED.  VALUE IS CONSISTENT WITH PREVIOUSLY REPORTED AND CALLED VALUE. Performed at [facility], [address], [place], [place] [address]    Culture (A)  Final    ENTEROCOCCUS FAECALIS SUSCEPTIBILITIES PERFORMED ON PREVIOUS CULTURE WITHIN THE LAST 5 DAYS. Performed at [facility], [address], [place], [place] [address]    Report Status 03/08/2021 FINAL  Final  Culture, blood (Routine X 2) w Reflex to ID Panel     Status: Abnormal   Collection Time: 03/05/21  5:50 PM   Specimen: BLOOD  Result Value Ref  Range Status   Specimen Description   Final    BLOOD RIGHT ANTECUBITAL Performed at [facility], [address] [place], [place] [address]    Special Requests   Final    BOTTLES DRAWN AEROBIC AND ANAEROBIC Blood Culture adequate volume Performed at [facility], [address] [place], [place] [address]    Culture  Setup Time    Final    Organism ID to follow GRAM POSITIVE COCCI IN BOTH AEROBIC AND ANAEROBIC BOTTLES CRITICAL RESULT CALLED TO, READ BACK BY AND VERIFIED WITH: SUSAN WATSON AT 1146 03/06/21 SDR Performed at [facility], [address] [place], [place] [address]    Culture ENTEROCOCCUS FAECALIS (A)  Final   Report Status 03/08/2021 FINAL  Final   Organism ID, Bacteria ENTEROCOCCUS FAECALIS  Final      Susceptibility   Enterococcus faecalis - MIC*    AMPICILLIN <=2 SENSITIVE Sensitive     VANCOMYCIN 1 SENSITIVE Sensitive     GENTAMICIN SYNERGY SENSITIVE Sensitive     * ENTEROCOCCUS FAECALIS  Blood Culture ID Panel (Reflexed)     Status: Abnormal   Collection Time: 03/05/21  5:50 PM  Result Value Ref Range Status   Enterococcus faecalis DETECTED (A) NOT DETECTED Final    Comment: CRITICAL RESULT CALLED TO, READ BACK BY AND VERIFIED WITH:  SUSAN WATSON AT 1146 03/06/21 SDR    Enterococcus Faecium NOT DETECTED NOT DETECTED Final   Listeria monocytogenes NOT DETECTED NOT DETECTED Final   Staphylococcus species NOT DETECTED NOT DETECTED Final   Staphylococcus aureus (BCID) NOT DETECTED NOT DETECTED Final   Staphylococcus epidermidis NOT DETECTED NOT DETECTED Final   Staphylococcus lugdunensis NOT DETECTED NOT DETECTED Final   Streptococcus species NOT DETECTED NOT DETECTED Final   Streptococcus agalactiae NOT DETECTED NOT DETECTED Final   Streptococcus pneumoniae NOT DETECTED NOT DETECTED Final   Streptococcus pyogenes NOT DETECTED NOT DETECTED Final   A.calcoaceticus-baumannii NOT DETECTED NOT DETECTED Final   Bacteroides fragilis NOT DETECTED NOT DETECTED Final   Enterobacterales NOT DETECTED NOT DETECTED Final   Enterobacter cloacae complex NOT DETECTED NOT DETECTED Final   Escherichia coli NOT DETECTED NOT DETECTED Final   Klebsiella aerogenes NOT DETECTED NOT DETECTED Final   Klebsiella oxytoca NOT DETECTED NOT DETECTED Final   Klebsiella pneumoniae NOT DETECTED NOT DETECTED Final    Proteus species NOT DETECTED NOT DETECTED Final   Salmonella species NOT DETECTED NOT DETECTED Final   Serratia marcescens NOT DETECTED NOT DETECTED Final   Haemophilus influenzae NOT DETECTED NOT DETECTED Final   Neisseria meningitidis NOT DETECTED NOT DETECTED Final   Pseudomonas aeruginosa NOT DETECTED NOT DETECTED Final   Stenotrophomonas maltophilia NOT DETECTED NOT DETECTED Final   Candida albicans NOT DETECTED NOT DETECTED Final   Candida auris NOT DETECTED NOT DETECTED Final   Candida glabrata NOT DETECTED NOT DETECTED Final   Candida krusei NOT DETECTED NOT DETECTED Final   Candida parapsilosis NOT DETECTED NOT DETECTED Final   Candida tropicalis NOT DETECTED NOT DETECTED Final   Cryptococcus neoformans/gattii NOT DETECTED NOT DETECTED Final   Vancomycin resistance NOT DETECTED NOT DETECTED Final    Comment: Performed at [facility], [address] [place], [place] [address]  Culture, blood (single) w Reflex to ID Panel     Status: None (Preliminary result)   Collection Time: 03/07/21  6:31 AM   Specimen: BLOOD  Result Value Ref Range Status   Specimen Description BLOOD  LEFT AC  Final  Special Requests   Final    BOTTLES DRAWN AEROBIC AND ANAEROBIC Blood Culture results may not be optimal due to an excessive volume of blood received in culture bottles   Culture   Final    NO GROWTH 2 DAYS Performed at [facility], [address], [place], [place] [address]    Report Status PENDING  Incomplete         Radiology Studies: No results found.      Scheduled Meds: . folic acid  1 mg Oral Daily  . multivitamin with minerals  1 tablet Oral Daily  . nicotine  14 mg Transdermal Daily  . thiamine  100 mg Oral Daily   Or  . thiamine  100 mg Intravenous Daily   Continuous Infusions: . sodium chloride Stopped (03/09/21 0456)  . ampicillin (OMNIPEN) IV 2 g (03/09/21 1357)  . cefTRIAXone (ROCEPHIN)  IV 2 g (03/09/21 1002)  . methocarbamol  (ROBAXIN) IV       LOS: 7 days    Time spent: 35 mins with more than 50% on COC    Lynn Ito, MD [facility] Pager [phone]  If 7PM-7AM, please contact night-coverage 03/09/2021, 2:14 PM

## 2021-03-10 ENCOUNTER — Inpatient Hospital Stay: Payer: Self-pay

## 2021-03-10 LAB — RENAL FUNCTION PANEL
Albumin: 2.4 g/dL — ABNORMAL LOW (ref 3.5–5.0)
Anion gap: 6 (ref 5–15)
BUN: 57 mg/dL — ABNORMAL HIGH (ref 6–20)
CO2: 26 mmol/L (ref 22–32)
Calcium: 8.1 mg/dL — ABNORMAL LOW (ref 8.9–10.3)
Chloride: 107 mmol/L (ref 98–111)
Creatinine, Ser: 3.13 mg/dL — ABNORMAL HIGH (ref 0.61–1.24)
GFR, Estimated: 26 mL/min — ABNORMAL LOW (ref >60)
Glucose, Bld: 98 mg/dL (ref 70–99)
Phosphorus: 5.8 mg/dL — ABNORMAL HIGH (ref 2.5–4.6)
Potassium: 4.5 mmol/L (ref 3.5–5.1)
Sodium: 139 mmol/L (ref 135–145)

## 2021-03-10 NOTE — Progress Notes (Signed)
[facility]  PROGRESS NOTE   Subjective:   Feels well this morning. Ate well. Has been ambulating in the room. Waiting for PICC line   Objective:  Vital signs in last 24 hours:  Temp:  [97.5 F (36.4 C)-99.1 F (37.3 C)] 98 F (36.7 C) (04/10 0756) Pulse Rate:  [68-85] 72 (04/10 0756) Resp:  [14-20] 20 (04/10 0756) BP: (119-137)/(75-90) 123/79 (04/10 0756) SpO2:  [98 %-100 %] 98 % (04/10 0756)  Weight change:  Filed Weights   03/02/21 1533 03/06/21 0859  Weight: 62.6 kg 59.9 kg    Intake/Output: I/O last 3 completed shifts: In: 1823.8 [P.O.:720; I.V.:72.8; IV Piggyback:1030.9] Out: 0    Intake/Output this shift:  No intake/output data recorded.  Physical Exam: General:  No acute distress  Head:  Normocephalic, atraumatic. Moist oral mucosal membranes  Eyes:  Anicteric  Neck:  Supple  Lungs:   Clear to auscultation, normal effort  Heart:  S1S2 no rubs  Abdomen:   Soft, nontender, bowel sounds present  Extremities:  peripheral edema.  Neurologic:  Awake, alert, following commands  Skin:  No lesions  Access:     Basic Metabolic Panel: Recent Labs  Lab 03/06/21 0444 03/07/21 0631 03/08/21 0425 03/09/21 0415 03/10/21 0540  NA 133* 132* 137 138 139  K 5.9* 4.7 4.9 4.5 4.5  CL 102 101 105 104 107  CO2 23 22 23 24 26   GLUCOSE 143* 111* 102* 103* 98  BUN 79* 80* 76* 66* 57*  CREATININE 5.00* 4.18* 3.70* 3.38* 3.13*  CALCIUM 8.4* 7.8* 8.1* 7.9* 8.1*  MG  --  3.1* 2.8*  --   --   PHOS 7.7* 6.4* 6.5* 6.2* 5.8*    Liver Function Tests: Recent Labs  Lab 03/06/21 0444 03/07/21 0631 03/08/21 0425 03/09/21 0415 03/10/21 0540  ALBUMIN 2.5* 2.3* 2.3* 2.3* 2.4*   No results for input(s): LIPASE, AMYLASE in the last 168 hours. No results for input(s): AMMONIA in the last 168 hours.  CBC: Recent Labs  Lab 03/04/21 0633 03/06/21 0444 03/07/21 0631 03/08/21 0425  WBC 7.0 6.3 7.7 7.9  HGB 9.0* 9.9* 8.3* 8.4*  HCT 28.0* 31.1* 25.8* 25.2*   MCV 87.5 85.9 86.3 85.7  PLT 295 287 292 309    Cardiac Enzymes: No results for input(s): CKTOTAL, CKMB, CKMBINDEX, TROPONINI in the last 168 hours.  BNP: Invalid input(s): POCBNP  CBG: No results for input(s): GLUCAP in the last 168 hours.  Microbiology: Results for orders placed or performed during the hospital encounter of 03/02/21  Resp Panel by RT-PCR (Flu A&B, Covid) Nasopharyngeal Swab     Status: None   Collection Time: 03/02/21  4:31 PM   Specimen: Nasopharyngeal Swab; Nasopharyngeal(NP) swabs in vial transport medium  Result Value Ref Range Status   SARS Coronavirus 2 by RT PCR NEGATIVE NEGATIVE Final    Comment: (NOTE) SARS-CoV-2 target nucleic acids are NOT DETECTED.  The SARS-CoV-2 RNA is generally detectable in upper respiratory specimens during the acute phase of infection. The lowest concentration of SARS-CoV-2 viral copies this assay can detect is 138 copies/mL. A negative result does not preclude SARS-Cov-2 infection and should not be used as the sole basis for treatment or other patient management decisions. A negative result may occur with  improper specimen collection/handling, submission of specimen other than nasopharyngeal swab, presence of viral mutation(s) within the areas targeted by this assay, and inadequate number of viral copies(<138 copies/mL). A negative result must be combined with clinical observations, patient history,  and epidemiological information. The expected result is Negative.  Fact Sheet for Patients:  BloggerCourse.com  Fact Sheet for Healthcare Providers:  SeriousBroker.it  This test is no t yet approved or cleared by the [place] FDA and  has been authorized for detection and/or diagnosis of SARS-CoV-2 by FDA under an Emergency Use Authorization (EUA). This EUA will remain  in effect (meaning this test can be used) for the duration of the COVID-19 declaration under  Section 564(b)(1) of the Act, 21 U.S.C.section 360bbb-3(b)(1), unless the authorization is terminated  or revoked sooner.       Influenza A by PCR NEGATIVE NEGATIVE Final   Influenza B by PCR NEGATIVE NEGATIVE Final    Comment: (NOTE) The Xpert Xpress SARS-CoV-2/FLU/RSV plus assay is intended as an aid in the diagnosis of influenza from Nasopharyngeal swab specimens and should not be used as a sole basis for treatment. Nasal washings and aspirates are unacceptable for Xpert Xpress SARS-CoV-2/FLU/RSV testing.  Fact Sheet for Patients: BloggerCourse.com  Fact Sheet for Healthcare Providers: SeriousBroker.it  This test is not yet approved or cleared by the [place] FDA and has been authorized for detection and/or diagnosis of SARS-CoV-2 by FDA under an Emergency Use Authorization (EUA). This EUA will remain in effect (meaning this test can be used) for the duration of the COVID-19 declaration under Section 564(b)(1) of the Act, 21 U.S.C. section 360bbb-3(b)(1), unless the authorization is terminated or revoked.  Performed at [facility], [address] [place], [place] [address]   Group A Strep by PCR     Status: None   Collection Time: 03/05/21  4:46 PM   Specimen: Throat; Sterile Swab  Result Value Ref Range Status   Group A Strep by PCR NOT DETECTED NOT DETECTED Final    Comment: Performed at [facility], [address] [place], [place] [address]  Culture, blood (Routine X 2) w Reflex to ID Panel     Status: Abnormal   Collection Time: 03/05/21  5:46 PM   Specimen: BLOOD  Result Value Ref Range Status   Specimen Description   Final    BLOOD LEFT ANTECUBITAL Performed at [facility], [address] [place], [place] [address]    Special Requests   Final    BOTTLES DRAWN AEROBIC AND ANAEROBIC Blood Culture adequate volume Performed at [facility], [address]  [place], [place] [address]    Culture  Setup Time   Final    GRAM POSITIVE COCCI IN BOTH AEROBIC AND ANAEROBIC BOTTLES CRITICAL VALUE NOTED.  VALUE IS CONSISTENT WITH PREVIOUSLY REPORTED AND CALLED VALUE. Performed at [facility], [address] [place], [place] [address]    Culture (A)  Final    ENTEROCOCCUS FAECALIS SUSCEPTIBILITIES PERFORMED ON PREVIOUS CULTURE WITHIN THE LAST 5 DAYS. Performed at [facility], [address] [place], [place] [address]    Report Status 03/08/2021 FINAL  Final  Culture, blood (Routine X 2) w Reflex to ID Panel     Status: Abnormal   Collection Time: 03/05/21  5:50 PM   Specimen: BLOOD  Result Value Ref Range Status   Specimen Description   Final    BLOOD RIGHT ANTECUBITAL Performed at [facility], [address], [place], [place] [address]    Special Requests   Final    BOTTLES DRAWN AEROBIC AND ANAEROBIC Blood Culture adequate volume Performed at [facility], [address], [place], [place] [address]    Culture  Setup Time   Final    Organism ID to follow GRAM POSITIVE COCCI IN BOTH AEROBIC AND ANAEROBIC BOTTLES CRITICAL RESULT CALLED TO, READ BACK BY AND VERIFIED WITH: SUSAN WATSON AT 1146 03/06/21 SDR Performed at [facility], [address] [place], [place] [address]    Culture ENTEROCOCCUS FAECALIS (A)  Final   Report Status 03/08/2021 FINAL  Final   Organism ID, Bacteria ENTEROCOCCUS FAECALIS  Final      Susceptibility   Enterococcus faecalis - MIC*    AMPICILLIN <=2 SENSITIVE Sensitive     VANCOMYCIN 1 SENSITIVE Sensitive     GENTAMICIN SYNERGY SENSITIVE Sensitive     * ENTEROCOCCUS FAECALIS  Blood Culture ID Panel (Reflexed)     Status: Abnormal   Collection Time: 03/05/21  5:50 PM  Result Value Ref Range Status   Enterococcus faecalis DETECTED (A) NOT DETECTED Final    Comment: CRITICAL RESULT CALLED TO, READ BACK BY AND VERIFIED WITH:  SUSAN WATSON AT 1146 03/06/21 SDR     Enterococcus Faecium NOT DETECTED NOT DETECTED Final   Listeria monocytogenes NOT DETECTED NOT DETECTED Final   Staphylococcus species NOT DETECTED NOT DETECTED Final   Staphylococcus aureus (BCID) NOT DETECTED NOT DETECTED Final   Staphylococcus epidermidis NOT DETECTED NOT DETECTED Final   Staphylococcus lugdunensis NOT DETECTED NOT DETECTED Final   Streptococcus species NOT DETECTED NOT DETECTED Final   Streptococcus agalactiae NOT DETECTED NOT DETECTED Final   Streptococcus pneumoniae NOT DETECTED NOT DETECTED Final   Streptococcus pyogenes NOT DETECTED NOT DETECTED Final   A.calcoaceticus-baumannii NOT DETECTED NOT DETECTED Final   Bacteroides fragilis NOT DETECTED NOT DETECTED Final   Enterobacterales NOT DETECTED NOT DETECTED Final   Enterobacter cloacae complex NOT DETECTED NOT DETECTED Final   Escherichia coli NOT DETECTED NOT DETECTED Final   Klebsiella aerogenes NOT DETECTED NOT DETECTED Final   Klebsiella oxytoca NOT DETECTED NOT DETECTED Final   Klebsiella pneumoniae NOT DETECTED NOT DETECTED Final   Proteus species NOT DETECTED NOT DETECTED Final   Salmonella species NOT DETECTED NOT DETECTED Final   Serratia marcescens NOT DETECTED NOT DETECTED Final   Haemophilus influenzae NOT DETECTED NOT DETECTED Final   Neisseria meningitidis NOT DETECTED NOT DETECTED Final   Pseudomonas aeruginosa NOT DETECTED NOT DETECTED Final   Stenotrophomonas maltophilia NOT DETECTED NOT DETECTED Final   Candida albicans NOT DETECTED NOT DETECTED Final   Candida auris NOT DETECTED NOT DETECTED Final   Candida glabrata NOT DETECTED NOT DETECTED Final   Candida krusei NOT DETECTED NOT DETECTED Final   Candida parapsilosis NOT DETECTED NOT DETECTED Final   Candida tropicalis NOT DETECTED NOT DETECTED Final   Cryptococcus neoformans/gattii NOT DETECTED NOT DETECTED Final   Vancomycin resistance NOT DETECTED NOT DETECTED Final    Comment: Performed at [facility], [address]  [place], [place] [address]  Culture, blood (single) w Reflex to ID Panel     Status: None (Preliminary result)   Collection Time: 03/07/21  6:31 AM   Specimen: BLOOD  Result Value Ref Range Status   Specimen Description BLOOD  LEFT AC  Final   Special Requests   Final    BOTTLES DRAWN AEROBIC AND ANAEROBIC Blood Culture results may not be optimal due to an excessive volume of blood received in culture bottles   Culture   Final    NO GROWTH 3 DAYS Performed at [facility], [address] [place], [place] [address]    Report Status PENDING  Incomplete  CULTURE, BLOOD (ROUTINE X 2) w Reflex to ID Panel     Status: None (Preliminary result)   Collection Time: 03/09/21  9:44 AM   Specimen: BLOOD  Result Value Ref Range Status   Specimen Description BLOOD LEFT ANTECUBITAL  Final   Special Requests   Final    BOTTLES DRAWN AEROBIC AND ANAEROBIC Blood Culture results may not be optimal due to an excessive volume of blood received in culture bottles   Culture   Final    NO GROWTH < 24 HOURS Performed at [facility], [address], [place], [place] [address]    Report Status PENDING  Incomplete  CULTURE, BLOOD (ROUTINE X 2) w Reflex to ID Panel     Status: None (Preliminary result)   Collection Time: 03/09/21  9:46 AM   Specimen: BLOOD  Result Value Ref Range Status   Specimen Description BLOOD RIGHT ARM  Final   Special Requests   Final    BOTTLES DRAWN AEROBIC AND ANAEROBIC Blood Culture adequate volume   Culture   Final    NO GROWTH < 24 HOURS Performed at [facility], [address] [place], [place] [address]    Report Status PENDING  Incomplete    Coagulation Studies: No results for input(s): LABPROT, INR in the last 72 hours.  Urinalysis: No results for input(s): COLORURINE, LABSPEC, PHURINE, GLUCOSEU, HGBUR, BILIRUBINUR, KETONESUR, PROTEINUR, UROBILINOGEN, NITRITE, LEUKOCYTESUR in the last 72 hours.  Invalid input(s): APPERANCEUR     Imaging: No results found.   Medications:   . sodium chloride Stopped (03/09/21 2259)  . ampicillin (OMNIPEN) IV 2 g (03/10/21 0534)  . cefTRIAXone (ROCEPHIN)  IV 2 g (03/10/21 0945)  . methocarbamol (ROBAXIN) IV     . folic acid  1 mg Oral Daily  . multivitamin with minerals  1 tablet Oral Daily  . nicotine  14 mg Transdermal Daily  . thiamine  100 mg Oral Daily   Or  . thiamine  100 mg Intravenous Daily    Assessment/ Plan:     Principal Problem:   AKI (acute kidney injury) (HCC) Active Problems:   Anemia   Cardiac murmur   Nonrheumatic mitral valve regurgitation   Mitral valve disease  30 y.o. male significant use of nonsteroidal anti-inflammatory drugs.  He was admitted to the hospital with history of severe headaches.   He is now found to have acute kidney injury and also enterococcal bacteremia.   #1 acute kidney injury associated with acute nephrotic syndrome.  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  Mostly neutrophilic infiltrate.  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 ASO titer is normal.  Influenza, SARS coronavirus, hepatitis B studies, hepatitis C studies, HIV are negative. Blood culture Positive for enterococcus faecalison IV antibiotics  Renal indices are presently stable and urine output is excellent as per patient. Patient is scheduled for PICC line for continuous antibiotic infusion. Patient may have chronic kidney disease due to NSAIDs usage. We will continue to monitor daily renal profile. Medications are appropriate at this time for the patient's GFR.    LOS: 8 Lorain Childes, MD [facility] 4/10/202210:15 AM

## 2021-03-10 NOTE — Progress Notes (Signed)
PROGRESS NOTE    HPI was taken from Dr. Crissie Reese:  Jerome Mccormick is a 30 y.o. male with no past medical history who presents for headaches.  Patient reports that for about the past week he has been having significant migraines.  He has had migraines in the past however these have persisted for longer than he usually after prior.  He has also had associated nausea and vomiting.  His mother eventually convinced him to present to the ED for evaluation.  On further history patient reports that he is also had significantly increased nasal congestion as well has nosebleeds.  Does not usually get nosebleeds, has not had any active bleeding from the nose but is having frequent bloody mucus.  On questioning admits that about 2 to 3 weeks ago he had intermittent gross hematuria in his urine but has not had any recently.  Reports that he does have swelling, feels that his face is swollen and does not look like it usually does, in addition feels like he has very swollen legs.  Endorses having had a rash, still mildly present on his legs but was much worse previously.  He reports he smokes about a half a pack a day.  His father had hypertension and diabetes, passed away from complications due to end-stage renal disease on dialysis.  No other family history of kidney problems.  In the ED initial vital signs unremarkable and notable for no hypertension.  Initial management and treatment focused on evaluation of headaches, he was given Benadryl, Toradol, Reglan, as well as IV fluids and sent for CT scan of the head which was unremarkable.  Baseline labs subsequently returned with unexpected results of CMP with creatinine of 4.3 and BUN of 75, sodium 130, albumin 2.7, remainder unremarkable.  CBC showed anemia with hemoglobin of 8.8 but otherwise unremarkable.  UA notable for greater than 50 RBC and WBCs as well as large amount of protein.  UDS positive for cannabinoids and tricyclics.  CT renal stone study was  obtained and showed unremarkable kidneys and a small amount of free fluid in the pelvis of unclear etiology.  Nephrology was contacted and recommended work-up for possible vasculitis.   Hospital course from Dr. 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 Pt will f/u w/ CT surg as an outpatient as per cardio. Furthermore, pt's blood cxs were growing enterococcus faecalis and pt was found to have AKI likely secondary above stated infection. Pt had kidney biopsy on 03/04/21 and prelim path shows glomerulonephritis. Cr is trending down slightly today. No HD indicated currently. For more information, please see other progress/consult notes.    4/8-4/10- no issues. Creatinine improving. bcx pending and so far negative. PICC order for am.    Jerome Mccormick  BWL:[id number] DOB: May 05, 1991 DOA: 03/02/2021 PCP: Patient, No Pcp Per (Inactive)    Assessment & Plan:   Principal Problem:   AKI (acute kidney injury) (HCC) Active Problems:   Anemia   Cardiac murmur   Nonrheumatic mitral valve regurgitation   Mitral valve disease  Enterococcus endocarditis: Cardiology and ID following Continue IV ampicillin and ceftriaxone 4/10-blood cultures to date negative we will follow up on cultures from yesterday.   We will order PICC line for a.m. if continues to be negative as he will need antibiotics for 6 weeks  Per ID if blood cultures from 4/7 -  can place PICC .     Enterococcus faecalis bacteremia:  Continue with IV ceftriaxone and ampicillin  Repeat blood cultures today negative we will follow up on final  Will need colonoscopy as outpatient, will need referral to GI  Follow-up with ID Dr. Sampson Goon in 1 to 2 weeks after discharge     AKI: w/ hematuria & proteinuria. Baseline Cr is 0.9.  Nephrology following  ANA, ribonucleic protein ab is  positive. S/p kidney biopsy on 03/04/21, awaiting on pathology.  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 ASO titer is normal. 4/10-creatinine improving, 3.13 today  Good urine output reported  4/9 -urine output is excellent  Per nephrology patient may have chronic kidney disease due to NSAID usage  Avoid nephrotoxic medication  Continue to  follow renal function       Flail/ruptured posterior chordae : echo shows normal EF, normal diastolic function & MV w/ possible prolapse & possible flail leaflet & associated moderate MV regurgitation. 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 4/10-likely will benefit from MVR and expedited outpatient CT surgical evaluation provided he remains clinically stable  Cardiology following       Normocytic anemia: H&H remained stable we will continue to monitor  Currently no need for transfusion     Hyperkalemia: Resolved.    Hyponatremia: resolved  Hx of alcohol abuse: continue on CIWA     DVT prophylaxis: SCDs Code Status: full  Family Communication: Mother updated by phone Disposition Plan: likely d/c back home   Level of care: Med-Surg   Status is: Inpatient  Remains inpatient appropriate because:Ongoing diagnostic testing needed not appropriate for outpatient work up, IV treatments appropriate due to intensity of illness or inability to take PO and Inpatient level of care appropriate due to severity of illness   Dispo: The patient is from: Home              Anticipated d/c is to: Home              Patient currently is not medically stable to d/c.   Difficult to place patient Yes        Consultants:   Cardio  nephro  CT surg    Procedures:    Antimicrobials: ampicillin, ceftriaxone     Subjective: No complaints today.  Sleeping well.  Denies any shortness of breath or chest pain.  Objective: Vitals:   03/09/21 2259 03/10/21 0447 03/10/21 0756 03/10/21 1102  BP: 126/77 135/78 123/79 125/81  Pulse: 85 85 72 72  Resp: Temp: 98.3 F (36.8 C) 99.1 F (37.3 C) 98 F (36.7 C) 97.8 F (36.6 C)  TempSrc: Oral Oral Oral Oral  SpO2: 100% 100% 98% 100%  Weight:      Height:        Intake/Output Summary (Last 24 hours) at 03/10/2021 1258 Last data filed at 03/10/2021 0900 Gross per 24 hour  Intake 1040.09 ml  Output 0 ml  Net 1040.09 ml   Filed Weights   03/02/21 1533 03/06/21 0859  Weight: 62.6 kg 59.9 kg    Examination: Calm, nad  cta no w/r/r Regular s1/s2 no gallop 3/6 HSM Soft benign +bs No edema Awake and alert x3, grossly intact Mood and affect appropriate in current setting    Data Reviewed: I have personally  reviewed following labs and imaging studies  CBC: Recent Labs  Lab 03/04/21 0633 03/06/21 0444 03/07/21 0631 03/08/21 0425  WBC 7.0 6.3 7.7 7.9  HGB 9.0* 9.9* 8.3* 8.4*  HCT 28.0* 31.1* 25.8* 25.2*  MCV 87.5 85.9 86.3 85.7  PLT 295 287 292 309   Basic Metabolic Panel: Recent Labs  Lab 03/06/21 0444 03/07/21 0631 03/08/21 0425 03/09/21 0415 03/10/21 0540  NA 133* 132* 137 138 139  K 5.9* 4.7 4.9 4.5 4.5  CL 102 101 105 104 107  CO2 23 22 23 24 26   GLUCOSE 143* 111* 102* 103* 98  BUN 79* 80* 76* 66* 57*  CREATININE 5.00* 4.18* 3.70* 3.38* 3.13*  CALCIUM 8.4* 7.8* 8.1* 7.9* 8.1*  MG  --  3.1* 2.8*  --   --   PHOS 7.7* 6.4* 6.5* 6.2* 5.8*   GFR: Estimated Creatinine Clearance: 29.2 mL/min (A) (by C-G formula based on SCr of 3.13 mg/dL (H)). Liver Function Tests: Recent Labs  Lab 03/06/21 0444 03/07/21 0631 03/08/21 0425 03/09/21 0415 03/10/21 0540  ALBUMIN 2.5* 2.3* 2.3* 2.3* 2.4*   No results for input(s): LIPASE, AMYLASE in the last 168 hours. No results for input(s): AMMONIA in the last 168  hours. Coagulation Profile: Recent Labs  Lab 03/04/21 0633  INR 1.2   Cardiac Enzymes: No results for input(s): CKTOTAL, CKMB, CKMBINDEX, TROPONINI in the last 168 hours. BNP (last 3 results) No results for input(s): PROBNP in the last 8760 hours. HbA1C: No results for input(s): HGBA1C in the last 72 hours. CBG: No results for input(s): GLUCAP in the last 168 hours. Lipid Profile: No results for input(s): CHOL, HDL, LDLCALC, TRIG, CHOLHDL, LDLDIRECT in the last 72 hours. Thyroid Function Tests: No results for input(s): TSH, T4TOTAL, FREET4, T3FREE, THYROIDAB in the last 72 hours. Anemia Panel: No results for input(s): VITAMINB12, FOLATE, FERRITIN, TIBC, IRON, RETICCTPCT in the last 72 hours. Sepsis Labs: No results for input(s): PROCALCITON, LATICACIDVEN in the last 168 hours.  Recent Results (from the past 240 hour(s))  Resp Panel by RT-PCR (Flu A&B, Covid) Nasopharyngeal Swab     Status: None   Collection Time: 03/02/21  4:31 PM   Specimen: Nasopharyngeal Swab; Nasopharyngeal(NP) swabs in vial transport medium  Result Value Ref Range Status   SARS Coronavirus 2 by RT PCR NEGATIVE NEGATIVE Final    Comment: (NOTE) SARS-CoV-2 target nucleic acids are NOT DETECTED.  The SARS-CoV-2 RNA is generally detectable in upper respiratory specimens during the acute phase of infection. The lowest concentration of SARS-CoV-2 viral copies this assay can detect is 138 copies/mL. A negative result does not preclude SARS-Cov-2 infection and should not be used as the sole basis for treatment or other patient management decisions. A negative result may occur with  improper specimen collection/handling, submission of specimen other than nasopharyngeal swab, presence of viral mutation(s) within the areas targeted by this assay, and inadequate number of viral copies(<138 copies/mL). A negative result must be combined with clinical observations, patient history, and epidemiological information.  The expected result is Negative.  Fact Sheet for Patients:  05/02/21  Fact Sheet for Healthcare Providers:  BloggerCourse.com  This test is no t yet approved or cleared by the [place] FDA and  has been authorized for detection and/or diagnosis of SARS-CoV-2 by FDA under an Emergency Use Authorization (EUA). This EUA will remain  in effect (meaning this test can be used) for the duration of the COVID-19 declaration under Section 564(b)(1) of the Act, 21 U.S.C.section 360bbb-3(b)(1),  unless the authorization is terminated  or revoked sooner.       Influenza A by PCR NEGATIVE NEGATIVE Final   Influenza B by PCR NEGATIVE NEGATIVE Final    Comment: (NOTE) The Xpert Xpress SARS-CoV-2/FLU/RSV plus assay is intended as an aid in the diagnosis of influenza from Nasopharyngeal swab specimens and should not be used as a sole basis for treatment. Nasal washings and aspirates are unacceptable for Xpert Xpress SARS-CoV-2/FLU/RSV testing.  Fact Sheet for Patients: BloggerCourse.comhttps://www.fda.gov/media/152166/download  Fact Sheet for Healthcare Providers: SeriousBroker.ithttps://www.fda.gov/media/152162/download  This test is not yet approved or cleared by the [place] FDA and has been authorized for detection and/or diagnosis of SARS-CoV-2 by FDA under an Emergency Use Authorization (EUA). This EUA will remain in effect (meaning this test can be used) for the duration of the COVID-19 declaration under Section 564(b)(1) of the Act, 21 U.S.C. section 360bbb-3(b)(1), unless the authorization is terminated or revoked.  Performed at [facility], [address] [place], [place] [address]   Group A Strep by PCR     Status: None   Collection Time: 03/05/21  4:46 PM   Specimen: Throat; Sterile Swab  Result Value Ref Range Status   Group A Strep by PCR NOT DETECTED NOT DETECTED Final    Comment: Performed at [facility], [address] [place], [place] [address]  Culture, blood (Routine X 2) w Reflex to ID Panel     Status: Abnormal   Collection Time: 03/05/21  5:46 PM   Specimen: BLOOD  Result Value Ref Range Status   Specimen Description   Final    BLOOD LEFT ANTECUBITAL Performed at [facility], [address] [place], [place] [address]    Special Requests   Final    BOTTLES DRAWN AEROBIC AND ANAEROBIC Blood Culture adequate volume Performed at [facility], [address] [place], [place] [address]    Culture  Setup Time   Final    GRAM POSITIVE COCCI IN BOTH AEROBIC AND ANAEROBIC BOTTLES CRITICAL VALUE NOTED.  VALUE IS CONSISTENT WITH PREVIOUSLY REPORTED AND CALLED VALUE. Performed at [facility], [address] [place], [place] [address]    Culture (A)  Final    ENTEROCOCCUS FAECALIS SUSCEPTIBILITIES PERFORMED ON PREVIOUS CULTURE WITHIN THE LAST 5 DAYS. Performed at [facility], [address] [place], [place] [address]    Report Status 03/08/2021 FINAL  Final  Culture, blood (Routine X 2) w Reflex to ID Panel     Status: Abnormal   Collection Time: 03/05/21  5:50 PM   Specimen: BLOOD  Result Value Ref Range Status   Specimen Description   Final    BLOOD RIGHT ANTECUBITAL Performed at [facility], [address] [place], [place] [address]    Special Requests   Final    BOTTLES DRAWN AEROBIC AND ANAEROBIC Blood Culture adequate volume Performed at [facility], [address] [place], [place] [address]    Culture  Setup Time   Final    Organism ID to follow GRAM POSITIVE COCCI IN BOTH AEROBIC AND ANAEROBIC BOTTLES CRITICAL RESULT CALLED TO, READ BACK BY AND VERIFIED WITH: Algie CofferSUSAN WATSON AT 1146 03/06/21 SDR Performed at [facility], [address] [place], [place] [address]    Culture ENTEROCOCCUS FAECALIS (A)  Final   Report Status 03/08/2021 FINAL  Final   Organism ID, Bacteria ENTEROCOCCUS FAECALIS  Final       Susceptibility   Enterococcus faecalis -  MIC*    AMPICILLIN <=2 SENSITIVE Sensitive     VANCOMYCIN 1 SENSITIVE Sensitive     GENTAMICIN SYNERGY SENSITIVE Sensitive     * ENTEROCOCCUS FAECALIS  Blood Culture ID Panel (Reflexed)     Status: Abnormal   Collection Time: 03/05/21  5:50 PM  Result Value Ref Range Status   Enterococcus faecalis DETECTED (A) NOT DETECTED Final    Comment: CRITICAL RESULT CALLED TO, READ BACK BY AND VERIFIED WITH:  SUSAN WATSON AT 1146 03/06/21 SDR    Enterococcus Faecium NOT DETECTED NOT DETECTED Final   Listeria monocytogenes NOT DETECTED NOT DETECTED Final   Staphylococcus species NOT DETECTED NOT DETECTED Final   Staphylococcus aureus (BCID) NOT DETECTED NOT DETECTED Final   Staphylococcus epidermidis NOT DETECTED NOT DETECTED Final   Staphylococcus lugdunensis NOT DETECTED NOT DETECTED Final   Streptococcus species NOT DETECTED NOT DETECTED Final   Streptococcus agalactiae NOT DETECTED NOT DETECTED Final   Streptococcus pneumoniae NOT DETECTED NOT DETECTED Final   Streptococcus pyogenes NOT DETECTED NOT DETECTED Final   A.calcoaceticus-baumannii NOT DETECTED NOT DETECTED Final   Bacteroides fragilis NOT DETECTED NOT DETECTED Final   Enterobacterales NOT DETECTED NOT DETECTED Final   Enterobacter cloacae complex NOT DETECTED NOT DETECTED Final   Escherichia coli NOT DETECTED NOT DETECTED Final   Klebsiella aerogenes NOT DETECTED NOT DETECTED Final   Klebsiella oxytoca NOT DETECTED NOT DETECTED Final   Klebsiella pneumoniae NOT DETECTED NOT DETECTED Final   Proteus species NOT DETECTED NOT DETECTED Final   Salmonella species NOT DETECTED NOT DETECTED Final   Serratia marcescens NOT DETECTED NOT DETECTED Final   Haemophilus influenzae NOT DETECTED NOT DETECTED Final   Neisseria meningitidis NOT DETECTED NOT DETECTED Final   Pseudomonas aeruginosa NOT DETECTED NOT DETECTED Final   Stenotrophomonas maltophilia NOT DETECTED NOT DETECTED Final   Candida  albicans NOT DETECTED NOT DETECTED Final   Candida auris NOT DETECTED NOT DETECTED Final   Candida glabrata NOT DETECTED NOT DETECTED Final   Candida krusei NOT DETECTED NOT DETECTED Final   Candida parapsilosis NOT DETECTED NOT DETECTED Final   Candida tropicalis NOT DETECTED NOT DETECTED Final   Cryptococcus neoformans/gattii NOT DETECTED NOT DETECTED Final   Vancomycin resistance NOT DETECTED NOT DETECTED Final    Comment: Performed at [facility], [address] [place], [place] [address]  Culture, blood (single) w Reflex to ID Panel     Status: None (Preliminary result)   Collection Time: 03/07/21  6:31 AM   Specimen: BLOOD  Result Value Ref Range Status   Specimen Description BLOOD  LEFT AC  Final   Special Requests   Final    BOTTLES DRAWN AEROBIC AND ANAEROBIC Blood Culture results may not be optimal due to an excessive volume of blood received in culture bottles   Culture   Final    NO GROWTH 3 DAYS Performed at [facility], [address] [place], [place] [address]    Report Status PENDING  Incomplete  CULTURE, BLOOD (ROUTINE X 2) w Reflex to ID Panel     Status: None (Preliminary result)   Collection Time: 03/09/21  9:44 AM   Specimen: BLOOD  Result Value Ref Range Status   Specimen Description BLOOD LEFT ANTECUBITAL  Final   Special Requests   Final    BOTTLES DRAWN AEROBIC AND ANAEROBIC Blood Culture results may not be optimal due to an excessive volume of blood received in culture bottles   Culture   Final  NO GROWTH < 24 HOURS Performed at [facility], [address] [place], [place] [address]    Report Status PENDING  Incomplete  CULTURE, BLOOD (ROUTINE X 2) w Reflex to ID Panel     Status: None (Preliminary result)   Collection Time: 03/09/21  9:46 AM   Specimen: BLOOD  Result Value Ref Range Status   Specimen Description BLOOD RIGHT ARM  Final   Special Requests   Final    BOTTLES DRAWN AEROBIC AND ANAEROBIC Blood Culture  adequate volume   Culture   Final    NO GROWTH < 24 HOURS Performed at [facility], [address] [place], [place] [address]    Report Status PENDING  Incomplete         Radiology Studies: No results found.      Scheduled Meds: . folic acid  1 mg Oral Daily  . multivitamin with minerals  1 tablet Oral Daily  . nicotine  14 mg Transdermal Daily  . thiamine  100 mg Oral Daily   Or  . thiamine  100 mg Intravenous Daily   Continuous Infusions: . sodium chloride Stopped (03/09/21 2259)  . ampicillin (OMNIPEN) IV 2 g (03/10/21 0534)  . cefTRIAXone (ROCEPHIN)  IV 2 g (03/10/21 0945)  . methocarbamol (ROBAXIN) IV       LOS: 8 days    Time spent: 35 mins with more than 50% on COC    Lynn Ito, MD [facility] Pager [phone]  If 7PM-7AM, please contact night-coverage 03/10/2021, 12:58 PM

## 2021-03-11 DIAGNOSIS — I059 Rheumatic mitral valve disease, unspecified: Secondary | ICD-10-CM

## 2021-03-11 LAB — RENAL FUNCTION PANEL
Albumin: 2.4 g/dL — ABNORMAL LOW (ref 3.5–5.0)
Anion gap: 8 (ref 5–15)
BUN: 52 mg/dL — ABNORMAL HIGH (ref 6–20)
CO2: 25 mmol/L (ref 22–32)
Calcium: 8 mg/dL — ABNORMAL LOW (ref 8.9–10.3)
Chloride: 106 mmol/L (ref 98–111)
Creatinine, Ser: 2.95 mg/dL — ABNORMAL HIGH (ref 0.61–1.24)
GFR, Estimated: 28 mL/min — ABNORMAL LOW (ref >60)
Glucose, Bld: 89 mg/dL (ref 70–99)
Phosphorus: 5.5 mg/dL — ABNORMAL HIGH (ref 2.5–4.6)
Potassium: 4.6 mmol/L (ref 3.5–5.1)
Sodium: 139 mmol/L (ref 135–145)

## 2021-03-11 LAB — CBC
HCT: 26.3 % — ABNORMAL LOW (ref 39.0–52.0)
Hemoglobin: 8.4 g/dL — ABNORMAL LOW (ref 13.0–17.0)
MCH: 28 pg (ref 26.0–34.0)
MCHC: 31.9 g/dL (ref 30.0–36.0)
MCV: 87.7 fL (ref 80.0–100.0)
Platelets: 346 10*3/uL (ref 150–400)
RBC: 3 MIL/uL — ABNORMAL LOW (ref 4.22–5.81)
RDW: 13.6 % (ref 11.5–15.5)
WBC: 7.4 10*3/uL (ref 4.0–10.5)
nRBC: 0 % (ref 0.0–0.2)

## 2021-03-11 LAB — PROTIME-INR
INR: 1.3 — ABNORMAL HIGH (ref 0.8–1.2)
Prothrombin Time: 15.2 seconds (ref 11.4–15.2)

## 2021-03-11 MED ORDER — SODIUM CHLORIDE 0.9% FLUSH: 10.0000 mL | Freq: Two times a day (BID) | INTRAVENOUS | Status: DC

## 2021-03-11 MED ORDER — AMPICILLIN IV (FOR PTA / DISCHARGE USE ONLY): 8.0000 g | INTRAVENOUS | 0 refills | Status: DC

## 2021-03-11 MED ORDER — SODIUM CHLORIDE 0.9 % IV SOLN
2.0000 g | Freq: Four times a day (QID) | INTRAVENOUS | Status: DC
  Administered 2021-03-11: 2 g via INTRAVENOUS
  Filled 2021-03-11 (×3): qty 2000

## 2021-03-11 MED ORDER — SODIUM CHLORIDE 0.9% FLUSH: 10.0000 mL | INTRAVENOUS | Status: DC | PRN

## 2021-03-11 MED ORDER — ACETAMINOPHEN 325 MG PO TABS
650.0000 mg | ORAL_TABLET | Freq: Four times a day (QID) | ORAL | Status: AC | PRN
Start: 2021-03-11 — End: ?

## 2021-03-11 MED ORDER — CHLORHEXIDINE GLUCONATE CLOTH 2 % EX PADS: 6.0000 | MEDICATED_PAD | Freq: Every day | CUTANEOUS | Status: DC

## 2021-03-11 MED ORDER — CEFTRIAXONE IV (FOR PTA / DISCHARGE USE ONLY): 2.0000 g | Freq: Two times a day (BID) | INTRAVENOUS | 0 refills | Status: DC

## 2021-03-11 NOTE — Discharge Summary (Signed)
Physician Discharge Summary  Jerome Mccormick MEQ:[id number] DOB: [birth year] DOA: 03/02/2021  PCP: Patient, No Pcp Per (Inactive)  Admit date: 03/02/2021 Discharge date: 03/11/2021  Admitted From: home  Disposition:  Home w/ home health   Recommendations for Outpatient Follow-up:  1. Follow up with PCP in 1-2 weeks 2. F/u w/ CT surg, Dr. Roxy Manns on 03/20/21 at 4pm 3. F/u nephro, Dr. Candiss Norse, on 03/19/21 at 11:30 am 4. F/u ID, Dr. Ola Spurr, in 2 weeks  5. F/u GI in 2-3 weeks, will need colonoscopy   Home Health: yes Equipment/Devices: PICC line for long term abx  Discharge Condition: stable  CODE STATUS: full  Diet recommendation: Regular   Brief/Interim Summary:  HPI was taken from Dr. Dione Plover:  Jerome Mccormick a 30 y.o.malewith no past medical history who presents for headaches.  Patient reports that for about the past week he has been having significant migraines. He has had migraines in the past however these have persisted for longer than he usually after prior. He has also had associated nausea and vomiting.His mother eventually convinced him to present to the ED for evaluation.  On further history patient reports that he is also had significantly increased nasal congestion as well has nosebleeds. Does not usually get nosebleeds, has not had any active bleeding from the nose but is having frequent bloody mucus. On questioning admits that about 2 to 3 weeks ago he had intermittent gross hematuria in his urine but has not had any recently. Reports that he does have swelling, feels that his face is swollen and does not look like it usually does, in addition feels like he has very swollen legs. Endorses having had a rash, still mildly present on his legs but was much worse previously. He reports he smokes about a half a pack a day. His father had hypertension and diabetes, passed away from complications due to end-stage renal disease on dialysis. No other family history of kidney  problems.  In the EDinitial vital signs unremarkable and notable for no hypertension. Initial management and treatment focused on evaluation of headaches, he was given Benadryl, Toradol, Reglan, as well as IV fluids and sent for CT scan of the head which was unremarkable. Baseline labs subsequently returned with unexpected results of CMP with creatinine of 4.3 and BUN of 75, sodium 130, albumin 2.7, remainder unremarkable. CBC showed anemia with hemoglobin of 8.8 but otherwise unremarkable. UA notable for greater than 50 RBC and WBCs as well as large amount of protein. UDS positive for cannabinoids and tricyclics. CT renal stone study was obtained and showed unremarkable kidneys and a small amount of free fluid in the pelvis of unclear etiology. Nephrology was contacted and recommended work-up for possible vasculitis.   Hospital course from Dr. 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 Pt will f/u w/ CT surg as an outpatient as per cardio. Furthermore, pt's blood cxs were growing enterococcus faecalis and pt was found to have AKI likely secondary above stated infection. Pt had kidney biopsy on 03/04/21 and path showed postinfectious glomerulonephritis. Cr is trending down slightly today. No HD indicated currently. Pt did receive a PICC line prior d/c for long term abxs. Pt will go w/ ceftriaxone, ampicillin until 04/16/21 as per ID.   For more information, please see other progress/consult notes.   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 Repeat blood cxs NGTD  Bacteremia: blood cx growing enterococcus  faecalis, sens pending still. Continue on IV abxs. Will need colonoscopy outpatient w/ GI as per ID   AKI: w/ hematuria & proteinuria. Baseline Cr is 0.9. Cr is trending down slightly today. ANA, ribonucleic protein ab is positive. S/p kidney biopsy on 03/04/21, shows acute postinfectious glomerulonephritis. May have CKD secondary to NSAID use as per nephro. Nephro following and recs apprec  Flail/ruptured posterior chordae : echo shows normal EF, normal diastolic function & MV w/ possible prolapse & possible flail leaflet & associated moderate MV regurgitation. 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 Pt will f/u w/ CT surg as an outpatient as per cardio for mitral valve repair/replacement . Cardio following and recs apprec  Normocytic anemia: H&H are labile. No need for a transfusion currently   Hyperkalemia: resolved   Hyponatremia: etiology unclear, labile. Will continue to monitor   Hx of alcohol abuse: continue on CIWA   Discharge Instructions  Discharge Instructions    [facility] pharmacist to adjust dose for Vancomycin, Aminoglycosides and other anti-infective therapies as requested by physician.   Complete by: As directed    [facility] to provide Cath Flo 34m   Complete by: As directed    Administer for PICC line occlusion and as ordered by physician for other access device issues.   Anaphylaxis Kit: Provided to treat any anaphylactic reaction to the medication being provided to the patient if First Dose or when requested by physician   Complete by: As directed    Epinephrine 152mml vial / amp: Administer 0.24m724m0.24ml224mubcutaneously once for moderate to severe anaphylaxis, nurse to call physician and pharmacy when reaction occurs and call 911 if needed for immediate care   Diphenhydramine 50mg65mIV vial: Administer 25-50mg 40mM PRN for first dose reaction, rash, itching, mild reaction, nurse to call physician and  pharmacy when reaction occurs   Sodium Chloride 0.9% NS 500ml I6mdminister if needed for hypovolemic blood pressure drop or as ordered by physician after call to physician with anaphylactic reaction   Change dressing on IV access line weekly and PRN   Complete by: As directed    Diet general   Complete by: As directed    Discharge instructions   Complete by: As directed    F/u w/ CT surg, Dr. Owen 4/Roxy Manns2 at 4pm. F/u nephro, Dr. Singh oCandiss Norse9/22 at 11:30 am. F/u w/ ID, Dr. FitzgerOla Spurreeks. F/u w/ PCP in 1-2 weeks   Flush IV access with Sodium Chloride 0.9% and Heparin 10 units/ml or 100 units/ml   Complete by: As directed    Home infusion instructions - [facility]   Complete by: As directed    Instructions: Flush IV access with Sodium Chloride 0.9% and Heparin 10units/ml or 100units/ml   Change dressing on IV access line: Weekly and PRN   Instructions Cath Flo 2mg: Ad46mister for PICC Line occlusion and as ordered by physician for other access device   [facility] pharmacist to adjust dose for: Vancomycin, Aminoglycosides and other anti-infective therapies as requested by physician   Increase activity slowly   Complete by: As directed    Method of administration may be changed at the discretion of home  infusion pharmacist based upon assessment of the patient and/or caregiver's ability to self-administer the medication ordered   Complete by: As directed    No wound care   Complete by: As directed      Allergies as of 03/11/2021   No Known Allergies     Medication List    STOP taking these medications   ibuprofen 200 MG tablet Commonly known as: ADVIL     TAKE these medications   acetaminophen 325 MG tablet Commonly known as: TYLENOL Take 2 tablets (650 mg total) by mouth every 6 (six) hours as needed for mild pain, moderate pain, fever or headache (or Fever >/= 101).   ampicillin  IVPB Inject 8 g into the vein continuous. Infuse ampicillin 8gm  daily over 24h as continuous infusion Indication: Enterococcal endocarditis First Dose: Yes Last Day of Therapy:  04/16/2021 Labs - Once weekly:  CBC/D, CMP, and CRP  Method of administration: Ambulatory Pump (Continuous Infusion) Method of administration may be changed at the discretion of home infusion pharmacist based upon assessment of the patient and/or caregiver's ability to self-administer the medication ordered.   cefTRIAXone  IVPB Commonly known as: ROCEPHIN Inject 2 g into the vein every 12 (twelve) hours. Indication: Enterococcal endocarditis First Dose: Yes Last Day of Therapy:  04/16/2021 Labs - Once weekly:  CBC/D, CMP, and CRP  Method of administration: IV Push Method of administration may be changed at the discretion of home infusion pharmacist based upon assessment of the patient and/or caregiver's ability to self-administer the medication ordered.            Discharge Care Instructions  (From admission, onward)         Start     Ordered   03/11/21 0000  Change dressing on IV access line weekly and PRN  (Home infusion instructions - [facility] )        03/11/21 1243          Follow-up Information    Bunnie Pion, FNP. Go on 03/05/2021.   Specialty: Family Medicine Why: Arrive at [employer] paper work from hospital, ID, proof of income, proof of address, and money for [employer] information: [place] [address] [phone]        Murlean Iba, MD Follow up.   Specialty: Nephrology Why: March 19, 2021 at 11:30am Contact information: [place] [address] [phone]              No Known Allergies  Consultations:  ID  Cardio  Nephro    Procedures/Studies: CT ABDOMEN PELVIS WO CONTRAST  Result Date: 03/06/2021 CLINICAL DATA:  Chronic diarrhea and weight loss. Left-sided abdominal pain for 1 week. Poor kidney function. EXAM: CT ABDOMEN AND PELVIS WITHOUT  CONTRAST TECHNIQUE: Multidetector CT imaging of the abdomen and pelvis was performed following the standard protocol without IV contrast. COMPARISON:  03/02/2021 FINDINGS: Lower chest: The lung bases are clear. The heart size is normal. The intracardiac blood pool is hypodense relative to the adjacent myocardium consistent with anemia. Hepatobiliary: The liver is normal. Normal gallbladder.There is no biliary ductal dilation. Pancreas: Normal contours without ductal dilatation. No peripancreatic fluid collection. Spleen: There is a stable hypodense area in the splenic parenchyma towards the hilum. Adrenals/Urinary Tract: --Adrenal glands: Unremarkable. --Right kidney/ureter: No hydronephrosis or radiopaque kidney stones. --Left kidney/ureter: In the left perinephric space there is a new air and fluid collection abutting the lower pole of the left kidney.  This collection measures approximately 5.9 cm across. This is likely a small perinephric hematoma in the setting of recent renal biopsy. --Urinary bladder: There is diffuse bladder wall thickening. Stomach/Bowel: --Stomach/Duodenum: No hiatal hernia or other gastric abnormality. Normal duodenal course and caliber. --Small bowel: There is mild wall thickening of several loops of small bowel in the mid left abdomen. --Colon: There appears to be mild wall thickening of the descending colon. --Appendix: Normal. Vascular/Lymphatic: Normal course and caliber of the major abdominal vessels. --No retroperitoneal lymphadenopathy. --No mesenteric lymphadenopathy. --No pelvic or inguinal lymphadenopathy. Reproductive: Unremarkable Other: There is a small volume of free fluid in the abdomen and pelvis. There is anasarca. Musculoskeletal. 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 IMPRESSION: 1. New collection about the left kidney favored to represent a small perinephric hematoma in the setting of recent renal biopsy. 2. Anemia. 3.  Anasarca. 4. No specific abnormality identified to explain the patient's symptoms. Electronically Signed   By: Constance Holster M.D.   On: 03/06/2021 19:47   CT Head Wo Contrast  Result Date: 03/02/2021 CLINICAL DATA:  Headache. EXAM: CT HEAD WITHOUT CONTRAST TECHNIQUE: Contiguous axial images were obtained from the base of the skull through the vertex without intravenous contrast. COMPARISON:  12/15/2017. FINDINGS: Brain: No evidence of acute infarction, hemorrhage, hydrocephalus, extra-axial collection or mass lesion/mass effect. 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 Vascular: No hyperdense vessel identified. Skull: No acute fracture. Sinuses/Orbits: Minimal ethmoid air cell mucosal thickening. No air-fluid levels visualized. Unremarkable visualized orbits. Other: No mastoid effusion. IMPRESSION: No evidence of acute intracranial abnormality. Electronically Signed   By: Margaretha Sheffield MD   On: 03/02/2021 16:30   ECHOCARDIOGRAM COMPLETE  Result Date: 03/03/2021    ECHOCARDIOGRAM REPORT   Patient Name:   VERONICA GUERRANT Date of Exam: 03/03/2021 Medical Rec #:  [id number]      Height:       67.0 in Accession #:    [id number]     Weight:       138.0 lb Date of Birth:  03/28/1991      BSA:          1.727 m Patient Age:    30 years       BP:           114/79 mmHg Patient Gender: M              HR:           88 bpm. Exam Location:  [facility] Procedure: 2D Echo, Cardiac Doppler and Color Doppler Indications:     Murmur R01.1  History:         Patient has no prior history of Echocardiogram examinations.  Sonographer:     Alyse Low Roar Referring Phys:  [id number] Clarnce Flock Diagnosing Phys: Ida Rogue MD IMPRESSIONS  1. Left ventricular ejection fraction, by estimation, is 60 to 65%. The left ventricle has normal function. The left ventricle has no regional wall motion abnormalities. Left ventricular diastolic parameters were normal.  2. Right ventricular systolic  function is normal. The right ventricular size is normal. There is mildly elevated pulmonary artery systolic pressure. The estimated right ventricular systolic pressure is 90.3 mmHg.  3. Left atrial size was moderately dilated.  4. The mitral valve is abnormal.Posterior leaflet with prolapse, unable to exclude partially flail leaflet or torn chordea (images 30-32) Moderate eccentric mitral valve regurgitation. No evidence of mitral stenosis.  5. The inferior vena cava is dilated in size with >50% respiratory variability, suggesting right atrial pressure of 8 mmHg. FINDINGS  Left Ventricle: Left ventricular ejection fraction, by estimation, is 60 to 65%. The left ventricle has normal function. The left ventricle has no regional wall motion abnormalities. The left ventricular internal cavity size was normal in size. There is  no left ventricular hypertrophy. Left ventricular diastolic parameters were normal. Right Ventricle: The right ventricular size is normal. No increase in right ventricular wall thickness. Right ventricular systolic function is normal. There is mildly elevated pulmonary artery systolic pressure. The tricuspid regurgitant velocity is 2.66  m/s, and with an assumed right atrial pressure of 10 mmHg, the estimated right ventricular systolic pressure is 16.1 mmHg. Left Atrium: Left atrial size was moderately dilated. Right Atrium: Right atrial size was normal in size. Pericardium: There is no evidence of pericardial effusion. Mitral Valve: The mitral valve is abnormal.Posterior leaflet with prolapse, unable to exclude partially flail leaflet or torn chordea (images 30-32)Moderate mitral valve regurgitation. No evidence of mitral valve stenosis. Tricuspid Valve: The tricuspid valve is normal in structure. Tricuspid valve regurgitation is mild . No evidence of tricuspid stenosis. Aortic Valve: The aortic valve is normal in structure. Aortic valve regurgitation is not visualized. No aortic stenosis is  present. Aortic valve peak gradient measures 7.2 mmHg. Pulmonic Valve: The pulmonic valve was normal in structure. Pulmonic valve regurgitation is not visualized. No evidence of pulmonic stenosis. Aorta: The aortic root is normal in size and structure. Venous: The inferior vena cava is dilated in size with greater than 50% respiratory variability, suggesting right atrial pressure of 8 mmHg. IAS/Shunts: No atrial level shunt detected by color flow Doppler.  LEFT VENTRICLE PLAX 2D LVIDd:         4.86 cm  Diastology LVIDs:         3.55 cm  LV e' medial:    12.40 cm/s LV PW:         1.10 cm  LV E/e' medial:  11.5 LV IVS:        0.95 cm  LV e' lateral:   17.30 cm/s LVOT diam:     2.00 cm  LV E/e' lateral: 8.2 LVOT Area:     3.14 cm  RIGHT VENTRICLE RV Mid diam:    3.43 cm RV S prime:     15.60 cm/s TAPSE (M-mode): 3.4 cm LEFT ATRIUM           Index       RIGHT ATRIUM           Index LA diam:      4.30 cm 2.49 cm/m  RA Area:     17.80 cm LA Vol (A2C): 97.9 ml 56.68 ml/m RA Volume:   50.50 ml  29.24 ml/m LA Vol (A4C): 52.4 ml 30.34 ml/m  AORTIC VALVE                PULMONIC VALVE AV Area (Vmax): 2.95 cm    PV Vmax:        1.18 m/s AV Vmax:        134.00 cm/s PV Peak grad:   5.6 mmHg AV Peak Grad:   7.2 mmHg    RVOT Peak grad: 1 mmHg LVOT Vmax:      126.00 cm/s  AORTA Ao Root diam: 2.70 cm MITRAL VALVE                TRICUSPID VALVE MV Area (PHT): 3.02 cm  TR Peak grad:   28.3 mmHg MV Decel Time: 251 msec     TR Vmax:        266.00 cm/s MV E velocity: 142.00 cm/s MV A velocity: 103.00 cm/s  SHUNTS MV E/A ratio:  1.38         Systemic Diam: 2.00 cm MV A Prime:    10.4 cm/s Ida Rogue MD Electronically signed by Ida Rogue MD Signature Date/Time: 03/03/2021/6:08:05 PM    Final    CT Renal Stone Study  Result Date: 03/02/2021 CLINICAL DATA:  Renal parenchymal disease with hematuria EXAM: CT ABDOMEN AND PELVIS WITHOUT CONTRAST TECHNIQUE: Multidetector CT imaging of the abdomen and pelvis was performed  following the standard protocol without IV contrast. COMPARISON:  07/02/2012 FINDINGS: Lower chest: No acute abnormality. Hepatobiliary: No focal hepatic abnormality. Gallbladder unremarkable. Pancreas: No focal abnormality or ductal dilatation. Spleen: No focal abnormality.  Normal size. Adrenals/Urinary Tract: No adrenal abnormality. No focal renal abnormality. No stones or hydronephrosis. Urinary bladder is unremarkable. Stomach/Bowel: Normal appendix. Stomach, large and small bowel grossly unremarkable. Vascular/Lymphatic: No evidence of aneurysm or adenopathy. Reproductive: No visible focal abnormality. Other: Small amount of free fluid noted adjacent to the liver and in the cul-de-sac. Musculoskeletal: No acute bony abnormality. IMPRESSION: No renal or ureteral stones.  No hydronephrosis. Small amount of free fluid in the abdomen and pelvis of unknown etiology. Electronically Signed   By: Rolm Baptise M.D.   On: 03/02/2021 20:38   ECHO TEE  Result Date: 03/06/2021    TRANSESOPHOGEAL ECHO REPORT   Patient Name:   BETZALEL UMBARGER Date of Exam: 03/06/2021 Medical Rec #:  [id number]      Height:       67.0 in Accession #:    [id number]     Weight:       132.0 lb Date of Birth:  1991-05-02      BSA:          1.695 m Patient Age:    30 years       BP:           121/73 mmHg Patient Gender: M              HR:           68 bpm. Exam Location:  [facility] Procedure: Transesophageal Echo, Color Doppler and Cardiac Doppler Indications:     Not listed on TEE check-in sheet  History:         Patient has prior history of Echocardiogram examinations, most                  recent 03/03/2021. Signs/Symptoms:Murmur.  Sonographer:     Sherrie Sport RDCS (AE) Referring Phys:  [id number] [place] Diagnosing Phys: Kate Sable MD PROCEDURE: The transesophogeal probe was passed without difficulty through the esophogus of the patient. Sedation performed by performing physician. The patient developed no complications during the procedure.  IMPRESSIONS  1. Left ventricular ejection fraction, by estimation, is 60 to 65%. The left ventricle has normal function. The left ventricle has no regional wall motion abnormalities.  2. Right ventricular systolic function is normal. The right ventricular size is normal.  3. No left atrial/left atrial appendage thrombus was detected.  4. 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 The mitral valve is abnormal. Mild to moderate mitral valve regurgitation. 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  5. The aortic valve is tricuspid. Aortic  valve regurgitation is not visualized. 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 There is no clear evidence for endocarditis. likely Etiology for ruptured chordae include mitral valve prolapse or endocarditis (since patient is bacteremic).  Recommend emperic treatment for endocarditis since patient is bacteremic. Surgical consult for mitral valve repair/replacement considerations can be obtained on outpatient basis (patient is hemodynamically stable and not in heart failure). FINDINGS  Left Ventricle: Left ventricular ejection fraction, by estimation, is 60 to 65%. The left ventricle has normal function. The left ventricle has no regional wall motion abnormalities. The left ventricular internal cavity size was normal in size. Right Ventricle: The right ventricular size is normal. No increase in right ventricular wall thickness. Right ventricular systolic function is normal. Left Atrium: Left atrial size was normal in size. No left atrial/left atrial appendage thrombus was detected. Right Atrium: Right atrial size was normal in size. Pericardium: There is no evidence of pericardial effusion. 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 The mitral valve is abnormal. 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  Mild to moderate mitral valve regurgitation. Tricuspid Valve: The tricuspid valve is normal in structure. Tricuspid valve regurgitation is trivial. Aortic Valve: The aortic valve is tricuspid. Aortic valve regurgitation is not visualized. Pulmonic Valve: The pulmonic valve was normal in structure. Pulmonic valve regurgitation is trivial. Aorta: The aortic root is normal in size and structure. Venous: The inferior vena cava was not well visualized. IAS/Shunts: No atrial level shunt detected by color flow Doppler. Kate Sable MD Electronically signed by Kate Sable MD Signature Date/Time: 03/06/2021/5:00:13 PM    Final    US BIOPSY (KIDNEY)  Result Date: 03/04/2021 INDICATION: 30 year old male with a history of acute renal failure and 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 ANESTHESIA/SEDATION: Moderate (conscious) sedation was employed during this procedure. A total of Versed 2.0 mg and Fentanyl 100 mcg was administered intravenously. Moderate Sedation Time: 10 minutes. The patient's level of consciousness and vital signs were monitored continuously by radiology nursing throughout the procedure under my direct supervision. FLUOROSCOPY TIME:  Ultrasound COMPLICATIONS: None PROCEDURE: Informed written consent was obtained from the patient after a thorough discussion of the procedural risks, benefits and alternatives. All questions were addressed. Maximal Sterile Barrier Technique was utilized including caps, mask, sterile gowns, sterile gloves, sterile drape, hand hygiene and skin antiseptic. A timeout was performed prior to the initiation of the procedure Patient was positioned prone position on the gantry table. Images were stored sent to PACs. Once the patient is prepped and draped in the usual sterile fashion, the skin and subcutaneous tissues overlying the left kidney were generously infiltrated 1% lidocaine for local anesthesia. Using ultrasound guidance, a 15 gauge  guide needle was advanced into the lower cortex of the left kidney. Once we confirmed location of the needle tip, 2 separate 16 gauge core biopsy were achieved. Two Gel-Foam pledgets were infused with a small amount of saline. The needle was removed. Final images were stored. The patient tolerated the procedure well and remained hemodynamically stable throughout. No complications were encountered and no significant blood loss encountered. IMPRESSION: Status post ultrasound-guided biopsy of left kidney for medical renal purpose. Signed, Dulcy Fanny. Dellia Nims, RPVI [facility] Electronically Signed   By: Corrie Mckusick D.O.   On: 03/04/2021 12:11   [place] EKG SITE RITE  Result Date: 03/10/2021 If Site Rite image not  attached, placement could not be confirmed due to current cardiac rhythm.     Subjective: Pt c/o fatigue    Discharge Exam: Vitals:   03/11/21 0726 03/11/21 1127  BP: (!) 136/93 129/82  Pulse: 78 76  Resp: 20 18  Temp: 98.1 F (36.7 C) 98.1 F (36.7 C)  SpO2: 100% 100%   Vitals:   03/10/21 2310 03/11/21 0533 03/11/21 0726 03/11/21 1127  BP: 125/79 (!) 131/91 (!) 136/93 129/82  Pulse: 70 76 78 76  Resp: _0 Temp: 97.9 F (36.6 C) 98.3 F (36.8 C) 98.1 F (36.7 C) 98.1 F (36.7 C)  TempSrc: Oral  Oral Oral  SpO2: 100% 100% 100% 100%  Weight:      Height:        General: Pt is alert, awake, not in acute distress Cardiovascular: S1/S2 +, no rubs, no gallops Respiratory: CTA bilaterally, no wheezing, no rhonchi Abdominal: Soft, NT, ND, bowel sounds + Extremities: no edema, no cyanosis    The results of significant diagnostics from this hospitalization (including imaging, microbiology, ancillary and laboratory) are listed below for reference.     Microbiology: Recent Results (from the past 240 hour(s))  Resp Panel by RT-PCR (Flu A&B, Covid) Nasopharyngeal Swab     Status: None   Collection Time:  03/02/21  4:31 PM   Specimen: Nasopharyngeal Swab; Nasopharyngeal(NP) swabs in vial transport medium  Result Value Ref Range Status   SARS Coronavirus 2 by RT PCR NEGATIVE NEGATIVE Final    Comment: (NOTE) SARS-CoV-2 target nucleic acids are NOT DETECTED.  The SARS-CoV-2 RNA is generally detectable in upper respiratory specimens during the acute phase of infection. The lowest concentration of SARS-CoV-2 viral copies this assay can detect is 138 copies/mL. A negative result does not preclude SARS-Cov-2 infection and should not be used as the sole basis for treatment or other patient management decisions. A negative result may occur with  improper specimen collection/handling, submission of specimen other than nasopharyngeal swab, presence of viral mutation(s) within the areas targeted by this assay, and inadequate number of viral copies(<138 copies/mL). A negative result must be combined with clinical observations, patient history, and epidemiological information. The expected result is Negative.  Fact Sheet for Patients:  EntrepreneurPulse.com.au  Fact Sheet for Healthcare Providers:  IncredibleEmployment.be  This test is no t yet approved or cleared by the [place] FDA and  has been authorized for detection and/or diagnosis of SARS-CoV-2 by FDA under an Emergency Use Authorization (EUA). This EUA will remain  in effect (meaning this test can be used) for the duration of the COVID-19 declaration under Section 564(b)(1) of the Act, 21 U.S.C.section 360bbb-3(b)(1), unless the authorization is terminated  or revoked sooner.       Influenza A by PCR NEGATIVE NEGATIVE Final   Influenza B by PCR NEGATIVE NEGATIVE Final    Comment: (NOTE) The Xpert Xpress SARS-CoV-2/FLU/RSV plus assay is intended as an aid in the diagnosis of influenza from Nasopharyngeal swab specimens and should not be used as a sole basis for treatment. Nasal washings  and aspirates are unacceptable for Xpert Xpress SARS-CoV-2/FLU/RSV testing.  Fact Sheet for Patients: EntrepreneurPulse.com.au  Fact Sheet for Healthcare Providers: IncredibleEmployment.be  This test is not yet approved or cleared by the [place] FDA and has been authorized for detection and/or diagnosis of SARS-CoV-2 by FDA under an Emergency Use Authorization (EUA). This EUA will remain in effect (meaning this test can be used) for the duration of the COVID-19 declaration  under Section 564(b)(1) of the Act, 21 U.S.C. section 360bbb-3(b)(1), unless the authorization is terminated or revoked.  Performed at [facility], [address] [place], [place] [address]   Group A Strep by PCR     Status: None   Collection Time: 03/05/21  4:46 PM   Specimen: Throat; Sterile Swab  Result Value Ref Range Status   Group A Strep by PCR NOT DETECTED NOT DETECTED Final    Comment: Performed at [facility], [address] [place], [place] [address]  Culture, blood (Routine X 2) w Reflex to ID Panel     Status: Abnormal   Collection Time: 03/05/21  5:46 PM   Specimen: BLOOD  Result Value Ref Range Status   Specimen Description   Final    BLOOD LEFT ANTECUBITAL Performed at [facility], [address] [place], [place] [address]    Special Requests   Final    BOTTLES DRAWN AEROBIC AND ANAEROBIC Blood Culture adequate volume Performed at [facility], [address] [place], [place] [address]    Culture  Setup Time   Final    GRAM POSITIVE COCCI IN BOTH AEROBIC AND ANAEROBIC BOTTLES CRITICAL VALUE NOTED.  VALUE IS CONSISTENT WITH PREVIOUSLY REPORTED AND CALLED VALUE. Performed at [facility], [address] [place], [place] [address]    Culture (A)  Final    ENTEROCOCCUS FAECALIS SUSCEPTIBILITIES PERFORMED ON PREVIOUS CULTURE WITHIN THE LAST 5 DAYS. Performed at [facility], [address] [address] [place], [place] [address]    Report Status 03/08/2021 FINAL  Final  Culture, blood (Routine X 2) w Reflex to ID Panel     Status: Abnormal   Collection Time: 03/05/21  5:50 PM   Specimen: BLOOD  Result Value Ref Range Status   Specimen Description   Final    BLOOD RIGHT ANTECUBITAL Performed at [facility], [address] [place], [place] [address]    Special Requests   Final    BOTTLES DRAWN AEROBIC AND ANAEROBIC Blood Culture adequate volume Performed at [facility], [address] [place], [place] [address]    Culture  Setup Time   Final    Organism ID to follow GRAM POSITIVE COCCI IN BOTH AEROBIC AND ANAEROBIC BOTTLES CRITICAL RESULT CALLED TO, READ BACK BY AND VERIFIED WITH: SUSAN WATSON AT 1146 03/06/21 [place] Performed at [facility], [address] [place], [place] [address]    Culture ENTEROCOCCUS FAECALIS (A)  Final   Report Status 03/08/2021 FINAL  Final   Organism ID, Bacteria ENTEROCOCCUS FAECALIS  Final      Susceptibility   Enterococcus faecalis - MIC*    AMPICILLIN <=2 SENSITIVE Sensitive     VANCOMYCIN 1 SENSITIVE Sensitive     GENTAMICIN SYNERGY SENSITIVE Sensitive     * ENTEROCOCCUS FAECALIS  Blood Culture ID Panel (Reflexed)     Status: Abnormal   Collection Time: 03/05/21  5:50 PM  Result Value Ref Range Status   Enterococcus faecalis DETECTED (A) NOT DETECTED Final    Comment: CRITICAL RESULT CALLED TO, READ BACK BY AND VERIFIED WITH:  SUSAN WATSON AT 1146 03/06/21 SDR    Enterococcus Faecium NOT DETECTED NOT DETECTED Final   Listeria monocytogenes NOT DETECTED NOT DETECTED Final   Staphylococcus species NOT DETECTED NOT DETECTED Final   Staphylococcus aureus (BCID) NOT DETECTED NOT DETECTED Final   Staphylococcus epidermidis NOT DETECTED NOT DETECTED Final   Staphylococcus lugdunensis NOT DETECTED NOT DETECTED  Final   Streptococcus species NOT DETECTED NOT DETECTED Final   Streptococcus agalactiae NOT DETECTED  NOT DETECTED Final   Streptococcus pneumoniae NOT DETECTED NOT DETECTED Final   Streptococcus pyogenes NOT DETECTED NOT DETECTED Final   A.calcoaceticus-baumannii NOT DETECTED NOT DETECTED Final   Bacteroides fragilis NOT DETECTED NOT DETECTED Final   Enterobacterales NOT DETECTED NOT DETECTED Final   Enterobacter cloacae complex NOT DETECTED NOT DETECTED Final   Escherichia coli NOT DETECTED NOT DETECTED Final   Klebsiella aerogenes NOT DETECTED NOT DETECTED Final   Klebsiella oxytoca NOT DETECTED NOT DETECTED Final   Klebsiella pneumoniae NOT DETECTED NOT DETECTED Final   Proteus species NOT DETECTED NOT DETECTED Final   Salmonella species NOT DETECTED NOT DETECTED Final   Serratia marcescens NOT DETECTED NOT DETECTED Final   Haemophilus influenzae NOT DETECTED NOT DETECTED Final   Neisseria meningitidis NOT DETECTED NOT DETECTED Final   Pseudomonas aeruginosa NOT DETECTED NOT DETECTED Final   Stenotrophomonas maltophilia NOT DETECTED NOT DETECTED Final   Candida albicans NOT DETECTED NOT DETECTED Final   Candida auris NOT DETECTED NOT DETECTED Final   Candida glabrata NOT DETECTED NOT DETECTED Final   Candida krusei NOT DETECTED NOT DETECTED Final   Candida parapsilosis NOT DETECTED NOT DETECTED Final   Candida tropicalis NOT DETECTED NOT DETECTED Final   Cryptococcus neoformans/gattii NOT DETECTED NOT DETECTED Final   Vancomycin resistance NOT DETECTED NOT DETECTED Final    Comment: Performed at [facility], [address] [place], [place] [address]  Culture, blood (single) w Reflex to ID Panel     Status: None (Preliminary result)   Collection Time: 03/07/21  6:31 AM   Specimen: BLOOD  Result Value Ref Range Status   Specimen Description BLOOD  LEFT AC  Final   Special Requests   Final    BOTTLES DRAWN AEROBIC AND ANAEROBIC Blood Culture results may not be optimal due to an excessive volume of blood received in culture bottles   Culture   Final    NO GROWTH 4  DAYS Performed at [facility], [address] [place], [place] [address]    Report Status PENDING  Incomplete  CULTURE, BLOOD (ROUTINE X 2) w Reflex to ID Panel     Status: None (Preliminary result)   Collection Time: 03/09/21  9:44 AM   Specimen: BLOOD  Result Value Ref Range Status   Specimen Description BLOOD LEFT ANTECUBITAL  Final   Special Requests   Final    BOTTLES DRAWN AEROBIC AND ANAEROBIC Blood Culture results may not be optimal due to an excessive volume of blood received in culture bottles   Culture   Final    NO GROWTH 2 DAYS Performed at [facility], [address] [place], [place] [address]    Report Status PENDING  Incomplete  CULTURE, BLOOD (ROUTINE X 2) w Reflex to ID Panel     Status: None (Preliminary result)   Collection Time: 03/09/21  9:46 AM   Specimen: BLOOD  Result Value Ref Range Status   Specimen Description BLOOD RIGHT ARM  Final   Special Requests   Final    BOTTLES DRAWN AEROBIC AND ANAEROBIC Blood Culture adequate volume   Culture   Final    NO GROWTH 2 DAYS Performed at [facility], [address] [place],  [address]    Report Status PENDING  Incomplete     Labs: BNP (last 3 results) No results for input(s): BNP in the last 8760  hours. Basic Metabolic Panel: Recent Labs  Lab 03/07/21 0631 03/08/21 0425 03/09/21 0415 03/10/21 0540 03/11/21 0553  NA 132* 137 138 139 139  K 4.7 4.9 4.5 4.5 4.6  CL 101 105 104 107 106  CO2 _0 GLUCOSE 111* 102* 103* 98 89  BUN 80* 76* 66* 57* 52*  CREATININE 4.18* 3.70* 3.38* 3.13* 2.95*  CALCIUM 7.8* 8.1* 7.9* 8.1* 8.0*  MG 3.1* 2.8*  --   --   --   PHOS 6.4* 6.5* 6.2* 5.8* 5.5*   Liver Function Tests: Recent Labs  Lab 03/07/21 0631 03/08/21 0425 03/09/21 0415 03/10/21 0540 03/11/21 0553  ALBUMIN 2.3* 2.3* 2.3* 2.4* 2.4*   No results for input(s): LIPASE, AMYLASE in the last 168 hours. No results for input(s): AMMONIA in the last 168  hours. CBC: Recent Labs  Lab 03/06/21 0444 03/07/21 0631 03/08/21 0425 03/11/21 0553  WBC 6.3 7.7 7.9 7.4  HGB 9.9* 8.3* 8.4* 8.4*  HCT 31.1* 25.8* 25.2* 26.3*  MCV 85.9 86.3 85.7 87.7  PLT 287 292 309 346   Cardiac Enzymes: No results for input(s): CKTOTAL, CKMB, CKMBINDEX, TROPONINI in the last 168 hours. BNP: Invalid input(s): POCBNP CBG: No results for input(s): GLUCAP in the last 168 hours. D-Dimer No results for input(s): DDIMER in the last 72 hours. Hgb A1c No results for input(s): HGBA1C in the last 72 hours. Lipid Profile No results for input(s): CHOL, HDL, LDLCALC, TRIG, CHOLHDL, LDLDIRECT in the last 72 hours. Thyroid function studies No results for input(s): TSH, T4TOTAL, T3FREE, THYROIDAB in the last 72 hours.  Invalid input(s): FREET3 Anemia work up No results for input(s): VITAMINB12, FOLATE, FERRITIN, TIBC, IRON, RETICCTPCT in the last 72 hours. Urinalysis    Component Value Date/Time   COLORURINE AMBER (A) 03/02/2021 1905   APPEARANCEUR CLOUDY (A) 03/02/2021 1905   LABSPEC 1.012 03/02/2021 1905   PHURINE 5.0 03/02/2021 1905   GLUCOSEU NEGATIVE 03/02/2021 1905   HGBUR LARGE (A) 03/02/2021 1905   BILIRUBINUR NEGATIVE 03/02/2021 1905   KETONESUR NEGATIVE 03/02/2021 1905   PROTEINUR >=300 (A) 03/02/2021 1905   NITRITE NEGATIVE 03/02/2021 1905   LEUKOCYTESUR MODERATE (A) 03/02/2021 1905   Sepsis Labs Invalid input(s): PROCALCITONIN,  WBC,  LACTICIDVEN Microbiology Recent Results (from the past 240 hour(s))  Resp Panel by RT-PCR (Flu A&B, Covid) Nasopharyngeal Swab     Status: None   Collection Time: 03/02/21  4:31 PM   Specimen: Nasopharyngeal Swab; Nasopharyngeal(NP) swabs in vial transport medium  Result Value Ref Range Status   SARS Coronavirus 2 by RT PCR NEGATIVE NEGATIVE Final    Comment: (NOTE) SARS-CoV-2 target nucleic acids are NOT DETECTED.  The SARS-CoV-2 RNA is generally detectable in upper respiratory specimens during the acute  phase of infection. The lowest concentration of SARS-CoV-2 viral copies this assay can detect is 138 copies/mL. A negative result does not preclude SARS-Cov-2 infection and should not be used as the sole basis for treatment or other patient management decisions. A negative result may occur with  improper specimen collection/handling, submission of specimen other than nasopharyngeal swab, presence of viral mutation(s) within the areas targeted by this assay, and inadequate number of viral copies(<138 copies/mL). A negative result must be combined with clinical observations, patient history, and epidemiological information. The expected result is Negative.  Fact Sheet for Patients:  EntrepreneurPulse.com.au  Fact Sheet for Healthcare Providers:  IncredibleEmployment.be  This test is no t yet approved or cleared by the [place] and  has been authorized for detection and/or diagnosis of SARS-CoV-2 by FDA under an Emergency Use Authorization (EUA). This EUA will remain  in effect (meaning this test can be used) for the duration of the COVID-19 declaration under Section 564(b)(1) of the Act, 21 U.S.C.section 360bbb-3(b)(1), unless the authorization is terminated  or revoked sooner.       Influenza A by PCR NEGATIVE NEGATIVE Final   Influenza B by PCR NEGATIVE NEGATIVE Final    Comment: (NOTE) The Xpert Xpress SARS-CoV-2/FLU/RSV plus assay is intended as an aid in the diagnosis of influenza from Nasopharyngeal swab specimens and should not be used as a sole basis for treatment. Nasal washings and aspirates are unacceptable for Xpert Xpress SARS-CoV-2/FLU/RSV testing.  Fact Sheet for Patients: EntrepreneurPulse.com.au  Fact Sheet for Healthcare Providers: IncredibleEmployment.be  This test is not yet approved or cleared by the [place] FDA and has been authorized for detection and/or diagnosis of  SARS-CoV-2 by FDA under an Emergency Use Authorization (EUA). This EUA will remain in effect (meaning this test can be used) for the duration of the COVID-19 declaration under Section 564(b)(1) of the Act, 21 U.S.C. section 360bbb-3(b)(1), unless the authorization is terminated or revoked.  Performed at [facility], [address] [place], [place] [address]   Group A Strep by PCR     Status: None   Collection Time: 03/05/21  4:46 PM   Specimen: Throat; Sterile Swab  Result Value Ref Range Status   Group A Strep by PCR NOT DETECTED NOT DETECTED Final    Comment: Performed at [facility], [address] [place], [place] [address]  Culture, blood (Routine X 2) w Reflex to ID Panel     Status: Abnormal   Collection Time: 03/05/21  5:46 PM   Specimen: BLOOD  Result Value Ref Range Status   Specimen Description   Final    BLOOD LEFT ANTECUBITAL Performed at [facility] [address], [place], [place] [address]    Special Requests   Final    BOTTLES DRAWN AEROBIC AND ANAEROBIC Blood Culture adequate volume Performed at [facility], [address] [place], [place] [address]    Culture  Setup Time   Final    GRAM POSITIVE COCCI IN BOTH AEROBIC AND ANAEROBIC BOTTLES CRITICAL VALUE NOTED.  VALUE IS CONSISTENT WITH PREVIOUSLY REPORTED AND CALLED VALUE. Performed at [facility], [address] [place], [place] [address]    Culture (A)  Final    ENTEROCOCCUS FAECALIS SUSCEPTIBILITIES PERFORMED ON PREVIOUS CULTURE WITHIN THE LAST 5 DAYS. Performed at [facility], [place] [address], [place], [place] [address]    Report Status 03/08/2021 FINAL  Final  Culture, blood (Routine X 2) w Reflex to ID Panel     Status: Abnormal   Collection Time: 03/05/21  5:50 PM   Specimen: BLOOD  Result Value Ref Range Status   Specimen Description   Final    BLOOD RIGHT ANTECUBITAL Performed at [facility], [address]  [place], [place] [address]    Special Requests   Final    BOTTLES DRAWN AEROBIC AND ANAEROBIC Blood Culture adequate volume Performed at [facility], [address] [place],  [address]    Culture  Setup Time   Final    Organism ID to follow GRAM POSITIVE COCCI IN BOTH AEROBIC AND ANAEROBIC BOTTLES CRITICAL RESULT CALLED TO, READ BACK BY AND VERIFIED WITH: SUSAN WATSON AT 1146 03/06/21 SDR Performed at  [facility], [address] [place], [place] [address]    Culture ENTEROCOCCUS FAECALIS (A)  Final   Report Status 03/08/2021 FINAL  Final   Organism ID, Bacteria ENTEROCOCCUS FAECALIS  Final      Susceptibility   Enterococcus faecalis - MIC*    AMPICILLIN <=2 SENSITIVE Sensitive     VANCOMYCIN 1 SENSITIVE Sensitive     GENTAMICIN SYNERGY SENSITIVE Sensitive     * ENTEROCOCCUS FAECALIS  Blood Culture ID Panel (Reflexed)     Status: Abnormal   Collection Time: 03/05/21  5:50 PM  Result Value Ref Range Status   Enterococcus faecalis DETECTED (A) NOT DETECTED Final    Comment: CRITICAL RESULT CALLED TO, READ BACK BY AND VERIFIED WITH:  SUSAN WATSON AT 1146 03/06/21 SDR    Enterococcus Faecium NOT DETECTED NOT DETECTED Final   Listeria monocytogenes NOT DETECTED NOT DETECTED Final   Staphylococcus species NOT DETECTED NOT DETECTED Final   Staphylococcus aureus (BCID) NOT DETECTED NOT DETECTED Final   Staphylococcus epidermidis NOT DETECTED NOT DETECTED Final   Staphylococcus lugdunensis NOT DETECTED NOT DETECTED Final   Streptococcus species NOT DETECTED NOT DETECTED Final   Streptococcus agalactiae NOT DETECTED NOT DETECTED Final   Streptococcus pneumoniae NOT DETECTED NOT DETECTED Final   Streptococcus pyogenes NOT DETECTED NOT DETECTED Final   A.calcoaceticus-baumannii NOT DETECTED NOT DETECTED Final   Bacteroides fragilis NOT DETECTED NOT DETECTED Final   Enterobacterales NOT DETECTED NOT DETECTED Final   Enterobacter cloacae complex NOT DETECTED NOT  DETECTED Final   Escherichia coli NOT DETECTED NOT DETECTED Final   Klebsiella aerogenes NOT DETECTED NOT DETECTED Final   Klebsiella oxytoca NOT DETECTED NOT DETECTED Final   Klebsiella pneumoniae NOT DETECTED NOT DETECTED Final   Proteus species NOT DETECTED NOT DETECTED Final   Salmonella species NOT DETECTED NOT DETECTED Final   Serratia marcescens NOT DETECTED NOT DETECTED Final   Haemophilus influenzae NOT DETECTED NOT DETECTED Final   Neisseria meningitidis NOT DETECTED NOT DETECTED Final   Pseudomonas aeruginosa NOT DETECTED NOT DETECTED Final   Stenotrophomonas maltophilia NOT DETECTED NOT DETECTED Final   Candida albicans NOT DETECTED NOT DETECTED Final   Candida auris NOT DETECTED NOT DETECTED Final   Candida glabrata NOT DETECTED NOT DETECTED Final   Candida krusei NOT DETECTED NOT DETECTED Final   Candida parapsilosis NOT DETECTED NOT DETECTED Final   Candida tropicalis NOT DETECTED NOT DETECTED Final   Cryptococcus neoformans/gattii NOT DETECTED NOT DETECTED Final   Vancomycin resistance NOT DETECTED NOT DETECTED Final    Comment: Performed at [facility], [address] [place], [place] [address]  Culture, blood (single) w Reflex to ID Panel     Status: None (Preliminary result)   Collection Time: 03/07/21  6:31 AM   Specimen: BLOOD  Result Value Ref Range Status   Specimen Description BLOOD  LEFT AC  Final   Special Requests   Final    BOTTLES DRAWN AEROBIC AND ANAEROBIC Blood Culture results may not be optimal due to an excessive volume of blood received in culture bottles   Culture   Final    NO GROWTH 4 DAYS Performed at [facility], [address] [place], [place] [address]    Report Status PENDING  Incomplete  CULTURE, BLOOD (ROUTINE X 2) w Reflex to ID Panel     Status: None (Preliminary result)   Collection Time: 03/09/21  9:44 AM   Specimen: BLOOD  Result Value Ref Range Status   Specimen  Description BLOOD LEFT ANTECUBITAL  Final    Special Requests   Final    BOTTLES DRAWN AEROBIC AND ANAEROBIC Blood Culture results may not be optimal due to an excessive volume of blood received in culture bottles   Culture   Final    NO GROWTH 2 DAYS Performed at [facility], [address], [place], [place] [address]    Report Status PENDING  Incomplete  CULTURE, BLOOD (ROUTINE X 2) w Reflex to ID Panel     Status: None (Preliminary result)   Collection Time: 03/09/21  9:46 AM   Specimen: BLOOD  Result Value Ref Range Status   Specimen Description BLOOD RIGHT ARM  Final   Special Requests   Final    BOTTLES DRAWN AEROBIC AND ANAEROBIC Blood Culture adequate volume   Culture   Final    NO GROWTH 2 DAYS Performed at [facility], [address], [place], [place] [address]    Report Status PENDING  Incomplete     Time coordinating discharge: Over 30 minutes  SIGNED:   Wyvonnia Dusky, MD  [facility] 03/11/2021, 12:46 PM Pager   If 7PM-7AM, please contact night-coverage

## 2021-03-11 NOTE — Progress Notes (Signed)
PHARMACY CONSULT NOTE FOR:  OUTPATIENT  PARENTERAL ANTIBIOTIC THERAPY (OPAT)  Indication: enterococcal endocarditis Regimen: ceftriaxone 2gm IV q12h AND ampicillin 8gm daily over 24h as continuous infusion End date: 04/16/2021  IV antibiotic discharge orders are pended. To discharging provider:  please sign these orders via discharge navigator,  Select New Orders & click on the button choice - Manage This Unsigned Work.     Thank you for allowing pharmacy to be a part of this patient's care.  Juliette Alcide, PharmD, BCPS.   Work Cell: [phone] 03/11/2021 10:18 AM

## 2021-03-11 NOTE — TOC Transition Note (Signed)
Transition of Care [facility]) - CM/SW Discharge Note   Patient Details  Name: Jerome Mccormick MRN: [id number] Date of Birth: November 23, 1991  Transition of [facility]) CM/SW Contact:  Maree Krabbe, LCSW Phone Number: 03/11/2021, 1:20 PM   Clinical Narrative:  HH arragned through [facility] IV antibiotics arranged through [facility]. Pam with [facility] will contact pt's mom. No additional needs. Pt will dc after antibiotics given today.     Final next level of care: Home w Home Health Services Barriers to Discharge: No Barriers Identified   Patient Goals and CMS Choice Patient states their goals for this hospitalization and ongoing recovery are:: to return home CMS Medicare.gov Compare Post Acute Care list provided to:: Patient Choice offered to / list presented to : Patient  Discharge Placement                    Patient and family notified of of transfer: 03/11/21  Discharge Plan and Services                          HH Arranged: RN,Social Work [facility] Agency: [facility] Date HH Agency Contacted: 03/11/21 Time HH Agency Contacted: 1320 Representative spoke with at [facility] Agency: Barbara Cower  Social Determinants of Health (SDOH) Interventions     Readmission Risk Interventions No flowsheet data found.

## 2021-03-11 NOTE — Progress Notes (Signed)
[place], [place] 03/11/21  Subjective:   LOS: 9  Jerome Mccormick is a 30 y.o. male with an extensive history of NSAID use and alcohol use. He presents to ED with 7 days of headaches and congestion. He is found to increased Creatinine 4.31.  Patient seen laying in bed Alert and oriented Mother at bedside States his appetite has improved Denies pain and discomfort   Objective:  Vital signs in last 24 hours:  Temp:  [97.9 F (36.6 C)-98.3 F (36.8 C)] 98.1 F (36.7 C) (04/11 1127) Pulse Rate:  [70-78] 76 (04/11 1127) Resp:  [16-20] 18 (04/11 1127) BP: (125-136)/(79-93) 129/82 (04/11 1127) SpO2:  [100 %] 100 % (04/11 1127)  Weight change:  Filed Weights   03/02/21 1533 03/06/21 0859  Weight: 62.6 kg 59.9 kg    Intake/Output:    Intake/Output Summary (Last 24 hours) at 03/11/2021 1550 Last data filed at 03/11/2021 0959 Gross per 24 hour  Intake 300 ml  Output --  Net 300 ml     Physical Exam: General: NAD, resting in bed, anxious  HEENT Anicteric, moist oral mucosa  Pulm/lungs Clear bilaterally, normal breathing pattern  CVS/Heart S1S2 present, regular rate  Abdomen:  Soft, non tender  Extremities: No peripheral edema  Neurologic: Alert, oriented, able to answer questions  Skin: No rashes or masses          Basic Metabolic Panel:  Recent Labs  Lab 03/07/21 0631 03/08/21 0425 03/09/21 0415 03/10/21 0540 03/11/21 0553  NA 132* 137 138 139 139  K 4.7 4.9 4.5 4.5 4.6  CL 101 105 104 107 106  CO2 _0 GLUCOSE 111* 102* 103* 98 89  BUN 80* 76* 66* 57* 52*  CREATININE 4.18* 3.70* 3.38* 3.13* 2.95*  CALCIUM 7.8* 8.1* 7.9* 8.1* 8.0*  MG 3.1* 2.8*  --   --   --   PHOS 6.4* 6.5* 6.2* 5.8* 5.5*     CBC: Recent Labs  Lab 03/06/21 0444 03/07/21 0631 03/08/21 0425 03/11/21 0553  WBC 6.3 7.7 7.9 7.4  HGB 9.9* 8.3* 8.4* 8.4*  HCT 31.1* 25.8* 25.2* 26.3*  MCV 85.9 86.3 85.7 87.7  PLT 287 292 309 346      Lab  Results  Component Value Date   HEPBSAG NON REACTIVE 03/02/2021   HEPBSAB NON REACTIVE 03/02/2021      Microbiology:  Recent Results (from the past 240 hour(s))  Resp Panel by RT-PCR (Flu A&B, Covid) Nasopharyngeal Swab     Status: None   Collection Time: 03/02/21  4:31 PM   Specimen: Nasopharyngeal Swab; Nasopharyngeal(NP) swabs in vial transport medium  Result Value Ref Range Status   SARS Coronavirus 2 by RT PCR NEGATIVE NEGATIVE Final    Comment: (NOTE) SARS-CoV-2 target nucleic acids are NOT DETECTED.  The SARS-CoV-2 RNA is generally detectable in upper respiratory specimens during the acute phase of infection. The lowest concentration of SARS-CoV-2 viral copies this assay can detect is 138 copies/mL. A negative result does not preclude SARS-Cov-2 infection and should not be used as the sole basis for treatment or other patient management decisions. A negative result may occur with  improper specimen collection/handling, submission of specimen other than nasopharyngeal swab, presence of viral mutation(s) within the areas targeted by this assay, and inadequate number of viral copies(<138 copies/mL). A negative result must be combined with clinical observations, patient history, and epidemiological information. The expected result is Negative.  Fact Sheet for Patients:  EntrepreneurPulse.com.au  Fact Sheet for Healthcare Providers:  IncredibleEmployment.be  This test is no t yet approved or cleared by the [place] FDA and  has been authorized for detection and/or diagnosis of SARS-CoV-2 by FDA under an Emergency Use Authorization (EUA). This EUA will remain  in effect (meaning this test can be used) for the duration of the COVID-19 declaration under Section 564(b)(1) of the Act, 21 U.S.C.section 360bbb-3(b)(1), unless the authorization is terminated  or revoked sooner.       Influenza A by PCR NEGATIVE NEGATIVE Final    Influenza B by PCR NEGATIVE NEGATIVE Final    Comment: (NOTE) The Xpert Xpress SARS-CoV-2/FLU/RSV plus assay is intended as an aid in the diagnosis of influenza from Nasopharyngeal swab specimens and should not be used as a sole basis for treatment. Nasal washings and aspirates are unacceptable for Xpert Xpress SARS-CoV-2/FLU/RSV testing.  Fact Sheet for Patients: EntrepreneurPulse.com.au  Fact Sheet for Healthcare Providers: IncredibleEmployment.be  This test is not yet approved or cleared by the [place] FDA and has been authorized for detection and/or diagnosis of SARS-CoV-2 by FDA under an Emergency Use Authorization (EUA). This EUA will remain in effect (meaning this test can be used) for the duration of the COVID-19 declaration under Section 564(b)(1) of the Act, 21 U.S.C. section 360bbb-3(b)(1), unless the authorization is terminated or revoked.  Performed at [facility], [address] [place], [place] [address]   Group A Strep by PCR     Status: None   Collection Time: 03/05/21  4:46 PM   Specimen: Throat; Sterile Swab  Result Value Ref Range Status   Group A Strep by PCR NOT DETECTED NOT DETECTED Final    Comment: Performed at [facility], [address] [place], [place] [address]  Culture, blood (Routine X 2) w Reflex to ID Panel     Status: Abnormal   Collection Time: 03/05/21  5:46 PM   Specimen: BLOOD  Result Value Ref Range Status   Specimen Description   Final    BLOOD LEFT ANTECUBITAL Performed at [facility], [address] [place], [place] [address]    Special Requests   Final    BOTTLES DRAWN AEROBIC AND ANAEROBIC Blood Culture adequate volume Performed at [facility], [address] [place], [place] [address]    Culture  Setup Time   Final    GRAM POSITIVE COCCI IN BOTH AEROBIC AND ANAEROBIC BOTTLES CRITICAL VALUE NOTED.  VALUE IS CONSISTENT WITH PREVIOUSLY  REPORTED AND CALLED VALUE. Performed at [facility], [address], [place], [place] [address]    Culture (A)  Final    ENTEROCOCCUS FAECALIS SUSCEPTIBILITIES PERFORMED ON PREVIOUS CULTURE WITHIN THE LAST 5 DAYS. Performed at [facility], [place] [address] [place], [place] [address]    Report Status 03/08/2021 FINAL  Final  Culture, blood (Routine X 2) w Reflex to ID Panel     Status: Abnormal   Collection Time: 03/05/21  5:50 PM   Specimen: BLOOD  Result Value Ref Range Status   Specimen Description   Final    BLOOD RIGHT ANTECUBITAL Performed at [facility], [address], [place], [place] [address]    Special Requests   Final    BOTTLES DRAWN AEROBIC AND ANAEROBIC Blood Culture adequate volume Performed at [facility], [address] [place], [place] [address]    Culture  Setup Time   Final    Organism ID to follow [place]  IN BOTH AEROBIC AND ANAEROBIC BOTTLES CRITICAL RESULT CALLED TO, READ BACK BY AND VERIFIED WITH: SUSAN WATSON AT 2620 03/06/21 SDR Performed at [facility], [address] [place], [place] [address]    Culture ENTEROCOCCUS FAECALIS (A)  Final   Report Status 03/08/2021 FINAL  Final   Organism ID, Bacteria ENTEROCOCCUS FAECALIS  Final      Susceptibility   Enterococcus faecalis - MIC*    AMPICILLIN <=2 SENSITIVE Sensitive     VANCOMYCIN 1 SENSITIVE Sensitive     GENTAMICIN SYNERGY SENSITIVE Sensitive     * ENTEROCOCCUS FAECALIS  Blood Culture ID Panel (Reflexed)     Status: Abnormal   Collection Time: 03/05/21  5:50 PM  Result Value Ref Range Status   Enterococcus faecalis DETECTED (A) NOT DETECTED Final    Comment: CRITICAL RESULT CALLED TO, READ BACK BY AND VERIFIED WITH:  SUSAN WATSON AT 4163 03/06/21 SDR    Enterococcus Faecium NOT DETECTED NOT DETECTED Final   Listeria monocytogenes NOT DETECTED NOT DETECTED Final   Staphylococcus species NOT DETECTED NOT DETECTED Final    Staphylococcus aureus (BCID) NOT DETECTED NOT DETECTED Final   Staphylococcus epidermidis NOT DETECTED NOT DETECTED Final   Staphylococcus lugdunensis NOT DETECTED NOT DETECTED Final   Streptococcus species NOT DETECTED NOT DETECTED Final   Streptococcus agalactiae NOT DETECTED NOT DETECTED Final   Streptococcus pneumoniae NOT DETECTED NOT DETECTED Final   Streptococcus pyogenes NOT DETECTED NOT DETECTED Final   A.calcoaceticus-baumannii NOT DETECTED NOT DETECTED Final   Bacteroides fragilis NOT DETECTED NOT DETECTED Final   Enterobacterales NOT DETECTED NOT DETECTED Final   Enterobacter cloacae complex NOT DETECTED NOT DETECTED Final   Escherichia coli NOT DETECTED NOT DETECTED Final   Klebsiella aerogenes NOT DETECTED NOT DETECTED Final   Klebsiella oxytoca NOT DETECTED NOT DETECTED Final   Klebsiella pneumoniae NOT DETECTED NOT DETECTED Final   Proteus species NOT DETECTED NOT DETECTED Final   Salmonella species NOT DETECTED NOT DETECTED Final   Serratia marcescens NOT DETECTED NOT DETECTED Final   Haemophilus influenzae NOT DETECTED NOT DETECTED Final   Neisseria meningitidis NOT DETECTED NOT DETECTED Final   Pseudomonas aeruginosa NOT DETECTED NOT DETECTED Final   Stenotrophomonas maltophilia NOT DETECTED NOT DETECTED Final   Candida albicans NOT DETECTED NOT DETECTED Final   Candida auris NOT DETECTED NOT DETECTED Final   Candida glabrata NOT DETECTED NOT DETECTED Final   Candida krusei NOT DETECTED NOT DETECTED Final   Candida parapsilosis NOT DETECTED NOT DETECTED Final   Candida tropicalis NOT DETECTED NOT DETECTED Final   Cryptococcus neoformans/gattii NOT DETECTED NOT DETECTED Final   Vancomycin resistance NOT DETECTED NOT DETECTED Final    Comment: Performed at [facility], [address] [place], [place] [address]  Culture, blood (single) w Reflex to ID Panel     Status: None (Preliminary result)   Collection Time: 03/07/21  6:31 AM   Specimen: BLOOD  Result  Value Ref Range Status   Specimen Description BLOOD  LEFT AC  Final   Special Requests   Final    BOTTLES DRAWN AEROBIC AND ANAEROBIC Blood Culture results may not be optimal due to an excessive volume of blood received in culture bottles   Culture   Final    NO GROWTH 4 DAYS Performed at [facility], [address] [place], [place] [address]    Report Status PENDING  Incomplete  CULTURE, BLOOD (ROUTINE X 2) w Reflex to ID Panel  Status: None (Preliminary result)   Collection Time: 03/09/21  9:44 AM   Specimen: BLOOD  Result Value Ref Range Status   Specimen Description BLOOD LEFT ANTECUBITAL  Final   Special Requests   Final    BOTTLES DRAWN AEROBIC AND ANAEROBIC Blood Culture results may not be optimal due to an excessive volume of blood received in culture bottles   Culture   Final    NO GROWTH 2 DAYS Performed at [facility], [address], [place], [place] [address]    Report Status PENDING  Incomplete  CULTURE, BLOOD (ROUTINE X 2) w Reflex to ID Panel     Status: None (Preliminary result)   Collection Time: 03/09/21  9:46 AM   Specimen: BLOOD  Result Value Ref Range Status   Specimen Description BLOOD RIGHT ARM  Final   Special Requests   Final    BOTTLES DRAWN AEROBIC AND ANAEROBIC Blood Culture adequate volume   Culture   Final    NO GROWTH 2 DAYS Performed at [facility], [address], [place], [place] [address]    Report Status PENDING  Incomplete    Coagulation Studies: Recent Labs    03/11/21 0553  LABPROT 15.2  INR 1.3*    Urinalysis: No results for input(s): COLORURINE, LABSPEC, PHURINE, GLUCOSEU, HGBUR, BILIRUBINUR, KETONESUR, PROTEINUR, UROBILINOGEN, NITRITE, LEUKOCYTESUR in the last 72 hours.  Invalid input(s): APPERANCEUR    Imaging: [place] EKG SITE RITE  Result Date: 03/10/2021 If Site Rite image not attached, placement could not be confirmed due to current cardiac rhythm.    Medications:   . sodium  chloride Stopped (03/09/21 2259)  . ampicillin (OMNIPEN) IV 2 g (03/11/21 1255)  . cefTRIAXone (ROCEPHIN)  IV 2 g (03/11/21 0943)  . methocarbamol (ROBAXIN) IV     . Chlorhexidine Gluconate Cloth  6 each Topical Daily  . folic acid  1 mg Oral Daily  . multivitamin with minerals  1 tablet Oral Daily  . nicotine  14 mg Transdermal Daily  . sodium chloride flush  10-40 mL Intracatheter Q12H  . thiamine  100 mg Oral Daily   Or  . thiamine  100 mg Intravenous Daily   sodium chloride, acetaminophen **OR** acetaminophen, melatonin, methocarbamol (ROBAXIN) IV, ondansetron **OR** ondansetron (ZOFRAN) IV, polyethylene glycol, sodium chloride flush, traZODone  Assessment/ Plan:  30 y.o. male with  was admitted on 03/02/2021 for  Principal Problem:   AKI (acute kidney injury) ([place]) Active Problems:   Anemia   Cardiac murmur   Nonrheumatic mitral valve regurgitation   Mitral valve disease  AKI (acute kidney injury) ([place]) [N17.9] Acute kidney injury ([place]) [N17.9]  #. Acute kidney injury. 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  Mostly neutrophilic infiltrate.  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 ASO titer is normal.  Influenza, SARS coronavirus, hepatitis B studies, hepatitis C studies, HIV are negative.  Blood culture Positive for enterococcus faecalis  - received PICC for home antibiotic use - will follow up in our office to monitor renal recovery  #. Anemia, iron defiency   Lab Results  Component Value Date  HGB 8.4 (L) 03/11/2021   Will  continue to monitor  #. Hematuria - resolved   # Hyperkalemia Lab Results  Component Value Date   K 4.6 03/11/2021    - corrected   LOS: [address] 4/11/20223:50 PM  [place], [place]  Patient was seen and evaluated with Colon Flattery, NP.  Plan of care was discussed with patient as well as NP.  I agree with the note as documented with edits.

## 2021-03-11 NOTE — Progress Notes (Signed)
Progress Note  Patient Name: Jerome Mccormick Date of Encounter: 03/11/2021  Primary Cardiologist: Debbe Odea, MD  Subjective   No chest pain or dyspnea.  Renal function improving.  Hgb low though stable.  Afebrile.  Vital signs stable.  Inpatient Medications    Scheduled Meds: . Chlorhexidine Gluconate Cloth  6 each Topical Daily  . folic acid  1 mg Oral Daily  . multivitamin with minerals  1 tablet Oral Daily  . nicotine  14 mg Transdermal Daily  . sodium chloride flush  10-40 mL Intracatheter Q12H  . thiamine  100 mg Oral Daily   Or  . thiamine  100 mg Intravenous Daily   Continuous Infusions: . sodium chloride Stopped (03/09/21 2259)  . ampicillin (OMNIPEN) IV    . cefTRIAXone (ROCEPHIN)  IV 2 g (03/11/21 0943)  . methocarbamol (ROBAXIN) IV     PRN Meds: sodium chloride, acetaminophen **OR** acetaminophen, melatonin, methocarbamol (ROBAXIN) IV, ondansetron **OR** ondansetron (ZOFRAN) IV, polyethylene glycol, sodium chloride flush, traZODone   Vital Signs    Vitals:   03/10/21 2310 03/11/21 0533 03/11/21 0726 03/11/21 1127  BP: 125/79 (!) 131/91 (!) 136/93 129/82  Pulse: 70 76 78 76  Resp: 16 18 20 18   Temp: 97.9 F (36.6 C) 98.3 F (36.8 C) 98.1 F (36.7 C) 98.1 F (36.7 C)  TempSrc: Oral  Oral Oral  SpO2: 100% 100% 100% 100%  Weight:      Height:        Intake/Output Summary (Last 24 hours) at 03/11/2021 1129 Last data filed at 03/11/2021 0959 Gross per 24 hour  Intake 300 ml  Output --  Net 300 ml   Filed Weights   03/02/21 1533 03/06/21 0859  Weight: 62.6 kg 59.9 kg    Physical Exam   GEN: Well nourished, well developed, in no acute distress.  HEENT: Grossly normal.  Neck: Supple, no JVD, carotid bruits, or masses. Cardiac: RRR, 3/6 high pitched systolic murmur throughout, loudest @ the apex  axilla.  No rubs, or gallops. No clubbing, cyanosis, edema.  Radials 2+, DP/PT 2+ and equal bilaterally.  Respiratory:  Respirations regular and  unlabored, clear to auscultation bilaterally. GI: Soft, nontender, nondistended, BS + x 4. MS: no deformity or atrophy. Skin: warm and dry, no rash. Neuro:  Strength and sensation are intact. Psych: AAOx3.  Normal affect.  Labs    Chemistry Recent Labs  Lab 03/09/21 0415 03/10/21 0540 03/11/21 0553  NA 138 139 139  K 4.5 4.5 4.6  CL 104 107 106  CO2 24 26 25   GLUCOSE 103* 98 89  BUN 66* 57* 52*  CREATININE 3.38* 3.13* 2.95*  CALCIUM 7.9* 8.1* 8.0*  ALBUMIN 2.3* 2.4* 2.4*  GFRNONAA 24* 26* 28*  ANIONGAP 10 6 8      Hematology Recent Labs  Lab 03/07/21 0631 03/08/21 0425 03/11/21 0553  WBC 7.7 7.9 7.4  RBC 2.99* 2.94* 3.00*  HGB 8.3* 8.4* 8.4*  HCT 25.8* 25.2* 26.3*  MCV 86.3 85.7 87.7  MCH 27.8 28.6 28.0  MCHC 32.2 33.3 31.9  RDW 13.4 13.5 13.6  PLT 292 309 346    Lipids  Lab Results  Component Value Date   CHOL 200 02/23/2014   HDL 71.80 02/23/2014   LDLCALC 107 (H) 02/23/2014   TRIG 104.0 02/23/2014   CHOLHDL 3 02/23/2014    Radiology    CT ABDOMEN PELVIS WO CONTRAST  Result Date: 03/06/2021 IMPRESSION: 1. New collection about the left kidney favored to represent  a small perinephric hematoma in the setting of recent renal biopsy. 2. Anemia. 3. Anasarca. 4. No specific abnormality identified to explain the patient's symptoms. Electronically Signed   By: Katherine Mantle M.D.   On: 03/06/2021 19:47   Telemetry    Not on tele  Cardiac Studies   Transesophageal echo 4.6.2022  1. Left ventricular ejection fraction, by estimation, is 60 to 65%. The  left ventricle has normal function. The left ventricle has no regional  wall motion abnormalities.  2. Right ventricular systolic function is normal. The right ventricular  size is normal.  3. No left atrial/left atrial appendage thrombus was detected.  4. 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  The mitral valve is abnormal. Mild to moderate mitral valve  regurgitation.  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  5. The aortic valve is tricuspid. Aortic valve regurgitation is not  visualized.  __________  2D echo 4.3.2022 1. Left ventricular ejection fraction, by estimation, is 60 to 65%. The  left ventricle has normal function. The left ventricle has no regional  wall motion abnormalities. Left ventricular diastolic parameters were  normal.  2. Right ventricular systolic function is normal. The right ventricular  size is normal. There is mildly elevated pulmonary artery systolic  pressure. The estimated right ventricular systolic pressure is 38.3 mmHg.  3. Left atrial size was moderately dilated.  4. The mitral valve is abnormal.Posterior leaflet with prolapse, unable  to exclude partially flail leaflet or torn chordea (images 30-32) Moderate  eccentric mitral valve regurgitation. No evidence of mitral stenosis.  5. The inferior vena cava is dilated in size with >50% respiratory  variability, suggesting right atrial pressure of 8 mmHg.   Patient Profile     30 y.o.malewith hx of etoh use, tobacco use presenting with sob, edema and aki. Found to have moderate mitral regurgitation and possible flail posterior leaflet, transesophageal echo confirming mitral valve pathology, unable to exclude endocarditis.  Assessment & Plan    1.  Moderate MR/Ruptured chordae: Noted on TEE 4/6.  Unable to exclude component of SBE.  Hemodynamically stable.  Euvolemic without dyspnea.  Continue full course of abx per ID.  Clinically stable without evidence of decompensation or volume overload.  He has an appointment scheduled with Dr. Cornelius Moras on 03/20/2021 at 4 PM and will keep this.  Okay to discharge from our perspective.  We will arrange follow-up through our office.  2.  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 BUN/Creat improving.  Nephrology following.  3.  Enterococcus  bacteremia:  ID following.  Broad spectrum abx.  4.  Normocytic anemia:  Stable.   Signed, Eula Listen, PA-C  03/11/2021, 11:29 AM    For questions or updates, please contact   Please consult [email or web address] for contact info under Cardiology/STEMI.

## 2021-03-11 NOTE — Progress Notes (Signed)
Infectious Disease Long Term IV Antibiotic Orders Jerome Mccormick 1991/04/11  Diagnosis: Enterococcal endocarditis  Culture results Endocarditis   LABS Lab Results  Component Value Date   CREATININE 2.95 (H) 03/11/2021   Lab Results  Component Value Date   WBC 7.4 03/11/2021   HGB 8.4 (L) 03/11/2021   HCT 26.3 (L) 03/11/2021   MCV 87.7 03/11/2021   PLT 346 03/11/2021   Lab Results  Component Value Date   ESRSEDRATE 68 (H) 03/05/2021   Lab Results  Component Value Date   CRP 2.9 (H) 03/05/2021    Allergies: No Known Allergies  Discharge antibiotics Ceftriaxone 2 grams every    12     hours Ampicillin  8 grams every 24 hours continuous infusion   PICC Care per protocol Labs weekly while on IV antibiotics -FAX weekly labs to [phone] CBC w diff   Comprehensive met panel CRP   Planned duration of antibiotics 6 weeks  Stop date May 17th 2022  Follow up clinic date 2 weeks  Leonel Ramsay, MD

## 2021-03-11 NOTE — Plan of Care (Addendum)
Discharge order received. Patient mental status is at baseline. Vital signs stable . No signs of acute distress. PICC line remains in placed upon discharge. Discharge instructions given. Patient verbalized understanding. No other issues noted at this time.

## 2021-03-11 NOTE — Progress Notes (Signed)
Peripherally Inserted Central Catheter Placement  The IV Nurse has discussed with the patient and/or persons authorized to consent for the patient, the purpose of this procedure and the potential benefits and risks involved with this procedure.  The benefits include less needle sticks, lab draws from the catheter, and the patient may be discharged home with the catheter. Risks include, but not limited to, infection, bleeding, blood clot (thrombus formation), and puncture of an artery; nerve damage and irregular heartbeat and possibility to perform a PICC exchange if needed/ordered by physician.  Alternatives to this procedure were also discussed.  Bard Power PICC patient education guide, fact sheet on infection prevention and patient information card has been provided to patient /or left at bedside.    PICC Placement Documentation  PICC Single Lumen 03/11/21 PICC Right Brachial 38 cm 0 cm (Active)  Indication for Insertion or Continuance of Line Prolonged intravenous therapies;Home intravenous therapies (PICC only) 03/11/21 0916  Exposed Catheter (cm) 0 cm 03/11/21 0916  Site Assessment Clean;Dry;Intact 03/11/21 0916  Line Status Flushed;Saline locked;Blood return noted 03/11/21 0916  Dressing Type Transparent;Securing device 03/11/21 0916  Dressing Status Clean;Dry;Intact 03/11/21 0916  Antimicrobial disc in place? Yes 03/11/21 0916  Dressing Intervention New dressing 03/11/21 0916  Dressing Change Due 03/18/21 03/11/21 0916       Jerome Mccormick 03/11/2021, 9:17 AM

## 2021-03-12 ENCOUNTER — Encounter: Payer: Self-pay | Admitting: Nephrology

## 2021-03-12 LAB — CULTURE, BLOOD (SINGLE): Culture: NO GROWTH

## 2021-03-12 LAB — SURGICAL PATHOLOGY

## 2021-03-12 NOTE — Telephone Encounter (Signed)
Is this Dr. Kirke Corin patient ?(did initial consult and last to round in hospital) Happy to follow if needed

## 2021-03-12 NOTE — Telephone Encounter (Signed)
Dr. Kirke Corin, please advise regarding in office appointment for our clinic.   Thank you!

## 2021-03-12 NOTE — Telephone Encounter (Signed)
Iran Ouch, MD [phone])  Sent: Tue March 12, 2021 4:57 PM  To: Jefferey Pica, RN  Cc: Jarvis Newcomer, RN       Message  Follow up in 2-3 weeks .     To scheduling to arrange for an office visit with Dr. Kirke Corin APP in 2-3 weeks.

## 2021-03-13 NOTE — Telephone Encounter (Signed)
Message fwd to scheduling to contact the patient to schedule and appt  In our office in 2-3 weeks.

## 2021-03-14 LAB — CULTURE, BLOOD (ROUTINE X 2)
Culture: NO GROWTH
Culture: NO GROWTH
Special Requests: ADEQUATE

## 2021-03-16 ENCOUNTER — Inpatient Hospital Stay: Payer: Medicaid Other

## 2021-03-16 ENCOUNTER — Emergency Department: Payer: Medicaid Other

## 2021-03-16 ENCOUNTER — Other Ambulatory Visit: Payer: Self-pay

## 2021-03-16 ENCOUNTER — Inpatient Hospital Stay
Admission: EM | Admit: 2021-03-16 | Discharge: 2021-03-19 | DRG: 291 | Disposition: A | Discharge status: Other Acute Inpatient Hospital | Payer: Medicaid Other | Attending: Internal Medicine | Admitting: Internal Medicine

## 2021-03-16 DIAGNOSIS — I110 Hypertensive heart disease with heart failure: Secondary | ICD-10-CM | POA: Diagnosis not present

## 2021-03-16 DIAGNOSIS — N008 Acute nephritic syndrome with other morphologic changes: Secondary | ICD-10-CM | POA: Diagnosis present

## 2021-03-16 DIAGNOSIS — I340 Nonrheumatic mitral (valve) insufficiency: Secondary | ICD-10-CM | POA: Diagnosis present

## 2021-03-16 DIAGNOSIS — E875 Hyperkalemia: Secondary | ICD-10-CM | POA: Diagnosis present

## 2021-03-16 DIAGNOSIS — I341 Nonrheumatic mitral (valve) prolapse: Secondary | ICD-10-CM | POA: Diagnosis present

## 2021-03-16 DIAGNOSIS — B952 Enterococcus as the cause of diseases classified elsewhere: Secondary | ICD-10-CM | POA: Diagnosis present

## 2021-03-16 DIAGNOSIS — Z20822 Contact with and (suspected) exposure to covid-19: Secondary | ICD-10-CM | POA: Diagnosis present

## 2021-03-16 DIAGNOSIS — F1290 Cannabis use, unspecified, uncomplicated: Secondary | ICD-10-CM | POA: Diagnosis present

## 2021-03-16 DIAGNOSIS — M7989 Other specified soft tissue disorders: Secondary | ICD-10-CM | POA: Diagnosis not present

## 2021-03-16 DIAGNOSIS — F17210 Nicotine dependence, cigarettes, uncomplicated: Secondary | ICD-10-CM | POA: Diagnosis present

## 2021-03-16 DIAGNOSIS — I82621 Acute embolism and thrombosis of deep veins of right upper extremity: Secondary | ICD-10-CM

## 2021-03-16 DIAGNOSIS — I330 Acute and subacute infective endocarditis: Secondary | ICD-10-CM | POA: Diagnosis present

## 2021-03-16 DIAGNOSIS — N179 Acute kidney failure, unspecified: Secondary | ICD-10-CM | POA: Diagnosis present

## 2021-03-16 DIAGNOSIS — R7881 Bacteremia: Secondary | ICD-10-CM | POA: Diagnosis present

## 2021-03-16 DIAGNOSIS — R601 Generalized edema: Secondary | ICD-10-CM

## 2021-03-16 DIAGNOSIS — I5031 Acute diastolic (congestive) heart failure: Secondary | ICD-10-CM | POA: Diagnosis present

## 2021-03-16 DIAGNOSIS — T82868A Thrombosis of vascular prosthetic devices, implants and grafts, initial encounter: Secondary | ICD-10-CM | POA: Diagnosis not present

## 2021-03-16 DIAGNOSIS — N5082 Scrotal pain: Secondary | ICD-10-CM | POA: Diagnosis present

## 2021-03-16 DIAGNOSIS — N048 Nephrotic syndrome with other morphologic changes: Secondary | ICD-10-CM | POA: Diagnosis present

## 2021-03-16 DIAGNOSIS — G43909 Migraine, unspecified, not intractable, without status migrainosus: Secondary | ICD-10-CM | POA: Diagnosis present

## 2021-03-16 DIAGNOSIS — I5033 Acute on chronic diastolic (congestive) heart failure: Secondary | ICD-10-CM

## 2021-03-16 DIAGNOSIS — D509 Iron deficiency anemia, unspecified: Secondary | ICD-10-CM | POA: Diagnosis present

## 2021-03-16 DIAGNOSIS — I511 Rupture of chordae tendineae, not elsewhere classified: Secondary | ICD-10-CM | POA: Diagnosis present

## 2021-03-16 DIAGNOSIS — Z79899 Other long term (current) drug therapy: Secondary | ICD-10-CM | POA: Diagnosis not present

## 2021-03-16 DIAGNOSIS — Y848 Other medical procedures as the cause of abnormal reaction of the patient, or of later complication, without mention of misadventure at the time of the procedure: Secondary | ICD-10-CM | POA: Diagnosis not present

## 2021-03-16 DIAGNOSIS — F17200 Nicotine dependence, unspecified, uncomplicated: Secondary | ICD-10-CM | POA: Diagnosis present

## 2021-03-16 DIAGNOSIS — Z862 Personal history of diseases of the blood and blood-forming organs and certain disorders involving the immune mechanism: Secondary | ICD-10-CM

## 2021-03-16 DIAGNOSIS — N009 Acute nephritic syndrome with unspecified morphologic changes: Secondary | ICD-10-CM

## 2021-03-16 DIAGNOSIS — R609 Edema, unspecified: Secondary | ICD-10-CM

## 2021-03-16 DIAGNOSIS — R52 Pain, unspecified: Secondary | ICD-10-CM

## 2021-03-16 HISTORY — DX: Unspecified nephritic syndrome with unspecified morphologic changes: N05.9

## 2021-03-16 LAB — CBC WITH DIFFERENTIAL/PLATELET
Abs Immature Granulocytes: 0.09 10*3/uL — ABNORMAL HIGH (ref 0.00–0.07)
Basophils Absolute: 0 10*3/uL (ref 0.0–0.1)
Basophils Relative: 1 %
Eosinophils Absolute: 0.1 10*3/uL (ref 0.0–0.5)
Eosinophils Relative: 2 %
HCT: 27.9 % — ABNORMAL LOW (ref 39.0–52.0)
Hemoglobin: 8.6 g/dL — ABNORMAL LOW (ref 13.0–17.0)
Immature Granulocytes: 1 %
Lymphocytes Relative: 15 %
Lymphs Abs: 1.2 10*3/uL (ref 0.7–4.0)
MCH: 27.5 pg (ref 26.0–34.0)
MCHC: 30.8 g/dL (ref 30.0–36.0)
MCV: 89.1 fL (ref 80.0–100.0)
Monocytes Absolute: 0.6 10*3/uL (ref 0.1–1.0)
Monocytes Relative: 7 %
Neutro Abs: 6 10*3/uL (ref 1.7–7.7)
Neutrophils Relative %: 74 %
Platelets: 218 10*3/uL (ref 150–400)
RBC: 3.13 MIL/uL — ABNORMAL LOW (ref 4.22–5.81)
RDW: 14.9 % (ref 11.5–15.5)
WBC: 8 10*3/uL (ref 4.0–10.5)
nRBC: 0 % (ref 0.0–0.2)

## 2021-03-16 LAB — URINALYSIS, ROUTINE W REFLEX MICROSCOPIC
Bacteria, UA: NONE SEEN
Bilirubin Urine: NEGATIVE
Glucose, UA: NEGATIVE mg/dL
Ketones, ur: NEGATIVE mg/dL
Nitrite: NEGATIVE
Protein, ur: >=300 mg/dL — AB
RBC / HPF: >50 RBC/hpf — ABNORMAL HIGH (ref 0–5)
Specific Gravity, Urine: 1.013 (ref 1.005–1.030)
WBC, UA: >50 WBC/hpf — ABNORMAL HIGH (ref 0–5)
pH: 5 (ref 5.0–8.0)

## 2021-03-16 LAB — COMPREHENSIVE METABOLIC PANEL
ALT: 23 U/L (ref 0–44)
AST: 27 U/L (ref 15–41)
Albumin: 2.6 g/dL — ABNORMAL LOW (ref 3.5–5.0)
Alkaline Phosphatase: 33 U/L — ABNORMAL LOW (ref 38–126)
Anion gap: 8 (ref 5–15)
BUN: 69 mg/dL — ABNORMAL HIGH (ref 6–20)
CO2: 23 mmol/L (ref 22–32)
Calcium: 8.9 mg/dL (ref 8.9–10.3)
Chloride: 107 mmol/L (ref 98–111)
Creatinine, Ser: 3.26 mg/dL — ABNORMAL HIGH (ref 0.61–1.24)
GFR, Estimated: 25 mL/min — ABNORMAL LOW (ref >60)
Glucose, Bld: 97 mg/dL (ref 70–99)
Potassium: 5.7 mmol/L — ABNORMAL HIGH (ref 3.5–5.1)
Sodium: 138 mmol/L (ref 135–145)
Total Bilirubin: 0.5 mg/dL (ref 0.3–1.2)
Total Protein: 6.3 g/dL — ABNORMAL LOW (ref 6.5–8.1)

## 2021-03-16 LAB — PROTEIN / CREATININE RATIO, URINE
Creatinine, Urine: 64 mg/dL
Protein Creatinine Ratio: 5.44 mg/mg{Cre} — ABNORMAL HIGH (ref 0.00–0.15)
Total Protein, Urine: 348 mg/dL

## 2021-03-16 LAB — RESP PANEL BY RT-PCR (FLU A&B, COVID) ARPGX2
Influenza A by PCR: NEGATIVE
Influenza B by PCR: NEGATIVE
SARS Coronavirus 2 by RT PCR: NEGATIVE

## 2021-03-16 LAB — BRAIN NATRIURETIC PEPTIDE: B Natriuretic Peptide: 570.3 pg/mL — ABNORMAL HIGH (ref 0.0–100.0)

## 2021-03-16 LAB — TROPONIN I (HIGH SENSITIVITY): Troponin I (High Sensitivity): 8 ng/L (ref <18)

## 2021-03-16 MED ORDER — TRAZODONE HCL 50 MG PO TABS
25.0000 mg | ORAL_TABLET | Freq: Every evening | ORAL | Status: DC | PRN
  Administered 2021-03-16: 25 mg via ORAL
  Filled 2021-03-16 (×2): qty 1

## 2021-03-16 MED ORDER — ACETAMINOPHEN 325 MG PO TABS: 650.0000 mg | ORAL_TABLET | ORAL | Status: DC | PRN

## 2021-03-16 MED ORDER — BUTALBITAL-APAP-CAFFEINE 50-325-40 MG PO TABS
1.0000 | ORAL_TABLET | ORAL | Status: DC | PRN
  Administered 2021-03-16 – 2021-03-19 (×6): 1 via ORAL
  Filled 2021-03-16 (×6): qty 1

## 2021-03-16 MED ORDER — AMPICILLIN IV (FOR PTA / DISCHARGE USE ONLY): 8.0000 g | Freq: Every day | INTRAVENOUS | Status: DC

## 2021-03-16 MED ORDER — SODIUM CHLORIDE 0.9% FLUSH
3.0000 mL | Freq: Two times a day (BID) | INTRAVENOUS | Status: DC
  Administered 2021-03-16 – 2021-03-19 (×6): 3 mL via INTRAVENOUS

## 2021-03-16 MED ORDER — ACETAMINOPHEN 325 MG PO TABS: 650.0000 mg | ORAL_TABLET | Freq: Four times a day (QID) | ORAL | Status: DC | PRN

## 2021-03-16 MED ORDER — ONDANSETRON HCL 4 MG/2ML IJ SOLN
4.0000 mg | Freq: Once | INTRAMUSCULAR | Status: AC
  Administered 2021-03-16: 4 mg via INTRAVENOUS
  Filled 2021-03-16: qty 2

## 2021-03-16 MED ORDER — SODIUM CHLORIDE 0.9 % IV SOLN
2.0000 g | Freq: Two times a day (BID) | INTRAVENOUS | Status: DC
  Administered 2021-03-16 – 2021-03-19 (×7): 2 g via INTRAVENOUS
  Filled 2021-03-16 (×3): qty 2
  Filled 2021-03-16 (×2): qty 20
  Filled 2021-03-16 (×2): qty 2
  Filled 2021-03-16 (×2): qty 20

## 2021-03-16 MED ORDER — CEFTRIAXONE IV (FOR PTA / DISCHARGE USE ONLY): 2.0000 g | Freq: Two times a day (BID) | INTRAVENOUS | Status: DC

## 2021-03-16 MED ORDER — FUROSEMIDE 10 MG/ML IJ SOLN
80.0000 mg | Freq: Two times a day (BID) | INTRAMUSCULAR | Status: DC
  Administered 2021-03-16 – 2021-03-18 (×4): 80 mg via INTRAVENOUS
  Filled 2021-03-16 (×4): qty 8

## 2021-03-16 MED ORDER — SODIUM ZIRCONIUM CYCLOSILICATE 10 G PO PACK
10.0000 g | PACK | Freq: Once | ORAL | Status: AC
  Administered 2021-03-16: 10 g via ORAL
  Filled 2021-03-16: qty 1

## 2021-03-16 MED ORDER — NICOTINE 14 MG/24HR TD PT24
14.0000 mg | MEDICATED_PATCH | Freq: Every day | TRANSDERMAL | Status: DC
  Administered 2021-03-16 – 2021-03-19 (×4): 14 mg via TRANSDERMAL
  Filled 2021-03-16 (×4): qty 1

## 2021-03-16 MED ORDER — INSULIN ASPART 100 UNIT/ML ~~LOC~~ SOLN
SUBCUTANEOUS | Status: AC
  Administered 2021-03-16: 10 [IU] via INTRAVENOUS
  Filled 2021-03-16: qty 1

## 2021-03-16 MED ORDER — ALBUMIN HUMAN 25 % IV SOLN
25.0000 g | Freq: Two times a day (BID) | INTRAVENOUS | Status: AC
  Administered 2021-03-16 – 2021-03-18 (×5): 25 g via INTRAVENOUS
  Filled 2021-03-16 (×10): qty 100

## 2021-03-16 MED ORDER — SODIUM CHLORIDE 0.9 % IV SOLN
2.0000 g | Freq: Three times a day (TID) | INTRAVENOUS | Status: DC
  Administered 2021-03-16 – 2021-03-17 (×3): 2 g via INTRAVENOUS
  Filled 2021-03-16 (×2): qty 2
  Filled 2021-03-16: qty 2000
  Filled 2021-03-16: qty 2

## 2021-03-16 MED ORDER — MELATONIN 5 MG PO TABS
2.5000 mg | ORAL_TABLET | Freq: Every day | ORAL | Status: DC
  Administered 2021-03-17 – 2021-03-18 (×2): 2.5 mg via ORAL
  Filled 2021-03-16 (×2): qty 1

## 2021-03-16 MED ORDER — SODIUM CHLORIDE 0.9 % IV SOLN
250.0000 mL | INTRAVENOUS | Status: DC | PRN
  Administered 2021-03-18: 250 mL via INTRAVENOUS

## 2021-03-16 MED ORDER — INSULIN ASPART 100 UNIT/ML IV SOLN
10.0000 [IU] | Freq: Once | INTRAVENOUS | Status: AC
  Filled 2021-03-16: qty 0.1

## 2021-03-16 MED ORDER — SODIUM CHLORIDE 0.9% FLUSH: 3.0000 mL | INTRAVENOUS | Status: DC | PRN

## 2021-03-16 MED ORDER — METOPROLOL SUCCINATE ER 25 MG PO TB24
25.0000 mg | ORAL_TABLET | Freq: Every day | ORAL | Status: DC
  Administered 2021-03-16 – 2021-03-17 (×2): 25 mg via ORAL
  Filled 2021-03-16 (×2): qty 1

## 2021-03-16 MED ORDER — FENTANYL CITRATE (PF) 100 MCG/2ML IJ SOLN
50.0000 ug | Freq: Once | INTRAMUSCULAR | Status: AC
  Administered 2021-03-16: 50 ug via INTRAVENOUS
  Filled 2021-03-16: qty 2

## 2021-03-16 MED ORDER — ENOXAPARIN SODIUM 30 MG/0.3ML ~~LOC~~ SOLN
30.0000 mg | SUBCUTANEOUS | Status: DC
  Administered 2021-03-16: 30 mg via SUBCUTANEOUS
  Filled 2021-03-16: qty 0.3

## 2021-03-16 MED ORDER — FUROSEMIDE 10 MG/ML IJ SOLN
80.0000 mg | Freq: Once | INTRAMUSCULAR | Status: AC
  Administered 2021-03-16: 80 mg via INTRAVENOUS
  Filled 2021-03-16: qty 8

## 2021-03-16 MED ORDER — ONDANSETRON HCL 4 MG/2ML IJ SOLN
4.0000 mg | Freq: Four times a day (QID) | INTRAMUSCULAR | Status: DC | PRN
  Administered 2021-03-16 – 2021-03-19 (×5): 4 mg via INTRAVENOUS
  Filled 2021-03-16 (×6): qty 2

## 2021-03-16 MED ORDER — DEXTROSE 50 % IV SOLN
1.0000 | Freq: Once | INTRAVENOUS | Status: AC
  Administered 2021-03-16: 50 mL via INTRAVENOUS
  Filled 2021-03-16: qty 50

## 2021-03-16 NOTE — ED Notes (Signed)
Pt to ultrasound at this time.

## 2021-03-16 NOTE — ED Notes (Signed)
Called pharmacy to inquire about the status of the albumin- they asked if someone could come get it- this RN and Pharmacist, hospital went to collect albumin

## 2021-03-16 NOTE — ED Notes (Signed)
Message sent to pharmacy for albumin

## 2021-03-16 NOTE — ED Notes (Signed)
Lab called to obtain blood cultures.

## 2021-03-16 NOTE — Consult Note (Signed)
Cardiology Consultation:   Patient ID: Jerome Mccormick MRN: [id number]; DOB: [birth year]  Admit date: 03/16/2021 Date of Consult: 03/16/2021  PCP:  Patient, No Pcp Per (Inactive)   [facility]  Cardiologist:  Debbe Odea, MD  Advanced Practice Provider:  No care team member to display Electrophysiologist:  None   Patient Profile:   Jerome Mccormick is a 30 y.o. male with a hx of mitral valve prolapse /moderate  MR, tobacco use, previous alcohol abuse, migraines, who is being seen today for the evaluation of acute on chronic diastolic heart failure at the request of Dr. Joylene Igo.  History of Present Illness:   Jerome Mccormick is a 30 year old male with PMH as above.  Seen 03/02/21 for moderate MR/mitral chordae in the setting of bacteremia with ARF felt to be 2/2 Enterococcus.   03/02/2021 echo showed EF 60 to 65%, NRWMA, RVSP 38.3 mmHg, moderate LAE, abnormal mitral valve with posterior leaflet prolapse.  Echo was unable to exclude partially flail leaflet or torn chordae.  Moderate eccentric MR without evidence of stenosis. RAP .  03/06/21 TEE showed EF 60 to 65%, NR WMA, ruptured posterior chordae of mitral valve, abnormal mitral valve, mild to moderate MR, mild holosystolic prolapse of both leaflets of the mitral valve.    There was no clear evidence for endocarditis identified on TEE.  It was thought that the likely etiology of ruptured chordae including mitral valve prolapse or endocarditis, as the patient was bacteremic at that time.  Treatment was recommended for endocarditis.  Surgical consult was recommended for mitral valve repair/replacement.  It was noted that this could be obtained in the outpatient setting, as the patient was hemodynamically stable and not heart failure.  Appointment scheduled with Dr. Porfirio Oar 03/20/2021.  After discharge, the patient reported worsening SOB/DOE> bilateral lower extremity edema.  He reported IV antibiotics at home and that he  is compliant with his torsemide. Cardiology consulted.   Past Medical History:  Diagnosis Date  . Allergy   . Headache     Past Surgical History:  Procedure Laterality Date  . TEE WITHOUT CARDIOVERSION N/A 03/06/2021   Procedure: TRANSESOPHAGEAL ECHOCARDIOGRAM (TEE);  Surgeon: Debbe Odea, MD;  Location: [facility] ORS;  Service: Cardiovascular;  Laterality: N/A;     Home Medications:  Prior to Admission medications   Medication Sig Start Date End Date Taking? Authorizing Provider  acetaminophen (TYLENOL) 325 MG tablet Take 2 tablets (650 mg total) by mouth every 6 (six) hours as needed for mild pain, moderate pain, fever or headache (or Fever >/= 101). 03/11/21   Charise Killian, MD  ampicillin IVPB Inject 8 g into the vein continuous. Infuse ampicillin 8gm daily over 24h as continuous infusion Indication: Enterococcal endocarditis First Dose: Yes Last Day of Therapy:  04/16/2021 Labs - Once weekly:  CBC/D, CMP, and CRP  Method of administration: Ambulatory Pump (Continuous Infusion) Method of administration may be changed at the discretion of home infusion pharmacist based upon assessment of the patient and/or caregiver's ability to self-administer the medication ordered. 03/11/21 04/16/21  Charise Killian, MD  cefTRIAXone (ROCEPHIN) IVPB Inject 2 g into the vein every 12 (twelve) hours. Indication: Enterococcal endocarditis First Dose: Yes Last Day of Therapy:  04/16/2021 Labs - Once weekly:  CBC/D, CMP, and CRP  Method of administration: IV Push Method of administration may be changed at the discretion of home infusion pharmacist based upon assessment of the patient and/or caregiver's ability to self-administer the medication ordered. 03/11/21 04/16/21  Charise Killian, MD    Inpatient Medications: Scheduled Meds: . dextrose  1 ampule Intravenous Once  . enoxaparin (LOVENOX) injection  30 mg Subcutaneous Q24H  . furosemide  80 mg Intravenous Q12H  . insulin aspart  10  Units Intravenous Once  . metoprolol succinate  25 mg Oral Daily  . sodium chloride flush  3 mL Intravenous Q12H  . sodium zirconium cyclosilicate  10 g Oral Once   Continuous Infusions: . sodium chloride    . albumin human    . ampicillin (OMNIPEN) IV    . cefTRIAXone (ROCEPHIN)  IV     PRN Meds: sodium chloride, acetaminophen, butalbital-acetaminophen-caffeine, ondansetron (ZOFRAN) IV, sodium chloride flush  Allergies:   No Known Allergies  Social History:   Social History   Socioeconomic History  . Marital status: Single    Spouse name: Not on file  . Number of children: 0  . Years of education: 72  . Highest education level: Not on file  Occupational History  . Occupation: Restaurant work  Tobacco Use  . Smoking status: Current Every Day Smoker    Packs/day: 0.75    Types: Cigarettes  . Smokeless tobacco: Never Used  Substance and Sexual Activity  . Alcohol use: Yes    Comment: >100 oz per week - beer  . Drug use: Yes    Types: Marijuana  . Sexual activity: Not on file  Other Topics Concern  . Not on file  Social History Narrative   Jerome Mccormick grew up in [place], [place]. Currently unemployed. He enjoys fixing things around the house. He also enjoys hanging out with friends.   Social Determinants of Health   Financial Resource Strain: Not on file  Food Insecurity: Not on file  Transportation Needs: Not on file  Physical Activity: Not on file  Stress: Not on file  Social Connections: Not on file  Intimate Partner Violence: Not on file    Family History:   History reviewed. No pertinent family history.   ROS:  Please see the history of present illness.   All other ROS reviewed and negative.     Physical Exam/Data:   Vitals:   03/16/21 0800 03/16/21 0830 03/16/21 0900 03/16/21 0930  BP: (!) 141/90 (!) 140/91 (!) 152/101 (!) 144/94  Pulse: 90 (!) 102 100 (!) 102  Resp: (!) 30 (!) 28 (!) 26 (!) 22  Temp:      TempSrc:      SpO2: 99% 97% 92% 93%  Weight:       Height:       No intake or output data in the 24 hours ending 03/16/21 1155 Last 3 Weights 03/16/2021 03/06/2021 03/02/2021  Weight (lbs) 137 lb 132 lb 138 lb  Weight (kg) 62.143 kg 59.875 kg 62.596 kg     Body mass index is 21.46 kg/m.  General:  Well nourished, well developed, in no acute distress HEENT: normal Lymph: no adenopathy Neck: 7 cm JVD Endocrine:  No thryomegaly Vascular: No carotid bruits; FA pulses 2+ bilaterally without bruits  Cardiac:  normal S1, S2; RRR; 3/6 holosystolic high pitched MR murmur Lungs:  clear to auscultation bilaterally except for minimal basilar rales Abd: soft, nontender, no hepatomegaly  Ext: no edema Musculoskeletal:  No deformities, BUE and BLE strength normal and equal Skin: warm and dry  Neuro:  CNs 2-12 intact, no focal abnormalities noted Psych:  Normal affect   EKG:  The EKG was personally reviewed and demonstrates:  nsr at 98/min Telemetry:  Telemetry was personally reviewed and demonstrates:  nsr at 95-100/min  Relevant CV Studies: Transesophageal echo 4.6.2022  1. Left ventricular ejection fraction, by estimation, is 60 to 65%. The  left ventricle has normal function. The left ventricle has no regional  wall motion abnormalities.  2. Right ventricular systolic function is normal. The right ventricular  size is normal.  3. No left atrial/left atrial appendage thrombus was detected.  4. 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  The mitral valve is abnormal. Mild to moderate mitral valve regurgitation.  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  5. The aortic valve is tricuspid. Aortic valve regurgitation is not  visualized.  __________  2D echo 4.3.2022 1. Left ventricular ejection fraction, by estimation, is 60 to 65%. The  left ventricle has normal function. The left ventricle has no regional  wall motion abnormalities. Left ventricular diastolic parameters  were  normal.  2. Right ventricular systolic function is normal. The right ventricular  size is normal. There is mildly elevated pulmonary artery systolic  pressure. The estimated right ventricular systolic pressure is 38.3 mmHg.  3. Left atrial size was moderately dilated.  4. The mitral valve is abnormal.Posterior leaflet with prolapse, unable  to exclude partially flail leaflet or torn chordea (images 30-32) Moderate  eccentric mitral valve regurgitation. No evidence of mitral stenosis.  5. The inferior vena cava is dilated in size with >50% respiratory  variability, suggesting right atrial pressure of 8 mmHg.   Laboratory Data:  High Sensitivity Troponin:   Recent Labs  Lab 03/16/21 0658  TROPONINIHS 8     Chemistry Recent Labs  Lab 03/10/21 0540 03/11/21 0553 03/16/21 0658  NA 139 139 138  K 4.5 4.6 5.7*  CL 107 106 107  CO2 26 25 23   GLUCOSE 98 89 97  BUN 57* 52* 69*  CREATININE 3.13* 2.95* 3.26*  CALCIUM 8.1* 8.0* 8.9  GFRNONAA 26* 28* 25*  ANIONGAP 6 8 8     Recent Labs  Lab 03/10/21 0540 03/11/21 0553 03/16/21 0658  PROT  --   --  6.3*  ALBUMIN 2.4* 2.4* 2.6*  AST  --   --  27  ALT  --   --  23  ALKPHOS  --   --  33*  BILITOT  --   --  0.5   Hematology Recent Labs  Lab 03/11/21 0553 03/16/21 0658  WBC 7.4 8.0  RBC 3.00* 3.13*  HGB 8.4* 8.6*  HCT 26.3* 27.9*  MCV 87.7 89.1  MCH 28.0 27.5  MCHC 31.9 30.8  RDW 13.6 14.9  PLT 346 218   BNP Recent Labs  Lab 03/16/21 0658  BNP 570.3*    DDimer No results for input(s): DDIMER in the last 168 hours.   Radiology/Studies:  DG Chest 2 View  Result Date: 03/16/2021 CLINICAL DATA:  30 year old male with shortness of breath. Lower extremity swelling. EXAM: CHEST - 2 VIEW COMPARISON:  CT Chest, Abdomen, and Pelvis 07/02/2012 and earlier. FINDINGS: Right side PICC line in place. Lower lung volumes compared to 2009. Cardiac size and mediastinal contours remain normal. Visualized tracheal air  column is within normal limits. No pneumothorax, pleural effusion or consolidation. But mild to moderate diffuse increased pulmonary interstitial opacity with a basilar predominance compared to 2009. No acute osseous abnormality identified. Negative visible bowel gas pattern. IMPRESSION: 1. Lower lung volumes with diffusely increased increased pulmonary interstitial opacity. Favor pulmonary interstitial edema in light of  lower extremity swelling. Otherwise consider viral/atypical respiratory infection. 2. No pleural effusion.  PICC line in place. Electronically Signed   By: Odessa Fleming M.D.   On: 03/16/2021 07:44     Assessment and Plan:   1. Acute on chronic diastolic heart failure, R>L - he has been given a dose of lV lasix and had a nice urine output so far. We will reassess his volume status tomorrow. His symptoms are more low output than pulmonary edema. He has severe peripheral edema/anasarca. We will await repeat echo. If valve function has worsened, might consider a transfer [facility] for earlier surgical intervention. 2. MR - by exam he has significant MR. I suspect that it is torrential. Unclear if this is a change.  3. Nephrotic range proteinuria - this may be at least part of his edema. See nephrology note. He has an active sediment. 4. Enterococcal bacteremia - I wonder if he might have had a veg that embolized. It is hard to square both a cord rupture and enterococcal infection that are not related.   New York Heart Association (NYHA) Functional Class NYHA Class III   [facility] will follow    For questions or updates, please contact [facility] Please consult [email or web address] for contact info under    Signed, Lennon Alstrom, PA-C  03/16/2021 11:55 AM

## 2021-03-16 NOTE — ED Triage Notes (Signed)
Pt presents to ER c/o swelling of entire lower body that has been getting progressively worse since yesterday.  Pt states he was started on torsemide by his kidney Dr, but does not know why he is having the swelling.  Pt denies hx of CHF or kidney issues.  Pt does have PICC line in R arm with ampicillin infusing for IV abx for kidney infection.  PT states he is having pain in his arms, legs and head at this time.

## 2021-03-16 NOTE — Consult Note (Signed)
Pharmacy Antibiotic Note  Jerome Mccormick is a 30 y.o. 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 Pt was seen recently from 4/2 admission with moderate MR/mitral chordae in the setting of bacteremia with ARF felt to be 2/2 Enterococcus faecalis. Pt returning today with continuation of abx (CTX + AMP).  Pharmacy has been consulted for Ampicillin dosing.  Plan: Ampicillin 2g IV q8h (CTM renal fxn as borderline for q6h adjustment) With Ceftriaxone 2g IV q12h   Height: 5\' 7"  (170.2 cm) Weight: 62.1 kg (137 lb) IBW/kg (Calculated) : 66.1  Temp (24hrs), Avg:98.9 F (37.2 C), Min:98.9 F (37.2 C), Max:98.9 F (37.2 C)  Recent Labs  Lab 03/10/21 0540 03/11/21 0553 03/16/21 0658  WBC  --  7.4 8.0  CREATININE 3.13* 2.95* 3.26*    Estimated Creatinine Clearance: 29.1 mL/min (A) (by C-G formula based on SCr of 3.26 mg/dL (H)).    No Known Allergies  Antimicrobials this admission: Ampicillin (4/16 >>  Ceftriaxone (4/16 >>   Dose adjustments this admission: CTM renal fxn as borderline for q6h adjustment  Microbiology results: 4/5 BCx: Entrococcus Faecalis (Amp/Gent/Vanc-Sensitive) 4/16 BCx: sent  Thank you for allowing pharmacy to be a part of this patient's care.  5/16 Jerome Mccormick 03/16/2021 1:35 PM

## 2021-03-16 NOTE — Progress Notes (Signed)
[facility]  ROUNDING NOTE   Subjective:   Mr. Jerome Mccormick was admitted to [facility] on 03/16/2021 for Acute diastolic CHF (congestive heart failure) (HCC) [I50.31]  Patient's mother at bedside.   Patient was admitted to [facility] from 4/2 to 4/11 for acute glomerulonephritis. Found to have gross hematura. Anasarca and rashes. 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 He was found to have enterococcus faecalis in blood cultures. Sent home with IV antibiotics and PICC line.   Patient was discharged on PO torsemide but continued to have increasing peripheral and scrotal edema. He states his edema became painful and he returned to [facility].     Objective:  Vital signs in last 24 hours:  Temp:  [98.9 F (37.2 C)] 98.9 F (37.2 C) (04/16 4665) Pulse Rate:  [90-102] 102 (04/16 0930) Resp:  [18-30] 22 (04/16 0930) BP: (135-152)/(90-101) 144/94 (04/16 0930) SpO2:  [92 %-100 %] 93 % (04/16 0930) Weight:  [62.1 kg] 62.1 kg (04/16 0633)  Weight change:  Filed Weights   03/16/21 0633  Weight: 62.1 kg    Intake/Output: No intake/output data recorded.   Intake/Output this shift:  No intake/output data recorded.  Physical Exam: General: NAD,   Head: Normocephalic, atraumatic. Moist oral mucosal membranes  Eyes: Anicteric, PERRL  Neck: Supple, trachea midline  Lungs:  Clear to auscultation  Heart: Regular rate and rhythm  Abdomen:  Soft, nontender,   Extremities: +++ peripheral edema.  Neurologic: Nonfocal, moving all four extremities  Skin: No lesions  Access: none    Basic Metabolic Panel: Recent Labs  Lab 03/10/21 0540 03/11/21 0553 03/16/21 0658  NA 139 139 138  K 4.5 4.6 5.7*  CL 107 106 107  CO2 26 25 23   GLUCOSE 98 89 97  BUN 57* 52* 69*  CREATININE 3.13* 2.95* 3.26*  CALCIUM 8.1* 8.0* 8.9  PHOS 5.8* 5.5*  --     Liver Function Tests: Recent Labs  Lab 03/10/21 0540 03/11/21 0553 03/16/21 0658  AST  --   --  27  ALT  --   --  23   ALKPHOS  --   --  33*  BILITOT  --   --  0.5  PROT  --   --  6.3*  ALBUMIN 2.4* 2.4* 2.6*   No results for input(s): LIPASE, AMYLASE in the last 168 hours. No results for input(s): AMMONIA in the last 168 hours.  CBC: Recent Labs  Lab 03/11/21 0553 03/16/21 0658  WBC 7.4 8.0  NEUTROABS  --  6.0  HGB 8.4* 8.6*  HCT 26.3* 27.9*  MCV 87.7 89.1  PLT 346 218    Cardiac Enzymes: No results for input(s): CKTOTAL, CKMB, CKMBINDEX, TROPONINI in the last 168 hours.  BNP: Invalid input(s): POCBNP  CBG: No results for input(s): GLUCAP in the last 168 hours.  Microbiology: Results for orders placed or performed during the hospital encounter of 03/16/21  Resp Panel by RT-PCR (Flu A&B, Covid) Nasopharyngeal Swab     Status: None   Collection Time: 03/16/21  7:02 AM   Specimen: Nasopharyngeal Swab; Nasopharyngeal(NP) swabs in vial transport medium  Result Value Ref Range Status   SARS Coronavirus 2 by RT PCR NEGATIVE NEGATIVE Final    Comment: (NOTE) SARS-CoV-2 target nucleic acids are NOT DETECTED.  The SARS-CoV-2 RNA is generally detectable in upper respiratory specimens during the acute phase of infection. The lowest concentration of SARS-CoV-2 viral copies this assay can detect is 138 copies/mL. A negative result does  not preclude SARS-Cov-2 infection and should not be used as the sole basis for treatment or other patient management decisions. A negative result may occur with  improper specimen collection/handling, submission of specimen other than nasopharyngeal swab, presence of viral mutation(s) within the areas targeted by this assay, and inadequate number of viral copies(<138 copies/mL). A negative result must be combined with clinical observations, patient history, and epidemiological information. The expected result is Negative.  Fact Sheet for Patients:  BloggerCourse.com  Fact Sheet for Healthcare Providers:   SeriousBroker.it  This test is no t yet approved or cleared by the [place] FDA and  has been authorized for detection and/or diagnosis of SARS-CoV-2 by FDA under an Emergency Use Authorization (EUA). This EUA will remain  in effect (meaning this test can be used) for the duration of the COVID-19 declaration under Section 564(b)(1) of the Act, 21 U.S.C.section 360bbb-3(b)(1), unless the authorization is terminated  or revoked sooner.       Influenza A by PCR NEGATIVE NEGATIVE Final   Influenza B by PCR NEGATIVE NEGATIVE Final    Comment: (NOTE) The Xpert Xpress SARS-CoV-2/FLU/RSV plus assay is intended as an aid in the diagnosis of influenza from Nasopharyngeal swab specimens and should not be used as a sole basis for treatment. Nasal washings and aspirates are unacceptable for Xpert Xpress SARS-CoV-2/FLU/RSV testing.  Fact Sheet for Patients: BloggerCourse.com  Fact Sheet for Healthcare Providers: SeriousBroker.it  This test is not yet approved or cleared by the [place] FDA and has been authorized for detection and/or diagnosis of SARS-CoV-2 by FDA under an Emergency Use Authorization (EUA). This EUA will remain in effect (meaning this test can be used) for the duration of the COVID-19 declaration under Section 564(b)(1) of the Act, 21 U.S.C. section 360bbb-3(b)(1), unless the authorization is terminated or revoked.  Performed at [facility], [address] [place], [place] [address]     Coagulation Studies: No results for input(s): LABPROT, INR in the last 72 hours.  Urinalysis: Recent Labs    03/16/21 1056  COLORURINE YELLOW*  LABSPEC 1.013  PHURINE 5.0  GLUCOSEU NEGATIVE  HGBUR LARGE*  BILIRUBINUR NEGATIVE  KETONESUR NEGATIVE  PROTEINUR >=300*  NITRITE NEGATIVE  LEUKOCYTESUR SMALL*      Imaging: DG Chest 2 View  Result Date: 03/16/2021 CLINICAL DATA:   30 year old male with shortness of breath. Lower extremity swelling. EXAM: CHEST - 2 VIEW COMPARISON:  CT Chest, Abdomen, and Pelvis 07/02/2012 and earlier. FINDINGS: Right side PICC line in place. Lower lung volumes compared to 2009. Cardiac size and mediastinal contours remain normal. Visualized tracheal air column is within normal limits. No pneumothorax, pleural effusion or consolidation. But mild to moderate diffuse increased pulmonary interstitial opacity with a basilar predominance compared to 2009. No acute osseous abnormality identified. Negative visible bowel gas pattern. IMPRESSION: 1. Lower lung volumes with diffusely increased increased pulmonary interstitial opacity. Favor pulmonary interstitial edema in light of lower extremity swelling. Otherwise consider viral/atypical respiratory infection. 2. No pleural effusion.  PICC line in place. Electronically Signed   By: Odessa Fleming M.D.   On: 03/16/2021 07:44     Medications:   . sodium chloride    . albumin human    . ampicillin (OMNIPEN) IV    . cefTRIAXone (ROCEPHIN)  IV     . dextrose  1 ampule Intravenous Once  . enoxaparin (LOVENOX) injection  30 mg Subcutaneous Q24H  . furosemide  80 mg Intravenous Q12H  . insulin aspart  10 Units  Intravenous Once  . metoprolol succinate  25 mg Oral Daily  . sodium chloride flush  3 mL Intravenous Q12H  . sodium zirconium cyclosilicate  10 g Oral Once   sodium chloride, acetaminophen, butalbital-acetaminophen-caffeine, ondansetron (ZOFRAN) IV, sodium chloride flush  Assessment/ Plan:  Mr. SENICA CRALL is a 30 y.o. white male with no past medical history with post infectious glomerulonephritis biopsy proven admitted to [facility] on 03/16/2021 for Acute diastolic CHF (congestive heart failure) (HCC) [I50.31]  Father was on dialysis. Patient is a tobacco and THC smoker. Had TCA in urine tox screen.   1. Acute kidney injury: post infectious glomerulonephritis 2. Anasarca 3. Nephrotic range  proteinuria 4. Hematuria 5. Hypertension  Patient has failed outpatient diuretics.  - IV furosemide - Urine studies - dopplers: looking for thrombosis - recheck serum complements.   - Continue IV antibiotics.     LOS: 0 Natayla Cadenhead 4/16/202211:53 AM

## 2021-03-16 NOTE — ED Notes (Signed)
Rn notified bed assigned

## 2021-03-16 NOTE — ED Notes (Signed)
Messaged nephrology MD Kolluru regarding lasix orders at this time. Awaiting new orders at this time.

## 2021-03-16 NOTE — ED Notes (Signed)
Nephrology at bedside

## 2021-03-16 NOTE — ED Provider Notes (Signed)
MSE was initiated and I personally evaluated the patient and placed orders (if any) at  6:40 AM  on March 16, 2021.  The patient appears stable so that the remainder of the MSE may be completed by another provider.  Patient here with increasing lower extremity edema for the past several days.  Also complaining of shortness of breath.  No chest pain.  On torsemide 20 mg daily without relief.  Was recently admitted to the hospital 03/02/2021 - 03/11/2021 for AKI, bacteremia growing Enterococcus faecalis, endocarditis currently receiving 24-hour infusion of ampicillin through a right upper extremity PICC line.  Labs, EKG, chest x-ray ordered.   Jerome Mccormick, Layla Maw, DO 03/16/21 [phone]

## 2021-03-16 NOTE — H&P (Addendum)
History and Physical    Jerome Mccormick EAV:[id number] DOB: 10-Jul-1991 DOA: 03/16/2021  PCP: Patient, No Pcp Per (Inactive)   Patient coming from: Home  I have personally briefly reviewed patient[facility]  Chief Complaint: Bilateral lower extremity swelling  HPI: Jerome Mccormick is a 30 y.o. male with medical history significant for recent diagnosis of enterococcal endocarditis discharged home on IV antibiotics (Rocephin 2 g IV every 12 and ampicillin 8 g IV daily for 6 weeks, last dose 04/16/21), history of migraine headaches as well as postinfectious glomerulonephritis who was discharged from the hospital 5 days prior to his presentation today with complaints of worsening lower extremity swelling. Patient states that he notices worsening swelling since his discharge especially after he started getting his IV antibiotics at home.  He has been compliant with his torsemide as well as his diuretics. In addition to lower extremity swelling he has shortness of breath with exertion but denies having any orthopnea or paroxysmal nocturnal dyspnea.  He complains of a headache and states that he has a history of migraine headaches. He denies having any chest pain, no nausea, no vomiting, no diaphoresis, no palpitations, no urinary frequency, no nocturia, no dysuria, no fever, no chills, no cough, no dizziness, no lightheadedness, no abdominal pain, no changes in his bowel habits, no blurred vision or focal deficits. Labs show sodium 138, potassium 5.7, chloride 107, bicarb 23, glucose 97, BUN 69, creatinine 3.26 above 2.95 on his discharge, calcium 8.9, alkaline phosphatase 33, albumin 2.6, AST 27, ALT 23, total protein 6.3, BNP 570, white count 8.0, hemoglobin 8.6, hematocrit 27.9, MCV 89.1, RDW 14.9, platelet count 218 Respiratory viral panel is negative Chest x-ray reviewed by me shows lower lung volumes with diffusely increased increased pulmonary interstitial opacity.  Favor pulmonary interstitial edema in light of lower extremity swelling. Otherwise consider viral/atypical respiratory infection. No pleural effusion.  PICC line in place. Twelve-lead EKG reviewed by me shows sinus rhythm    ED Course: Patient is a 30 year old Caucasian male recently diagnosed with enterococcal endocarditis and postinfectious glomerulonephritis.  He presents to the ER for evaluation of anasarca.  He received a dose of Lasix in the emergency room and will be admitted for further evaluation and treatment.  Review of Systems: As per HPI otherwise all other systems reviewed and negative.    Past Medical History:  Diagnosis Date  . Allergy   . Headache     Past Surgical History:  Procedure Laterality Date  . TEE WITHOUT CARDIOVERSION N/A 03/06/2021   Procedure: TRANSESOPHAGEAL ECHOCARDIOGRAM (TEE);  Surgeon: Debbe OdeaAgbor-Etang, Brian, MD;  Location: [facility] ORS;  Service: Cardiovascular;  Laterality: N/A;     reports that he has been smoking cigarettes. He has been smoking about 0.75 packs per day. He has never used smokeless tobacco. He reports current alcohol use. He reports current drug use. Drug: Marijuana.  No Known Allergies  History reviewed. No pertinent family history.    Prior to Admission medications   Medication Sig Start Date End Date Taking? Authorizing Provider  acetaminophen (TYLENOL) 325 MG tablet Take 2 tablets (650 mg total) by mouth every 6 (six) hours as needed for mild pain, moderate pain, fever or headache (or Fever >/= 101). 03/11/21   Charise KillianWilliams, Jamiese M, MD  ampicillin IVPB Inject 8 g into the vein continuous. Infuse ampicillin 8gm daily over 24h as continuous infusion Indication: Enterococcal endocarditis First Dose: Yes Last Day of Therapy:  04/16/2021 Labs - Once weekly:  CBC/D, CMP, and CRP  Method of administration: Ambulatory Pump (Continuous Infusion) Method of administration may be changed at the discretion of home infusion pharmacist based upon  assessment of the patient and/or caregiver's ability to self-administer the medication ordered. 03/11/21 04/16/21  Charise Killian, MD  cefTRIAXone (ROCEPHIN) IVPB Inject 2 g into the vein every 12 (twelve) hours. Indication: Enterococcal endocarditis First Dose: Yes Last Day of Therapy:  04/16/2021 Labs - Once weekly:  CBC/D, CMP, and CRP  Method of administration: IV Push Method of administration may be changed at the discretion of home infusion pharmacist based upon assessment of the patient and/or caregiver's ability to self-administer the medication ordered. 03/11/21 04/16/21  Charise Killian, MD    Physical Exam: Vitals:   03/16/21 0800 03/16/21 0830 03/16/21 0900 03/16/21 0930  BP: (!) 141/90 (!) 140/91 (!) 152/101 (!) 144/94  Pulse: 90 (!) 102 100 (!) 102  Resp: (!) 30 (!) 28 (!) 26 (!) 22  Temp:      TempSrc:      SpO2: 99% 97% 92% 93%  Weight:      Height:         Vitals:   03/16/21 0800 03/16/21 0830 03/16/21 0900 03/16/21 0930  BP: (!) 141/90 (!) 140/91 (!) 152/101 (!) 144/94  Pulse: 90 (!) 102 100 (!) 102  Resp: (!) 30 (!) 28 (!) 26 (!) 22  Temp:      TempSrc:      SpO2: 99% 97% 92% 93%  Weight:      Height:          Constitutional: Alert and oriented x 3.  Appears uncomfortable and in no distress  HEENT:      Head: Normocephalic and atraumatic.         Eyes: PERLA, EOMI, Conjunctivae pallor. Sclera is non-icteric.       Mouth/Throat: Mucous membranes are moist.       Neck: Supple with no signs of meningismus. Cardiovascular: Regular rate and rhythm. No murmurs, gallops, or rubs. 2+ symmetrical distal pulses are present . No JVD. 4+ LE edema Respiratory: Respiratory effort normal.  Crackles at the bases bilaterally. No wheezes or rhonchi.  Gastrointestinal: Soft, non tender, and non distended with positive bowel sounds.  Genitourinary: No CVA tenderness. Musculoskeletal: Nontender with normal range of motion in all extremities. No cyanosis, or erythema  of extremities. Neurologic:  Face is symmetric. Moving all extremities. No gross focal neurologic deficits  Skin: Skin is warm, dry.  No rash or ulcers Psychiatric: Mood and affect are normal   Labs on Admission: I have personally reviewed following labs and imaging studies  CBC: Recent Labs  Lab 03/11/21 0553 03/16/21 0658  WBC 7.4 8.0  NEUTROABS  --  6.0  HGB 8.4* 8.6*  HCT 26.3* 27.9*  MCV 87.7 89.1  PLT 346 218   Basic Metabolic Panel: Recent Labs  Lab 03/10/21 0540 03/11/21 0553 03/16/21 0658  NA 139 139 138  K 4.5 4.6 5.7*  CL 107 106 107  CO2 26 25 23   GLUCOSE 98 89 97  BUN 57* 52* 69*  CREATININE 3.13* 2.95* 3.26*  CALCIUM 8.1* 8.0* 8.9  PHOS 5.8* 5.5*  --    GFR: Estimated Creatinine Clearance: 29.1 mL/min (A) (by C-G formula based on SCr of 3.26 mg/dL (H)). Liver Function Tests: Recent Labs  Lab 03/10/21 0540 03/11/21 0553 03/16/21 0658  AST  --   --  27  ALT  --   --  23  ALKPHOS  --   --  33*  BILITOT  --   --  0.5  PROT  --   --  6.3*  ALBUMIN 2.4* 2.4* 2.6*   No results for input(s): LIPASE, AMYLASE in the last 168 hours. No results for input(s): AMMONIA in the last 168 hours. Coagulation Profile: Recent Labs  Lab 03/11/21 0553  INR 1.3*   Cardiac Enzymes: No results for input(s): CKTOTAL, CKMB, CKMBINDEX, TROPONINI in the last 168 hours. BNP (last 3 results) No results for input(s): PROBNP in the last 8760 hours. HbA1C: No results for input(s): HGBA1C in the last 72 hours. CBG: No results for input(s): GLUCAP in the last 168 hours. Lipid Profile: No results for input(s): CHOL, HDL, LDLCALC, TRIG, CHOLHDL, LDLDIRECT in the last 72 hours. Thyroid Function Tests: No results for input(s): TSH, T4TOTAL, FREET4, T3FREE, THYROIDAB in the last 72 hours. Anemia Panel: No results for input(s): VITAMINB12, FOLATE, FERRITIN, TIBC, IRON, RETICCTPCT in the last 72 hours. Urine analysis:    Component Value Date/Time   COLORURINE AMBER (A)  03/02/2021 1905   APPEARANCEUR CLOUDY (A) 03/02/2021 1905   LABSPEC 1.012 03/02/2021 1905   PHURINE 5.0 03/02/2021 1905   GLUCOSEU NEGATIVE 03/02/2021 1905   HGBUR LARGE (A) 03/02/2021 1905   BILIRUBINUR NEGATIVE 03/02/2021 1905   KETONESUR NEGATIVE 03/02/2021 1905   PROTEINUR >=300 (A) 03/02/2021 1905   NITRITE NEGATIVE 03/02/2021 1905   LEUKOCYTESUR MODERATE (A) 03/02/2021 1905    Radiological Exams on Admission: DG Chest 2 View  Result Date: 03/16/2021 CLINICAL DATA:  30 year old male with shortness of breath. Lower extremity swelling. EXAM: CHEST - 2 VIEW COMPARISON:  CT Chest, Abdomen, and Pelvis 07/02/2012 and earlier. FINDINGS: Right side PICC line in place. Lower lung volumes compared to 2009. Cardiac size and mediastinal contours remain normal. Visualized tracheal air column is within normal limits. No pneumothorax, pleural effusion or consolidation. But mild to moderate diffuse increased pulmonary interstitial opacity with a basilar predominance compared to 2009. No acute osseous abnormality identified. Negative visible bowel gas pattern. IMPRESSION: 1. Lower lung volumes with diffusely increased increased pulmonary interstitial opacity. Favor pulmonary interstitial edema in light of lower extremity swelling. Otherwise consider viral/atypical respiratory infection. 2. No pleural effusion.  PICC line in place. Electronically Signed   By: Odessa Fleming M.D.   On: 03/16/2021 07:44     Assessment/Plan Principal Problem:   Acute diastolic CHF (congestive heart failure) (HCC) Active Problems:   Nonrheumatic mitral valve regurgitation   History of anemia due to chronic kidney disease   Nicotine dependence   Hyperkalemia   Acute glomerulonephritis syndrome   Bacterial endocarditis     Acute on chronic diastolic dysfunction CHF Patient presents for evaluation of worsening bilateral lower extremity swelling and has shortness of breath Last 2D echocardiogram from 04/22 shows an LVEF of 60  to 65% with moderate mitral regurgitation Chest x-ray shows pulmonary edema and BNP is elevated We will start patient on Lasix 80 mg IV every 12 Add low-dose beta-blocker We will consult cardiology    Acute glomerulonephritis Etiology appears to be postinfectious Patient has generalized swelling Serum creatinine appears to be at baseline Appreciate nephrology input Continue Lasix 80 mg IV every 12   Bacterial endocarditis Patient was recently diagnosed with enterococcal endocarditis Continue IV Rocephin and ampicillin per ID recommendation to complete a 6-week course of therapy Patient will complete his course of antibiotic therapy on 04/16/21    Migraine headaches Trial of Fioricet    Nicotine dependence  Smoking cessation has been discussed with patient in detail We will place patient on nicotine transdermal patch 14 mg daily    Hyperkalemia We will treat with dextrose, insulin and Lokelma  DVT prophylaxis: Lovenox Code Status: full code Family Communication: Greater than 50% of time was spent discussing patient's condition and plan of care with him and his mother at the bedside.  All questions and concerns have been addressed.  He verbalizes understanding and agrees with the plan. Disposition Plan: Back to previous home environment Consults called: Nephrology/cardiology Status: At the time of admission, it appears that the appropriate admission status for this patient is inpatient. This is judged to be reasonable and necessary in order to provide the required intensity of service to ensure the patient's safety given the presenting symptoms, physical exam findings, and initial radiographic and laboratory data in the context of their comorbid conditions. Patient requires inpatient status due to high intensity of service, high risk for further deterioration and high frequency of surveillance.    Lucile Shutters MD [facility]     03/16/2021, 11:16 AM

## 2021-03-17 ENCOUNTER — Inpatient Hospital Stay: Payer: Medicaid Other

## 2021-03-17 LAB — BASIC METABOLIC PANEL
Anion gap: 11 (ref 5–15)
BUN: 64 mg/dL — ABNORMAL HIGH (ref 6–20)
CO2: 25 mmol/L (ref 22–32)
Calcium: 8.4 mg/dL — ABNORMAL LOW (ref 8.9–10.3)
Chloride: 102 mmol/L (ref 98–111)
Creatinine, Ser: 3.32 mg/dL — ABNORMAL HIGH (ref 0.61–1.24)
GFR, Estimated: 25 mL/min — ABNORMAL LOW (ref >60)
Glucose, Bld: 95 mg/dL (ref 70–99)
Potassium: 5.3 mmol/L — ABNORMAL HIGH (ref 3.5–5.1)
Sodium: 138 mmol/L (ref 135–145)

## 2021-03-17 MED ORDER — CHLORHEXIDINE GLUCONATE CLOTH 2 % EX PADS
6.0000 | MEDICATED_PAD | Freq: Every day | CUTANEOUS | Status: DC
  Administered 2021-03-18 – 2021-03-19 (×2): 6 via TOPICAL

## 2021-03-17 MED ORDER — METOPROLOL TARTRATE 25 MG PO TABS
25.0000 mg | ORAL_TABLET | Freq: Once | ORAL | Status: AC
  Administered 2021-03-17: 25 mg via ORAL
  Filled 2021-03-17: qty 1

## 2021-03-17 MED ORDER — MORPHINE SULFATE (PF) 2 MG/ML IV SOLN
2.0000 mg | INTRAVENOUS | Status: DC | PRN
  Administered 2021-03-17 – 2021-03-19 (×9): 2 mg via INTRAVENOUS
  Filled 2021-03-17 (×9): qty 1

## 2021-03-17 MED ORDER — SODIUM CHLORIDE 0.9 % IV SOLN
2.0000 g | Freq: Four times a day (QID) | INTRAVENOUS | Status: DC
  Administered 2021-03-17 – 2021-03-19 (×9): 2 g via INTRAVENOUS
  Filled 2021-03-17: qty 2
  Filled 2021-03-17 (×4): qty 2000
  Filled 2021-03-17 (×2): qty 2
  Filled 2021-03-17 (×4): qty 2000
  Filled 2021-03-17: qty 2
  Filled 2021-03-17: qty 2000

## 2021-03-17 MED ORDER — SODIUM ZIRCONIUM CYCLOSILICATE 10 G PO PACK
10.0000 g | PACK | Freq: Every day | ORAL | Status: DC
  Administered 2021-03-17: 10 g via ORAL
  Filled 2021-03-17 (×2): qty 1

## 2021-03-17 MED ORDER — ENOXAPARIN SODIUM 40 MG/0.4ML ~~LOC~~ SOLN
40.0000 mg | SUBCUTANEOUS | Status: DC
  Administered 2021-03-17: 40 mg via SUBCUTANEOUS
  Filled 2021-03-17: qty 0.4

## 2021-03-17 MED ORDER — CARVEDILOL 6.25 MG PO TABS
6.2500 mg | ORAL_TABLET | Freq: Two times a day (BID) | ORAL | Status: DC
  Administered 2021-03-18 – 2021-03-19 (×4): 6.25 mg via ORAL
  Filled 2021-03-17 (×4): qty 1

## 2021-03-17 NOTE — Progress Notes (Signed)
[facility]  ROUNDING NOTE   Subjective:   Mr. SALLY REIMERS was admitted to [facility] on 03/16/2021 for Acute kidney failure, unspecified (HCC) [N17.9] Anasarca [R60.1] Generalized edema [R60.1] Scrotal pain [N50.82] Acute diastolic CHF (congestive heart failure) (HCC) [I50.31]  UOP 1075 - furosemide 80mg  IV q12.   Patient complains of headache. He states his peripheral edema has improved.   Denies any gross hematuria.    Objective:  Vital signs in last 24 hours:  Temp:  [97.5 F (36.4 C)-98.9 F (37.2 C)] 98.9 F (37.2 C) (04/17 0409) Pulse Rate:  [84-103] 95 (04/17 0409) Resp:  [16-25] 16 (04/17 0409) BP: (128-149)/(88-102) 134/94 (04/17 0409) SpO2:  [93 %-100 %] 93 % (04/17 0409) Weight:  [81.6 kg-83.9 kg] 81.6 kg (04/17 0900)  Weight change: 21.7 kg Filed Weights   03/16/21 0633 03/17/21 0409 03/17/21 0900  Weight: 62.1 kg 83.9 kg 81.6 kg    Intake/Output: I/O last 3 completed shifts: In: 340 [P.O.:240; IV Piggyback:100] Out: 1075 [Urine:1075]   Intake/Output this shift:  Total I/O In: 100 [IV Piggyback:100] Out: -   Physical Exam: General: NAD, sitting up in bed  Head: Normocephalic, atraumatic. Moist oral mucosal membranes  Eyes: Anicteric, PERRL  Neck: Supple, trachea midline  Lungs:  Crackles at bases bilaterally  Heart: Regular rate and rhythm  Abdomen:  Soft, nontender,   Extremities: + peripheral edema.  Neurologic: Nonfocal, moving all four extremities  Skin: No lesions  Access: none    Basic Metabolic Panel: Recent Labs  Lab 03/11/21 0553 03/16/21 0658 03/17/21 0550  NA 139 138 138  K 4.6 5.7* 5.3*  CL 106 107 102  CO2 25 23 25   GLUCOSE 89 97 95  BUN 52* 69* 64*  CREATININE 2.95* 3.26* 3.32*  CALCIUM 8.0* 8.9 8.4*  PHOS 5.5*  --   --     Liver Function Tests: Recent Labs  Lab 03/11/21 0553 03/16/21 0658  AST  --  27  ALT  --  23  ALKPHOS  --  33*  BILITOT  --  0.5  PROT  --  6.3*  ALBUMIN 2.4* 2.6*   No  results for input(s): LIPASE, AMYLASE in the last 168 hours. No results for input(s): AMMONIA in the last 168 hours.  CBC: Recent Labs  Lab 03/11/21 0553 03/16/21 0658  WBC 7.4 8.0  NEUTROABS  --  6.0  HGB 8.4* 8.6*  HCT 26.3* 27.9*  MCV 87.7 89.1  PLT 346 218    Cardiac Enzymes: No results for input(s): CKTOTAL, CKMB, CKMBINDEX, TROPONINI in the last 168 hours.  BNP: Invalid input(s): POCBNP  CBG: No results for input(s): GLUCAP in the last 168 hours.  Microbiology: Results for orders placed or performed during the hospital encounter of 03/16/21  Resp Panel by RT-PCR (Flu A&B, Covid) Nasopharyngeal Swab     Status: None   Collection Time: 03/16/21  7:02 AM   Specimen: Nasopharyngeal Swab; Nasopharyngeal(NP) swabs in vial transport medium  Result Value Ref Range Status   SARS Coronavirus 2 by RT PCR NEGATIVE NEGATIVE Final    Comment: (NOTE) SARS-CoV-2 target nucleic acids are NOT DETECTED.  The SARS-CoV-2 RNA is generally detectable in upper respiratory specimens during the acute phase of infection. The lowest concentration of SARS-CoV-2 viral copies this assay can detect is 138 copies/mL. A negative result does not preclude SARS-Cov-2 infection and should not be used as the sole basis for treatment or other patient management decisions. A negative result may occur with  improper specimen collection/handling, submission of specimen other than nasopharyngeal swab, presence of viral mutation(s) within the areas targeted by this assay, and inadequate number of viral copies(<138 copies/mL). A negative result must be combined with clinical observations, patient history, and epidemiological information. The expected result is Negative.  Fact Sheet for Patients:  BloggerCourse.comhttps://www.fda.gov/media/152166/download  Fact Sheet for Healthcare Providers:  SeriousBroker.ithttps://www.fda.gov/media/152162/download  This test is no t yet approved or cleared by the [place] FDA and  has been  authorized for detection and/or diagnosis of SARS-CoV-2 by FDA under an Emergency Use Authorization (EUA). This EUA will remain  in effect (meaning this test can be used) for the duration of the COVID-19 declaration under Section 564(b)(1) of the Act, 21 U.S.C.section 360bbb-3(b)(1), unless the authorization is terminated  or revoked sooner.       Influenza A by PCR NEGATIVE NEGATIVE Final   Influenza B by PCR NEGATIVE NEGATIVE Final    Comment: (NOTE) The Xpert Xpress SARS-CoV-2/FLU/RSV plus assay is intended as an aid in the diagnosis of influenza from Nasopharyngeal swab specimens and should not be used as a sole basis for treatment. Nasal washings and aspirates are unacceptable for Xpert Xpress SARS-CoV-2/FLU/RSV testing.  Fact Sheet for Patients: BloggerCourse.comhttps://www.fda.gov/media/152166/download  Fact Sheet for Healthcare Providers: SeriousBroker.ithttps://www.fda.gov/media/152162/download  This test is not yet approved or cleared by the [place] FDA and has been authorized for detection and/or diagnosis of SARS-CoV-2 by FDA under an Emergency Use Authorization (EUA). This EUA will remain in effect (meaning this test can be used) for the duration of the COVID-19 declaration under Section 564(b)(1) of the Act, 21 U.S.C. section 360bbb-3(b)(1), unless the authorization is terminated or revoked.  Performed at [facility], [address] [place], [place] [address]   Culture, blood (routine x 2)     Status: None (Preliminary result)   Collection Time: 03/16/21  2:07 PM   Specimen: BLOOD  Result Value Ref Range Status   Specimen Description BLOOD BLOOD LEFT ARM  Final   Special Requests   Final    BOTTLES DRAWN AEROBIC AND ANAEROBIC Blood Culture adequate volume   Culture   Final    NO GROWTH < 24 HOURS Performed at [facility], [address] [place], [place] [address]    Report Status PENDING  Incomplete  Culture, blood (routine x 2)     Status: None (Preliminary  result)   Collection Time: 03/16/21  2:07 PM   Specimen: BLOOD  Result Value Ref Range Status   Specimen Description BLOOD BLOOD LEFT HAND  Final   Special Requests   Final    BOTTLES DRAWN AEROBIC AND ANAEROBIC Blood Culture adequate volume   Culture   Final    NO GROWTH < 24 HOURS Performed at [facility], [address] [place], [place] [address]    Report Status PENDING  Incomplete    Coagulation Studies: No results for input(s): LABPROT, INR in the last 72 hours.  Urinalysis: Recent Labs    03/16/21 1056  COLORURINE YELLOW*  LABSPEC 1.013  PHURINE 5.0  GLUCOSEU NEGATIVE  HGBUR LARGE*  BILIRUBINUR NEGATIVE  KETONESUR NEGATIVE  PROTEINUR >=300*  NITRITE NEGATIVE  LEUKOCYTESUR SMALL*      Imaging: DG Chest 2 View  Result Date: 03/16/2021 CLINICAL DATA:  30 year old male with shortness of breath. Lower extremity swelling. EXAM: CHEST - 2 VIEW COMPARISON:  CT Chest, Abdomen, and Pelvis 07/02/2012 and earlier. FINDINGS: Right side PICC line in place. Lower lung volumes compared to 2009. Cardiac  size and mediastinal contours remain normal. Visualized tracheal air column is within normal limits. No pneumothorax, pleural effusion or consolidation. But mild to moderate diffuse increased pulmonary interstitial opacity with a basilar predominance compared to 2009. No acute osseous abnormality identified. Negative visible bowel gas pattern. IMPRESSION: 1. Lower lung volumes with diffusely increased increased pulmonary interstitial opacity. Favor pulmonary interstitial edema in light of lower extremity swelling. Otherwise consider viral/atypical respiratory infection. 2. No pleural effusion.  PICC line in place. Electronically Signed   By: Odessa Fleming M.D.   On: 03/16/2021 07:44   US Venous Img Lower Bilateral (DVT)  Result Date: 03/16/2021 CLINICAL DATA:  Generalized edema for 2 weeks. EXAM: BILATERAL LOWER EXTREMITY VENOUS DOPPLER ULTRASOUND TECHNIQUE: Gray-scale sonography  with compression, as well as color and duplex ultrasound, were performed to evaluate the deep venous system(s) from the level of the common femoral vein through the popliteal and proximal calf veins. COMPARISON:  None. FINDINGS: VENOUS Normal compressibility of the common femoral, superficial femoral, and popliteal veins, as well as the visualized calf veins. Visualized portions of profunda femoral vein and great saphenous vein unremarkable. No filling defects to suggest DVT on grayscale or color Doppler imaging. Doppler waveforms show normal direction of venous flow, normal respiratory plasticity and response to augmentation. OTHER None. Limitations: none IMPRESSION: Negative for DVT in the bilateral lower extremities. Electronically Signed   By: Emmaline Kluver M.D.   On: 03/16/2021 12:35   US Venous Img Upper Uni Right(DVT)  Result Date: 03/17/2021 CLINICAL DATA:  Pain and swelling for 1 day EXAM: RIGHT UPPER EXTREMITY VENOUS DOPPLER ULTRASOUND TECHNIQUE: Gray-scale sonography with graded compression, as well as color Doppler and duplex ultrasound were performed to evaluate the upper extremity deep venous system from the level of the subclavian vein and including the jugular, axillary, basilic, radial, ulnar and upper cephalic vein. Spectral Doppler was utilized to evaluate flow at rest and with distal augmentation maneuvers. COMPARISON:  None. FINDINGS: Contralateral Subclavian Vein: Respiratory phasicity is normal and symmetric with the symptomatic side. No evidence of thrombus. Normal compressibility. Internal Jugular Vein: No evidence of thrombus. Normal compressibility, respiratory phasicity and response to augmentation. Subclavian Vein: No evidence of thrombus. Normal compressibility, respiratory phasicity and response to augmentation. Axillary Vein: No evidence of thrombus. Normal compressibility, respiratory phasicity and response to augmentation. Cephalic Vein: Partially occlusive thrombus noted in  the distal upper arm. Basilic Vein: No evidence of thrombus. Normal compressibility, respiratory phasicity and response to augmentation. Brachial Veins: Small amount of nonocclusive thrombus noted in the right brachial vein around the PICC. Radial Veins: No evidence of thrombus. Normal compressibility, respiratory phasicity and response to augmentation. Ulnar Veins: No evidence of thrombus. Normal compressibility, respiratory phasicity and response to augmentation. Venous Reflux:  None visualized. Other Findings:  None visualized. IMPRESSION: 1. Small amount of nonocclusive thrombus noted in the distal upper arm cephalic vein. 2. Small amount of nonocclusive thrombus noted adjacent to PICC in the brachial vein in the mid upper arm. Electronically Signed   By: Acquanetta Belling M.D.   On: 03/17/2021 08:51   US SCROTUM W/DOPPLER  Result Date: 03/16/2021 CLINICAL DATA:  Edema, pain for 2 weeks. EXAM: SCROTAL ULTRASOUND DOPPLER ULTRASOUND OF THE TESTICLES TECHNIQUE: Complete ultrasound examination of the testicles, epididymis, and other scrotal structures was performed. Color and spectral Doppler ultrasound were also utilized to evaluate blood flow to the testicles. COMPARISON:  None. FINDINGS: Right testicle Measurements: 5.1 x 3.0 x 2.8 cm. No mass or microlithiasis visualized. Left testicle  Measurements: 4.8 x 3.1 x 3.2 cm. No mass or microlithiasis visualized. Right epididymis:  Normal in size and appearance. Left epididymis:  Normal in size and appearance. Hydrocele:  None visualized. Varicocele:  Unable to evaluate due to extensive scrotal edema. Pulsed Doppler interrogation of both testes demonstrates normal low resistance arterial and venous waveforms bilaterally. Marked bilateral scrotal edema. IMPRESSION: Marked scrotal edema, otherwise no acute finding sonographically. Electronically Signed   By: Emmaline Kluver M.D.   On: 03/16/2021 12:38     Medications:   . sodium chloride    . albumin human 25 g  (03/16/21 1729)  . ampicillin (OMNIPEN) IV 2 g (03/17/21 1018)  . cefTRIAXone (ROCEPHIN)  IV Stopped (03/16/21 2338)   . enoxaparin (LOVENOX) injection  40 mg Subcutaneous Q24H  . furosemide  80 mg Intravenous Q12H  . melatonin  2.5 mg Oral QHS  . metoprolol succinate  25 mg Oral Daily  . nicotine  14 mg Transdermal Daily  . sodium chloride flush  3 mL Intravenous Q12H  . sodium zirconium cyclosilicate  10 g Oral Daily   sodium chloride, acetaminophen, butalbital-acetaminophen-caffeine, morphine injection, ondansetron (ZOFRAN) IV, sodium chloride flush, traZODone  Assessment/ Plan:  Mr. KAISER BELLUOMINI is a 30 y.o. white male with no past medical history with post infectious glomerulonephritis biopsy proven admitted to [facility] on 03/16/2021 for Acute kidney failure, unspecified (HCC) [N17.9] Anasarca [R60.1] Generalized edema [R60.1] Scrotal pain [N50.82] Acute diastolic CHF (congestive heart failure) (HCC) [I50.31]  Father was on dialysis. Patient is a tobacco and THC smoker. Had TCA in urine tox screen.   1. 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 Anasarca 3. Nephrotic range proteinuria: 5.44g 4. Hematuria 5. Hypertension  Patient has failed outpatient diuretics.  - Continue IV furosemide - Continue IV antibiotics.  - Pending repeat serum complements.  - Continue metoprolol    LOS: 1 Lebaron Bautch 4/17/202210:46 AM

## 2021-03-17 NOTE — Progress Notes (Addendum)
Progress Note  Patient Name: Jerome Mccormick Date of Encounter: 03/17/2021  Primary Cardiologist: Debbe Odea, MD   Subjective   No CP or SOB. Feels his swelling of his feet are improving.  Inpatient Medications    Scheduled Meds: . enoxaparin (LOVENOX) injection  40 mg Subcutaneous Q24H  . furosemide  80 mg Intravenous Q12H  . melatonin  2.5 mg Oral QHS  . metoprolol succinate  25 mg Oral Daily  . nicotine  14 mg Transdermal Daily  . sodium chloride flush  3 mL Intravenous Q12H  . sodium zirconium cyclosilicate  10 g Oral Daily   Continuous Infusions: . sodium chloride    . albumin human 25 g (03/16/21 1729)  . ampicillin (OMNIPEN) IV 2 g (03/17/21 1018)  . cefTRIAXone (ROCEPHIN)  IV Stopped (03/16/21 2338)   PRN Meds: sodium chloride, acetaminophen, butalbital-acetaminophen-caffeine, morphine injection, ondansetron (ZOFRAN) IV, sodium chloride flush, traZODone   Vital Signs    Vitals:   03/16/21 1741 03/16/21 2014 03/17/21 0409 03/17/21 0900  BP:  (!) 149/101 (!) 134/94   Pulse: 91 84 95   Resp: 17 16 16    Temp:  (!) 97.5 F (36.4 C) 98.9 F (37.2 C)   TempSrc:  Oral Oral   SpO2: 100% 100% 93%   Weight:   83.9 kg 81.6 kg  Height:   5\' 7"  (1.702 m)     Intake/Output Summary (Last 24 hours) at 03/17/2021 1040 Last data filed at 03/17/2021 0932 Gross per 24 hour  Intake 440 ml  Output 1075 ml  Net -635 ml   Last 3 Weights 03/17/2021 03/17/2021 03/16/2021  Weight (lbs) 179 lb 12.8 oz 184 lb 14.4 oz 137 lb  Weight (kg) 81.557 kg 83.87 kg 62.143 kg      Telemetry    NSR-ST with rates 80-100s - Personally Reviewed  ECG    No new tracings -  Personally Reviewed  Physical Exam   GEN: No acute distress.  Seated at edge of bed. Neck: No JVD Cardiac: RRR, 3/6 holosystolic murmur/high pitched MR murmur, rubs, or gallops.  Respiratory: trace bibasilar crackles otherwise CTAB. GI: Soft, nontender, non-distended   MS: improved bilateral moderate LEE /  improving pedal edema; No deformity. Neuro:  Nonfocal  Psych: Normal affect   Labs    High Sensitivity Troponin:   Recent Labs  Lab 03/16/21 0658  TROPONINIHS 8      Chemistry Recent Labs  Lab 03/11/21 0553 03/16/21 0658 03/17/21 0550  NA 139 138 138  K 4.6 5.7* 5.3*  CL 106 107 102  CO2 25 23 25   GLUCOSE 89 97 95  BUN 52* 69* 64*  CREATININE 2.95* 3.26* 3.32*  CALCIUM 8.0* 8.9 8.4*  PROT  --  6.3*  --   ALBUMIN 2.4* 2.6*  --   AST  --  27  --   ALT  --  23  --   ALKPHOS  --  33*  --   BILITOT  --  0.5  --   GFRNONAA 28* 25* 25*  ANIONGAP 8 8 11      Hematology Recent Labs  Lab 03/11/21 0553 03/16/21 0658  WBC 7.4 8.0  RBC 3.00* 3.13*  HGB 8.4* 8.6*  HCT 26.3* 27.9*  MCV 87.7 89.1  MCH 28.0 27.5  MCHC 31.9 30.8  RDW 13.6 14.9  PLT 346 218    BNP Recent Labs  Lab 03/16/21 0658  BNP 570.3*     DDimer No results for input(s): DDIMER  in the last 168 hours.   Radiology    DG Chest 2 View  Result Date: 03/16/2021 CLINICAL DATA:  30 year old male with shortness of breath. Lower extremity swelling. EXAM: CHEST - 2 VIEW COMPARISON:  CT Chest, Abdomen, and Pelvis 07/02/2012 and earlier. FINDINGS: Right side PICC line in place. Lower lung volumes compared to 2009. Cardiac size and mediastinal contours remain normal. Visualized tracheal air column is within normal limits. No pneumothorax, pleural effusion or consolidation. But mild to moderate diffuse increased pulmonary interstitial opacity with a basilar predominance compared to 2009. No acute osseous abnormality identified. Negative visible bowel gas pattern. IMPRESSION: 1. Lower lung volumes with diffusely increased increased pulmonary interstitial opacity. Favor pulmonary interstitial edema in light of lower extremity swelling. Otherwise consider viral/atypical respiratory infection. 2. No pleural effusion.  PICC line in place. Electronically Signed   By: Odessa Fleming M.D.   On: 03/16/2021 07:44   US Venous Img  Lower Bilateral (DVT)  Result Date: 03/16/2021 CLINICAL DATA:  Generalized edema for 2 weeks. EXAM: BILATERAL LOWER EXTREMITY VENOUS DOPPLER ULTRASOUND TECHNIQUE: Gray-scale sonography with compression, as well as color and duplex ultrasound, were performed to evaluate the deep venous system(s) from the level of the common femoral vein through the popliteal and proximal calf veins. COMPARISON:  None. FINDINGS: VENOUS Normal compressibility of the common femoral, superficial femoral, and popliteal veins, as well as the visualized calf veins. Visualized portions of profunda femoral vein and great saphenous vein unremarkable. No filling defects to suggest DVT on grayscale or color Doppler imaging. Doppler waveforms show normal direction of venous flow, normal respiratory plasticity and response to augmentation. OTHER None. Limitations: none IMPRESSION: Negative for DVT in the bilateral lower extremities. Electronically Signed   By: Emmaline Kluver M.D.   On: 03/16/2021 12:35   US Venous Img Upper Uni Right(DVT)  Result Date: 03/17/2021 CLINICAL DATA:  Pain and swelling for 1 day EXAM: RIGHT UPPER EXTREMITY VENOUS DOPPLER ULTRASOUND TECHNIQUE: Gray-scale sonography with graded compression, as well as color Doppler and duplex ultrasound were performed to evaluate the upper extremity deep venous system from the level of the subclavian vein and including the jugular, axillary, basilic, radial, ulnar and upper cephalic vein. Spectral Doppler was utilized to evaluate flow at rest and with distal augmentation maneuvers. COMPARISON:  None. FINDINGS: Contralateral Subclavian Vein: Respiratory phasicity is normal and symmetric with the symptomatic side. No evidence of thrombus. Normal compressibility. Internal Jugular Vein: No evidence of thrombus. Normal compressibility, respiratory phasicity and response to augmentation. Subclavian Vein: No evidence of thrombus. Normal compressibility, respiratory phasicity and  response to augmentation. Axillary Vein: No evidence of thrombus. Normal compressibility, respiratory phasicity and response to augmentation. Cephalic Vein: Partially occlusive thrombus noted in the distal upper arm. Basilic Vein: No evidence of thrombus. Normal compressibility, respiratory phasicity and response to augmentation. Brachial Veins: Small amount of nonocclusive thrombus noted in the right brachial vein around the PICC. Radial Veins: No evidence of thrombus. Normal compressibility, respiratory phasicity and response to augmentation. Ulnar Veins: No evidence of thrombus. Normal compressibility, respiratory phasicity and response to augmentation. Venous Reflux:  None visualized. Other Findings:  None visualized. IMPRESSION: 1. Small amount of nonocclusive thrombus noted in the distal upper arm cephalic vein. 2. Small amount of nonocclusive thrombus noted adjacent to PICC in the brachial vein in the mid upper arm. Electronically Signed   By: Acquanetta Belling M.D.   On: 03/17/2021 08:51   US SCROTUM W/DOPPLER  Result Date: 03/16/2021 CLINICAL DATA:  Edema, pain  for 2 weeks. EXAM: SCROTAL ULTRASOUND DOPPLER ULTRASOUND OF THE TESTICLES TECHNIQUE: Complete ultrasound examination of the testicles, epididymis, and other scrotal structures was performed. Color and spectral Doppler ultrasound were also utilized to evaluate blood flow to the testicles. COMPARISON:  None. FINDINGS: Right testicle Measurements: 5.1 x 3.0 x 2.8 cm. No mass or microlithiasis visualized. Left testicle Measurements: 4.8 x 3.1 x 3.2 cm. No mass or microlithiasis visualized. Right epididymis:  Normal in size and appearance. Left epididymis:  Normal in size and appearance. Hydrocele:  None visualized. Varicocele:  Unable to evaluate due to extensive scrotal edema. Pulsed Doppler interrogation of both testes demonstrates normal low resistance arterial and venous waveforms bilaterally. Marked bilateral scrotal edema. IMPRESSION: Marked scrotal  edema, otherwise no acute finding sonographically. Electronically Signed   By: Emmaline KluverNancy  Ballantyne M.D.   On: 03/16/2021 12:38    Cardiac Studies   Echo -03/16/21 Pending  Transesophageal echo 4.6.2022  1. Left ventricular ejection fraction, by estimation, is 60 to 65%. The  left ventricle has normal function. The left ventricle has no regional  wall motion abnormalities.  2. Right ventricular systolic function is normal. The right ventricular  size is normal.  3. No left atrial/left atrial appendage thrombus was detected.  4. 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  The mitral valve is abnormal. Mild to moderate mitral valve regurgitation.  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  5. The aortic valve is tricuspid. Aortic valve regurgitation is not  visualized.  __________  2D echo 4.3.2022 1. Left ventricular ejection fraction, by estimation, is 60 to 65%. The  left ventricle has normal function. The left ventricle has no regional  wall motion abnormalities. Left ventricular diastolic parameters were  normal.  2. Right ventricular systolic function is normal. The right ventricular  size is normal. There is mildly elevated pulmonary artery systolic  pressure. The estimated right ventricular systolic pressure is 38.3 mmHg.  3. Left atrial size was moderately dilated.  4. The mitral valve is abnormal.Posterior leaflet with prolapse, unable  to exclude partially flail leaflet or torn chordea (images 30-32) Moderate  eccentric mitral valve regurgitation. No evidence of mitral stenosis.  5. The inferior vena cava is dilated in size with >50% respiratory    Patient Profile     30 y.o. male with history of mitral valve prolapse /moderate MR with ruptured chordae, tobacco use, previous alcohol abuse, migraines, who is being seen today for the evaluation of acute on chronic diastolic heart failure with MR and s/p  recent admission with discharge home on IV abx tx of bacteremia with ARF 2/2 enterococcus.   Assessment & Plan    AOC HFpEF, R>L --Improving bilateral LEE / anasarca / volume status.  --Continue IV lasix 80mg  q12h.  --Daily I/Os. Net -735cc yesterday. --Daily Wt 83.9kg  81.6kg. --Daily BMET. Cr 3.26  3.32. BUN 69  64. --Plan is for at least 1 more day of diuresis. --Still pending updated echo as above to reassess EF and valve. --Worsening valvular function, [facility] --Continue Toprol 25mg  daily. --Will need transition to oral diuretic before discharge.  Mitral regurgitation --Significant murmur on exam.  --Moderate by previous echo. --Echo still pending to reassess valve. --Remainder as above.  Nephrotic range proteinuria --Nephrology consulted. Consider as contributing to his edema.  Enterococcal bacteremia --No growth <24h on repeat blood cultures.    For questions or updates, please contact  [facility] Please consult [email or web address] for contact info under   Signed, Lennon Alstrom, PA-C  03/17/2021, 10:40 AM      Cardiology Attending  Patient seen and examined. Agree with above. The patient feels better today. His renal function is stable and he has had a nice diuresis. I await the echo. I would expect he could be discharged home tomorrow and has followup with Dr. Cornelius Moras on Wednesday in [place]. He has no evidence of active infection. I suspect he will lose kidneys with MV repair/replacement.   Sharlot Gowda Cowen Pesqueira,MD

## 2021-03-17 NOTE — Progress Notes (Signed)
PROGRESS NOTE    Jerome Mccormick  QVZ:[id number] DOB: [birth year] DOA: 03/16/2021 PCP: Patient, No Pcp Per (Inactive)   Brief Narrative: Taken from H&P. Jerome Mccormick is a 30 y.o. male with medical history significant for recent diagnosis of enterococcal endocarditis discharged home on IV antibiotics (Rocephin 2 g IV every 12 and ampicillin 8 g IV daily for 6 weeks, last dose 04/16/21), history of migraine headaches as well as postinfectious glomerulonephritis who was discharged from the hospital 5 days prior to his presentation today with complaints of worsening lower extremity swelling. Patient states that he notices worsening swelling since his discharge especially after he started getting his IV antibiotics at home.  He has been compliant with his torsemide as well as his diuretics. In addition to lower extremity swelling he has shortness of breath with exertion but denies having any orthopnea or paroxysmal nocturnal dyspnea.  He complains of a headache and states that he has a history of migraine headaches. He denies having any chest pain, no nausea, no vomiting, no diaphoresis, no palpitations, no urinary frequency, no nocturia, no dysuria, no fever, no chills, no cough, no dizziness, no lightheadedness, no abdominal pain, no changes in his bowel habits, no blurred vision or focal deficits. Labs show sodium 138, potassium 5.7, chloride 107, bicarb 23, glucose 97, BUN 69, creatinine 3.26 above 2.95 on his discharge, calcium 8.9, alkaline phosphatase 33, albumin 2.6, AST 27, ALT 23, total protein 6.3, BNP 570, white count 8.0, hemoglobin 8.6, hematocrit 27.9, MCV 89.1, RDW 14.9, platelet count 218 Respiratory viral panel is negative Chest x-ray  shows lower lung volumes with diffusely increased increased pulmonary interstitial opacity. Favor pulmonary interstitial edema in light of lower extremity swelling. Otherwise consider viral/atypical respiratory infection. No pleural effusion. PICC line in  place. Echocardiogram with mitral prolapse and ruptured posterior chordae-cardiology is on board and might need transfer to [facility] if immediate surgical intervention needed. They are planning to diuresed for another 1 to 2 days and if remains stable will follow up with vascular surgery according to his schedule appointment on upcoming Wednesday for elective procedure.  Repeat blood cultures negative so far.  Subjective: Patient was complaining of generalized malaise and some nausea, no vomiting.  Some improvement in his scrotal and lower extremity edema.  Assessment & Plan:   Principal Problem:   Acute diastolic CHF (congestive heart failure) (HCC) Active Problems:   Nonrheumatic mitral valve regurgitation   History of anemia due to chronic kidney disease   Nicotine dependence   Hyperkalemia   Acute glomerulonephritis syndrome   Bacterial endocarditis  Acute diastolic heart failure.  Recent echocardiogram with normal EF, moderate mitral regurgitation with concern of posterior chordae rupture. Breathing improving with IV diuresis.  Cardiology was consulted. -Continue with IV Lasix. -Continue with beta-blocker. -Continue with daily BMP and weight -Strict intake and output.  Enterococcal endocarditis.  Patient is on ampicillin and ceftriaxone till 04/16/2021. PICC line in place.  Repeat blood cultures today was negative so far. -Continue ceftriaxone and ampicillin. -Might need transfer to [facility] for urgent valvular surgery if deteriorates, otherwise he will get IV diuresis for another 1 to 2 days and follow-up with thoracic surgery according to his appointment on Wednesday for elective procedure.  AKI/acute postinfectious glomerulonephritis.  Creatinine elevated since earlier April 2022, currently stable.  Patient had biopsy proven postinfectious glomerulonephritis which was diagnosed during his recent stay with enterococcal bacteremia. Nephrology is on board-appreciate their  recommendations -Continue with IV diuresis -Monitor renal function -  Avoid nephrotoxins  Hyperkalemia.  Potassium with some improvement after getting 1 dose of Lokelma yesterday, still little elevated at 5.3. -Continue with Lokelma -Monitor potassium levels  Migraine headaches. -Continue with Fioricet.  Nicotine dependence. -Counseling was provided. -Continue with nicotine patch  Objective:  Vitals:   03/16/21 1741 03/16/21 2014 03/17/21 0409 03/17/21 0900  BP:  (!) 149/101 (!) 134/94   Pulse: 91 84 95   Resp: 17 16 16    Temp:  (!) 97.5 F (36.4 C) 98.9 F (37.2 C)   TempSrc:  Oral Oral   SpO2: 100% 100% 93%   Weight:   83.9 kg 81.6 kg  Height:   5\' 7"  (1.702 m)     Intake/Output Summary (Last 24 hours) at 03/17/2021 1115 Last data filed at 03/17/2021 0932 Gross per 24 hour  Intake 440 ml  Output 1075 ml  Net -635 ml   Filed Weights   03/16/21 0633 03/17/21 0409 03/17/21 0900  Weight: 62.1 kg 83.9 kg 81.6 kg    Examination:  General exam: Appears calm and comfortable  Respiratory system: Clear to auscultation. Respiratory effort normal. Cardiovascular system: S1 & S2 heard, RRR.  Loud murmur Gastrointestinal system: Soft, nontender, nondistended, bowel sounds positive. Central nervous system: Alert and oriented. No focal neurological deficits. Extremities: 2+ LE edema, no cyanosis, pulses intact and symmetrical. Skin: No rashes, lesions or ulcers Psychiatry: Judgement and insight appear normal. Mood & affect appropriate.    DVT prophylaxis: Lovenox Code Status: Full Family Communication: Discussed with patient Disposition Plan:  Status is: Inpatient  Remains inpatient appropriate because:Inpatient level of care appropriate due to severity of illness   Dispo: The patient is from: Home              Anticipated d/c is to: Home              Patient currently is not medically stable to d/c.   Difficult to place patient No               Level of care:  Progressive Cardiac  All the records are reviewed and case discussed with Care Management/Social Worker. Management plans discussed with the patient, nursing and they are in agreement.  Consultants:   Cardiology  Procedures:  Antimicrobials:   Data Reviewed: I have personally reviewed following labs and imaging studies  CBC: Recent Labs  Lab 03/11/21 0553 03/16/21 0658  WBC 7.4 8.0  NEUTROABS  --  6.0  HGB 8.4* 8.6*  HCT 26.3* 27.9*  MCV 87.7 89.1  PLT 346 218   Basic Metabolic Panel: Recent Labs  Lab 03/11/21 0553 03/16/21 0658 03/17/21 0550  NA 139 138 138  K 4.6 5.7* 5.3*  CL 106 107 102  CO2 25 23 25   GLUCOSE 89 97 95  BUN 52* 69* 64*  CREATININE 2.95* 3.26* 3.32*  CALCIUM 8.0* 8.9 8.4*  PHOS 5.5*  --   --    GFR: Estimated Creatinine Clearance: 33.3 mL/min (A) (by C-G formula based on SCr of 3.32 mg/dL (H)). Liver Function Tests: Recent Labs  Lab 03/11/21 0553 03/16/21 0658  AST  --  27  ALT  --  23  ALKPHOS  --  33*  BILITOT  --  0.5  PROT  --  6.3*  ALBUMIN 2.4* 2.6*   No results for input(s): LIPASE, AMYLASE in the last 168 hours. No results for input(s): AMMONIA in the last 168 hours. Coagulation Profile: Recent Labs  Lab 03/11/21 0553  INR 1.3*  Cardiac Enzymes: No results for input(s): CKTOTAL, CKMB, CKMBINDEX, TROPONINI in the last 168 hours. BNP (last 3 results) No results for input(s): PROBNP in the last 8760 hours. HbA1C: No results for input(s): HGBA1C in the last 72 hours. CBG: No results for input(s): GLUCAP in the last 168 hours. Lipid Profile: No results for input(s): CHOL, HDL, LDLCALC, TRIG, CHOLHDL, LDLDIRECT in the last 72 hours. Thyroid Function Tests: No results for input(s): TSH, T4TOTAL, FREET4, T3FREE, THYROIDAB in the last 72 hours. Anemia Panel: No results for input(s): VITAMINB12, FOLATE, FERRITIN, TIBC, IRON, RETICCTPCT in the last 72 hours. Sepsis Labs: No results for input(s): PROCALCITON, LATICACIDVEN  in the last 168 hours.  Recent Results (from the past 240 hour(s))  CULTURE, BLOOD (ROUTINE X 2) w Reflex to ID Panel     Status: None   Collection Time: 03/09/21  9:44 AM   Specimen: BLOOD  Result Value Ref Range Status   Specimen Description BLOOD LEFT ANTECUBITAL  Final   Special Requests   Final    BOTTLES DRAWN AEROBIC AND ANAEROBIC Blood Culture results may not be optimal due to an excessive volume of blood received in culture bottles   Culture   Final    NO GROWTH 5 DAYS Performed at [facility] [address] [place], [place] [address]    Report Status 03/14/2021 FINAL  Final  CULTURE, BLOOD (ROUTINE X 2) w Reflex to ID Panel     Status: None   Collection Time: 03/09/21  9:46 AM   Specimen: BLOOD  Result Value Ref Range Status   Specimen Description BLOOD RIGHT ARM  Final   Special Requests   Final    BOTTLES DRAWN AEROBIC AND ANAEROBIC Blood Culture adequate volume   Culture   Final    NO GROWTH 5 DAYS Performed at [facility], [address] [place], [place] [address]    Report Status 03/14/2021 FINAL  Final  Resp Panel by RT-PCR (Flu A&B, Covid) Nasopharyngeal Swab     Status: None   Collection Time: 03/16/21  7:02 AM   Specimen: Nasopharyngeal Swab; Nasopharyngeal(NP) swabs in vial transport medium  Result Value Ref Range Status   SARS Coronavirus 2 by RT PCR NEGATIVE NEGATIVE Final    Comment: (NOTE) SARS-CoV-2 target nucleic acids are NOT DETECTED.  The SARS-CoV-2 RNA is generally detectable in upper respiratory specimens during the acute phase of infection. The lowest concentration of SARS-CoV-2 viral copies this assay can detect is 138 copies/mL. A negative result does not preclude SARS-Cov-2 infection and should not be used as the sole basis for treatment or other patient management decisions. A negative result may occur with  improper specimen collection/handling, submission of specimen other than nasopharyngeal swab, presence of  viral mutation(s) within the areas targeted by this assay, and inadequate number of viral copies(<138 copies/mL). A negative result must be combined with clinical observations, patient history, and epidemiological information. The expected result is Negative.  Fact Sheet for Patients:  BloggerCourse.com  Fact Sheet for Healthcare Providers:  SeriousBroker.it  This test is no t yet approved or cleared by the [place] FDA and  has been authorized for detection and/or diagnosis of SARS-CoV-2 by FDA under an Emergency Use Authorization (EUA). This EUA will remain  in effect (meaning this test can be used) for the duration of the COVID-19 declaration under Section 564(b)(1) of the Act, 21 U.S.C.section 360bbb-3(b)(1), unless the authorization is terminated  or revoked sooner.       Influenza  A by PCR NEGATIVE NEGATIVE Final   Influenza B by PCR NEGATIVE NEGATIVE Final    Comment: (NOTE) The Xpert Xpress SARS-CoV-2/FLU/RSV plus assay is intended as an aid in the diagnosis of influenza from Nasopharyngeal swab specimens and should not be used as a sole basis for treatment. Nasal washings and aspirates are unacceptable for Xpert Xpress SARS-CoV-2/FLU/RSV testing.  Fact Sheet for Patients: BloggerCourse.com  Fact Sheet for Healthcare Providers: SeriousBroker.it  This test is not yet approved or cleared by the [place] FDA and has been authorized for detection and/or diagnosis of SARS-CoV-2 by FDA under an Emergency Use Authorization (EUA). This EUA will remain in effect (meaning this test can be used) for the duration of the COVID-19 declaration under Section 564(b)(1) of the Act, 21 U.S.C. section 360bbb-3(b)(1), unless the authorization is terminated or revoked.  Performed at [facility], [address] [place], [place] [address]   Culture, blood (routine x  2)     Status: None (Preliminary result)   Collection Time: 03/16/21  2:07 PM   Specimen: BLOOD  Result Value Ref Range Status   Specimen Description BLOOD BLOOD LEFT ARM  Final   Special Requests   Final    BOTTLES DRAWN AEROBIC AND ANAEROBIC Blood Culture adequate volume   Culture   Final    NO GROWTH < 24 HOURS Performed at [facility], [address] [place], [place] [address]    Report Status PENDING  Incomplete  Culture, blood (routine x 2)     Status: None (Preliminary result)   Collection Time: 03/16/21  2:07 PM   Specimen: BLOOD  Result Value Ref Range Status   Specimen Description BLOOD BLOOD LEFT HAND  Final   Special Requests   Final    BOTTLES DRAWN AEROBIC AND ANAEROBIC Blood Culture adequate volume   Culture   Final    NO GROWTH < 24 HOURS Performed at [facility], [address], [place], [place] [address]    Report Status PENDING  Incomplete     Radiology Studies: DG Chest 2 View  Result Date: 03/16/2021 CLINICAL DATA:  30 year old male with shortness of breath. Lower extremity swelling. EXAM: CHEST - 2 VIEW COMPARISON:  CT Chest, Abdomen, and Pelvis 07/02/2012 and earlier. FINDINGS: Right side PICC line in place. Lower lung volumes compared to 2009. Cardiac size and mediastinal contours remain normal. Visualized tracheal air column is within normal limits. No pneumothorax, pleural effusion or consolidation. But mild to moderate diffuse increased pulmonary interstitial opacity with a basilar predominance compared to 2009. No acute osseous abnormality identified. Negative visible bowel gas pattern. IMPRESSION: 1. Lower lung volumes with diffusely increased increased pulmonary interstitial opacity. Favor pulmonary interstitial edema in light of lower extremity swelling. Otherwise consider viral/atypical respiratory infection. 2. No pleural effusion.  PICC line in place. Electronically Signed   By: Odessa Fleming M.D.   On: 03/16/2021 07:44   US Venous Img  Lower Bilateral (DVT)  Result Date: 03/16/2021 CLINICAL DATA:  Generalized edema for 2 weeks. EXAM: BILATERAL LOWER EXTREMITY VENOUS DOPPLER ULTRASOUND TECHNIQUE: Gray-scale sonography with compression, as well as color and duplex ultrasound, were performed to evaluate the deep venous system(s) from the level of the common femoral vein through the popliteal and proximal calf veins. COMPARISON:  None. FINDINGS: VENOUS Normal compressibility of the common femoral, superficial femoral, and popliteal veins, as well as the visualized calf veins. Visualized portions of profunda femoral vein and great saphenous vein unremarkable. No filling defects to  suggest DVT on grayscale or color Doppler imaging. Doppler waveforms show normal direction of venous flow, normal respiratory plasticity and response to augmentation. OTHER None. Limitations: none IMPRESSION: Negative for DVT in the bilateral lower extremities. Electronically Signed   By: Nancy  Ballantyne M.D.   On: 03/16/2021 1Emmaline Kluver2:35   US Venous Img Upper Uni Right(DVT)  Result Date: 03/17/2021 CLINICAL DATA:  Pain and swelling for 1 day EXAM: RIGHT UPPER EXTREMITY VENOUS DOPPLER ULTRASOUND TECHNIQUE: Gray-scale sonography with graded compression, as well as color Doppler and duplex ultrasound were performed to evaluate the upper extremity deep venous system from the level of the subclavian vein and including the jugular, axillary, basilic, radial, ulnar and upper cephalic vein. Spectral Doppler was utilized to evaluate flow at rest and with distal augmentation maneuvers. COMPARISON:  None. FINDINGS: Contralateral Subclavian Vein: Respiratory phasicity is normal and symmetric with the symptomatic side. No evidence of thrombus. Normal compressibility. Internal Jugular Vein: No evidence of thrombus. Normal compressibility, respiratory phasicity and response to augmentation. Subclavian Vein: No evidence of thrombus. Normal compressibility, respiratory phasicity and  response to augmentation. Axillary Vein: No evidence of thrombus. Normal compressibility, respiratory phasicity and response to augmentation. Cephalic Vein: Partially occlusive thrombus noted in the distal upper arm. Basilic Vein: No evidence of thrombus. Normal compressibility, respiratory phasicity and response to augmentation. Brachial Veins: Small amount of nonocclusive thrombus noted in the right brachial vein around the PICC. Radial Veins: No evidence of thrombus. Normal compressibility, respiratory phasicity and response to augmentation. Ulnar Veins: No evidence of thrombus. Normal compressibility, respiratory phasicity and response to augmentation. Venous Reflux:  None visualized. Other Findings:  None visualized. IMPRESSION: 1. Small amount of nonocclusive thrombus noted in the distal upper arm cephalic vein. 2. Small amount of nonocclusive thrombus noted adjacent to PICC in the brachial vein in the mid upper arm. Electronically Signed   By: Acquanetta BellingFarhaan  Mir M.D.   On: 03/17/2021 08:51   US SCROTUM W/DOPPLER  Result Date: 03/16/2021 CLINICAL DATA:  Edema, pain for 2 weeks. EXAM: SCROTAL ULTRASOUND DOPPLER ULTRASOUND OF THE TESTICLES TECHNIQUE: Complete ultrasound examination of the testicles, epididymis, and other scrotal structures was performed. Color and spectral Doppler ultrasound were also utilized to evaluate blood flow to the testicles. COMPARISON:  None. FINDINGS: Right testicle Measurements: 5.1 x 3.0 x 2.8 cm. No mass or microlithiasis visualized. Left testicle Measurements: 4.8 x 3.1 x 3.2 cm. No mass or microlithiasis visualized. Right epididymis:  Normal in size and appearance. Left epididymis:  Normal in size and appearance. Hydrocele:  None visualized. Varicocele:  Unable to evaluate due to extensive scrotal edema. Pulsed Doppler interrogation of both testes demonstrates normal low resistance arterial and venous waveforms bilaterally. Marked bilateral scrotal edema. IMPRESSION: Marked scrotal  edema, otherwise no acute finding sonographically. Electronically Signed   By: Emmaline KluverNancy  Ballantyne M.D.   On: 03/16/2021 12:38    Scheduled Meds: . enoxaparin (LOVENOX) injection  40 mg Subcutaneous Q24H  . furosemide  80 mg Intravenous Q12H  . melatonin  2.5 mg Oral QHS  . metoprolol succinate  25 mg Oral Daily  . nicotine  14 mg Transdermal Daily  . sodium chloride flush  3 mL Intravenous Q12H  . sodium zirconium cyclosilicate  10 g Oral Daily   Continuous Infusions: . sodium chloride    . albumin human 25 g (03/16/21 1729)  . ampicillin (OMNIPEN) IV 2 g (03/17/21 1018)  . cefTRIAXone (ROCEPHIN)  IV 2 g (03/17/21 1113)     LOS: 1 day  Time spent: 40 minutes. More than 50% of the time was spent in counseling/coordination of care  Arnetha Courser, MD [facility]  If 7PM-7AM, please contact night-coverage [email or web address]  03/17/2021, 11:15 AM   This record has been created using Conservation officer, historic buildings. Errors have been sought and corrected,but may not always be located. Such creation errors do not reflect on the standard of care.

## 2021-03-17 NOTE — Consult Note (Signed)
Pharmacy Antibiotic Note  Jerome Mccormick is a 30 y.o. 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 Pt was seen recently from 4/2 admission with moderate MR/mitral chordae in the setting of bacteremia with ARF felt to be 2/2 Enterococcus faecalis. Pt returning today with continuation of abx (CTX + AMP).  Pharmacy has been consulted for Ampicillin dosing.  Plan: Adjust Ampicillin 2g IV q8h to 2g IV q6h for Crcl 33 ml/min  With Ceftriaxone 2g IV q12h   Height: 5\' 7"  (170.2 cm) Weight: 83.9 kg (184 lb 14.4 oz) IBW/kg (Calculated) : 66.1  Temp (24hrs), Avg:98.2 F (36.8 C), Min:97.5 F (36.4 C), Max:98.9 F (37.2 C)  Recent Labs  Lab 03/11/21 0553 03/16/21 0658 03/17/21 0550  WBC 7.4 8.0  --   CREATININE 2.95* 3.26* 3.32*    Estimated Creatinine Clearance: 33.7 mL/min (A) (by C-G formula based on SCr of 3.32 mg/dL (H)).    No Known Allergies  Antimicrobials this admission: Ampicillin (4/16 >>  Ceftriaxone (4/16 >>   Dose adjustments this admission: 4/17 adj Amp 2gm q8h to 2gm q6h  Crcl 33  Microbiology results: 4/5 BCx: Entrococcus Faecalis (Amp/Gent/Vanc-Sensitive) 4/16 BCx: sent  Thank you for allowing pharmacy to be a part of this patient's care.  Anntonette Madewell A 03/17/2021 7:22 AM

## 2021-03-17 NOTE — TOC Progression Note (Signed)
Transition of Care [facility]) - Progression Note    Patient Details  Name: Jerome Mccormick MRN: [id number] Date of Birth: 04-Feb-1991  Transition of Care [facility]) CM/SW Contact  Bing Quarry, RN Phone Number: 03/17/2021, 12:39 PM  Clinical Narrative:  Per provider notes patient may transfer to [facility] in [place] for possible valvular surgery if condition worsens. TOC will monitor for needs. Gabriel Cirri RN CM          Expected Discharge Plan and Services                                                 Social Determinants of Health (SDOH) Interventions    Readmission Risk Interventions No flowsheet data found.

## 2021-03-17 NOTE — Progress Notes (Signed)
Patient R) UE swollen, warm to touch and painful.  Dr Arville Care notified and new orders for U/S of RUE and Morphine PRN pain

## 2021-03-18 ENCOUNTER — Inpatient Hospital Stay (HOSPITAL_COMMUNITY)
Admit: 2021-03-18 | Discharge: 2021-03-18 | Disposition: A | Discharge status: Home / Self Care | Payer: Medicaid Other | Attending: Physician Assistant | Admitting: Physician Assistant

## 2021-03-18 DIAGNOSIS — I340 Nonrheumatic mitral (valve) insufficiency: Secondary | ICD-10-CM

## 2021-03-18 DIAGNOSIS — I059 Rheumatic mitral valve disease, unspecified: Secondary | ICD-10-CM

## 2021-03-18 DIAGNOSIS — N009 Acute nephritic syndrome with unspecified morphologic changes: Secondary | ICD-10-CM

## 2021-03-18 DIAGNOSIS — I330 Acute and subacute infective endocarditis: Secondary | ICD-10-CM

## 2021-03-18 DIAGNOSIS — I361 Nonrheumatic tricuspid (valve) insufficiency: Secondary | ICD-10-CM

## 2021-03-18 LAB — C4 COMPLEMENT: Complement C4, Body Fluid: 10 mg/dL — ABNORMAL LOW (ref 12–38)

## 2021-03-18 LAB — ECHOCARDIOGRAM COMPLETE
AR max vel: 2.95 cm2
AV Area VTI: 3.03 cm2
AV Area mean vel: 2.94 cm2
AV Mean grad: 4 mmHg
AV Peak grad: 8 mmHg
Ao pk vel: 1.41 m/s
Area-P 1/2: 4.29 cm2
Height: 67 in
MV VTI: 2.71 cm2
S' Lateral: 3.5 cm
Weight: 2876.8 oz

## 2021-03-18 LAB — BASIC METABOLIC PANEL
Anion gap: 10 (ref 5–15)
BUN: 56 mg/dL — ABNORMAL HIGH (ref 6–20)
CO2: 26 mmol/L (ref 22–32)
Calcium: 8.2 mg/dL — ABNORMAL LOW (ref 8.9–10.3)
Chloride: 104 mmol/L (ref 98–111)
Creatinine, Ser: 3.13 mg/dL — ABNORMAL HIGH (ref 0.61–1.24)
GFR, Estimated: 26 mL/min — ABNORMAL LOW (ref >60)
Glucose, Bld: 97 mg/dL (ref 70–99)
Potassium: 4.7 mmol/L (ref 3.5–5.1)
Sodium: 140 mmol/L (ref 135–145)

## 2021-03-18 LAB — C3 COMPLEMENT: C3 Complement: 53 mg/dL — ABNORMAL LOW (ref 82–167)

## 2021-03-18 LAB — VITAMIN B12: Vitamin B-12: 581 pg/mL (ref 180–914)

## 2021-03-18 LAB — IRON AND TIBC
Iron: 26 ug/dL — ABNORMAL LOW (ref 45–182)
Saturation Ratios: 11 % — ABNORMAL LOW (ref 17.9–39.5)
TIBC: 232 ug/dL — ABNORMAL LOW (ref 250–450)
UIBC: 206 ug/dL

## 2021-03-18 LAB — HEPARIN LEVEL (UNFRACTIONATED): Heparin Unfractionated: <0.1 IU/mL — ABNORMAL LOW (ref 0.30–0.70)

## 2021-03-18 MED ORDER — HEPARIN BOLUS VIA INFUSION
5000.0000 [IU] | Freq: Once | INTRAVENOUS | Status: AC
  Administered 2021-03-18: 5000 [IU] via INTRAVENOUS
  Filled 2021-03-18: qty 5000

## 2021-03-18 MED ORDER — HEPARIN BOLUS VIA INFUSION
2000.0000 [IU] | Freq: Once | INTRAVENOUS | Status: AC
  Administered 2021-03-18: 2000 [IU] via INTRAVENOUS
  Filled 2021-03-18: qty 2000

## 2021-03-18 MED ORDER — METHYLPREDNISOLONE SODIUM SUCC 125 MG IJ SOLR
125.0000 mg | INTRAMUSCULAR | Status: DC
Start: 2021-03-19 — End: 2021-03-19
  Administered 2021-03-19: 125 mg via INTRAVENOUS
  Filled 2021-03-18: qty 2

## 2021-03-18 MED ORDER — HEPARIN (PORCINE) 25000 UT/250ML-% IV SOLN
1400.0000 [IU]/h | INTRAVENOUS | Status: DC
  Administered 2021-03-18: 1400 [IU]/h via INTRAVENOUS
  Filled 2021-03-18: qty 250

## 2021-03-18 MED ORDER — HEPARIN (PORCINE) 25000 UT/250ML-% IV SOLN
1600.0000 [IU]/h | INTRAVENOUS | Status: DC
  Administered 2021-03-18: 1600 [IU]/h via INTRAVENOUS
  Filled 2021-03-18: qty 250

## 2021-03-18 MED ORDER — TORSEMIDE 20 MG PO TABS
100.0000 mg | ORAL_TABLET | Freq: Every day | ORAL | Status: DC
  Administered 2021-03-18 – 2021-03-19 (×2): 100 mg via ORAL
  Filled 2021-03-18 (×2): qty 5

## 2021-03-18 NOTE — Progress Notes (Signed)
Progress Note  Patient Name: Jerome Mccormick Date of Encounter: 03/18/2021  Primary Cardiologist: Agbor-Etang  Subjective   Dyspnea is much improved. Able to ambulate. Documented UOP 240 mL for the past 24 hours with a net 1 ~ 1 L for the admission. Weight 83.9-->81.6 kg. Renal function and potassium are improving.   Inpatient Medications    Scheduled Meds: . carvedilol  6.25 mg Oral BID WC  . Chlorhexidine Gluconate Cloth  6 each Topical Daily  . enoxaparin (LOVENOX) injection  40 mg Subcutaneous Q24H  . furosemide  80 mg Intravenous Q12H  . melatonin  2.5 mg Oral QHS  . nicotine  14 mg Transdermal Daily  . sodium chloride flush  3 mL Intravenous Q12H   Continuous Infusions: . sodium chloride    . albumin human 25 g (03/17/21 2031)  . ampicillin (OMNIPEN) IV 2 g (03/18/21 0424)  . cefTRIAXone (ROCEPHIN)  IV 2 g (03/17/21 2227)   PRN Meds: sodium chloride, acetaminophen, butalbital-acetaminophen-caffeine, morphine injection, ondansetron (ZOFRAN) IV, sodium chloride flush, traZODone   Vital Signs    Vitals:   03/17/21 1632 03/17/21 2041 03/18/21 0433 03/18/21 0808  BP: (!) 130/94 (!) 143/99 (!) 126/91 (!) 130/92  Pulse:  95 84 85  Resp:  16 16   Temp: 98.9 F (37.2 C) 98.4 F (36.9 C)  98.4 F (36.9 C)  TempSrc:  Oral  Oral  SpO2: 99% 99% 92% 95%  Weight:      Height:        Intake/Output Summary (Last 24 hours) at 03/18/2021 0828 Last data filed at 03/18/2021 0200 Gross per 24 hour  Intake 1060 ml  Output 1300 ml  Net -240 ml   Filed Weights   03/16/21 0633 03/17/21 0409 03/17/21 0900  Weight: 62.1 kg 83.9 kg 81.6 kg    Telemetry    SR, 70s bpm - Personally Reviewed  ECG    No new tracings - Personally Reviewed  Physical Exam   GEN: No acute distress.   Neck: No JVD. Cardiac: RRR, III/VI systolic murmur LLSB radiating to the apex, no rubs, or gallops.  Respiratory: Mildly diminished breath sounds along the bilateral bases. MS: Trace  bilateral pedal and pretibial edema; No deformity. Neuro:  Alert and oriented x 3; Nonfocal.  Psych: Normal affect.  Labs    Chemistry Recent Labs  Lab 03/16/21 0658 03/17/21 0550 03/18/21 0530  NA 138 138 140  K 5.7* 5.3* 4.7  CL 107 102 104  CO2 23 25 26   GLUCOSE 97 95 97  BUN 69* 64* 56*  CREATININE 3.26* 3.32* 3.13*  CALCIUM 8.9 8.4* 8.2*  PROT 6.3*  --   --   ALBUMIN 2.6*  --   --   AST 27  --   --   ALT 23  --   --   ALKPHOS 33*  --   --   BILITOT 0.5  --   --   GFRNONAA 25* 25* 26*  ANIONGAP 8 11 10      Hematology Recent Labs  Lab 03/16/21 0658  WBC 8.0  RBC 3.13*  HGB 8.6*  HCT 27.9*  MCV 89.1  MCH 27.5  MCHC 30.8  RDW 14.9  PLT 218    Cardiac EnzymesNo results for input(s): TROPONINI in the last 168 hours. No results for input(s): TROPIPOC in the last 168 hours.   BNP Recent Labs  Lab 03/16/21 0658  BNP 570.3*     DDimer No results for input(s):  DDIMER in the last 168 hours.   Radiology    US Venous Img Lower Bilateral (DVT)  Result Date: 03/16/2021 IMPRESSION: Negative for DVT in the bilateral lower extremities. Electronically Signed   By: Emmaline Kluver M.D.   On: 03/16/2021 12:35   US Venous Img Upper Uni Right(DVT)  Result Date: 03/17/2021 IMPRESSION: 1. Small amount of nonocclusive thrombus noted in the distal upper arm cephalic vein. 2. Small amount of nonocclusive thrombus noted adjacent to PICC in the brachial vein in the mid upper arm. Electronically Signed   By: Acquanetta Belling M.D.   On: 03/17/2021 08:51   US SCROTUM W/DOPPLER  Result Date: 03/16/2021 IMPRESSION: Marked scrotal edema, otherwise no acute finding sonographically. Electronically Signed   By: Emmaline Kluver M.D.   On: 03/16/2021 12:38    Cardiac Studies   2D echo pending __________  TEE 03/06/2021: 1. Left ventricular ejection fraction, by estimation, is 60 to 65%. The  left ventricle has normal function. The left ventricle has no regional  wall motion  abnormalities.  2. Right ventricular systolic function is normal. The right ventricular  size is normal.  3. No left atrial/left atrial appendage thrombus was detected.  4. 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  The mitral valve is abnormal. Mild to moderate mitral valve regurgitation.  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  5. The aortic valve is tricuspid. Aortic valve regurgitation is not  visualized.  __________  2D echo 03/03/2021: 1. Left ventricular ejection fraction, by estimation, is 60 to 65%. The  left ventricle has normal function. The left ventricle has no regional  wall motion abnormalities. Left ventricular diastolic parameters were  normal.  2. Right ventricular systolic function is normal. The right ventricular  size is normal. There is mildly elevated pulmonary artery systolic  pressure. The estimated right ventricular systolic pressure is 38.3 mmHg.  3. Left atrial size was moderately dilated.  4. The mitral valve is abnormal.Posterior leaflet with prolapse, unable  to exclude partially flail leaflet or torn chordea (images 30-32) Moderate  eccentric mitral valve regurgitation. No evidence of mitral stenosis.  5. The inferior vena cava is dilated in size with >50% respiratory  variability, suggesting right atrial pressure of 8 mmHg.   Patient Profile     30 y.o. male with history of alcohol and tobacco use who was recently admitted to [facility] this month with sob, edema and aki. He was found to have moderate mitral regurgitation and possible flail posterior leaflet with transesophageal echo confirming mitral valve pathology and unable to exclude endocarditis. He was discharged on 4/11 with outpatient follow up scheduled for this week with CVTS. He was readmitted to [facility] on 4/16 with acute on chronic HFpEF.  Assessment & Plan    1. Acute on chronic HFpEF with anasarca: -Volume status is  improving -IV Lasix per nephrology  -Coreg -Daily weights -Strict I/O  2.  Moderate mitral regurgitation with rupture chordae: -Repeat echo pending -He has an appointment scheduled with Dr. Cornelius Moras, with CVTS, on 4/20 -Hopefully he can be discharged within the next 24 hours to keep his appointment with Dr. Cornelius Moras   3. AKI with nephrotic range proteinuria: -Renal function is improved slightly  -IV Lasix per nephrology, appreciate their assistance  -Likely contributing to his swelling with third spacing   4. Enterococcus bacteremia: -No growth in repeat blood cultures to date x 2 for 2 days -Remains on IV ABX  4. Anemia: -HGB low, though stable   For questions or updates, please contact [facility] Please consult [email or web address] for contact info under Cardiology/STEMI.    Signed, Eula Listen, PA-C [facility] Pager: [phone] 03/18/2021, 8:28 AM

## 2021-03-18 NOTE — Progress Notes (Signed)
ANTICOAGULATION CONSULT NOTE  Pharmacy Consult for heparin infusion Indication: DVT  No Known Allergies  Patient Measurements: Height: 5\' 7"  (170.2 cm) Weight: 81.6 kg (179 lb 12.8 oz) IBW/kg (Calculated) : 66.1  Vital Signs: Temp: 98.8 F (37.1 C) (04/18 2019) Temp Source: Oral (04/18 2019) BP: 137/95 (04/18 2019) Pulse Rate: 80 (04/18 2019)  Labs: Recent Labs    03/16/21 0658 03/17/21 0550 03/18/21 0530 03/18/21 2225  HGB 8.6*  --   --   --   HCT 27.9*  --   --   --   PLT 218  --   --   --   HEPARINUNFRC  --   --   --  <0.10*  CREATININE 3.26* 3.32* 3.13*  --   TROPONINIHS 8  --   --   --     Estimated Creatinine Clearance: 35.3 mL/min (A) (by C-G formula based on SCr of 3.13 mg/dL (H)).   Medical History: Past Medical History:  Diagnosis Date  . Allergy   . Headache   . Postinfectious glomerulonephritis     Medications:  Scheduled:  . carvedilol  6.25 mg Oral BID WC  . Chlorhexidine Gluconate Cloth  6 each Topical Daily  . [START ON 03/19/2021] heparin  2,000 Units Intravenous Once  . melatonin  2.5 mg Oral QHS  . [START ON 03/19/2021] methylPREDNISolone (SOLU-MEDROL) injection  125 mg Intravenous Q24H  . nicotine  14 mg Transdermal Daily  . sodium chloride flush  3 mL Intravenous Q12H  . torsemide  100 mg Oral Daily    Assessment: 30 y.o. 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 An ultrasound was performed on 4/17 which revealed a small amount of nonocclusive thrombus noted in the distal upper arm cephalic vein and a small amount of nonocclusive thrombus noted adjacent to PICC in the brachial vein in the mid upper arm. Pharmacy has been asked to start start heparin. Prior to this order he was on prophylactic LMWH with  the last dose of 40 mg administered at 2221 on 4/18  Goal of Therapy:  Heparin level 0.3-0.7 units/ml Monitor platelets by anticoagulation protocol: Yes   4/18 2225 HL <0.1  Plan:   Give 2000 units bolus x 1  Increase heparin infusion to 1600 units/hr  Recheck HL 6 hours and daily while on heparin  Continue to monitor H&H and platelets  5/18, PharmD, [facility] 03/18/2021 11:46 PM\

## 2021-03-18 NOTE — Progress Notes (Signed)
*  PRELIMINARY RESULTS* Echocardiogram 2D Echocardiogram has been performed.  Jerome Mccormick 03/18/2021, 11:36 AM

## 2021-03-18 NOTE — Progress Notes (Signed)
ANTICOAGULATION CONSULT NOTE  Pharmacy Consult for heparin infusion Indication: DVT  No Known Allergies  Patient Measurements: Height: 5\' 7"  (170.2 cm) Weight: 81.6 kg (179 lb 12.8 oz) IBW/kg (Calculated) : 66.1  Vital Signs: Temp: 98.4 F (36.9 C) (04/18 0808) Temp Source: Oral (04/18 0808) BP: 130/92 (04/18 0808) Pulse Rate: 85 (04/18 0808)  Labs: Recent Labs    03/16/21 0658 03/17/21 0550 03/18/21 0530  HGB 8.6*  --   --   HCT 27.9*  --   --   PLT 218  --   --   CREATININE 3.26* 3.32* 3.13*  TROPONINIHS 8  --   --     Estimated Creatinine Clearance: 35.3 mL/min (A) (by C-G formula based on SCr of 3.13 mg/dL (H)).   Medical History: Past Medical History:  Diagnosis Date  . Allergy   . Headache   . Postinfectious glomerulonephritis     Medications:  Scheduled:  . carvedilol  6.25 mg Oral BID WC  . Chlorhexidine Gluconate Cloth  6 each Topical Daily  . enoxaparin (LOVENOX) injection  40 mg Subcutaneous Q24H  . furosemide  80 mg Intravenous Q12H  . melatonin  2.5 mg Oral QHS  . nicotine  14 mg Transdermal Daily  . sodium chloride flush  3 mL Intravenous Q12H    Assessment: 30 y.o. 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 An ultrasound was performed on 4/17 which revealed a small amount of nonocclusive thrombus noted in the distal upper arm cephalic vein and a small amount of nonocclusive thrombus noted adjacent to PICC in the brachial vein in the mid upper arm. Pharmacy has been asked to start start heparin. Prior to this order he was on prophylactic LMWH with the last dose of 40 mg administered at 2221 on 4/18  Goal of Therapy:  Heparin level 0.3-0.7 units/ml Monitor platelets by anticoagulation protocol: Yes   Plan:    Stop enoxaparin  Give 5000 units bolus x 1  Start heparin infusion at 1400 units/hr  Check anti-Xa level in 8 hours and daily while on heparin  Continue to monitor H&H and platelets  5/18 03/18/2021,2:05 PM

## 2021-03-18 NOTE — Progress Notes (Signed)
[facility]  ROUNDING NOTE   Subjective:   Mr. Jerome Mccormick was admitted to [facility] on 03/16/2021 for Acute kidney failure, unspecified (HCC) [N17.9] Anasarca [R60.1] Generalized edema [R60.1] Scrotal pain [N50.82] Acute diastolic CHF (congestive heart failure) (HCC) [I50.31]  UOP 1300 - furosemide 80mg  IV q12.   Patient states his peripheral edema has improved.     Objective:  Vital signs in last 24 hours:  Temp:  [98.4 F (36.9 C)-98.9 F (37.2 C)] 98.4 F (36.9 C) (04/18 0808) Pulse Rate:  [84-95] 85 (04/18 0808) Resp:  [16] 16 (04/18 0433) BP: (126-143)/(91-99) 130/92 (04/18 0808) SpO2:  [92 %-99 %] 95 % (04/18 0808)  Weight change: -2.313 kg Filed Weights   03/16/21 0633 03/17/21 0409 03/17/21 0900  Weight: 62.1 kg 83.9 kg 81.6 kg    Intake/Output: I/O last 3 completed shifts: In: 1300 [P.O.:1200; IV Piggyback:100] Out: 2075 [Urine:2075]   Intake/Output this shift:  Total I/O In: 180 [P.O.:180] Out: 825 [Urine:825]  Physical Exam: General: NAD, sitting up in bed  Head: Normocephalic, atraumatic. Moist oral mucosal membranes  Eyes: Anicteric, PERRL  Neck: Supple, trachea midline  Lungs:  clear  Heart: Regular rate and rhythm  Abdomen:  Soft, nontender,   Extremities: + peripheral edema.  Neurologic: Nonfocal, moving all four extremities  Skin: No lesions  Access: none    Basic Metabolic Panel: Recent Labs  Lab 03/16/21 0658 03/17/21 0550 03/18/21 0530  NA 138 138 140  K 5.7* 5.3* 4.7  CL 107 102 104  CO2 23 25 26   GLUCOSE 97 95 97  BUN 69* 64* 56*  CREATININE 3.26* 3.32* 3.13*  CALCIUM 8.9 8.4* 8.2*    Liver Function Tests: Recent Labs  Lab 03/16/21 0658  AST 27  ALT 23  ALKPHOS 33*  BILITOT 0.5  PROT 6.3*  ALBUMIN 2.6*   No results for input(s): LIPASE, AMYLASE in the last 168 hours. No results for input(s): AMMONIA in the last 168 hours.  CBC: Recent Labs  Lab 03/16/21 0658  WBC 8.0  NEUTROABS 6.0  HGB 8.6*   HCT 27.9*  MCV 89.1  PLT 218    Cardiac Enzymes: No results for input(s): CKTOTAL, CKMB, CKMBINDEX, TROPONINI in the last 168 hours.  BNP: Invalid input(s): POCBNP  CBG: No results for input(s): GLUCAP in the last 168 hours.  Microbiology: Results for orders placed or performed during the hospital encounter of 03/16/21  Resp Panel by RT-PCR (Flu A&B, Covid) Nasopharyngeal Swab     Status: None   Collection Time: 03/16/21  7:02 AM   Specimen: Nasopharyngeal Swab; Nasopharyngeal(NP) swabs in vial transport medium  Result Value Ref Range Status   SARS Coronavirus 2 by RT PCR NEGATIVE NEGATIVE Final    Comment: (NOTE) SARS-CoV-2 target nucleic acids are NOT DETECTED.  The SARS-CoV-2 RNA is generally detectable in upper respiratory specimens during the acute phase of infection. The lowest concentration of SARS-CoV-2 viral copies this assay can detect is 138 copies/mL. A negative result does not preclude SARS-Cov-2 infection and should not be used as the sole basis for treatment or other patient management decisions. A negative result may occur with  improper specimen collection/handling, submission of specimen other than nasopharyngeal swab, presence of viral mutation(s) within the areas targeted by this assay, and inadequate number of viral copies(<138 copies/mL). A negative result must be combined with clinical observations, patient history, and epidemiological information. The expected result is Negative.  Fact Sheet for Patients:  03/18/21  Fact Sheet for  Healthcare Providers:  SeriousBroker.it  This test is no t yet approved or cleared by the [place] and  has been authorized for detection and/or diagnosis of SARS-CoV-2 by FDA under an Emergency Use Authorization (EUA). This EUA will remain  in effect (meaning this test can be used) for the duration of the COVID-19 declaration under Section 564(b)(1)  of the Act, 21 U.S.C.section 360bbb-3(b)(1), unless the authorization is terminated  or revoked sooner.       Influenza A by PCR NEGATIVE NEGATIVE Final   Influenza B by PCR NEGATIVE NEGATIVE Final    Comment: (NOTE) The Xpert Xpress SARS-CoV-2/FLU/RSV plus assay is intended as an aid in the diagnosis of influenza from Nasopharyngeal swab specimens and should not be used as a sole basis for treatment. Nasal washings and aspirates are unacceptable for Xpert Xpress SARS-CoV-2/FLU/RSV testing.  Fact Sheet for Patients: BloggerCourse.com  Fact Sheet for Healthcare Providers: SeriousBroker.it  This test is not yet approved or cleared by the [place] FDA and has been authorized for detection and/or diagnosis of SARS-CoV-2 by FDA under an Emergency Use Authorization (EUA). This EUA will remain in effect (meaning this test can be used) for the duration of the COVID-19 declaration under Section 564(b)(1) of the Act, 21 U.S.C. section 360bbb-3(b)(1), unless the authorization is terminated or revoked.  Performed at [facility], [address] [place], [place] [address]   Culture, blood (routine x 2)     Status: None (Preliminary result)   Collection Time: 03/16/21  2:07 PM   Specimen: BLOOD  Result Value Ref Range Status   Specimen Description BLOOD BLOOD LEFT ARM  Final   Special Requests   Final    BOTTLES DRAWN AEROBIC AND ANAEROBIC Blood Culture adequate volume   Culture   Final    NO GROWTH 2 DAYS Performed at [facility], [address], [place], [place] [address]    Report Status PENDING  Incomplete  Culture, blood (routine x 2)     Status: None (Preliminary result)   Collection Time: 03/16/21  2:07 PM   Specimen: BLOOD  Result Value Ref Range Status   Specimen Description BLOOD BLOOD LEFT HAND  Final   Special Requests   Final    BOTTLES DRAWN AEROBIC AND ANAEROBIC Blood Culture adequate volume    Culture   Final    NO GROWTH 2 DAYS Performed at [facility], [address], [place], [place] [address]    Report Status PENDING  Incomplete    Coagulation Studies: No results for input(s): LABPROT, INR in the last 72 hours.  Urinalysis: Recent Labs    03/16/21 1056  COLORURINE YELLOW*  LABSPEC 1.013  PHURINE 5.0  GLUCOSEU NEGATIVE  HGBUR LARGE*  BILIRUBINUR NEGATIVE  KETONESUR NEGATIVE  PROTEINUR >=300*  NITRITE NEGATIVE  LEUKOCYTESUR SMALL*      Imaging: US Venous Img Upper Uni Right(DVT)  Result Date: 03/17/2021 CLINICAL DATA:  Pain and swelling for 1 day EXAM: RIGHT UPPER EXTREMITY VENOUS DOPPLER ULTRASOUND TECHNIQUE: Gray-scale sonography with graded compression, as well as color Doppler and duplex ultrasound were performed to evaluate the upper extremity deep venous system from the level of the subclavian vein and including the jugular, axillary, basilic, radial, ulnar and upper cephalic vein. Spectral Doppler was utilized to evaluate flow at rest and with distal augmentation maneuvers. COMPARISON:  None. FINDINGS: Contralateral Subclavian Vein: Respiratory phasicity is normal and symmetric with the symptomatic side. No evidence of thrombus. Normal compressibility. Internal Jugular Vein:  No evidence of thrombus. Normal compressibility, respiratory phasicity and response to augmentation. Subclavian Vein: No evidence of thrombus. Normal compressibility, respiratory phasicity and response to augmentation. Axillary Vein: No evidence of thrombus. Normal compressibility, respiratory phasicity and response to augmentation. Cephalic Vein: Partially occlusive thrombus noted in the distal upper arm. Basilic Vein: No evidence of thrombus. Normal compressibility, respiratory phasicity and response to augmentation. Brachial Veins: Small amount of nonocclusive thrombus noted in the right brachial vein around the PICC. Radial Veins: No evidence of thrombus. Normal compressibility,  respiratory phasicity and response to augmentation. Ulnar Veins: No evidence of thrombus. Normal compressibility, respiratory phasicity and response to augmentation. Venous Reflux:  None visualized. Other Findings:  None visualized. IMPRESSION: 1. Small amount of nonocclusive thrombus noted in the distal upper arm cephalic vein. 2. Small amount of nonocclusive thrombus noted adjacent to PICC in the brachial vein in the mid upper arm. Electronically Signed   By: Acquanetta Belling M.D.   On: 03/17/2021 08:51     Medications:   . sodium chloride    . albumin human 25 g (03/18/21 1400)  . ampicillin (OMNIPEN) IV 2 g (03/18/21 0948)  . cefTRIAXone (ROCEPHIN)  IV 2 g (03/18/21 1103)  . heparin     . carvedilol  6.25 mg Oral BID WC  . Chlorhexidine Gluconate Cloth  6 each Topical Daily  . furosemide  80 mg Intravenous Q12H  . heparin  5,000 Units Intravenous Once  . melatonin  2.5 mg Oral QHS  . nicotine  14 mg Transdermal Daily  . sodium chloride flush  3 mL Intravenous Q12H   sodium chloride, acetaminophen, butalbital-acetaminophen-caffeine, morphine injection, ondansetron (ZOFRAN) IV, sodium chloride flush, traZODone  Assessment/ Plan:  Mr. ABDOU STOCKS is a 30 y.o. white male with no past medical history with post infectious glomerulonephritis biopsy proven admitted to [facility] on 03/16/2021 for Acute kidney failure, unspecified (HCC) [N17.9] Anasarca [R60.1] Generalized edema [R60.1] Scrotal pain [N50.82] Acute diastolic CHF (congestive heart failure) (HCC) [I50.31]  Father was on dialysis. Patient is a tobacco and THC smoker. Had TCA in urine tox screen.   1. Acute kidney injury: post infectious glomerulonephritis biopsy on 03/04/21. Baseline creatinine of 0.96, normal GFR in 03/11/2014.  2. Anasarca 3. Nephrotic range proteinuria: 5.44g 4. Hematuria 5. Hypertension  Patient has failed outpatient diuretics. No indication for dialysis at this time.  - Change to PO torsemide.  - Continue IV  antibiotics. Ampicillin and ceftriaxone - persistent low complements.  - Continue metoprolol - Symptoms can last for months. Will do literature search to see if systemic steroids will be of benefit for this patient.  - Appreciate cardiology and ID input.    LOS: 2 Tytionna Cloyd 4/18/20222:19 PM

## 2021-03-18 NOTE — Progress Notes (Signed)
PROGRESS NOTE    CASMERE HOLLENBECK  UDJ:[id number] DOB: 1991-06-13 DOA: 03/16/2021 PCP: Patient, No Pcp Per (Inactive)    Brief Narrative:  Adan Sis a 30 y.o.malewith medical history significant forrecent diagnosis of enterococcal endocarditis discharged home on IV antibiotics(Rocephin 2 g IV every 12 and ampicillin 8 g IV daily for 6 weeks,last dose05/17/22),history of migraine headaches as well as postinfectious glomerulonephritis whowas discharged from the hospital 5 days prior to his presentation today with complaints of worsening lower extremity swelling. Patient was found to have worsening renal function, chest x-ray showed diffusely increased pulmonary interstitial opacity, consistent with interstitial edema. Echocardiogram showed mitral prolapse with ruptured posterior chordae.  Assessment & Plan:   Principal Problem:   Acute diastolic CHF (congestive heart failure) (HCC) Active Problems:   Nonrheumatic mitral valve regurgitation   History of anemia due to chronic kidney disease   Nicotine dependence   Hyperkalemia   Acute glomerulonephritis syndrome   Bacterial endocarditis  #1.  Acute diastolic congestive heart failure. Mitral valve prolapse with moderate mitral regurgitation. Enterococcal endocarditis. Patient has been followed by cardiology, mitral valve changes could be due to endocarditis.  Repeat echocardiogram is pending, per cardiology, if valvular changes are worsened, patient needed transferred to [facility] for valvular surgery. Currently, patient is still on ampicillin and ceftriaxone until 5/17.  #2.  Acute kidney injury secondary to acute postinfectious glomerulonephritis. Hyperkalemia Patient has been seen by nephrology, diuretics changed to oral. Renal function appears to be gradually improving. Potassium level has normalized.  #3.  Right arm DVT. This appears to be secondary to PICC line. Reviewed literature from up-to-date,  recommendation 3 months of anticoagulation, may not need remove PICC line. Since we are not sure patient will need valvular surgery, will start with heparin.  If no surgery is needed, will change to Eliquis.  #4.  Migraine headaches. Continue as needed Fioricet.  #5.  Anemia. Check iron and B12 level.    DVT prophylaxis: Heparin Code Status: Full Family Communication:  Disposition Plan:  .   Status is: Inpatient  Remains inpatient appropriate because:Inpatient level of care appropriate due to severity of illness   Dispo: The patient is from: Home              Anticipated d/c is to: Home              Patient currently is not medically stable to d/c.   Difficult to place patient No        I/O last 3 completed shifts: In: 1300 [P.O.:1200; IV Piggyback:100] Out: 2075 [Urine:2075] Total I/O In: 360 [P.O.:360] Out: 825 [Urine:825]     Consultants:   Cardiology and nephrology.  Procedures: None  Antimicrobials:   Rocephin and Unasyn.   Subjective: Patient doing well today, he has some mild headache, no dizziness. No fever or chills. He does not have any chest pain or shortness of breath.  No cough. No dysuria or hematuria. No abdominal pain or nausea vomiting.  Objective: Vitals:   03/17/21 1632 03/17/21 2041 03/18/21 0433 03/18/21 0808  BP: (!) 130/94 (!) 143/99 (!) 126/91 (!) 130/92  Pulse:  95 84 85  Resp:  16 16   Temp: 98.9 F (37.2 C) 98.4 F (36.9 C)  98.4 F (36.9 C)  TempSrc:  Oral  Oral  SpO2: 99% 99% 92% 95%  Weight:      Height:        Intake/Output Summary (Last 24 hours) at 03/18/2021 1432 Last data  filed at 03/18/2021 1425 Gross per 24 hour  Intake 840 ml  Output 1925 ml  Net -1085 ml   Filed Weights   03/16/21 0633 03/17/21 0409 03/17/21 0900  Weight: 62.1 kg 83.9 kg 81.6 kg    Examination:  General exam: Appears calm and comfortable  Respiratory system: Clear to auscultation. Respiratory effort normal. Cardiovascular  system: S1 & S2 heard, RRR. 2/6 SM,  No pedal edema. Gastrointestinal system: Abdomen is nondistended, soft and nontender. No organomegaly or masses felt. Normal bowel sounds heard. Central nervous system: Alert and oriented. No focal neurological deficits. Extremities: Symmetric 5 x 5 power. Skin: No rashes, lesions or ulcers Psychiatry: Judgement and insight appear normal. Mood & affect appropriate.     Data Reviewed: I have personally reviewed following labs and imaging studies  CBC: Recent Labs  Lab 03/16/21 0658  WBC 8.0  NEUTROABS 6.0  HGB 8.6*  HCT 27.9*  MCV 89.1  PLT 218   Basic Metabolic Panel: Recent Labs  Lab 03/16/21 0658 03/17/21 0550 03/18/21 0530  NA 138 138 140  K 5.7* 5.3* 4.7  CL 107 102 104  CO2 23 25 26   GLUCOSE 97 95 97  BUN 69* 64* 56*  CREATININE 3.26* 3.32* 3.13*  CALCIUM 8.9 8.4* 8.2*   GFR: Estimated Creatinine Clearance: 35.3 mL/min (A) (by C-G formula based on SCr of 3.13 mg/dL (H)). Liver Function Tests: Recent Labs  Lab 03/16/21 0658  AST 27  ALT 23  ALKPHOS 33*  BILITOT 0.5  PROT 6.3*  ALBUMIN 2.6*   No results for input(s): LIPASE, AMYLASE in the last 168 hours. No results for input(s): AMMONIA in the last 168 hours. Coagulation Profile: No results for input(s): INR, PROTIME in the last 168 hours. Cardiac Enzymes: No results for input(s): CKTOTAL, CKMB, CKMBINDEX, TROPONINI in the last 168 hours. BNP (last 3 results) No results for input(s): PROBNP in the last 8760 hours. HbA1C: No results for input(s): HGBA1C in the last 72 hours. CBG: No results for input(s): GLUCAP in the last 168 hours. Lipid Profile: No results for input(s): CHOL, HDL, LDLCALC, TRIG, CHOLHDL, LDLDIRECT in the last 72 hours. Thyroid Function Tests: No results for input(s): TSH, T4TOTAL, FREET4, T3FREE, THYROIDAB in the last 72 hours. Anemia Panel: No results for input(s): VITAMINB12, FOLATE, FERRITIN, TIBC, IRON, RETICCTPCT in the last 72  hours. Sepsis Labs: No results for input(s): PROCALCITON, LATICACIDVEN in the last 168 hours.  Recent Results (from the past 240 hour(s))  CULTURE, BLOOD (ROUTINE X 2) w Reflex to ID Panel     Status: None   Collection Time: 03/09/21  9:44 AM   Specimen: BLOOD  Result Value Ref Range Status   Specimen Description BLOOD LEFT ANTECUBITAL  Final   Special Requests   Final    BOTTLES DRAWN AEROBIC AND ANAEROBIC Blood Culture results may not be optimal due to an excessive volume of blood received in culture bottles   Culture   Final    NO GROWTH 5 DAYS Performed at [facility], [address] [place], [place]    Report Status 03/14/2021 FINAL  Final  CULTURE, BLOOD (ROUTINE X 2) w Reflex to ID Panel     Status: None   Collection Time: 03/09/21  9:46 AM   Specimen: BLOOD  Result Value Ref Range Status   Specimen Description BLOOD RIGHT ARM  Final   Special Requests   Final    BOTTLES DRAWN AEROBIC AND ANAEROBIC Blood Culture adequate volume  Culture   Final    NO GROWTH 5 DAYS Performed at [facility], [address] [place], [place] [address]    Report Status 03/14/2021 FINAL  Final  Resp Panel by RT-PCR (Flu A&B, Covid) Nasopharyngeal Swab     Status: None   Collection Time: 03/16/21  7:02 AM   Specimen: Nasopharyngeal Swab; Nasopharyngeal(NP) swabs in vial transport medium  Result Value Ref Range Status   SARS Coronavirus 2 by RT PCR NEGATIVE NEGATIVE Final    Comment: (NOTE) SARS-CoV-2 target nucleic acids are NOT DETECTED.  The SARS-CoV-2 RNA is generally detectable in upper respiratory specimens during the acute phase of infection. The lowest concentration of SARS-CoV-2 viral copies this assay can detect is 138 copies/mL. A negative result does not preclude SARS-Cov-2 infection and should not be used as the sole basis for treatment or other patient management decisions. A negative result may occur with  improper specimen  collection/handling, submission of specimen other than nasopharyngeal swab, presence of viral mutation(s) within the areas targeted by this assay, and inadequate number of viral copies(<138 copies/mL). A negative result must be combined with clinical observations, patient history, and epidemiological information. The expected result is Negative.  Fact Sheet for Patients:  BloggerCourse.comhttps://www.fda.gov/media/152166/download  Fact Sheet for Healthcare Providers:  SeriousBroker.ithttps://www.fda.gov/media/152162/download  This test is no t yet approved or cleared by the [place] FDA and  has been authorized for detection and/or diagnosis of SARS-CoV-2 by FDA under an Emergency Use Authorization (EUA). This EUA will remain  in effect (meaning this test can be used) for the duration of the COVID-19 declaration under Section 564(b)(1) of the Act, 21 U.S.C.section 360bbb-3(b)(1), unless the authorization is terminated  or revoked sooner.       Influenza A by PCR NEGATIVE NEGATIVE Final   Influenza B by PCR NEGATIVE NEGATIVE Final    Comment: (NOTE) The Xpert Xpress SARS-CoV-2/FLU/RSV plus assay is intended as an aid in the diagnosis of influenza from Nasopharyngeal swab specimens and should not be used as a sole basis for treatment. Nasal washings and aspirates are unacceptable for Xpert Xpress SARS-CoV-2/FLU/RSV testing.  Fact Sheet for Patients: BloggerCourse.comhttps://www.fda.gov/media/152166/download  Fact Sheet for Healthcare Providers: SeriousBroker.ithttps://www.fda.gov/media/152162/download  This test is not yet approved or cleared by the [place] FDA and has been authorized for detection and/or diagnosis of SARS-CoV-2 by FDA under an Emergency Use Authorization (EUA). This EUA will remain in effect (meaning this test can be used) for the duration of the COVID-19 declaration under Section 564(b)(1) of the Act, 21 U.S.C. section 360bbb-3(b)(1), unless the authorization is terminated or revoked.  Performed at [facility], [address] [place], [place] [address]   Culture, blood (routine x 2)     Status: None (Preliminary result)   Collection Time: 03/16/21  2:07 PM   Specimen: BLOOD  Result Value Ref Range Status   Specimen Description BLOOD BLOOD LEFT ARM  Final   Special Requests   Final    BOTTLES DRAWN AEROBIC AND ANAEROBIC Blood Culture adequate volume   Culture   Final    NO GROWTH 2 DAYS Performed at [facility], [address] [place], [place] [address]    Report Status PENDING  Incomplete  Culture, blood (routine x 2)     Status: None (Preliminary result)   Collection Time: 03/16/21  2:07 PM   Specimen: BLOOD  Result Value Ref Range Status   Specimen Description BLOOD BLOOD LEFT HAND  Final   Special Requests  Final    BOTTLES DRAWN AEROBIC AND ANAEROBIC Blood Culture adequate volume   Culture   Final    NO GROWTH 2 DAYS Performed at [facility], [address] [place], [place] [address]    Report Status PENDING  Incomplete         Radiology Studies: US Venous Img Upper Uni Right(DVT)  Result Date: 03/17/2021 CLINICAL DATA:  Pain and swelling for 1 day EXAM: RIGHT UPPER EXTREMITY VENOUS DOPPLER ULTRASOUND TECHNIQUE: Gray-scale sonography with graded compression, as well as color Doppler and duplex ultrasound were performed to evaluate the upper extremity deep venous system from the level of the subclavian vein and including the jugular, axillary, basilic, radial, ulnar and upper cephalic vein. Spectral Doppler was utilized to evaluate flow at rest and with distal augmentation maneuvers. COMPARISON:  None. FINDINGS: Contralateral Subclavian Vein: Respiratory phasicity is normal and symmetric with the symptomatic side. No evidence of thrombus. Normal compressibility. Internal Jugular Vein: No evidence of thrombus. Normal compressibility, respiratory phasicity and response to augmentation. Subclavian Vein: No evidence of thrombus. Normal  compressibility, respiratory phasicity and response to augmentation. Axillary Vein: No evidence of thrombus. Normal compressibility, respiratory phasicity and response to augmentation. Cephalic Vein: Partially occlusive thrombus noted in the distal upper arm. Basilic Vein: No evidence of thrombus. Normal compressibility, respiratory phasicity and response to augmentation. Brachial Veins: Small amount of nonocclusive thrombus noted in the right brachial vein around the PICC. Radial Veins: No evidence of thrombus. Normal compressibility, respiratory phasicity and response to augmentation. Ulnar Veins: No evidence of thrombus. Normal compressibility, respiratory phasicity and response to augmentation. Venous Reflux:  None visualized. Other Findings:  None visualized. IMPRESSION: 1. Small amount of nonocclusive thrombus noted in the distal upper arm cephalic vein. 2. Small amount of nonocclusive thrombus noted adjacent to PICC in the brachial vein in the mid upper arm. Electronically Signed   By: Acquanetta Belling M.D.   On: 03/17/2021 08:51        Scheduled Meds: . carvedilol  6.25 mg Oral BID WC  . Chlorhexidine Gluconate Cloth  6 each Topical Daily  . furosemide  80 mg Intravenous Q12H  . heparin  5,000 Units Intravenous Once  . melatonin  2.5 mg Oral QHS  . nicotine  14 mg Transdermal Daily  . sodium chloride flush  3 mL Intravenous Q12H   Continuous Infusions: . sodium chloride    . albumin human 25 g (03/18/21 1400)  . ampicillin (OMNIPEN) IV 2 g (03/18/21 0948)  . cefTRIAXone (ROCEPHIN)  IV 2 g (03/18/21 1103)  . heparin       LOS: 2 days    Time spent: 32 minutes    Marrion Coy, MD [facility]   To contact the attending provider between 7A-7P or the covering provider during after hours 7P-7A, please log into the web site [email or web address] and access using universal [place] password for that web site. If you do not have the password, please call the hospital  operator.  03/18/2021, 2:32 PM

## 2021-03-19 ENCOUNTER — Inpatient Hospital Stay (HOSPITAL_COMMUNITY)
Admission: AD | Admit: 2021-03-19 | Discharge: 2021-03-26 | DRG: 219 | Disposition: A | Discharge status: Home Health | Payer: Medicaid Other | Source: Other Acute Inpatient Hospital | Attending: Thoracic Surgery (Cardiothoracic Vascular Surgery) | Admitting: Thoracic Surgery (Cardiothoracic Vascular Surgery)

## 2021-03-19 ENCOUNTER — Inpatient Hospital Stay: Payer: Medicaid Other

## 2021-03-19 ENCOUNTER — Inpatient Hospital Stay: Admit: 2021-03-19 | Payer: Self-pay | Admitting: Internal Medicine

## 2021-03-19 DIAGNOSIS — J9811 Atelectasis: Secondary | ICD-10-CM

## 2021-03-19 DIAGNOSIS — Z4682 Encounter for fitting and adjustment of non-vascular catheter: Secondary | ICD-10-CM

## 2021-03-19 DIAGNOSIS — T82868A Thrombosis of vascular prosthetic devices, implants and grafts, initial encounter: Secondary | ICD-10-CM | POA: Diagnosis present

## 2021-03-19 DIAGNOSIS — K089 Disorder of teeth and supporting structures, unspecified: Secondary | ICD-10-CM

## 2021-03-19 DIAGNOSIS — D62 Acute posthemorrhagic anemia: Secondary | ICD-10-CM | POA: Diagnosis not present

## 2021-03-19 DIAGNOSIS — F17210 Nicotine dependence, cigarettes, uncomplicated: Secondary | ICD-10-CM | POA: Diagnosis present

## 2021-03-19 DIAGNOSIS — D735 Infarction of spleen: Secondary | ICD-10-CM | POA: Diagnosis present

## 2021-03-19 DIAGNOSIS — Z9889 Other specified postprocedural states: Secondary | ICD-10-CM

## 2021-03-19 DIAGNOSIS — I5033 Acute on chronic diastolic (congestive) heart failure: Secondary | ICD-10-CM | POA: Diagnosis present

## 2021-03-19 DIAGNOSIS — I82621 Acute embolism and thrombosis of deep veins of right upper extremity: Secondary | ICD-10-CM | POA: Diagnosis present

## 2021-03-19 DIAGNOSIS — M7989 Other specified soft tissue disorders: Secondary | ICD-10-CM | POA: Diagnosis not present

## 2021-03-19 DIAGNOSIS — D649 Anemia, unspecified: Secondary | ICD-10-CM | POA: Diagnosis present

## 2021-03-19 DIAGNOSIS — N048 Nephrotic syndrome with other morphologic changes: Secondary | ICD-10-CM | POA: Diagnosis present

## 2021-03-19 DIAGNOSIS — N179 Acute kidney failure, unspecified: Secondary | ICD-10-CM | POA: Diagnosis present

## 2021-03-19 DIAGNOSIS — Z09 Encounter for follow-up examination after completed treatment for conditions other than malignant neoplasm: Secondary | ICD-10-CM

## 2021-03-19 DIAGNOSIS — R000 Tachycardia, unspecified: Secondary | ICD-10-CM | POA: Diagnosis not present

## 2021-03-19 DIAGNOSIS — Y848 Other medical procedures as the cause of abnormal reaction of the patient, or of later complication, without mention of misadventure at the time of the procedure: Secondary | ICD-10-CM | POA: Diagnosis present

## 2021-03-19 DIAGNOSIS — B957 Other staphylococcus as the cause of diseases classified elsewhere: Secondary | ICD-10-CM | POA: Diagnosis present

## 2021-03-19 DIAGNOSIS — I511 Rupture of chordae tendineae, not elsewhere classified: Secondary | ICD-10-CM | POA: Diagnosis present

## 2021-03-19 DIAGNOSIS — Z79899 Other long term (current) drug therapy: Secondary | ICD-10-CM

## 2021-03-19 DIAGNOSIS — B952 Enterococcus as the cause of diseases classified elsewhere: Secondary | ICD-10-CM | POA: Diagnosis present

## 2021-03-19 DIAGNOSIS — N009 Acute nephritic syndrome with unspecified morphologic changes: Secondary | ICD-10-CM | POA: Diagnosis present

## 2021-03-19 DIAGNOSIS — F1290 Cannabis use, unspecified, uncomplicated: Secondary | ICD-10-CM | POA: Diagnosis present

## 2021-03-19 DIAGNOSIS — I330 Acute and subacute infective endocarditis: Secondary | ICD-10-CM | POA: Diagnosis present

## 2021-03-19 DIAGNOSIS — Z9689 Presence of other specified functional implants: Secondary | ICD-10-CM

## 2021-03-19 DIAGNOSIS — I340 Nonrheumatic mitral (valve) insufficiency: Secondary | ICD-10-CM | POA: Diagnosis present

## 2021-03-19 DIAGNOSIS — Z419 Encounter for procedure for purposes other than remedying health state, unspecified: Secondary | ICD-10-CM

## 2021-03-19 DIAGNOSIS — R21 Rash and other nonspecific skin eruption: Secondary | ICD-10-CM | POA: Diagnosis present

## 2021-03-19 DIAGNOSIS — K3189 Other diseases of stomach and duodenum: Secondary | ICD-10-CM | POA: Diagnosis not present

## 2021-03-19 DIAGNOSIS — I110 Hypertensive heart disease with heart failure: Secondary | ICD-10-CM | POA: Diagnosis not present

## 2021-03-19 DIAGNOSIS — I973 Postprocedural hypertension: Secondary | ICD-10-CM | POA: Diagnosis not present

## 2021-03-19 DIAGNOSIS — D509 Iron deficiency anemia, unspecified: Secondary | ICD-10-CM | POA: Diagnosis present

## 2021-03-19 DIAGNOSIS — N189 Chronic kidney disease, unspecified: Secondary | ICD-10-CM

## 2021-03-19 DIAGNOSIS — R011 Cardiac murmur, unspecified: Secondary | ICD-10-CM | POA: Diagnosis present

## 2021-03-19 DIAGNOSIS — Z862 Personal history of diseases of the blood and blood-forming organs and certain disorders involving the immune mechanism: Secondary | ICD-10-CM

## 2021-03-19 DIAGNOSIS — I130 Hypertensive heart and chronic kidney disease with heart failure and stage 1 through stage 4 chronic kidney disease, or unspecified chronic kidney disease: Secondary | ICD-10-CM | POA: Diagnosis present

## 2021-03-19 DIAGNOSIS — N059 Unspecified nephritic syndrome with unspecified morphologic changes: Secondary | ICD-10-CM

## 2021-03-19 DIAGNOSIS — I341 Nonrheumatic mitral (valve) prolapse: Secondary | ICD-10-CM | POA: Diagnosis present

## 2021-03-19 DIAGNOSIS — I509 Heart failure, unspecified: Secondary | ICD-10-CM

## 2021-03-19 DIAGNOSIS — I5031 Acute diastolic (congestive) heart failure: Secondary | ICD-10-CM | POA: Diagnosis present

## 2021-03-19 DIAGNOSIS — I76 Septic arterial embolism: Secondary | ICD-10-CM | POA: Diagnosis present

## 2021-03-19 LAB — BASIC METABOLIC PANEL
Anion gap: 8 (ref 5–15)
BUN: 49 mg/dL — ABNORMAL HIGH (ref 6–20)
CO2: 29 mmol/L (ref 22–32)
Calcium: 7.9 mg/dL — ABNORMAL LOW (ref 8.9–10.3)
Chloride: 102 mmol/L (ref 98–111)
Creatinine, Ser: 2.82 mg/dL — ABNORMAL HIGH (ref 0.61–1.24)
GFR, Estimated: 30 mL/min — ABNORMAL LOW (ref >60)
Glucose, Bld: 110 mg/dL — ABNORMAL HIGH (ref 70–99)
Potassium: 4.1 mmol/L (ref 3.5–5.1)
Sodium: 139 mmol/L (ref 135–145)

## 2021-03-19 LAB — HEMOGLOBIN AND HEMATOCRIT, BLOOD
HCT: 27 % — ABNORMAL LOW (ref 39.0–52.0)
Hemoglobin: 8.7 g/dL — ABNORMAL LOW (ref 13.0–17.0)

## 2021-03-19 LAB — HEPARIN LEVEL (UNFRACTIONATED)
Heparin Unfractionated: <0.1 IU/mL — ABNORMAL LOW (ref 0.30–0.70)
Heparin Unfractionated: <0.1 IU/mL — ABNORMAL LOW (ref 0.30–0.70)

## 2021-03-19 LAB — ABO/RH: ABO/RH(D): A POS

## 2021-03-19 LAB — PREPARE RBC (CROSSMATCH)

## 2021-03-19 LAB — CBC
HCT: 20.8 % — ABNORMAL LOW (ref 39.0–52.0)
Hemoglobin: 6.6 g/dL — ABNORMAL LOW (ref 13.0–17.0)
MCH: 27.7 pg (ref 26.0–34.0)
MCHC: 31.7 g/dL (ref 30.0–36.0)
MCV: 87.4 fL (ref 80.0–100.0)
Platelets: 222 10*3/uL (ref 150–400)
RBC: 2.38 MIL/uL — ABNORMAL LOW (ref 4.22–5.81)
RDW: 15 % (ref 11.5–15.5)
WBC: 5.8 10*3/uL (ref 4.0–10.5)
nRBC: 0 % (ref 0.0–0.2)

## 2021-03-19 LAB — MAGNESIUM: Magnesium: 1.9 mg/dL (ref 1.7–2.4)

## 2021-03-19 MED ORDER — HEPARIN (PORCINE) 25000 UT/250ML-% IV SOLN: 1800.0000 [IU]/h | INTRAVENOUS | Status: DC

## 2021-03-19 MED ORDER — TORSEMIDE 100 MG PO TABS
100.0000 mg | ORAL_TABLET | Freq: Every day | ORAL | Status: DC
  Administered 2021-03-20: 100 mg via ORAL
  Filled 2021-03-19: qty 1

## 2021-03-19 MED ORDER — HEPARIN (PORCINE) 25000 UT/250ML-% IV SOLN
1800.0000 [IU]/h | INTRAVENOUS | Status: DC
  Administered 2021-03-19: 1800 [IU]/h via INTRAVENOUS
  Filled 2021-03-19: qty 250

## 2021-03-19 MED ORDER — HEPARIN (PORCINE) 25000 UT/250ML-% IV SOLN
2300.0000 [IU]/h | INTRAVENOUS | Status: DC
  Administered 2021-03-19: 2050 [IU]/h via INTRAVENOUS
  Filled 2021-03-19: qty 250

## 2021-03-19 MED ORDER — TORSEMIDE 100 MG PO TABS: 100.0000 mg | ORAL_TABLET | Freq: Every day | ORAL | Status: DC

## 2021-03-19 MED ORDER — SODIUM CHLORIDE 0.9 % IV SOLN
2.0000 g | Freq: Two times a day (BID) | INTRAVENOUS | Status: DC
  Administered 2021-03-19 – 2021-03-26 (×13): 2 g via INTRAVENOUS
  Filled 2021-03-19: qty 2
  Filled 2021-03-19 (×7): qty 20
  Filled 2021-03-19: qty 2
  Filled 2021-03-19 (×2): qty 20
  Filled 2021-03-19 (×2): qty 2
  Filled 2021-03-19: qty 20

## 2021-03-19 MED ORDER — SODIUM CHLORIDE 0.9 % IV SOLN
2.0000 g | Freq: Four times a day (QID) | INTRAVENOUS | Status: DC
  Administered 2021-03-19 – 2021-03-22 (×9): 2 g via INTRAVENOUS
  Filled 2021-03-19: qty 2
  Filled 2021-03-19 (×2): qty 2000
  Filled 2021-03-19 (×3): qty 2
  Filled 2021-03-19: qty 2000
  Filled 2021-03-19: qty 2
  Filled 2021-03-19: qty 2000
  Filled 2021-03-19 (×2): qty 2
  Filled 2021-03-19: qty 2000
  Filled 2021-03-19: qty 2
  Filled 2021-03-19 (×2): qty 2000

## 2021-03-19 MED ORDER — CARVEDILOL 6.25 MG PO TABS
6.2500 mg | ORAL_TABLET | Freq: Two times a day (BID) | ORAL | Status: DC
  Administered 2021-03-20 (×2): 6.25 mg via ORAL
  Filled 2021-03-19 (×2): qty 1

## 2021-03-19 MED ORDER — ACETAMINOPHEN 650 MG RE SUPP: 650.0000 mg | Freq: Four times a day (QID) | RECTAL | Status: DC | PRN

## 2021-03-19 MED ORDER — ONDANSETRON HCL 4 MG/2ML IJ SOLN
4.0000 mg | Freq: Four times a day (QID) | INTRAMUSCULAR | Status: DC | PRN
  Administered 2021-03-19 – 2021-03-20 (×2): 4 mg via INTRAVENOUS
  Filled 2021-03-19 (×2): qty 2

## 2021-03-19 MED ORDER — METHYLPREDNISOLONE SODIUM SUCC 125 MG IJ SOLR
125.0000 mg | INTRAMUSCULAR | 0 refills | Status: DC
Start: 2021-03-20 — End: 2021-03-26

## 2021-03-19 MED ORDER — NICOTINE 14 MG/24HR TD PT24
14.0000 mg | MEDICATED_PATCH | Freq: Every day | TRANSDERMAL | Status: DC
  Administered 2021-03-19 – 2021-03-20 (×2): 14 mg via TRANSDERMAL
  Filled 2021-03-19 (×2): qty 1

## 2021-03-19 MED ORDER — HEPARIN BOLUS VIA INFUSION
2000.0000 [IU] | Freq: Once | INTRAVENOUS | Status: AC
  Administered 2021-03-19: 2000 [IU] via INTRAVENOUS
  Filled 2021-03-19: qty 2000

## 2021-03-19 MED ORDER — NICOTINE 14 MG/24HR TD PT24: 14.0000 mg | MEDICATED_PATCH | Freq: Every day | TRANSDERMAL | 0 refills | Status: AC

## 2021-03-19 MED ORDER — SODIUM CHLORIDE 0.9% IV SOLUTION
Freq: Once | INTRAVENOUS | Status: AC
Start: 2021-03-19 — End: 2021-03-19

## 2021-03-19 MED ORDER — SENNOSIDES-DOCUSATE SODIUM 8.6-50 MG PO TABS
1.0000 | ORAL_TABLET | Freq: Every evening | ORAL | Status: DC | PRN
  Administered 2021-03-20: 1 via ORAL
  Filled 2021-03-19: qty 1

## 2021-03-19 MED ORDER — TRAZODONE HCL 50 MG PO TABS
25.0000 mg | ORAL_TABLET | Freq: Every evening | ORAL | Status: DC | PRN
  Administered 2021-03-19: 25 mg via ORAL
  Filled 2021-03-19: qty 1

## 2021-03-19 MED ORDER — METHYLPREDNISOLONE SODIUM SUCC 125 MG IJ SOLR
125.0000 mg | INTRAMUSCULAR | Status: DC
  Administered 2021-03-20: 125 mg via INTRAVENOUS
  Filled 2021-03-19: qty 2

## 2021-03-19 MED ORDER — ACETAMINOPHEN 325 MG PO TABS
650.0000 mg | ORAL_TABLET | Freq: Four times a day (QID) | ORAL | Status: DC | PRN
  Administered 2021-03-19: 650 mg via ORAL
  Filled 2021-03-19: qty 2

## 2021-03-19 MED ORDER — SODIUM CHLORIDE 0.9 % IV SOLN
300.0000 mg | Freq: Once | INTRAVENOUS | Status: AC
  Administered 2021-03-19: 300 mg via INTRAVENOUS
  Filled 2021-03-19: qty 15

## 2021-03-19 MED ORDER — SODIUM CHLORIDE 0.9% FLUSH
3.0000 mL | Freq: Two times a day (BID) | INTRAVENOUS | Status: DC
  Administered 2021-03-19: 3 mL via INTRAVENOUS

## 2021-03-19 MED ORDER — CARVEDILOL 6.25 MG PO TABS: 6.2500 mg | ORAL_TABLET | Freq: Two times a day (BID) | ORAL | Status: DC

## 2021-03-19 MED ORDER — ONDANSETRON HCL 4 MG PO TABS
4.0000 mg | ORAL_TABLET | Freq: Four times a day (QID) | ORAL | Status: DC | PRN
  Administered 2021-03-20: 4 mg via ORAL
  Filled 2021-03-19: qty 1

## 2021-03-19 MED ORDER — TRAZODONE HCL 50 MG PO TABS
25.0000 mg | ORAL_TABLET | Freq: Every evening | ORAL | Status: AC | PRN
Start: 2021-03-19 — End: ?

## 2021-03-19 NOTE — Progress Notes (Signed)
Progress Note  Patient Name: Jerome Mccormick Date of Encounter: 03/19/2021  [facility] Cardiologist: Debbe OdeaBrian Agbor-Etang, MD   Subjective   Feeling much better today with significant improvement in leg edema from admission.  No chest pain or shortness of breath.  No bleeding noted.  Inpatient Medications    Scheduled Meds: . carvedilol  6.25 mg Oral BID WC  . Chlorhexidine Gluconate Cloth  6 each Topical Daily  . melatonin  2.5 mg Oral QHS  . methylPREDNISolone (SOLU-MEDROL) injection  125 mg Intravenous Q24H  . nicotine  14 mg Transdermal Daily  . sodium chloride flush  3 mL Intravenous Q12H  . torsemide  100 mg Oral Daily   Continuous Infusions: . sodium chloride Stopped (03/18/21 1813)  . ampicillin (OMNIPEN) IV Stopped (03/19/21 0511)  . cefTRIAXone (ROCEPHIN)  IV 200 mL/hr at 03/19/21 0630  . heparin 1,800 Units/hr (03/19/21 0721)   PRN Meds: sodium chloride, acetaminophen, butalbital-acetaminophen-caffeine, morphine injection, ondansetron (ZOFRAN) IV, sodium chloride flush, traZODone   Vital Signs    Vitals:   03/18/21 2019 03/19/21 0458 03/19/21 0500 03/19/21 0728  BP: (!) 137/95 (!) 131/95  (!) 131/101  Pulse: 80 79  74  Resp: 16 16  18   Temp: 98.8 F (37.1 C) 98 F (36.7 C)  97.6 F (36.4 C)  TempSrc: Oral Oral    SpO2: 100% 97%  100%  Weight:  79 kg 81.2 kg   Height:        Intake/Output Summary (Last 24 hours) at 03/19/2021 1214 Last data filed at 03/19/2021 1024 Gross per 24 hour  Intake 3767.63 ml  Output 4425 ml  Net -657.37 ml   Last 3 Weights 03/19/2021 03/19/2021 03/17/2021  Weight (lbs) 179 lb 0.2 oz 174 lb 1.6 oz 179 lb 12.8 oz  Weight (kg) 81.2 kg 78.971 kg 81.557 kg      Telemetry    Sinus rhythm- Personally Reviewed  ECG    No new trace  Physical Exam   GEN: No acute distress.   Neck: No JVD Cardiac:  Regular rate and rhythm with 3/6 systolic murmur. Respiratory:  Mildly diminished breath sounds at the lung bases. GI:  Soft, nontender, non-distended  MS:  Trace to 1+ pretibial edema bilaterally; No deformity. Neuro:  Nonfocal  Psych: Normal affect   Labs    High Sensitivity Troponin:   Recent Labs  Lab 03/16/21 0658  TROPONINIHS 8      Chemistry Recent Labs  Lab 03/16/21 0658 03/17/21 0550 03/18/21 0530 03/19/21 0530  NA 138 138 140 139  K 5.7* 5.3* 4.7 4.1  CL 107 102 104 102  CO2 23 25 26 29   GLUCOSE 97 95 97 110*  BUN 69* 64* 56* 49*  CREATININE 3.26* 3.32* 3.13* 2.82*  CALCIUM 8.9 8.4* 8.2* 7.9*  PROT 6.3*  --   --   --   ALBUMIN 2.6*  --   --   --   AST 27  --   --   --   ALT 23  --   --   --   ALKPHOS 33*  --   --   --   BILITOT 0.5  --   --   --   GFRNONAA 25* 25* 26* 30*  ANIONGAP 8 11 10 8      Hematology Recent Labs  Lab 03/16/21 0658 03/19/21 0530  WBC 8.0 5.8  RBC 3.13* 2.38*  HGB 8.6* 6.6*  HCT 27.9* 20.8*  MCV 89.1 87.4  MCH  27.5 27.7  MCHC 30.8 31.7  RDW 14.9 15.0  PLT 218 222    BNP Recent Labs  Lab 03/16/21 0658  BNP 570.3*     DDimer No results for input(s): DDIMER in the last 168 hours.   Radiology    ECHOCARDIOGRAM COMPLETE  Result Date: 03/18/2021    ECHOCARDIOGRAM REPORT   Patient Name:   TAKUYA LARICCIA Date of Exam: 03/18/2021 Medical Rec #:  [id number]      Height:       67.0 in Accession #:    [id number]     Weight:       179.8 lb Date of Birth:  1991-06-01      BSA:          1.933 m Patient Age:    30 years       BP:           130/92 mmHg Patient Gender: M              HR:           78 bpm. Exam Location:  [facility] Procedure: 2D Echo, Color Doppler and Cardiac Doppler Indications:     I34.0 Mitral valve insufficiency  History:         Patient has prior history of Echocardiogram examinations, most                  recent 03/06/2021. Mitral Valve Disease.  Sonographer:     Humphrey Rolls RDCS (AE) Referring Phys:  [id number] Lennon Alstrom Diagnosing Phys: Lorine Bears MD IMPRESSIONS  1. Left ventricular ejection fraction, by estimation, is 60 to  65%. The left ventricle has normal function. The left ventricle has no regional wall motion abnormalities. The left ventricular internal cavity size was mildly dilated. Left ventricular diastolic parameters are indeterminate.  2. Right ventricular systolic function is normal. The right ventricular size is normal. There is mildly elevated pulmonary artery systolic pressure. The estimated right ventricular systolic pressure is 44.2 mmHg.  3. Left atrial size was mildly dilated.  4. The pericardial effusion is circumferential.  5. The mitral valve is abnormal. 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 Moderate to severe mitral valve regurgitation. No evidence of mitral stenosis. 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  6. The aortic valve is normal in structure. Aortic valve regurgitation is not visualized. No aortic stenosis is present.  7. The inferior vena cava is normal in size with greater than 50% respiratory variability, suggesting right atrial pressure of 3 mmHg. FINDINGS  Left Ventricle: Left ventricular ejection fraction, by estimation, is 60 to 65%. The left ventricle has normal function. The left ventricle has no regional wall motion abnormalities. The left ventricular internal cavity size was mildly dilated. There is  no left ventricular hypertrophy. Left ventricular diastolic parameters are indeterminate. Right Ventricle: The right ventricular size is normal. No increase in right ventricular wall thickness. Right ventricular systolic function is normal. There is mildly elevated pulmonary artery systolic pressure. The tricuspid regurgitant velocity is 3.21  m/s, and with an assumed right atrial pressure of 3 mmHg, the estimated right ventricular systolic pressure is 44.2 mmHg. Left Atrium: Left atrial size was mildly dilated. Right Atrium: Right atrial size was normal in size. Pericardium: Trivial pericardial effusion is present. The pericardial effusion is  circumferential. Mitral Valve: The mitral valve is abnormal. 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 There is moderate thickening  of the mitral valve leaflet(s). Moderate to severe mitral valve regurgitation. No evidence of mitral valve stenosis. MV peak gradient, 6.2 mmHg. The mean mitral valve gradient is 4.0 mmHg. Tricuspid Valve: The tricuspid valve is normal in structure. Tricuspid valve regurgitation is mild . No evidence of tricuspid stenosis. Aortic Valve: The aortic valve is normal in structure. Aortic valve regurgitation is not visualized. No aortic stenosis is present. Aortic valve mean gradient measures 4.0 mmHg. Aortic valve peak gradient measures 8.0 mmHg. Aortic valve area, by VTI measures 3.03 cm. Pulmonic Valve: The pulmonic valve was normal in structure. Pulmonic valve regurgitation is mild. No evidence of pulmonic stenosis. Aorta: The aortic root is normal in size and structure. Venous: The inferior vena cava is normal in size with greater than 50% respiratory variability, suggesting right atrial pressure of 3 mmHg. IAS/Shunts: No atrial level shunt detected by color flow Doppler.  LEFT VENTRICLE PLAX 2D LVIDd:         5.30 cm  Diastology LVIDs:         3.50 cm  LV e' medial:    9.46 cm/s LV PW:         1.00 cm  LV E/e' medial:  14.3 LV IVS:        0.80 cm  LV e' lateral:   16.80 cm/s LVOT diam:     2.10 cm  LV E/e' lateral: 8.0 LV SV:         83 LV SV Index:   43 LVOT Area:     3.46 cm  RIGHT VENTRICLE RV Basal diam:  2.80 cm TAPSE (M-mode): 3.4 cm LEFT ATRIUM             Index       RIGHT ATRIUM           Index LA diam:        4.00 cm 2.07 cm/m  RA Area:     15.10 cm LA Vol (A2C):   55.5 ml 28.72 ml/m RA Volume:   31.80 ml  16.45 ml/m LA Vol (A4C):   73.5 ml 38.03 ml/m LA Biplane Vol: 63.9 ml 33.06 ml/m  AORTIC VALVE                   PULMONIC VALVE AV Area (Vmax):    2.95 cm    PV Vmax:       0.85 m/s AV Area (Vmean):   2.94 cm    PV Vmean:       58.000 cm/s AV Area (VTI):     3.03 cm    PV VTI:        0.204 m AV Vmax:           141.00 cm/s PV Peak grad:  2.9 mmHg AV Vmean:          99.400 cm/s PV Mean grad:  1.0 mmHg AV VTI:            0.273 m AV Peak Grad:      8.0 mmHg AV Mean Grad:      4.0 mmHg LVOT Vmax:         120.00 cm/s LVOT Vmean:        84.300 cm/s LVOT VTI:          0.239 m LVOT/AV VTI ratio: 0.88  AORTA Ao Root diam: 3.30 cm MITRAL VALVE                TRICUSPID VALVE MV Area (PHT): 4.29  cm     TR Peak grad:   41.2 mmHg MV Area VTI:   2.71 cm     TR Vmax:        321.00 cm/s MV Peak grad:  6.2 mmHg MV Mean grad:  4.0 mmHg     SHUNTS MV Vmax:       1.25 m/s     Systemic VTI:  0.24 m MV Vmean:      90.6 cm/s    Systemic Diam: 2.10 cm MV Decel Time: 177 msec MV E velocity: 135.00 cm/s MV A velocity: 95.40 cm/s MV E/A ratio:  1.42 Lorine Bears MD Electronically signed by Lorine Bears MD Signature Date/Time: 03/18/2021/2:50:06 PM    Final     Cardiac Studies   TTE (03/18/2021): 1. Left ventricular ejection fraction, by estimation, is 60 to 65%. The  left ventricle has normal function. The left ventricle has no regional  wall motion abnormalities. The left ventricular internal cavity size was  mildly dilated. Left ventricular  diastolic parameters are indeterminate.  2. Right ventricular systolic function is normal. The right ventricular  size is normal. There is mildly elevated pulmonary artery systolic  pressure. The estimated right ventricular systolic pressure is 44.2 mmHg.  3. Left atrial size was mildly dilated.  4. The pericardial effusion is circumferential.  5. The mitral valve is abnormal. There is evidence of a ruptured  posterior chordae. Moderate to severe mitral valve regurgitation. No  evidence of mitral stenosis. There is mild holosystolic prolapse of both  leaflets of the mitral valve.  6. The aortic valve is normal in structure. Aortic valve regurgitation is  not visualized. No aortic stenosis is  present.  7. The inferior vena cava is normal in size with greater than 50%  respiratory variability, suggesting right atrial pressure of 3 mmHg.   Patient Profile     30 y.o. male with recently diagnosed acute kidney injury and moderate to severe mitral regurgitation with flail leaflet complicated by bacteremia and concern for SBE, admitted with worsening heart failure.  Assessment & Plan    Acute on chronic HFpEF and mitral regurgitation with possible endocarditis: Volume status improving.  Patient asymptomatic except for mild swelling in the calves.  He believes that dietary indiscretion after his last hospitalization led to fluid retention.  Continue torsemide 100 mg p.o. daily.  Patient scheduled for outpatient cardiac surgery consultation tomorrow.  Given PICC associated upper extremity DVT and worsening anemia that will likely require PRBC transfusion today, it seems unlikely that he will be able to make his cardiac surgery appointment.  Given his multiple acute and chronic medical issues, I think it would be reasonable to consider transfer to [facility] for further medical optimization as well as cardiac surgery consultation there to discuss mitral valve repair.  Continue antibiotics per IM.  Continue carvedilol.  Acute kidney injury with nephrotic syndrome: Renal function gradually improving.  Continue torsemide.  Avoid nephrotoxic drugs.  Ongoing follow-up with nephrology.  Acute on chronic anemia: Hemoglobin down to 6.6 this morning.  No overt bleeding noted.  Agree with IM plans to perform unenhanced CT of the abdomen and pelvis to exclude retroperitoneal bleed given kidney biopsy during last admission.  Further work-up of possible acute bleeding per IM.  Agree with PRBC transfusion in the setting of hemoglobin less than 7.  PICC associated upper extremity DVT: No symptoms reported by the patient.  Patient currently on heparin though anemia noted as  above.  Ongoing management per IM.  Risks and benefits of continued anticoagulation need to be weighed in the setting of worsening anemia.  For questions or updates, please contact [facility] Please consult [email or web address] for contact info under [facility].     Signed, Yvonne Kendall, MD  03/19/2021, 12:14 PM

## 2021-03-19 NOTE — Progress Notes (Addendum)
ANTICOAGULATION CONSULT NOTE  Pharmacy Consult for heparin infusion Indication: DVT  No Known Allergies  Patient Measurements: Height: 5\' 7"  (170.2 cm) Weight: 81.2 kg (179 lb 0.2 oz) IBW/kg (Calculated) : 66.1  Vital Signs: Temp: 98 F (36.7 C) (04/19 0458) Temp Source: Oral (04/19 0458) BP: 131/95 (04/19 0458) Pulse Rate: 79 (04/19 0458)  Labs: Recent Labs    03/17/21 0550 03/18/21 0530 03/18/21 2225 03/19/21 0530  HGB  --   --   --  6.6*  HCT  --   --   --  20.8*  PLT  --   --   --  222  HEPARINUNFRC  --   --  <0.10* <0.10*  CREATININE 3.32* 3.13*  --  2.82*    Estimated Creatinine Clearance: 39.1 mL/min (A) (by C-G formula based on SCr of 2.82 mg/dL (H)).   Medical History: Past Medical History:  Diagnosis Date  . Allergy   . Headache   . Postinfectious glomerulonephritis     Medications:  Scheduled:  . carvedilol  6.25 mg Oral BID WC  . Chlorhexidine Gluconate Cloth  6 each Topical Daily  . heparin  2,000 Units Intravenous Once  . melatonin  2.5 mg Oral QHS  . methylPREDNISolone (SOLU-MEDROL) injection  125 mg Intravenous Q24H  . nicotine  14 mg Transdermal Daily  . sodium chloride flush  3 mL Intravenous Q12H  . torsemide  100 mg Oral Daily    Assessment: 30 y.o. 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 An ultrasound was performed on 4/17 which revealed a small amount of nonocclusive thrombus noted in the distal upper arm cephalic vein and a small amount of nonocclusive thrombus noted adjacent to PICC in the brachial vein in the mid upper arm. Pharmacy has been asked to start start heparin. Prior to this order he was on prophylactic LMWH with the last dose of 40 mg administered at 2221 on 4/18  Goal of Therapy:   Heparin level 0.3-0.7 units/ml Monitor platelets by anticoagulation protocol: Yes   4/18 2225 HL <0.1 4/19 0530 HL <0.1, Hgb 6.6 (down from 8.6 4/16)  Plan:   Give 2000 units bolus x 1  Increase heparin infusion to 1800 units/hr  Recheck HL 6 hours and daily while on heparin  Continue to monitor H&H and platelets  5/16, PharmD, [facility] 03/19/2021 7:04 AM\

## 2021-03-19 NOTE — Progress Notes (Signed)
[facility]  ROUNDING NOTE   Subjective:   Mr. Jerome Mccormick was admitted to [facility] on 03/16/2021 for Acute kidney failure, unspecified (HCC) [N17.9] Anasarca [R60.1] Generalized edema [R60.1] Scrotal pain [N50.82] Acute diastolic CHF (congestive heart failure) (HCC) [I50.31]  Patient sitting up in bed Alert and oriented States he feels much better today Appetite is improving   Objective:  Vital signs in last 24 hours:  Temp:  [97.6 F (36.4 C)-98.8 F (37.1 C)] 97.6 F (36.4 C) (04/19 0728) Pulse Rate:  [74-88] 74 (04/19 0728) Resp:  [16-20] 18 (04/19 0728) BP: (131-137)/(90-101) 131/101 (04/19 0728) SpO2:  [97 %-100 %] 100 % (04/19 0728) Weight:  [79 kg-81.2 kg] 81.2 kg (04/19 0500)  Weight change: -2.586 kg Filed Weights   03/17/21 0900 03/19/21 0458 03/19/21 0500  Weight: 81.6 kg 79 kg 81.2 kg    Intake/Output: I/O last 3 completed shifts: In: 4214.1 [P.O.:2450; I.V.:114.2; IV Piggyback:1649.9] Out: 5175 [Urine:5175]   Intake/Output this shift:  Total I/O In: 213.5 [P.O.:180; I.V.:33.5] Out: 350 [Urine:350]  Physical Exam: General: NAD, sitting up in bed  Head: Normocephalic, atraumatic. Moist oral mucosal membranes  Eyes: Anicteric  Lungs:  Clear bilaterally, normal breathing effort  Heart: Regular rate and rhythm  Abdomen:  Soft, nontender,   Extremities: + peripheral edema.  Neurologic: Nonfocal, moving all four extremities  Skin: No lesions  Access: none    Basic Metabolic Panel: Recent Labs  Lab 03/16/21 0658 03/17/21 0550 03/18/21 0530 03/19/21 0530  NA 138 138 140 139  K 5.7* 5.3* 4.7 4.1  CL 107 102 104 102  CO2 GLUCOSE 97 95 97 110*  BUN 69* 64* 56* 49*  CREATININE 3.26* 3.32* 3.13* 2.82*  CALCIUM 8.9 8.4* 8.2* 7.9*  MG  --   --   --  1.9    Liver Function Tests: Recent Labs  Lab 03/16/21 0658  AST 27  ALT 23  ALKPHOS 33*  BILITOT 0.5  PROT 6.3*  ALBUMIN 2.6*   No results for input(s): LIPASE,  AMYLASE in the last 168 hours. No results for input(s): AMMONIA in the last 168 hours.  CBC: Recent Labs  Lab 03/16/21 0658 03/19/21 0530  WBC 8.0 5.8  NEUTROABS 6.0  --   HGB 8.6* 6.6*  HCT 27.9* 20.8*  MCV 89.1 87.4  PLT 218 222    Cardiac Enzymes: No results for input(s): CKTOTAL, CKMB, CKMBINDEX, TROPONINI in the last 168 hours.  BNP: Invalid input(s): POCBNP  CBG: No results for input(s): GLUCAP in the last 168 hours.  Microbiology: Results for orders placed or performed during the hospital encounter of 03/16/21  Resp Panel by RT-PCR (Flu A&B, Covid) Nasopharyngeal Swab     Status: None   Collection Time: 03/16/21  7:02 AM   Specimen: Nasopharyngeal Swab; Nasopharyngeal(NP) swabs in vial transport medium  Result Value Ref Range Status   SARS Coronavirus 2 by RT PCR NEGATIVE NEGATIVE Final    Comment: (NOTE) SARS-CoV-2 target nucleic acids are NOT DETECTED.  The SARS-CoV-2 RNA is generally detectable in upper respiratory specimens during the acute phase of infection. The lowest concentration of SARS-CoV-2 viral copies this assay can detect is 138 copies/mL. A negative result does not preclude SARS-Cov-2 infection and should not be used as the sole basis for treatment or other patient management decisions. A negative result may occur with  improper specimen collection/handling, submission of specimen other than nasopharyngeal swab, presence of viral mutation(s) within the areas targeted  by this assay, and inadequate number of viral copies(<138 copies/mL). A negative result must be combined with clinical observations, patient history, and epidemiological information. The expected result is Negative.  Fact Sheet for Patients:  BloggerCourse.com  Fact Sheet for Healthcare Providers:  SeriousBroker.it  This test is no t yet approved or cleared by the [place] FDA and  has been authorized for detection and/or  diagnosis of SARS-CoV-2 by FDA under an Emergency Use Authorization (EUA). This EUA will remain  in effect (meaning this test can be used) for the duration of the COVID-19 declaration under Section 564(b)(1) of the Act, 21 U.S.C.section 360bbb-3(b)(1), unless the authorization is terminated  or revoked sooner.       Influenza A by PCR NEGATIVE NEGATIVE Final   Influenza B by PCR NEGATIVE NEGATIVE Final    Comment: (NOTE) The Xpert Xpress SARS-CoV-2/FLU/RSV plus assay is intended as an aid in the diagnosis of influenza from Nasopharyngeal swab specimens and should not be used as a sole basis for treatment. Nasal washings and aspirates are unacceptable for Xpert Xpress SARS-CoV-2/FLU/RSV testing.  Fact Sheet for Patients: BloggerCourse.com  Fact Sheet for Healthcare Providers: SeriousBroker.it  This test is not yet approved or cleared by the [place] FDA and has been authorized for detection and/or diagnosis of SARS-CoV-2 by FDA under an Emergency Use Authorization (EUA). This EUA will remain in effect (meaning this test can be used) for the duration of the COVID-19 declaration under Section 564(b)(1) of the Act, 21 U.S.C. section 360bbb-3(b)(1), unless the authorization is terminated or revoked.  Performed at [facility], [address] [place], [place] [address]   Culture, blood (routine x 2)     Status: None (Preliminary result)   Collection Time: 03/16/21  2:07 PM   Specimen: BLOOD  Result Value Ref Range Status   Specimen Description BLOOD BLOOD LEFT ARM  Final   Special Requests   Final    BOTTLES DRAWN AEROBIC AND ANAEROBIC Blood Culture adequate volume   Culture   Final    NO GROWTH 3 DAYS Performed at [facility], [address], [place], [place] [address]    Report Status PENDING  Incomplete  Culture, blood (routine x 2)     Status: None (Preliminary result)   Collection Time: 03/16/21   2:07 PM   Specimen: BLOOD  Result Value Ref Range Status   Specimen Description BLOOD BLOOD LEFT HAND  Final   Special Requests   Final    BOTTLES DRAWN AEROBIC AND ANAEROBIC Blood Culture adequate volume   Culture   Final    NO GROWTH 3 DAYS Performed at [facility], [address] [place], [place] [address]    Report Status PENDING  Incomplete    Coagulation Studies: No results for input(s): LABPROT, INR in the last 72 hours.  Urinalysis: No results for input(s): COLORURINE, LABSPEC, PHURINE, GLUCOSEU, HGBUR, BILIRUBINUR, KETONESUR, PROTEINUR, UROBILINOGEN, NITRITE, LEUKOCYTESUR in the last 72 hours.  Invalid input(s): APPERANCEUR    Imaging: ECHOCARDIOGRAM COMPLETE  Result Date: 03/18/2021    ECHOCARDIOGRAM REPORT   Patient Name:   KHALID LACKO Date of Exam: 03/18/2021 Medical Rec #:  [id number]      Height:       67.0 in Accession #:    [id number]     Weight:       179.8 lb Date of Birth:  July 15, 1991      BSA:          1.933 m  Patient Age:    30 years       BP:           130/92 mmHg Patient Gender: M              HR:           78 bpm. Exam Location:  [facility] Procedure: 2D Echo, Color Doppler and Cardiac Doppler Indications:     I34.0 Mitral valve insufficiency  History:         Patient has prior history of Echocardiogram examinations, most                  recent 03/06/2021. Mitral Valve Disease.  Sonographer:     Humphrey Rolls RDCS (AE) Referring Phys:  [id number] Lennon Alstrom Diagnosing Phys: Lorine Bears MD IMPRESSIONS  1. Left ventricular ejection fraction, by estimation, is 60 to 65%. The left ventricle has normal function. The left ventricle has no regional wall motion abnormalities. The left ventricular internal cavity size was mildly dilated. Left ventricular diastolic parameters are indeterminate.  2. Right ventricular systolic function is normal. The right ventricular size is normal. There is mildly elevated pulmonary artery systolic pressure. The estimated right  ventricular systolic pressure is 44.2 mmHg.  3. Left atrial size was mildly dilated.  4. The pericardial effusion is circumferential.  5. The mitral valve is abnormal. 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 Moderate to severe mitral valve regurgitation. No evidence of mitral stenosis. 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  6. The aortic valve is normal in structure. Aortic valve regurgitation is not visualized. No aortic stenosis is present.  7. The inferior vena cava is normal in size with greater than 50% respiratory variability, suggesting right atrial pressure of 3 mmHg. FINDINGS  Left Ventricle: Left ventricular ejection fraction, by estimation, is 60 to 65%. The left ventricle has normal function. The left ventricle has no regional wall motion abnormalities. The left ventricular internal cavity size was mildly dilated. There is  no left ventricular hypertrophy. Left ventricular diastolic parameters are indeterminate. Right Ventricle: The right ventricular size is normal. No increase in right ventricular wall thickness. Right ventricular systolic function is normal. There is mildly elevated pulmonary artery systolic pressure. The tricuspid regurgitant velocity is 3.21  m/s, and with an assumed right atrial pressure of 3 mmHg, the estimated right ventricular systolic pressure is 44.2 mmHg. Left Atrium: Left atrial size was mildly dilated. Right Atrium: Right atrial size was normal in size. Pericardium: Trivial pericardial effusion is present. The pericardial effusion is circumferential. Mitral Valve: The mitral valve is abnormal. 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 There is moderate thickening of the mitral valve leaflet(s). Moderate to severe mitral valve regurgitation. No evidence of mitral valve stenosis. MV peak gradient, 6.2 mmHg. The mean mitral valve gradient is 4.0 mmHg. Tricuspid Valve: The tricuspid valve is normal in  structure. Tricuspid valve regurgitation is mild . No evidence of tricuspid stenosis. Aortic Valve: The aortic valve is normal in structure. Aortic valve regurgitation is not visualized. No aortic stenosis is present. Aortic valve mean gradient measures 4.0 mmHg. Aortic valve peak gradient measures 8.0 mmHg. Aortic valve area, by VTI measures 3.03 cm. Pulmonic Valve: The pulmonic valve was normal in structure. Pulmonic valve regurgitation is mild. No evidence of pulmonic stenosis. Aorta: The aortic root is normal in size and structure. Venous: The inferior vena cava is normal in size with greater  than 50% respiratory variability, suggesting right atrial pressure of 3 mmHg. IAS/Shunts: No atrial level shunt detected by color flow Doppler.  LEFT VENTRICLE PLAX 2D LVIDd:         5.30 cm  Diastology LVIDs:         3.50 cm  LV e' medial:    9.46 cm/s LV PW:         1.00 cm  LV E/e' medial:  14.3 LV IVS:        0.80 cm  LV e' lateral:   16.80 cm/s LVOT diam:     2.10 cm  LV E/e' lateral: 8.0 LV SV:         83 LV SV Index:   43 LVOT Area:     3.46 cm  RIGHT VENTRICLE RV Basal diam:  2.80 cm TAPSE (M-mode): 3.4 cm LEFT ATRIUM             Index       RIGHT ATRIUM           Index LA diam:        4.00 cm 2.07 cm/m  RA Area:     15.10 cm LA Vol (A2C):   55.5 ml 28.72 ml/m RA Volume:   31.80 ml  16.45 ml/m LA Vol (A4C):   73.5 ml 38.03 ml/m LA Biplane Vol: 63.9 ml 33.06 ml/m  AORTIC VALVE                   PULMONIC VALVE AV Area (Vmax):    2.95 cm    PV Vmax:       0.85 m/s AV Area (Vmean):   2.94 cm    PV Vmean:      58.000 cm/s AV Area (VTI):     3.03 cm    PV VTI:        0.204 m AV Vmax:           141.00 cm/s PV Peak grad:  2.9 mmHg AV Vmean:          99.400 cm/s PV Mean grad:  1.0 mmHg AV VTI:            0.273 m AV Peak Grad:      8.0 mmHg AV Mean Grad:      4.0 mmHg LVOT Vmax:         120.00 cm/s LVOT Vmean:        84.300 cm/s LVOT VTI:          0.239 m LVOT/AV VTI ratio: 0.88  AORTA Ao Root diam: 3.30 cm MITRAL  VALVE                TRICUSPID VALVE MV Area (PHT): 4.29 cm     TR Peak grad:   41.2 mmHg MV Area VTI:   2.71 cm     TR Vmax:        321.00 cm/s MV Peak grad:  6.2 mmHg MV Mean grad:  4.0 mmHg     SHUNTS MV Vmax:       1.25 m/s     Systemic VTI:  0.24 m MV Vmean:      90.6 cm/s    Systemic Diam: 2.10 cm MV Decel Time: 177 msec MV E velocity: 135.00 cm/s MV A velocity: 95.40 cm/s MV E/A ratio:  1.42 Lorine BearsMuhammad Arida MD Electronically signed by Lorine BearsMuhammad Arida MD Signature Date/Time: 03/18/2021/2:50:06 PM    Final      Medications:   .  sodium chloride Stopped (03/18/21 1813)  . ampicillin (OMNIPEN) IV 2 g (03/19/21 1234)  . cefTRIAXone (ROCEPHIN)  IV 200 mL/hr at 03/19/21 0630  . heparin 1,800 Units/hr (03/19/21 0721)   . carvedilol  6.25 mg Oral BID WC  . Chlorhexidine Gluconate Cloth  6 each Topical Daily  . melatonin  2.5 mg Oral QHS  . methylPREDNISolone (SOLU-MEDROL) injection  125 mg Intravenous Q24H  . nicotine  14 mg Transdermal Daily  . sodium chloride flush  3 mL Intravenous Q12H  . torsemide  100 mg Oral Daily   sodium chloride, acetaminophen, butalbital-acetaminophen-caffeine, morphine injection, ondansetron (ZOFRAN) IV, sodium chloride flush, traZODone  Assessment/ Plan:  Mr. TYHEEM BOUGHNER is a 30 y.o. white male with no past medical history with post infectious glomerulonephritis biopsy proven admitted to [facility] on 03/16/2021 for Acute kidney failure, unspecified (HCC) [N17.9] Anasarca [R60.1] Generalized edema [R60.1] Scrotal pain [N50.82] Acute diastolic CHF (congestive heart failure) (HCC) [I50.31]  Father was on dialysis. Patient is a tobacco and THC smoker. Had TCA in urine tox screen.   1. Acute kidney injury: post infectious glomerulonephritis biopsy on 03/04/21. Baseline creatinine of 0.96, normal GFR in 03/11/2014.  2. Anasarca 3. Nephrotic range proteinuria: 5.44g 4. Hematuria 5. Hypertension  Patient has failed outpatient diuretics. No indication for dialysis at  this time.  - Torsemide 100mg  daily.  - Continue IV antibiotics. Ampicillin and ceftriaxone as taken at home - persistent low complements.  - Symptoms can last for months. Will do literature search to see if systemic steroids will be of benefit for this patient.  - Appreciate cardiology and ID input.    LOS: 3   4/19/202212:43 PM

## 2021-03-19 NOTE — Plan of Care (Signed)
?  Problem: Coping: ?Goal: Level of anxiety will decrease ?Outcome: Progressing ?  ?Problem: Safety: ?Goal: Ability to remain free from injury will improve ?Outcome: Progressing ?  ?

## 2021-03-19 NOTE — Progress Notes (Addendum)
ANTICOAGULATION CONSULT NOTE  Pharmacy Consult for Heparin Indication: DVT  No Known Allergies  Patient Measurements: Height: 5\' 7"  (170.2 cm) Weight: 78.4 kg (172 lb 12.8 oz) IBW/kg (Calculated) : 66.1  Heparin Dosing Wt: 78.4 kg   Vital Signs: Temp: 98.4 F (36.9 C) (04/19 1922) Temp Source: Oral (04/19 1922) BP: 130/99 (04/19 1922) Pulse Rate: 68 (04/19 1922)  Labs: Recent Labs    03/17/21 0550 03/18/21 0530 03/18/21 2225 03/19/21 0530  HGB  --   --   --  6.6*  HCT  --   --   --  20.8*  PLT  --   --   --  222  HEPARINUNFRC  --   --  <0.10* <0.10*  CREATININE 3.32* 3.13*  --  2.82*    Estimated Creatinine Clearance: 35.8 mL/min (A) (by C-G formula based on SCr of 2.82 mg/dL (H)).   Medical History: Past Medical History:  Diagnosis Date  . Allergy   . Headache   . Postinfectious glomerulonephritis     Medications:  Scheduled:  . [START ON 03/20/2021] carvedilol  6.25 mg Oral BID WC  . [START ON 03/20/2021] methylPREDNISolone sodium succinate  125 mg Intravenous Q24H  . nicotine  14 mg Transdermal Daily  . sodium chloride flush  3 mL Intravenous Q12H  . 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 Pharmacy has been asked to start start heparin.   The patient was transferred from [facility] this evening with Heparin running at 1800 units/hr from a prior rate adjustment earlier today. No levels have been drawn at this new rate yet - will obtain a level now and make further adjustments as needed.   Goal of Therapy:  Heparin level 0.3-0.7 units/ml Monitor platelets by anticoagulation protocol: Yes   Plan:  - Continue Heparin at 1800 units/hr - continued  from transfer from [facility] - Will check a heparin level now and make further adjustments as needed - Will monitor signs/symptoms of bleeding, daily HL/CBC  Addendum: Heparin level on admission is still SUBtherapeutic (HL <0.1, goal of 0.3-0.7). No bleeding or issues noted per RN - was infusing upon transfer.   Plan - Heparin 2k bolus x 1 - Increase heparin to 2050 units/hr (20.5 ml/hr) - Will continue to monitor for any signs/symptoms of bleeding and will follow up with heparin level in 6 hours   Thank you for allowing pharmacy to be a part of this patient's care.  [facility], PharmD, BCPS Clinical Pharmacist Clinical phone for 03/19/2021: 03/21/2021 03/19/2021 7:52 PM   **Pharmacist phone directory can now be found on [email or web address] ([email or web address]).  Listed under [facility].  [phone]

## 2021-03-19 NOTE — Progress Notes (Signed)
Report called to [facility] [place] Spoke with RN Cathie Olden. Updated on plan of care Also informed RN that patient would need to have a repeat H/H due to blood transfusion just completing.  Time allowed for questions and concerns  Verbalizes an understanding on plan of care and repeat lab work.  Patient and mother updated on transfer.  Verbalizes an agreement to transfer.  Awaiting medical transport to [facility]

## 2021-03-19 NOTE — Discharge Summary (Addendum)
Physician Discharge Summary  Patient ID: Jerome Mccormick MRN: [id number] DOB/AGE: 02-12-1991 30 y.o.  Admit date: 03/16/2021 Discharge date: 03/19/2021  Admission Diagnoses:  Discharge Diagnoses:  Principal Problem:   Acute diastolic CHF (congestive heart failure) (HCC) Active Problems:   Nonrheumatic mitral valve regurgitation   History of anemia due to chronic kidney disease   Nicotine dependence   Hyperkalemia   Acute glomerulonephritis syndrome   Bacterial endocarditis   Discharged Condition: good  Hospital Course:  Jerome Mccormick a 30 y.o.malewith medical history significant forrecent diagnosis of enterococcal endocarditis discharged home on IV antibiotics(Rocephin 2 g IV every 12 and ampicillin 8 g IV daily for 6 weeks,last dose05/17/22),history of migraine headaches as well as postinfectious glomerulonephritis whowas discharged from the hospital 5 days prior to his presentation today with complaints of worsening lower extremity swelling. Patient was found to have worsening renal function, chest x-ray showed diffusely increased pulmonary interstitial opacity, consistent with interstitial edema. Echocardiogram showed mitral prolapse with ruptured posterior chordae. Repeated echocardiogram on 4/18 showed moderate MR, discussed with Dr. Okey Dupre, prefer to transfer to [facility] for CT surgery evaluation as inpatient. Patient also developed right arm DVT from PICC line, heparin was started on 4/18.  Patient developed anemia of 6.6 4/19 with iron deficiency, receiving PRBC and iron infusion.  Based on my examination patient, he still stable to be transferred to [facility].   #1.  Acute diastolic congestive heart failure. Mitral valve prolapse with moderate mitral regurgitation. Enterococcal endocarditis. Patient has been followed by cardiology, mitral valve changes could be due to endocarditis.  Currently, patient is still on ampicillin and ceftriaxone until 5/17. Discussed  with Dr. Okey Dupre, prefer to transfer patient to [facility] for CT surgery evaluation. Patient received IV Lasix, volume status much improved, currently on torsemide at a higher dose.  #2.  Acute kidney injury secondary to acute postinfectious glomerulonephritis. Hyperkalemia Patient has been seen by nephrology, diuretics changed to oral. Renal function appears to be gradually improving. Potassium level has normalized.  Nephrology has started a high dose of Solu-Medrol 125 mg daily for 3 doses since today.   #3.  Right arm DVT. This appears to be secondary to PICC line. Reviewed literature from up-to-date, recommendation 3 months of anticoagulation, may not need remove PICC line. Since we are not sure patient will need valvular surgery, will start with heparin.  If no surgery is needed, will change to Eliquis.  #4.  Migraine headaches. Continue as needed Fioricet.  #5.    Symptomatic iron deficient anemia. Patient has significant iron deficiency without B12 deficiency.  We will give a unit of PRBC for hemoglobin 6.6, also give iron sucrose 300 mg 1 dose. Patient does not have any black stool or signs of bleeding. CT scan of abdomen/pelvis did not show any retroperitoneal hematoma.  Consults: cardiology and nephrology  Significant Diagnostic Studies:  RIGHT UPPER EXTREMITY VENOUS DOPPLER ULTRASOUND  TECHNIQUE: Gray-scale sonography with graded compression, as well as color Doppler and duplex ultrasound were performed to evaluate the upper extremity deep venous system from the level of the subclavian vein and including the jugular, axillary, basilic, radial, ulnar and upper cephalic vein. Spectral Doppler was utilized to evaluate flow at rest and with distal augmentation maneuvers.  COMPARISON:  None.  FINDINGS: Contralateral Subclavian Vein: Respiratory phasicity is normal and symmetric with the symptomatic side. No evidence of thrombus.  Normal compressibility.  Internal Jugular Vein: No evidence of thrombus. Normal compressibility, respiratory phasicity and response to augmentation.  Subclavian Vein: No evidence of thrombus. Normal compressibility, respiratory phasicity and response to augmentation.  Axillary Vein: No evidence of thrombus. Normal compressibility, respiratory phasicity and response to augmentation.  Cephalic Vein: Partially occlusive thrombus noted in the distal upper arm.  Basilic Vein: No evidence of thrombus. Normal compressibility, respiratory phasicity and response to augmentation.  Brachial Veins: Small amount of nonocclusive thrombus noted in the right brachial vein around the PICC.  Radial Veins: No evidence of thrombus. Normal compressibility, respiratory phasicity and response to augmentation.  Ulnar Veins: No evidence of thrombus. Normal compressibility, respiratory phasicity and response to augmentation.  Venous Reflux:  None visualized.  Other Findings:  None visualized.  IMPRESSION: 1. Small amount of nonocclusive thrombus noted in the distal upper arm cephalic vein. 2. Small amount of nonocclusive thrombus noted adjacent to PICC in the brachial vein in the mid upper arm.   Electronically Signed   By: Acquanetta Belling M.D.   On: 03/17/2021 08:51  BILATERAL LOWER EXTREMITY VENOUS DOPPLER ULTRASOUND  TECHNIQUE: Gray-scale sonography with compression, as well as color and duplex ultrasound, were performed to evaluate the deep venous system(s) from the level of the common femoral vein through the popliteal and proximal calf veins.  COMPARISON:  None.  FINDINGS: VENOUS  Normal compressibility of the common femoral, superficial femoral, and popliteal veins, as well as the visualized calf veins. Visualized portions of profunda femoral vein and great saphenous vein unremarkable. No filling defects to suggest DVT on grayscale or color Doppler imaging.  Doppler waveforms show normal direction of venous flow, normal respiratory plasticity and response to augmentation.  OTHER  None.  Limitations: none  IMPRESSION: Negative for DVT in the bilateral lower extremities.   Electronically Signed   By: Emmaline Kluver M.D.   On: 03/16/2021 12:35  CT ABDOMEN AND PELVIS WITHOUT CONTRAST  TECHNIQUE: Multidetector CT imaging of the abdomen and pelvis was performed following the standard protocol without IV contrast.  COMPARISON:  CT scan 03/06/2021  FINDINGS: Lower chest: Small bilateral pleural effusions and bibasilar atelectasis. The heart is normal in size. No pericardial effusion.  Hepatobiliary: No hepatic lesions are identified without contrast. No intrahepatic biliary dilatation. The gallbladder is grossly normal.  Pancreas: No mass, inflammation or ductal dilatation.  Spleen: The spleen is mildly enlarged measuring 14.5 x 13.0 x 9.9 cm. Small low attenuations splenic lesions appears stable.  Adrenals/Urinary Tract: Adrenal glands and kidneys are unremarkable. No renal calculi or hydronephrosis. Bladder is unremarkable.  Stomach/Bowel: The stomach, duodenum, small bowel and colon are unremarkable. No acute inflammatory changes, mass lesions or obstructive findings. The terminal ileum is normal. The appendix is normal.  Vascular/Lymphatic: The aorta is normal in caliber. No atheroscerlotic calcifications. No mesenteric of retroperitoneal mass or adenopathy. Small scattered lymph nodes are noted.  Reproductive: The prostate gland and seminal vesicles are unremarkable.  Other: Diffuse mesenteric edema without overt ascites. There is also severe diffuse body wall edema suggesting anasarca.  Musculoskeletal: No significant bony findings. 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  IMPRESSION: 1. Small bilateral pleural effusions and bibasilar atelectasis. 2. Severe diffuse body wall  edema suggesting anasarca. 3. Mild splenomegaly. 4. Stable small low attenuation splenic lesions. 5. No acute abdominal/pelvic findings, mass lesions or adenopathy.   Electronically Signed   By: Rudie Meyer M.D.   On: 03/19/2021 13:58  Echo: 1. Left ventricular ejection fraction, by estimation, is 60 to 65%. The left ventricle has normal function. The left ventricle has no regional wall motion  abnormalities. The left ventricular internal cavity size was mildly dilated. Left ventricular diastolic parameters are indeterminate. 2. Right ventricular systolic function is normal. The right ventricular size is normal. There is mildly elevated pulmonary artery systolic pressure. The estimated right ventricular systolic pressure is 44.2 mmHg. 3. Left atrial size was mildly dilated. 4. The pericardial effusion is circumferential. 5. The mitral valve is abnormal. 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 Moderate to severe mitral valve regurgitation. No evidence of mitral stenosis. 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 6. The aortic valve is normal in structure. Aortic valve regurgitation is not visualized. No aortic stenosis is present. 7. The inferior vena cava is normal in size with greater than 50% respiratory variability, suggesting right atrial pressure of 3 mmHg.  Treatments: Heparin, antibiotics  Discharge Exam: Blood pressure (!) 131/101, pulse 74, temperature 97.6 F (36.4 C), resp. rate 18, height 5\' 7"  (1.702 m), weight 81.2 kg, SpO2 100 %. General appearance: alert and cooperative Resp: clear to auscultation bilaterally Cardio: Regular, 2/6 high piched  SM at apex GI: soft, non-tender; bowel sounds normal; no masses,  no organomegaly Extremities: Right arm swelling, better than yesterday  Disposition: Discharge disposition: 02-[facility]       Discharge Instructions    Diet - low sodium heart  healthy   Complete by: As directed    Increase activity slowly   Complete by: As directed      Allergies as of 03/19/2021   No Known Allergies     Medication List    TAKE these medications   acetaminophen 325 MG tablet Commonly known as: TYLENOL Take 2 tablets (650 mg total) by mouth every 6 (six) hours as needed for mild pain, moderate pain, fever or headache (or Fever >/= 101).   ampicillin  IVPB Inject 8 g into the vein continuous. Infuse ampicillin 8gm daily over 24h as continuous infusion Indication: Enterococcal endocarditis First Dose: Yes Last Day of Therapy:  04/16/2021 Labs - Once weekly:  CBC/D, CMP, and CRP  Method of administration: Ambulatory Pump (Continuous Infusion) Method of administration may be changed at the discretion of home infusion pharmacist based upon assessment of the patient and/or caregiver's ability to self-administer the medication ordered.   carvedilol 6.25 MG tablet Commonly known as: COREG Take 1 tablet (6.25 mg total) by mouth 2 (two) times daily with a meal.   cefTRIAXone  IVPB Commonly known as: ROCEPHIN Inject 2 g into the vein every 12 (twelve) hours. Indication: Enterococcal endocarditis First Dose: Yes Last Day of Therapy:  04/16/2021 Labs - Once weekly:  CBC/D, CMP, and CRP  Method of administration: IV Push Method of administration may be changed at the discretion of home infusion pharmacist based upon assessment of the patient and/or caregiver's ability to self-administer the medication ordered.   heparin [id number] UT/250ML infusion Inject 1,800 Units/hr into the vein continuous.   methylPREDNISolone sodium succinate 125 mg/2 mL injection Commonly known as: SOLU-MEDROL Inject 2 mLs (125 mg total) into the vein daily. Start taking on: March 20, 2021   nicotine 14 mg/24hr patch Commonly known as: NICODERM CQ - dosed in mg/24 hours Place 1 patch (14 mg total) onto the skin daily.   torsemide 100 MG tablet Commonly known as:  DEMADEX Take 1 tablet (100 mg total) by mouth daily. What changed:   medication strength  how much to take  when to take this   traZODone 50 MG tablet Commonly known  as: DESYREL Take 0.5 tablets (25 mg total) by mouth at bedtime as needed for sleep.       40 minutes Signed: Marrion Coy 03/19/2021, 8:59 AM

## 2021-03-19 NOTE — H&P (Signed)
History and Physical    Jerome Mccormick:[id number] DOB: 26-May-1991 DOA: 03/19/2021  PCP: Patient, No Pcp Per (Inactive)  Patient coming from: [facility]  I have personally briefly reviewed patient's old medical records in [facility]  Chief Complaint: Endocarditis  HPI: Jerome Mccormick is a 30 y.o. 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  He was found to have a PICC line associated RUE DVT and started on heparin. 03/18/2021 showed moderate to severe mitral valve regurgitation with evidence of ruptured posterior chordae and prolapse of both leaflets of the mitral valve.  Cardiology evaluated patient and recommended transfer to [facility] for cardiac surgery consultation.  Hemoglobin was 6.6 today and patient received transfusion of 1 unit PRBC and IV iron prior to transfer.  On arrival, patient states he is feeling well.  His lower extremity edema is improving significantly.  He has no shortness of breath, chest pain, subjective fevers, chills, diaphoresis, abdominal pain, nausea, vomiting, or dysuria.  He reports good urine output.  He denies any history of IV drug use.  Review of Systems: All systems reviewed and are negative except as documented in history of present illness above.   Past Medical History:  Diagnosis Date  . Allergy   . Headache   . Postinfectious glomerulonephritis     Past Surgical History:  Procedure Laterality Date  . TEE WITHOUT CARDIOVERSION N/A 03/06/2021   Procedure: TRANSESOPHAGEAL ECHOCARDIOGRAM (TEE);  Surgeon: Debbe Odea, MD;  Location: [facility] ORS;  Service: Cardiovascular;  Laterality: N/A;    Social History:  reports that he has been  smoking cigarettes. He has been smoking about 0.75 packs per day. He has never used smokeless tobacco. He reports current alcohol use. He reports current drug use. Drug: Marijuana.  No Known Allergies  No family history on file.   Prior to Admission medications   Medication Sig Start Date End Date Taking? Authorizing Provider  acetaminophen (TYLENOL) 325 MG tablet Take 2 tablets (650 mg total) by mouth every 6 (six) hours as needed for mild pain, moderate pain, fever or headache (or Fever >/= 101). 03/11/21   Charise Killian, MD  ampicillin IVPB Inject 8 g into the vein continuous. Infuse ampicillin 8gm daily over 24h as continuous infusion Indication: Enterococcal endocarditis First Dose: Yes Last Day of Therapy:  04/16/2021 Labs - Once weekly:  CBC/D, CMP, and CRP  Method of administration: Ambulatory Pump (Continuous Infusion) Method of administration may be changed at the discretion of home infusion pharmacist based upon assessment of the patient and/or caregiver's ability to self-administer the medication ordered. 03/11/21 04/16/21  Charise Killian, MD  carvedilol (COREG) 6.25 MG tablet Take 1 tablet (6.25 mg total) by mouth 2 (two) times daily with a meal. 03/19/21   Marrion Coy, MD  cefTRIAXone (ROCEPHIN) IVPB Inject 2 g into the vein every 12 (twelve) hours. Indication: Enterococcal endocarditis First Dose: Yes Last Day of Therapy:  04/16/2021 Labs - Once weekly:  CBC/D, CMP, and CRP  Method of administration: IV Push Method of administration may be changed at the discretion of home infusion pharmacist based upon assessment of the patient and/or caregiver's ability to self-administer the medication ordered. 03/11/21 04/16/21  Charise Killian, MD  heparin 16109 UT/250ML infusion Inject 1,800 Units/hr into the vein continuous. 03/19/21   Marrion Coy, MD  methylPREDNISolone sodium succinate (SOLU-MEDROL) 125 mg/2 mL injection Inject 2 mLs (125 mg total) into the vein daily. 03/20/21    Marrion Coy, MD  nicotine (NICODERM CQ - DOSED IN MG/24 HOURS) 14 mg/24hr patch Place 1 patch (14 mg total) onto the skin daily. 03/19/21   Marrion Coy, MD  torsemide (DEMADEX) 100 MG tablet Take 1 tablet (100 mg total) by mouth daily. 03/19/21   Marrion Coy, MD  traZODone (DESYREL) 50 MG tablet Take 0.5 tablets (25 mg total) by mouth at bedtime as needed for sleep. 03/19/21   Marrion Coy, MD    Physical Exam: Vitals:   03/19/21 1810  BP: (!) 140/94  Pulse: 73  Resp: 16  Temp: 98.3 F (36.8 C)  TempSrc: Oral  SpO2: 100%  Weight: 78.4 kg  Height: 5\' 7"  (1.702 m)   Constitutional: Resting in bed, NAD, calm, comfortable Eyes: PERRL, lids and conjunctivae normal ENMT: Mucous membranes are moist. Posterior pharynx clear of any exudate or lesions.Normal dentition.  Neck: normal, supple, no masses. Respiratory: clear to auscultation bilaterally, no wheezing, no crackles. Normal respiratory effort. No accessory muscle use.  Cardiovascular: Regular rate and rhythm, systolic murmur left lower sternal border and holosystolic murmur in mitral region.  Trace to 1+ bilateral lower extremity edema. 2+ pedal pulses. Abdomen: no tenderness, no masses palpated. No hepatosplenomegaly.  Musculoskeletal: no clubbing / cyanosis. No joint deformity upper and lower extremities. Good ROM, no contractures. Normal muscle tone.  Skin: no rashes, lesions, ulcers. No induration Neurologic: CN 2-12 grossly intact. Sensation intact. Strength 5/5 in all 4.  Psychiatric: Normal judgment and insight. Alert and oriented x 3. Normal mood.   Labs on Admission: I have personally reviewed following labs and imaging studies  CBC: Recent Labs  Lab 03/16/21 0658 03/19/21 0530 03/19/21 2000  WBC 8.0 5.8  --   NEUTROABS 6.0  --   --   HGB 8.6* 6.6* 8.7*  HCT 27.9* 20.8* 27.0*  MCV 89.1 87.4  --   PLT 218 222  --    Basic Metabolic Panel: Recent Labs  Lab 03/16/21 0658 03/17/21 0550 03/18/21 0530  03/19/21 0530  NA 138 138 140 139  K 5.7* 5.3* 4.7 4.1  CL 107 102 104 102  CO2 23 25 26 29   GLUCOSE 97 95 97 110*  BUN 69* 64* 56* 49*  CREATININE 3.26* 3.32* 3.13* 2.82*  CALCIUM 8.9 8.4* 8.2* 7.9*  MG  --   --   --  1.9   GFR: Estimated Creatinine Clearance: 35.8 mL/min (A) (by C-G formula based on SCr of 2.82 mg/dL (H)). Liver Function Tests: Recent Labs  Lab 03/16/21 0658  AST 27  ALT 23  ALKPHOS 33*  BILITOT 0.5  PROT 6.3*  ALBUMIN 2.6*   No results for input(s): LIPASE, AMYLASE in the last 168 hours. No results for input(s): AMMONIA in the last 168 hours. Coagulation Profile: No results for input(s): INR, PROTIME in the last 168 hours. Cardiac Enzymes: No results for input(s): CKTOTAL, CKMB, CKMBINDEX, TROPONINI in the last 168 hours. BNP (last 3 results) No results for input(s): PROBNP in the last 8760 hours. HbA1C: No results for input(s): HGBA1C in the last 72 hours. CBG: No results for input(s): GLUCAP in the last 168 hours. Lipid Profile: No results for input(s): CHOL, HDL, LDLCALC, TRIG, CHOLHDL, LDLDIRECT in the last 72 hours. Thyroid Function Tests: No results  for input(s): TSH, T4TOTAL, FREET4, T3FREE, THYROIDAB in the last 72 hours. Anemia Panel: Recent Labs    03/18/21 1501  VITAMINB12 581  TIBC 232*  IRON 26*   Urine analysis:    Component Value Date/Time   COLORURINE YELLOW (A) 03/16/2021 1056   APPEARANCEUR HAZY (A) 03/16/2021 1056   LABSPEC 1.013 03/16/2021 1056   PHURINE 5.0 03/16/2021 1056   GLUCOSEU NEGATIVE 03/16/2021 1056   HGBUR LARGE (A) 03/16/2021 1056   BILIRUBINUR NEGATIVE 03/16/2021 1056   KETONESUR NEGATIVE 03/16/2021 1056   PROTEINUR >=300 (A) 03/16/2021 1056   NITRITE NEGATIVE 03/16/2021 1056   LEUKOCYTESUR SMALL (A) 03/16/2021 1056    Radiological Exams on Admission: CT ABDOMEN PELVIS WO CONTRAST  Result Date: 03/19/2021 CLINICAL DATA:  Abdominal pain. Renal failure. Lower extremity swelling. EXAM: CT ABDOMEN AND  PELVIS WITHOUT CONTRAST TECHNIQUE: Multidetector CT imaging of the abdomen and pelvis was performed following the standard protocol without IV contrast. COMPARISON:  CT scan 03/06/2021 FINDINGS: Lower chest: Small bilateral pleural effusions and bibasilar atelectasis. The heart is normal in size. No pericardial effusion. Hepatobiliary: No hepatic lesions are identified without contrast. No intrahepatic biliary dilatation. The gallbladder is grossly normal. Pancreas: No mass, inflammation or ductal dilatation. Spleen: The spleen is mildly enlarged measuring 14.5 x 13.0 x 9.9 cm. Small low attenuations splenic lesions appears stable. Adrenals/Urinary Tract: Adrenal glands and kidneys are unremarkable. No renal calculi or hydronephrosis. Bladder is unremarkable. Stomach/Bowel: The stomach, duodenum, small bowel and colon are unremarkable. No acute inflammatory changes, mass lesions or obstructive findings. The terminal ileum is normal. The appendix is normal. Vascular/Lymphatic: The aorta is normal in caliber. No atheroscerlotic calcifications. No mesenteric of retroperitoneal mass or adenopathy. Small scattered lymph nodes are noted. Reproductive: The prostate gland and seminal vesicles are unremarkable. Other: Diffuse mesenteric edema without overt ascites. There is also severe diffuse body wall edema suggesting anasarca. Musculoskeletal: No significant bony findings. 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 IMPRESSION: 1. Small bilateral pleural effusions and bibasilar atelectasis. 2. Severe diffuse body wall edema suggesting anasarca. 3. Mild splenomegaly. 4. Stable small low attenuation splenic lesions. 5. No acute abdominal/pelvic findings, mass lesions or adenopathy. Electronically Signed   By: Rudie Meyer M.D.   On: 03/19/2021 13:58   ECHOCARDIOGRAM COMPLETE  Result Date: 03/18/2021    ECHOCARDIOGRAM REPORT   Patient Name:   Jerome Mccormick Date of Exam: 03/18/2021 Medical Rec #:  [id number]       Height:       67.0 in Accession #:    [id number]     Weight:       179.8 lb Date of Birth:  Aug 19, 1991      BSA:          1.933 m Patient Age:    30 years       BP:           130/92 mmHg Patient Gender: M              HR:           78 bpm. Exam Location:  [facility] Procedure: 2D Echo, Color Doppler and Cardiac Doppler Indications:     I34.0 Mitral valve insufficiency  History:         Patient has prior history of Echocardiogram examinations, most                  recent 03/06/2021. Mitral Valve Disease.  Sonographer:     Humphrey Rolls RDCS (  AE) Referring Phys:  [id number] Lennon Alstrom Diagnosing Phys: Lorine Bears MD IMPRESSIONS  1. Left ventricular ejection fraction, by estimation, is 60 to 65%. The left ventricle has normal function. The left ventricle has no regional wall motion abnormalities. The left ventricular internal cavity size was mildly dilated. Left ventricular diastolic parameters are indeterminate.  2. Right ventricular systolic function is normal. The right ventricular size is normal. There is mildly elevated pulmonary artery systolic pressure. The estimated right ventricular systolic pressure is 44.2 mmHg.  3. Left atrial size was mildly dilated.  4. The pericardial effusion is circumferential.  5. The mitral valve is abnormal. 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 Moderate to severe mitral valve regurgitation. No evidence of mitral stenosis. 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  6. The aortic valve is normal in structure. Aortic valve regurgitation is not visualized. No aortic stenosis is present.  7. The inferior vena cava is normal in size with greater than 50% respiratory variability, suggesting right atrial pressure of 3 mmHg. FINDINGS  Left Ventricle: Left ventricular ejection fraction, by estimation, is 60 to 65%. The left ventricle has normal function. The left ventricle has no regional wall motion abnormalities. The left ventricular internal  cavity size was mildly dilated. There is  no left ventricular hypertrophy. Left ventricular diastolic parameters are indeterminate. Right Ventricle: The right ventricular size is normal. No increase in right ventricular wall thickness. Right ventricular systolic function is normal. There is mildly elevated pulmonary artery systolic pressure. The tricuspid regurgitant velocity is 3.21  m/s, and with an assumed right atrial pressure of 3 mmHg, the estimated right ventricular systolic pressure is 44.2 mmHg. Left Atrium: Left atrial size was mildly dilated. Right Atrium: Right atrial size was normal in size. Pericardium: Trivial pericardial effusion is present. The pericardial effusion is circumferential. Mitral Valve: The mitral valve is abnormal. 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 There is moderate thickening of the mitral valve leaflet(s). Moderate to severe mitral valve regurgitation. No evidence of mitral valve stenosis. MV peak gradient, 6.2 mmHg. The mean mitral valve gradient is 4.0 mmHg. Tricuspid Valve: The tricuspid valve is normal in structure. Tricuspid valve regurgitation is mild . No evidence of tricuspid stenosis. Aortic Valve: The aortic valve is normal in structure. Aortic valve regurgitation is not visualized. No aortic stenosis is present. Aortic valve mean gradient measures 4.0 mmHg. Aortic valve peak gradient measures 8.0 mmHg. Aortic valve area, by VTI measures 3.03 cm. Pulmonic Valve: The pulmonic valve was normal in structure. Pulmonic valve regurgitation is mild. No evidence of pulmonic stenosis. Aorta: The aortic root is normal in size and structure. Venous: The inferior vena cava is normal in size with greater than 50% respiratory variability, suggesting right atrial pressure of 3 mmHg. IAS/Shunts: No atrial level shunt detected by color flow Doppler.  LEFT VENTRICLE PLAX 2D LVIDd:         5.30 cm  Diastology LVIDs:         3.50 cm  LV e' medial:     9.46 cm/s LV PW:         1.00 cm  LV E/e' medial:  14.3 LV IVS:        0.80 cm  LV e' lateral:   16.80 cm/s LVOT diam:     2.10 cm  LV E/e' lateral: 8.0 LV SV:         83 LV SV Index:  43 LVOT Area:     3.46 cm  RIGHT VENTRICLE RV Basal diam:  2.80 cm TAPSE (M-mode): 3.4 cm LEFT ATRIUM             Index       RIGHT ATRIUM           Index LA diam:        4.00 cm 2.07 cm/m  RA Area:     15.10 cm LA Vol (A2C):   55.5 ml 28.72 ml/m RA Volume:   31.80 ml  16.45 ml/m LA Vol (A4C):   73.5 ml 38.03 ml/m LA Biplane Vol: 63.9 ml 33.06 ml/m  AORTIC VALVE                   PULMONIC VALVE AV Area (Vmax):    2.95 cm    PV Vmax:       0.85 m/s AV Area (Vmean):   2.94 cm    PV Vmean:      58.000 cm/s AV Area (VTI):     3.03 cm    PV VTI:        0.204 m AV Vmax:           141.00 cm/s PV Peak grad:  2.9 mmHg AV Vmean:          99.400 cm/s PV Mean grad:  1.0 mmHg AV VTI:            0.273 m AV Peak Grad:      8.0 mmHg AV Mean Grad:      4.0 mmHg LVOT Vmax:         120.00 cm/s LVOT Vmean:        84.300 cm/s LVOT VTI:          0.239 m LVOT/AV VTI ratio: 0.88  AORTA Ao Root diam: 3.30 cm MITRAL VALVE                TRICUSPID VALVE MV Area (PHT): 4.29 cm     TR Peak grad:   41.2 mmHg MV Area VTI:   2.71 cm     TR Vmax:        321.00 cm/s MV Peak grad:  6.2 mmHg MV Mean grad:  4.0 mmHg     SHUNTS MV Vmax:       1.25 m/s     Systemic VTI:  0.24 m MV Vmean:      90.6 cm/s    Systemic Diam: 2.10 cm MV Decel Time: 177 msec MV E velocity: 135.00 cm/s MV A velocity: 95.40 cm/s MV E/A ratio:  1.42 Lorine BearsMuhammad Arida MD Electronically signed by Lorine BearsMuhammad Arida MD Signature Date/Time: 03/18/2021/2:50:06 PM    Final     EKG: Personally reviewed. 03/16/2021.  Sinus rhythm without acute ischemic changes, motion artifact.  No prior for comparison.  Assessment/Plan Active Problems:   Mitral valve prolapse   Jerome Mccormick is a 30 y.o. male with medical history significant for recent diagnosis of enterococcal endocarditis who has been on IV  antibiotics (ceftriaxone 2 g IV every 12 hours and ampicillin 8 g IV daily for 6 weeks, last dose 04/16/2021), postinfectious glomerulonephritis who is admitted with acute on chronic HFpEF in setting of presumed Enterococcus endocarditis with mitral valve regurgitation and prolapse.  Acute on chronic HFpEF Severe mitral regurgitation, prolapse, ruptured posterior coronary: TTE 4/18 showed EF 60-65% with mitral valve findings as above.  CHF is currently compensated. -Continue Coreg 6.25 mg twice daily -Continue torsemide  100 mg daily -Strict I/O's and daily weights -Consult cardiothoracic surgery in a.m.  Enterococcus endocarditis: Enterococcus growing on blood cultures 03/05/2021. TEE performed 4/6 showed mitral valve changes as above, presumed due to endocarditis although no clear evidence for endocarditis per report. -Continue ceftriaxone 2 g IV every 12 hours and ampicillin 8 g IV daily for 6 weeks, last dose 04/16/2021  Acute kidney injury due to postinfectious glomerulonephritis: Seen by nephrology while at [facility].  Recommendation was for pulse IV Solu-Medrol 125 mg daily x3 days (received first dose 4/19) followed by 6-8 weeks of continued steroids.  PICC line associated RUE DVT: Continue IV heparin.  PICC line currently functioning, monitor closely for need to remove PICC line  Symptomatic iron deficiency anemia: Hemoglobin 6.6 4/19 AM.  Received 1 unit PRBC transfusion and IV iron today with repeat hemoglobin improved to 8.7.  Monitor for signs/symptoms of bleeding.  DVT prophylaxis: Heparin drip Code Status: Full code, confirmed with patient Family Communication: Discussed with patient, he has discussed with family Disposition Plan: From home, dispo pending clinical progress Consults called: Seen by cardiology, nephrology at [facility].  Will need CT surgery consult in a.m. Level of care: Telemetry Cardiac Admission status:  Status is: Inpatient  Remains inpatient appropriate because:IV  treatments appropriate due to intensity of illness or inability to take PO and Inpatient level of care appropriate due to severity of illness   Dispo: The patient is from: Home              Anticipated d/c is to: Home              Patient currently is not medically stable to d/c.   Difficult to place patient No   Darreld Mclean MD [facility]  If 7PM-7AM, please contact night-coverage [email or web address]  03/19/2021, 9:16 PM

## 2021-03-19 NOTE — Progress Notes (Signed)
Pharmacy Antibiotic Note  Jerome Mccormick is a 77 YOMwith medical history significant forrecent diagnosis of enterococcal endocarditis discharged home on IV antibiotics(Rocephin 2 g IV every 12 and ampicillin 8 g IV daily for 6 weeks,last dose05/17/22),  Pharmacy has been consulted for to resume dosing this admission.  Plan: - Restart Ampicillin 2g IV every 6 hours - Restart Rocephin 2g IV every 12 hours - Will continue to follow renal function, culture results, LOT, and antibiotic de-escalation plans   Height: 5\' 7"  (170.2 cm) Weight: 78.4 kg (172 lb 12.8 oz) IBW/kg (Calculated) : 66.1  Temp (24hrs), Avg:98.5 F (36.9 C), Min:97.6 F (36.4 C), Max:99 F (37.2 C)  Recent Labs  Lab 03/16/21 0658 03/17/21 0550 03/18/21 0530 03/19/21 0530  WBC 8.0  --   --  5.8  CREATININE 3.26* 3.32* 3.13* 2.82*    Estimated Creatinine Clearance: 35.8 mL/min (A) (by C-G formula based on SCr of 2.82 mg/dL (H)).    No Known Allergies   Thank you for allowing pharmacy to be a part of this patient's care.  03/21/21, PharmD, BCPS Clinical Pharmacist Clinical phone for 03/19/2021: [phone] 03/19/2021 7:58 PM   **Pharmacist phone directory can now be found on [email or web address] ([email or web address]).  Listed under [facility].

## 2021-03-20 ENCOUNTER — Inpatient Hospital Stay (HOSPITAL_COMMUNITY): Payer: Medicaid Other

## 2021-03-20 ENCOUNTER — Encounter (HOSPITAL_COMMUNITY): Payer: Self-pay | Admitting: Internal Medicine

## 2021-03-20 ENCOUNTER — Encounter: Payer: Self-pay | Admitting: Thoracic Surgery (Cardiothoracic Vascular Surgery)

## 2021-03-20 DIAGNOSIS — I340 Nonrheumatic mitral (valve) insufficiency: Secondary | ICD-10-CM

## 2021-03-20 DIAGNOSIS — I330 Acute and subacute infective endocarditis: Principal | ICD-10-CM

## 2021-03-20 DIAGNOSIS — I5033 Acute on chronic diastolic (congestive) heart failure: Secondary | ICD-10-CM

## 2021-03-20 DIAGNOSIS — B952 Enterococcus as the cause of diseases classified elsewhere: Secondary | ICD-10-CM

## 2021-03-20 DIAGNOSIS — D649 Anemia, unspecified: Secondary | ICD-10-CM

## 2021-03-20 DIAGNOSIS — N179 Acute kidney failure, unspecified: Secondary | ICD-10-CM

## 2021-03-20 DIAGNOSIS — Z01810 Encounter for preprocedural cardiovascular examination: Secondary | ICD-10-CM

## 2021-03-20 DIAGNOSIS — I82721 Chronic embolism and thrombosis of deep veins of right upper extremity: Secondary | ICD-10-CM

## 2021-03-20 DIAGNOSIS — Z7901 Long term (current) use of anticoagulants: Secondary | ICD-10-CM

## 2021-03-20 DIAGNOSIS — I341 Nonrheumatic mitral (valve) prolapse: Secondary | ICD-10-CM

## 2021-03-20 LAB — BPAM RBC
Blood Product Expiration Date: 202205132359
ISSUE DATE / TIME: 202204191339
Unit Type and Rh: 6200

## 2021-03-20 LAB — CBC
HCT: 23.3 % — ABNORMAL LOW (ref 39.0–52.0)
Hemoglobin: 7.4 g/dL — ABNORMAL LOW (ref 13.0–17.0)
MCH: 28.1 pg (ref 26.0–34.0)
MCHC: 31.8 g/dL (ref 30.0–36.0)
MCV: 88.6 fL (ref 80.0–100.0)
Platelets: 253 10*3/uL (ref 150–400)
RBC: 2.63 MIL/uL — ABNORMAL LOW (ref 4.22–5.81)
RDW: 14.8 % (ref 11.5–15.5)
WBC: 5.8 10*3/uL (ref 4.0–10.5)
nRBC: 0 % (ref 0.0–0.2)

## 2021-03-20 LAB — BASIC METABOLIC PANEL
Anion gap: 8 (ref 5–15)
BUN: 43 mg/dL — ABNORMAL HIGH (ref 6–20)
CO2: 29 mmol/L (ref 22–32)
Calcium: 7.8 mg/dL — ABNORMAL LOW (ref 8.9–10.3)
Chloride: 102 mmol/L (ref 98–111)
Creatinine, Ser: 2.68 mg/dL — ABNORMAL HIGH (ref 0.61–1.24)
GFR, Estimated: 32 mL/min — ABNORMAL LOW (ref >60)
Glucose, Bld: 88 mg/dL (ref 70–99)
Potassium: 3.8 mmol/L (ref 3.5–5.1)
Sodium: 139 mmol/L (ref 135–145)

## 2021-03-20 LAB — TYPE AND SCREEN
ABO/RH(D): A POS
ABO/RH(D): A POS
Antibody Screen: NEGATIVE
Antibody Screen: NEGATIVE
Unit division: 0

## 2021-03-20 LAB — URINALYSIS, COMPLETE (UACMP) WITH MICROSCOPIC
Bilirubin Urine: NEGATIVE
Glucose, UA: NEGATIVE mg/dL
Ketones, ur: NEGATIVE mg/dL
Leukocytes,Ua: NEGATIVE
Nitrite: NEGATIVE
Protein, ur: 100 mg/dL — AB
RBC / HPF: >50 RBC/hpf — ABNORMAL HIGH (ref 0–5)
Specific Gravity, Urine: 1.009 (ref 1.005–1.030)
pH: 5 (ref 5.0–8.0)

## 2021-03-20 LAB — HEPARIN LEVEL (UNFRACTIONATED): Heparin Unfractionated: 0.2 IU/mL — ABNORMAL LOW (ref 0.30–0.70)

## 2021-03-20 LAB — APTT: aPTT: 34 seconds (ref 24–36)

## 2021-03-20 LAB — COOXEMETRY PANEL
Carboxyhemoglobin: 1.4 % (ref 0.5–1.5)
Methemoglobin: 0.8 % (ref 0.0–1.5)
O2 Saturation: 63.6 %
Total hemoglobin: 7.9 g/dL — ABNORMAL LOW (ref 12.0–16.0)

## 2021-03-20 LAB — PROTIME-INR
INR: 1.2 (ref 0.8–1.2)
Prothrombin Time: 15 seconds (ref 11.4–15.2)

## 2021-03-20 LAB — MRSA PCR SCREENING: MRSA by PCR: NEGATIVE

## 2021-03-20 MED ORDER — TEMAZEPAM 15 MG PO CAPS
15.0000 mg | ORAL_CAPSULE | Freq: Once | ORAL | Status: AC | PRN
  Administered 2021-03-20: 15 mg via ORAL
  Filled 2021-03-20: qty 1

## 2021-03-20 MED ORDER — PHENYLEPHRINE HCL-NACL 20-0.9 MG/250ML-% IV SOLN
30.0000 ug/min | INTRAVENOUS | Status: DC
  Filled 2021-03-20: qty 250

## 2021-03-20 MED ORDER — SODIUM CHLORIDE 0.9 % IV SOLN
1.5000 g | INTRAVENOUS | Status: AC
  Administered 2021-03-21: 1.5 g via INTRAVENOUS
  Filled 2021-03-20: qty 1.5

## 2021-03-20 MED ORDER — CHLORHEXIDINE GLUCONATE 4 % EX LIQD
60.0000 mL | Freq: Once | CUTANEOUS | Status: AC
  Administered 2021-03-20: 4 via TOPICAL

## 2021-03-20 MED ORDER — PLASMA-LYTE 148 IV SOLN
INTRAVENOUS | Status: DC
  Filled 2021-03-20: qty 2.5

## 2021-03-20 MED ORDER — INSULIN REGULAR(HUMAN) IN NACL 100-0.9 UT/100ML-% IV SOLN
INTRAVENOUS | Status: AC
  Administered 2021-03-21: 1.1 [IU]/h via INTRAVENOUS
  Filled 2021-03-20: qty 100

## 2021-03-20 MED ORDER — VANCOMYCIN HCL 1000 MG IV SOLR
INTRAVENOUS | Status: DC
  Filled 2021-03-20: qty 1000

## 2021-03-20 MED ORDER — NOREPINEPHRINE 4 MG/250ML-% IV SOLN
0.0000 ug/min | INTRAVENOUS | Status: DC
  Filled 2021-03-20: qty 250

## 2021-03-20 MED ORDER — POLYETHYLENE GLYCOL 3350 17 G PO PACK: 17.0000 g | PACK | Freq: Every day | ORAL | Status: DC

## 2021-03-20 MED ORDER — VANCOMYCIN HCL 1250 MG/250ML IV SOLN
1250.0000 mg | INTRAVENOUS | Status: AC
  Administered 2021-03-21: 1250 mg via INTRAVENOUS
  Filled 2021-03-20: qty 250

## 2021-03-20 MED ORDER — CHLORHEXIDINE GLUCONATE CLOTH 2 % EX PADS
6.0000 | MEDICATED_PAD | Freq: Every day | CUTANEOUS | Status: DC
  Administered 2021-03-20: 6 via TOPICAL

## 2021-03-20 MED ORDER — SODIUM CHLORIDE 0.9 % IV SOLN
750.0000 mg | INTRAVENOUS | Status: AC
  Administered 2021-03-21: 750 mg via INTRAVENOUS
  Filled 2021-03-20: qty 750

## 2021-03-20 MED ORDER — GADOBUTROL 1 MMOL/ML IV SOLN
8.0000 mL | Freq: Once | INTRAVENOUS | Status: AC | PRN
  Administered 2021-03-20: 8 mL via INTRAVENOUS

## 2021-03-20 MED ORDER — HEPARIN BOLUS VIA INFUSION
2000.0000 [IU] | Freq: Once | INTRAVENOUS | Status: AC
  Administered 2021-03-20: 2000 [IU] via INTRAVENOUS
  Filled 2021-03-20: qty 2000

## 2021-03-20 MED ORDER — TRANEXAMIC ACID (OHS) PUMP PRIME SOLUTION
2.0000 mg/kg | INTRAVENOUS | Status: DC
  Filled 2021-03-20: qty 1.59

## 2021-03-20 MED ORDER — MILRINONE LACTATE IN DEXTROSE 20-5 MG/100ML-% IV SOLN
0.3000 ug/kg/min | INTRAVENOUS | Status: DC
  Filled 2021-03-20: qty 100

## 2021-03-20 MED ORDER — DEXMEDETOMIDINE HCL IN NACL 400 MCG/100ML IV SOLN
0.1000 ug/kg/h | INTRAVENOUS | Status: AC
  Administered 2021-03-21: .7 ug/kg/h via INTRAVENOUS
  Filled 2021-03-20: qty 100

## 2021-03-20 MED ORDER — MANNITOL 20 % IV SOLN
Freq: Once | INTRAVENOUS | Status: DC
  Filled 2021-03-20: qty 13

## 2021-03-20 MED ORDER — POTASSIUM CHLORIDE 2 MEQ/ML IV SOLN
80.0000 meq | INTRAVENOUS | Status: DC
  Filled 2021-03-20: qty 40

## 2021-03-20 MED ORDER — BISACODYL 5 MG PO TBEC
5.0000 mg | DELAYED_RELEASE_TABLET | Freq: Once | ORAL | Status: AC
  Administered 2021-03-20: 5 mg via ORAL
  Filled 2021-03-20: qty 1

## 2021-03-20 MED ORDER — CHLORHEXIDINE GLUCONATE 0.12 % MT SOLN
15.0000 mL | Freq: Once | OROMUCOSAL | Status: AC
Start: 2021-03-21 — End: 2021-03-21
  Administered 2021-03-21: 15 mL via OROMUCOSAL
  Filled 2021-03-20: qty 15

## 2021-03-20 MED ORDER — GLUTARALDEHYDE 0.625% SOAKING SOLUTION
TOPICAL | Status: DC
  Filled 2021-03-20: qty 50

## 2021-03-20 MED ORDER — SODIUM CHLORIDE 0.9 % IV SOLN
INTRAVENOUS | Status: DC
  Filled 2021-03-20: qty 30

## 2021-03-20 MED ORDER — TRANEXAMIC ACID (OHS) BOLUS VIA INFUSION
15.0000 mg/kg | INTRAVENOUS | Status: AC
  Administered 2021-03-21: 1191 mg via INTRAVENOUS
  Filled 2021-03-20: qty 1191

## 2021-03-20 MED ORDER — EPINEPHRINE HCL 5 MG/250ML IV SOLN IN NS
0.0000 ug/min | INTRAVENOUS | Status: DC
  Filled 2021-03-20: qty 250

## 2021-03-20 MED ORDER — TRANEXAMIC ACID 1000 MG/10ML IV SOLN
1.5000 mg/kg/h | INTRAVENOUS | Status: AC
  Administered 2021-03-21: 1.5 mg/kg/h via INTRAVENOUS
  Filled 2021-03-20: qty 25

## 2021-03-20 MED ORDER — NITROGLYCERIN IN D5W 200-5 MCG/ML-% IV SOLN
2.0000 ug/min | INTRAVENOUS | Status: AC
  Administered 2021-03-21: 5 ug/min via INTRAVENOUS
  Filled 2021-03-20: qty 250

## 2021-03-20 MED ORDER — CHLORHEXIDINE GLUCONATE 4 % EX LIQD
60.0000 mL | Freq: Once | CUTANEOUS | Status: AC
  Administered 2021-03-21: 4 via TOPICAL
  Filled 2021-03-20: qty 15

## 2021-03-20 MED ORDER — POLYETHYLENE GLYCOL 3350 17 G PO PACK: 17.0000 g | PACK | Freq: Every day | ORAL | Status: DC | PRN

## 2021-03-20 NOTE — Progress Notes (Signed)
ANTICOAGULATION CONSULT NOTE  Pharmacy Consult for Heparin Indication: DVT  Labs: Recent Labs    03/18/21 0530 03/18/21 2225 03/19/21 0530 03/19/21 1948 03/19/21 2000 03/20/21 0409  HGB  --    < > 6.6*  --  8.7* 7.4*  HCT  --   --  20.8*  --  27.0* 23.3*  PLT  --   --  222  --   --  253  HEPARINUNFRC  --    < > <0.10* <0.10*  --  0.20*  CREATININE 3.13*  --  2.82*  --   --  2.68*   < > = values in this interval not displayed.    Assessment: 30 YOM currently receiving treatment for enterococcal IE and now found via dopplers on 4/17 to have a small amount of nonocclusive thrombus noted in the distal upper arm cephalic vein and a small amount of nonocclusive thrombus noted adjacent to PICC in the brachial vein in the mid upper arm. Pharmacy has been asked to start start heparin.   Heparin level this am 0.2 units/ml.  No issues noted with infusion.  No bleeding reported  Goal of Therapy:  Heparin level 0.3-0.7 units/ml Monitor platelets by anticoagulation protocol: Yes   Plan:  Heparin 2000 unit bolus and increase drip to 2300 units/hr Check heparin level in 6-8 hours  Talbert Cage, PharmD Clinical Pharmacist

## 2021-03-20 NOTE — Consult Note (Signed)
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 male with headaches, seasonal allergies admitted in early April with headache/N/V/fevers/hematuria and new onset AKI. 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 Imaging showing splenic infarct. He was sent home on 6 wk of amp plus ceftriaxone through 5/17 plus low dose diuretics for HFpEF. However, his course has been complicted by worsening renal fialure plus had RUE DVT associated with his picc line requiring anticoagulation. On follow up with cardiology his TEE on 4/18 is progressively worse with now severe MR and prolapse of both leaflets of the MV with ruptured posterior chordae. He also required RBC and IV iron infusion prior to transfer from [facility]. He is now at [facility] for CT surgery evaluation due to progressive valvular dysfunction. Patient remains afebrile. Denies shortness of breath but does have lower extremity edema.    Past Medical History:  Diagnosis Date  . Allergy   . Headache   . Postinfectious glomerulonephritis     Allergies: No Known Allergies  MEDICATIONS: . bisacodyl  5 mg Oral Once  . carvedilol  6.25 mg Oral BID WC  . chlorhexidine  60 mL Topical Once   And  . [START ON 03/21/2021] chlorhexidine  60 mL Topical Once  . [START ON 03/21/2021] chlorhexidine  15 mL Mouth/Throat Once  . Chlorhexidine Gluconate Cloth  6 each Topical Daily  . [START ON 03/21/2021]  epinephrine  0-10 mcg/min Intravenous To OR  . [START ON 03/21/2021] glutaraldehyde   Topical On Call to OR  . [START ON 03/21/2021] heparin-papaverine-plasmalyte irrigation   Irrigation To OR  . [START ON 03/21/2021] insulin   Intravenous To OR  . [place] Blood Cardioplegia vial (lidocaine/magnesium/mannitol 0.26g-4g-6.4g)   Intracoronary Once  . methylPREDNISolone sodium succinate  125 mg Intravenous Q24H  . nicotine  14 mg Transdermal Daily  . [START ON 03/21/2021] phenylephrine  30-200 mcg/min Intravenous To OR  . [START ON 03/21/2021] potassium chloride  80 mEq Other To OR  . sodium chloride flush  3 mL Intravenous Q12H  . torsemide  100 mg Oral Daily  . [START ON 03/21/2021] tranexamic acid  15 mg/kg Intravenous To OR  . [START ON 03/21/2021] tranexamic acid  2 mg/kg Intracatheter To OR  . [START ON 03/21/2021] vancomycin 1000 mg in NS (1000 ml) irrigation for Dr. Roxy Manns case   Irrigation To OR    Social History   Tobacco Use  . Smoking status: Current Every Day Smoker    Packs/day: 0.75    Types: Cigarettes  . Smokeless tobacco: Never Used  Substance Use Topics  . Alcohol use: Yes    Comment: >100 oz per week - beer  . Drug use: Yes    Types: Marijuana    No family history on file.  Review of Systems  Constitutional: Negative for fever, chills, diaphoresis, activity change, appetite change, fatigue and unexpected weight change.  HENT: Negative for congestion, sore  throat, rhinorrhea, sneezing, trouble swallowing and sinus pressure.  Eyes: Negative for photophobia and visual disturbance.  Respiratory:  + shortness of breath with exertion. Negative for cough, chest tightness,  wheezing and stridor.  Cardiovascular: Negative for chest pain, palpitations and leg swelling.  Gastrointestinal: Negative for nausea, vomiting, abdominal pain, diarrhea, constipation, blood in stool, abdominal distention and anal bleeding.  Genitourinary: Negative for dysuria, hematuria, flank pain and  difficulty urinating.  Musculoskeletal: Negative for myalgias, back pain, joint swelling, arthralgias and gait problem.  Skin: Negative for color change, pallor, rash and wound.  Neurological: Negative for dizziness, tremors, weakness and light-headedness.  Hematological: Negative for adenopathy. Does not bruise/bleed easily.  Psychiatric/Behavioral: Negative for behavioral problems, confusion, sleep disturbance, dysphoric mood, decreased concentration and agitation.     OBJECTIVE: Temp:  [97.5 F (36.4 C)-98.7 F (37.1 C)] 98.2 F (36.8 C) (04/20 1138) Pulse Rate:  [68-87] 70 (04/20 1138) Resp:  [15-20] 20 (04/20 1138) BP: (119-141)/(74-99) 124/98 (04/20 1138) SpO2:  [95 %-100 %] 100 % (04/20 1138) FiO2 (%):  [16 %] 16 % (04/19 1922) Weight:  [78.4 kg-79.4 kg] 79.4 kg (04/20 0404) Physical Exam  Constitutional: He is oriented to person, place, and time. He appears well-developed and well-nourished. No distress.  HENT:  Mouth/Throat: Oropharynx is clear and moist. No oropharyngeal exudate.  Cardiovascular: Normal rate, regular rhythm and normal heart sounds.systolic M BH at apex radiating to axilla No murmur heard.  Pulmonary/Chest: Effort normal and breath sounds normal. No respiratory distress. He has no wheezes.  Abdominal: Soft. Bowel sounds are normal. He exhibits no distension. There is no tenderness.  Ext: +1 edema Psychiatric: He has a normal mood and affect. His behavior is normal.     LABS: Results for orders placed or performed during the hospital encounter of 03/19/21 (from the past 48 hour(s))  Heparin level (unfractionated)     Status: Abnormal   Collection Time: 03/19/21  7:48 PM  Result Value Ref Range   Heparin Unfractionated <0.10 (L) 0.30 - 0.70 IU/mL    Comment: (NOTE) The clinical reportable range upper limit is being lowered to >1.10 to align with the FDA approved guidance for the current laboratory assay.  If heparin results are below expected  values, and patient dosage has  been confirmed, suggest follow up testing of antithrombin III levels. Performed at [facility], [place] [address] [place], [place] [address]   Hemoglobin and hematocrit, blood     Status: Abnormal   Collection Time: 03/19/21  8:00 PM  Result Value Ref Range   Hemoglobin 8.7 (L) 13.0 - 17.0 g/dL   HCT 27.0 (L) 39.0 - 52.0 %    Comment: Performed at [facility], [place] [address] [place], [place] [address]  Basic metabolic panel     Status: Abnormal   Collection Time: 03/20/21  4:09 AM  Result Value Ref Range   Sodium 139 135 - 145 mmol/L   Potassium 3.8 3.5 - 5.1 mmol/L   Chloride 102 98 - 111 mmol/L   CO2 29 22 - 32 mmol/L   Glucose, Bld 88 70 - 99 mg/dL    Comment: Glucose reference range applies only to samples taken after fasting for at least 8 hours.   BUN 43 (H) 6 - 20 mg/dL   Creatinine, Ser 2.68 (H) 0.61 - 1.24 mg/dL   Calcium 7.8 (L) 8.9 - 10.3 mg/dL   GFR, Estimated 32 (L) >60 mL/min    Comment: (NOTE) Calculated using the CKD-EPI  Creatinine Equation (2021)    Anion gap 8 5 - 15    Comment: Performed at [facility], [place] [address], [place], [place] [address]  CBC     Status: Abnormal   Collection Time: 03/20/21  4:09 AM  Result Value Ref Range   WBC 5.8 4.0 - 10.5 K/uL   RBC 2.63 (L) 4.22 - 5.81 MIL/uL   Hemoglobin 7.4 (L) 13.0 - 17.0 g/dL   HCT 23.3 (L) 39.0 - 52.0 %   MCV 88.6 80.0 - 100.0 fL   MCH 28.1 26.0 - 34.0 pg   MCHC 31.8 30.0 - 36.0 g/dL   RDW 14.8 11.5 - 15.5 %   Platelets 253 150 - 400 K/uL   nRBC 0.0 0.0 - 0.2 %    Comment: Performed at [facility], [place] [address] [place], [place] [address]  Heparin level (unfractionated)     Status: Abnormal   Collection Time: 03/20/21  4:09 AM  Result Value Ref Range   Heparin Unfractionated 0.20 (L) 0.30 - 0.70 IU/mL    Comment: (NOTE) The clinical reportable range upper limit is being lowered to >1.10 to align with the FDA approved guidance for the  current laboratory assay.  If heparin results are below expected values, and patient dosage has  been confirmed, suggest follow up testing of antithrombin III levels. Performed at [facility], [place] [address] [place], [place] [address]   Type and screen [place]     Status: None   Collection Time: 03/20/21  8:35 AM  Result Value Ref Range   ABO/RH(D) A POS    Antibody Screen NEG    Sample Expiration      03/20/2021,2359 Performed at [facility], [place] [address], [place], [place] [address]   .Cooxemetry Panel (carboxy, met, total hgb, O2 sat)     Status: Abnormal   Collection Time: 03/20/21 11:35 AM  Result Value Ref Range   Total hemoglobin 7.9 (L) 12.0 - 16.0 g/dL   O2 Saturation 63.6 %   Carboxyhemoglobin 1.4 0.5 - 1.5 %   Methemoglobin 0.8 0.0 - 1.5 %    Comment: Performed at [facility], [place] [address], [place], [place] [address]  Type and screen     Status: None   Collection Time: 03/20/21 12:30 PM  Result Value Ref Range   ABO/RH(D) A POS    Antibody Screen NEG    Sample Expiration      03/23/2021,2359 Performed at [facility], [place] [address] [place], [place] [address]     MICRO: 4/16 blood cx NGTD IMAGING: CT ABDOMEN PELVIS WO CONTRAST  Result Date: 03/19/2021 CLINICAL DATA:  Abdominal pain. Renal failure. Lower extremity swelling. EXAM: CT ABDOMEN AND PELVIS WITHOUT CONTRAST TECHNIQUE: Multidetector CT imaging of the abdomen and pelvis was performed following the standard protocol without IV contrast. COMPARISON:  CT scan 03/06/2021 FINDINGS: Lower chest: Small bilateral pleural effusions and bibasilar atelectasis. The heart is normal in size. No pericardial effusion. Hepatobiliary: No hepatic lesions are identified without contrast. No intrahepatic biliary dilatation. The gallbladder is grossly normal. Pancreas: No mass, inflammation or ductal dilatation. Spleen: The spleen is mildly enlarged measuring 14.5 x 13.0 x 9.9  cm. Small low attenuations splenic lesions appears stable. Adrenals/Urinary Tract: Adrenal glands and kidneys are unremarkable. No renal calculi or hydronephrosis. Bladder is unremarkable. Stomach/Bowel: The stomach, duodenum, small bowel and colon are unremarkable. No acute inflammatory changes, mass lesions  or obstructive findings. The terminal ileum is normal. The appendix is normal. Vascular/Lymphatic: The aorta is normal in caliber. No atheroscerlotic calcifications. No mesenteric of retroperitoneal mass or adenopathy. Small scattered lymph nodes are noted. Reproductive: The prostate gland and seminal vesicles are unremarkable. Other: Diffuse mesenteric edema without overt ascites. There is also severe diffuse body wall edema suggesting anasarca. Musculoskeletal: No significant bony findings. 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 IMPRESSION: 1. Small bilateral pleural effusions and bibasilar atelectasis. 2. Severe diffuse body wall edema suggesting anasarca. 3. Mild splenomegaly. 4. Stable small low attenuation splenic lesions. 5. No acute abdominal/pelvic findings, mass lesions or adenopathy. Electronically Signed   By: Marijo Sanes M.D.   On: 03/19/2021 13:58   VAS US DOPPLER PRE CABG  Result Date: 03/20/2021 PREOPERATIVE VASCULAR EVALUATION  Indications:      Pre-MVR. Comparison Study: No prior studies. Performing Technologist: Darlin Coco RDMS,RVT  Examination Guidelines: A complete evaluation includes B-mode imaging, spectral Doppler, color Doppler, and power Doppler as needed of all accessible portions of each vessel. Bilateral testing is considered an integral part of a complete examination. Limited examinations for reoccurring indications may be performed as noted.  Right Carotid Findings: +----------+--------+--------+--------+--------+--------+           PSV cm/sEDV cm/sStenosisDescribeComments +----------+--------+--------+--------+--------+--------+ CCA Prox  141      29                               +----------+--------+--------+--------+--------+--------+ CCA Distal101     24                               +----------+--------+--------+--------+--------+--------+ ICA Prox  50      20                               +----------+--------+--------+--------+--------+--------+ ICA Distal127     56                      tortuous +----------+--------+--------+--------+--------+--------+ ECA       96                                       +----------+--------+--------+--------+--------+--------+ Portions of this table do not appear on this page. +----------+--------+-------+----------------+------------+           PSV cm/sEDV cmsDescribe        Arm Pressure +----------+--------+-------+----------------+------------+ Subclavian142            Multiphasic, WNL             +----------+--------+-------+----------------+------------+ +---------+--------+--+--------+--+---------+ VertebralPSV cm/s58EDV cm/s19Antegrade +---------+--------+--+--------+--+---------+ Left Carotid Findings: +----------+--------+--------+--------+--------+--------+           PSV cm/sEDV cm/sStenosisDescribeComments +----------+--------+--------+--------+--------+--------+ CCA Prox  158     27                               +----------+--------+--------+--------+--------+--------+ CCA Distal103     20                               +----------+--------+--------+--------+--------+--------+ ICA Prox  75      24                               +----------+--------+--------+--------+--------+--------+  ICA Distal96      47                      tortuous +----------+--------+--------+--------+--------+--------+ ECA       86      16                               +----------+--------+--------+--------+--------+--------+ +----------+--------+--------+----------------+------------+ SubclavianPSV cm/sEDV cm/sDescribe        Arm Pressure  +----------+--------+--------+----------------+------------+           136             Multiphasic, WNL             +----------+--------+--------+----------------+------------+ +---------+--------+--+--------+--+---------+ VertebralPSV cm/s71EDV cm/s27Antegrade +---------+--------+--+--------+--+---------+  ABI Findings: +--------+------------------+-----+---------+--------+ Right   Rt Pressure (mmHg)IndexWaveform Comment  +--------+------------------+-----+---------+--------+ Brachial                       triphasicPICC     +--------+------------------+-----+---------+--------+ +--------+------------------+-----+---------+-------+ Left    Lt Pressure (mmHg)IndexWaveform Comment +--------+------------------+-----+---------+-------+ [id number]                    triphasic        +--------+------------------+-----+---------+-------+  Right Doppler Findings: +--------+--------+-----+---------+--------+ Site    PressureIndexDoppler  Comments +--------+--------+-----+---------+--------+ Brachial             triphasicPICC     +--------+--------+-----+---------+--------+ Radial               triphasic         +--------+--------+-----+---------+--------+ Ulnar                triphasic         +--------+--------+-----+---------+--------+  Left Doppler Findings: +--------+--------+-----+---------+--------+ Site    PressureIndexDoppler  Comments +--------+--------+-----+---------+--------+ [id number]          triphasic         +--------+--------+-----+---------+--------+ Radial               triphasic         +--------+--------+-----+---------+--------+ Ulnar                triphasic         +--------+--------+-----+---------+--------+  Summary: Right Carotid: There is no evidence of stenosis in the right ICA. There was no                evidence of thrombus, dissection, atherosclerotic plaque or                stenosis in the cervical  carotid system. Left Carotid: There is no evidence of stenosis in the left ICA. There was no               evidence of thrombus, dissection, atherosclerotic plaque or               stenosis in the cervical carotid system. Vertebrals:  Bilateral vertebral arteries demonstrate antegrade flow. Subclavians: Normal flow hemodynamics were seen in bilateral subclavian              arteries. Right Upper Extremity: Doppler waveforms remain within normal limits with right radial compression. Doppler waveforms remain within normal limits with right ulnar compression. Left Upper Extremity: Doppler waveforms remain within normal limits with left radial compression. Doppler waveforms remain within normal limits with left ulnar compression.     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 c/b  post infectious GN related AKI, splenic infarct, roughly 2 weeks into abtx regimen with worsening valvular function  -being evaluated by surgery for urgent valve replacement - continue with ceftriaxone plus ampicillin, would restart abtx clock at time of surgery for addn 6 wk - please send tissue for culture at time of valve replacement  Post infectious GN = will discuss with renal how long to taper  Acute on chronic HFpeF= currently remains on torsemide 197m po daily and carvedilol, may improve post valve repain  Anemia = received blood transfusion yesterday, but already trending downward somewhat, -also has been on heparin for DVT  DVT RUE = on heparin, patient is asymptomatic, agree with cardiology need to weigh risk/benefits of anticoagulation and bleeding risk

## 2021-03-20 NOTE — Anesthesia Preprocedure Evaluation (Addendum)
Anesthesia Evaluation  Patient identified by MRN, date of birth, ID band Patient awake    Reviewed: Allergy & Precautions, H&P , NPO status , Patient's Chart, lab work & pertinent test results  Airway Mallampati: II  TM Distance: >3 FB Neck ROM: Full    Dental no notable dental hx. (+) Teeth Intact, Dental Advisory Given   Pulmonary Current Smoker and Patient abstained from smoking.,    Pulmonary exam normal breath sounds clear to auscultation       Cardiovascular Exercise Tolerance: Good +CHF  + Valvular Problems/Murmurs MR  Rhythm:Regular Rate:Normal     Neuro/Psych  Headaches, negative psych ROS   GI/Hepatic negative GI ROS, Neg liver ROS,   Endo/Other  negative endocrine ROS  Renal/GU negative Renal ROS  negative genitourinary   Musculoskeletal   Abdominal   Peds  Hematology  (+) Blood dyscrasia, anemia ,   Anesthesia Other Findings   Reproductive/Obstetrics negative OB ROS                            Anesthesia Physical Anesthesia Plan  ASA: IV  Anesthesia Plan: General   Post-op Pain Management:    Induction: Intravenous  PONV Risk Score and Plan: 1 and Midazolam and Ondansetron  Airway Management Planned: Oral ETT  Additional Equipment: Arterial line, CVP, PA Cath, TEE, 3D TEE and Ultrasound Guidance Line Placement  Intra-op Plan:   Post-operative Plan: Post-operative intubation/ventilation  Informed Consent: I have reviewed the patients History and Physical, chart, labs and discussed the procedure including the risks, benefits and alternatives for the proposed anesthesia with the patient or authorized representative who has indicated his/her understanding and acceptance.     Dental advisory given  Plan Discussed with: CRNA  Anesthesia Plan Comments:         Anesthesia Quick Evaluation

## 2021-03-20 NOTE — Consult Note (Signed)
Cardiology Consultation:   Patient ID: Jerome Mccormick MRN: [id number]; DOB: 1991/07/23  Admit date: 03/19/2021 Date of Consult: 03/20/2021  PCP:  Patient, No Pcp Per (Inactive)   [facility]  Cardiologist:  Debbe Odea, MD  Advanced Practice Provider:  No care team member to display Electrophysiologist:  None  [id number]} Patient Profile:   Jerome Mccormick is a 30 y.o. 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 Rhona Leavens.  History of Present Illness:   Jerome Mccormick is a 30 yo male with no significant PMH prior to admission.  He was seen on 03/02/2021 for moderate MR in the setting of bacteremia with acute renal failure felt to be secondary to Enterococcus.  Echocardiogram during that admission showed an EF of 60 to 65% with no regional wall motion abnormality, RVSP 38 mmHg, mild left atrial enlargement and abnormal mitral valve with prolapse posterior leaflet.  Echo was unable to exclude partially flail leaflet or torn chordae.  Underwent TEE on 4/6 which showed ruptured posterior chordae of the mitral valve, mild to moderate MR and prolapse of both leaflets of the mitral valve.  There was no clear evidence of endocarditis identified on TEE.  It was felt the etiology of that reported a was related to mitral valve prolapse or endocarditis as the patient was bacteremic at the time.  Surgical consultation was recommended for mitral valve repair/replacement.  It was felt this could be done in the outpatient setting as the patient was hemodynamically stable with no signs of heart failure.  He was scheduled to see Dr. Cornelius Moras as an outpatient on 4/20.   After being discharged he developed worsening shortness of breath and dyspnea on  exertion with bilateral lower extremity edema.  He was readmitted to [facility] on 4/16 with acute on chronic heart failure with worsening renal function.  He was also found to have a PICC line associated right upper extremity DVT and started on IV heparin.  Repeat echocardiogram on 4/18 showed normal EF with continued evidence of ruptured posterior chordae, moderate to severe mitral valve regurgitation and mild holosystolic prolapse of both leaflets of the mitral valve.  He was also evaluated by nephrology in the setting of acute kidney injury with nephrotic syndrome.  He was continued on torsemide along with IV steroids.  Also noted to be anemic with a hemoglobin down to 6.6.  CT abdomen pelvis was negative for retroperitoneal bleed. He was transfused 1 unit PRBCs and IV iron. Transferred to [facility] for further management with plans for TCTS to evaluate here for mitral valve surgery tomorrow.   Past Medical History:  Diagnosis Date  . Allergy   . 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 Headache   . Postinfectious glomerulonephritis     Past Surgical History:  Procedure Laterality Date  . TEE WITHOUT CARDIOVERSION N/A 03/06/2021   Procedure: TRANSESOPHAGEAL ECHOCARDIOGRAM (TEE);  Surgeon: Debbe Odea, MD;  Location: [facility] ORS;  Service: Cardiovascular;  Laterality: N/A;     Home Medications:  Prior to Admission medications   Medication Sig Start Date End Date Taking? Authorizing Provider  acetaminophen (TYLENOL) 325 MG tablet Take 2 tablets (650 mg total) by mouth every 6 (six) hours as needed for mild pain, moderate pain, fever or headache (or Fever >/=  101). 03/11/21   Charise Killian, MD  ampicillin IVPB Inject 8 g into the vein continuous. Infuse ampicillin 8gm daily over 24h as continuous infusion Indication: Enterococcal endocarditis First Dose: Yes Last Day of Therapy:  04/16/2021 Labs - Once weekly:  CBC/D, CMP, and CRP  Method of administration: Ambulatory Pump  (Continuous Infusion) Method of administration may be changed at the discretion of home infusion pharmacist based upon assessment of the patient and/or caregiver's ability to self-administer the medication ordered. 03/11/21 04/16/21  Charise Killian, MD  carvedilol (COREG) 6.25 MG tablet Take 1 tablet (6.25 mg total) by mouth 2 (two) times daily with a meal. 03/19/21   Marrion Coy, MD  cefTRIAXone (ROCEPHIN) IVPB Inject 2 g into the vein every 12 (twelve) hours. Indication: Enterococcal endocarditis First Dose: Yes Last Day of Therapy:  04/16/2021 Labs - Once weekly:  CBC/D, CMP, and CRP  Method of administration: IV Push Method of administration may be changed at the discretion of home infusion pharmacist based upon assessment of the patient and/or caregiver's ability to self-administer the medication ordered. 03/11/21 04/16/21  Charise Killian, MD  heparin 16109 UT/250ML infusion Inject 1,800 Units/hr into the vein continuous. 03/19/21   Marrion Coy, MD  methylPREDNISolone sodium succinate (SOLU-MEDROL) 125 mg/2 mL injection Inject 2 mLs (125 mg total) into the vein daily. 03/20/21   Marrion Coy, MD  nicotine (NICODERM CQ - DOSED IN MG/24 HOURS) 14 mg/24hr patch Place 1 patch (14 mg total) onto the skin daily. 03/19/21   Marrion Coy, MD  torsemide (DEMADEX) 100 MG tablet Take 1 tablet (100 mg total) by mouth daily. 03/19/21   Marrion Coy, MD  traZODone (DESYREL) 50 MG tablet Take 0.5 tablets (25 mg total) by mouth at bedtime as needed for sleep. 03/19/21   Marrion Coy, MD    Inpatient Medications: Scheduled Meds: . bisacodyl  5 mg Oral Once  . carvedilol  6.25 mg Oral BID WC  . chlorhexidine  60 mL Topical Once   And  . [START ON 03/21/2021] chlorhexidine  60 mL Topical Once  . [START ON 03/21/2021] chlorhexidine  15 mL Mouth/Throat Once  . Chlorhexidine Gluconate Cloth  6 each Topical Daily  . [START ON 03/21/2021] epinephrine  0-10 mcg/min Intravenous To OR  . [START ON 03/21/2021]  glutaraldehyde   Topical On Call to OR  . [START ON 03/21/2021] heparin-papaverine-plasmalyte irrigation   Irrigation To OR  . [START ON 03/21/2021] insulin   Intravenous To OR  . [place] Blood Cardioplegia vial (lidocaine/magnesium/mannitol 0.26g-4g-6.4g)   Intracoronary Once  . methylPREDNISolone sodium succinate  125 mg Intravenous Q24H  . nicotine  14 mg Transdermal Daily  . [START ON 03/21/2021] phenylephrine  30-200 mcg/min Intravenous To OR  . [START ON 03/21/2021] potassium chloride  80 mEq Other To OR  . sodium chloride flush  3 mL Intravenous Q12H  . torsemide  100 mg Oral Daily  . [START ON 03/21/2021] tranexamic acid  15 mg/kg Intravenous To OR  . [START ON 03/21/2021] tranexamic acid  2 mg/kg Intracatheter To OR  . [START ON 03/21/2021] vancomycin 1000 mg in NS (1000 ml) irrigation for Dr. Cornelius Moras case   Irrigation To OR   Continuous Infusions: . ampicillin (OMNIPEN) IV 2 g (03/20/21 1056)  . cefTRIAXone (ROCEPHIN)  IV 2 g (03/20/21 1045)  . [START ON 03/21/2021] cefUROXime (ZINACEF)  IV    . [START ON 03/21/2021] cefUROXime (ZINACEF)  IV    . [START ON 03/21/2021] dexmedetomidine    . [  START ON 03/21/2021] heparin 30,000 units/NS 1000 mL solution for CELLSAVER    . [START ON 03/21/2021] milrinone    . [START ON 03/21/2021] nitroGLYCERIN    . [START ON 03/21/2021] norepinephrine    . [START ON 03/21/2021] tranexamic acid (CYKLOKAPRON) infusion ([facility])    . [START ON 03/21/2021] vancomycin     PRN Meds: acetaminophen **OR** acetaminophen, ondansetron **OR** ondansetron (ZOFRAN) IV, polyethylene glycol, senna-docusate, temazepam, traZODone  Allergies:   No Known Allergies  Social History:   Social History   Socioeconomic History  . Marital status: Single    Spouse name: Not on file  . Number of children: 0  . Years of education: 47  . Highest education level: Not on file  Occupational History  . Occupation: Restaurant work  Tobacco Use  . Smoking status: Current Every Day Smoker     Packs/day: 0.75    Types: Cigarettes  . Smokeless tobacco: Never Used  Substance and Sexual Activity  . Alcohol use: Yes    Comment: >100 oz per week - beer  . Drug use: Yes    Types: Marijuana  . Sexual activity: Not on file  Other Topics Concern  . Not on file  Social History Narrative   Sanjiv grew up in [place], [place]. Currently unemployed. He enjoys fixing things around the house. He also enjoys hanging out with friends.   Social Determinants of Health   Financial Resource Strain: Not on file  Food Insecurity: Not on file  Transportation Needs: Not on file  Physical Activity: Not on file  Stress: Not on file  Social Connections: Not on file  Intimate Partner Violence: Not on file    Family History:   History reviewed. No pertinent family history.   ROS:  Please see the history of present illness.   All other ROS reviewed and negative.     Physical Exam/Data:   Vitals:   03/19/21 2353 03/20/21 0404 03/20/21 0736 03/20/21 1138  BP: 119/74 132/88 (!) 131/92 (!) 124/98  Pulse: 79 87 81 70  Resp: Temp: 98.1 F (36.7 C) (!) 97.5 F (36.4 C) 98.3 F (36.8 C) 98.2 F (36.8 C)  TempSrc: Oral Oral Oral Oral  SpO2: 95% 98% 97% 100%  Weight:  79.4 kg    Height:        Intake/Output Summary (Last 24 hours) at 03/20/2021 1649 Last data filed at 03/20/2021 1237 Gross per 24 hour  Intake 1544.25 ml  Output 1325 ml  Net 219.25 ml   Last 3 Weights 03/20/2021 03/19/2021 03/19/2021  Weight (lbs) 175 lb 172 lb 12.8 oz 179 lb 0.2 oz  Weight (kg) 79.379 kg 78.382 kg 81.2 kg     Body mass index is 27.41 kg/m.  General:  Well nourished, well developed, in no acute distress HEENT: normal Lymph: no adenopathy Neck: no JVD Endocrine:  No thryomegaly Vascular: No carotid bruits; FA pulses 2+ bilaterally without bruits  Cardiac:  normal S1, S2; RRR; 3/6 holosystolic murmur  Lungs:  clear to auscultation bilaterally, no wheezing, rhonchi or rales  Abd: soft,  nontender, no hepatomegaly  Ext: mild LE edema bilaterally Musculoskeletal:  No deformities, BUE and BLE strength normal and equal Skin: warm and dry  Neuro:  CNs 2-12 intact, no focal abnormalities noted Psych:  Normal affect   EKG:  The EKG was personally reviewed and demonstrates:  4/16 NSR 98 bpm  Relevant CV Studies:  Echo: 03/18/2021  IMPRESSIONS    1. Left  ventricular ejection fraction, by estimation, is 60 to 65%. The  left ventricle has normal function. The left ventricle has no regional  wall motion abnormalities. The left ventricular internal cavity size was  mildly dilated. Left ventricular  diastolic parameters are indeterminate.  2. Right ventricular systolic function is normal. The right ventricular  size is normal. There is mildly elevated pulmonary artery systolic  pressure. The estimated right ventricular systolic pressure is 44.2 mmHg.  3. Left atrial size was mildly dilated.  4. The pericardial effusion is circumferential.  5. The mitral valve is abnormal. There is evidence of a ruptured  posterior chordae. Moderate to severe mitral valve regurgitation. No  evidence of mitral stenosis. There is mild holosystolic prolapse of both  leaflets of the mitral valve.  6. The aortic valve is normal in structure. Aortic valve regurgitation is  not visualized. No aortic stenosis is present.  7. The inferior vena cava is normal in size with greater than 50%  respiratory variability, suggesting right atrial pressure of 3 mmHg.   Laboratory Data:  High Sensitivity Troponin:   Recent Labs  Lab 03/16/21 0658  TROPONINIHS 8     Chemistry Recent Labs  Lab 03/18/21 0530 03/19/21 0530 03/20/21 0409  NA 140 139 139  K 4.7 4.1 3.8  CL 104 102 102  CO2 26 29 29   GLUCOSE 97 110* 88  BUN 56* 49* 43*  CREATININE 3.13* 2.82* 2.68*  CALCIUM 8.2* 7.9* 7.8*  GFRNONAA 26* 30* 32*  ANIONGAP 10 8 8     Recent Labs  Lab 03/16/21 0658  PROT 6.3*  ALBUMIN 2.6*   AST 27  ALT 23  ALKPHOS 33*  BILITOT 0.5   Hematology Recent Labs  Lab 03/16/21 0658 03/19/21 0530 03/19/21 2000 03/20/21 0409  WBC 8.0 5.8  --  5.8  RBC 3.13* 2.38*  --  2.63*  HGB 8.6* 6.6* 8.7* 7.4*  HCT 27.9* 20.8* 27.0* 23.3*  MCV 89.1 87.4  --  88.6  MCH 27.5 27.7  --  28.1  MCHC 30.8 31.7  --  31.8  RDW 14.9 15.0  --  14.8  PLT 218 222  --  253   BNP Recent Labs  Lab 03/16/21 0658  BNP 570.3*    DDimer No results for input(s): DDIMER in the last 168 hours.   Radiology/Studies:  CT ABDOMEN PELVIS WO CONTRAST  Result Date: 03/19/2021 CLINICAL DATA:  Abdominal pain. Renal failure. Lower extremity swelling. EXAM: CT ABDOMEN AND PELVIS WITHOUT CONTRAST TECHNIQUE: Multidetector CT imaging of the abdomen and pelvis was performed following the standard protocol without IV contrast. COMPARISON:  CT scan 03/06/2021 FINDINGS: Lower chest: Small bilateral pleural effusions and bibasilar atelectasis. The heart is normal in size. No pericardial effusion. Hepatobiliary: No hepatic lesions are identified without contrast. No intrahepatic biliary dilatation. The gallbladder is grossly normal. Pancreas: No mass, inflammation or ductal dilatation. Spleen: The spleen is mildly enlarged measuring 14.5 x 13.0 x 9.9 cm. Small low attenuations splenic lesions appears stable. Adrenals/Urinary Tract: Adrenal glands and kidneys are unremarkable. No renal calculi or hydronephrosis. Bladder is unremarkable. Stomach/Bowel: The stomach, duodenum, small bowel and colon are unremarkable. No acute inflammatory changes, mass lesions or obstructive findings. The terminal ileum is normal. The appendix is normal. Vascular/Lymphatic: The aorta is normal in caliber. No atheroscerlotic calcifications. No mesenteric of retroperitoneal mass or adenopathy. Small scattered lymph nodes are noted. Reproductive: The prostate gland and seminal vesicles are unremarkable. Other: Diffuse mesenteric edema without overt  ascites. There is  also severe diffuse body wall edema suggesting anasarca. Musculoskeletal: No significant bony findings. 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 IMPRESSION: 1. Small bilateral pleural effusions and bibasilar atelectasis. 2. Severe diffuse body wall edema suggesting anasarca. 3. Mild splenomegaly. 4. Stable small low attenuation splenic lesions. 5. No acute abdominal/pelvic findings, mass lesions or adenopathy. Electronically Signed   By: Rudie Meyer M.D.   On: 03/19/2021 13:58   US Venous Img Upper Uni Right(DVT)  Result Date: 03/17/2021 CLINICAL DATA:  Pain and swelling for 1 day EXAM: RIGHT UPPER EXTREMITY VENOUS DOPPLER ULTRASOUND TECHNIQUE: Gray-scale sonography with graded compression, as well as color Doppler and duplex ultrasound were performed to evaluate the upper extremity deep venous system from the level of the subclavian vein and including the jugular, axillary, basilic, radial, ulnar and upper cephalic vein. Spectral Doppler was utilized to evaluate flow at rest and with distal augmentation maneuvers. COMPARISON:  None. FINDINGS: Contralateral Subclavian Vein: Respiratory phasicity is normal and symmetric with the symptomatic side. No evidence of thrombus. Normal compressibility. Internal Jugular Vein: No evidence of thrombus. Normal compressibility, respiratory phasicity and response to augmentation. Subclavian Vein: No evidence of thrombus. Normal compressibility, respiratory phasicity and response to augmentation. Axillary Vein: No evidence of thrombus. Normal compressibility, respiratory phasicity and response to augmentation. Cephalic Vein: Partially occlusive thrombus noted in the distal upper arm. Basilic Vein: No evidence of thrombus. Normal compressibility, respiratory phasicity and response to augmentation. Brachial Veins: Small amount of nonocclusive thrombus noted in the right brachial vein around the PICC. Radial Veins: No evidence of thrombus. Normal  compressibility, respiratory phasicity and response to augmentation. Ulnar Veins: No evidence of thrombus. Normal compressibility, respiratory phasicity and response to augmentation. Venous Reflux:  None visualized. Other Findings:  None visualized. IMPRESSION: 1. Small amount of nonocclusive thrombus noted in the distal upper arm cephalic vein. 2. Small amount of nonocclusive thrombus noted adjacent to PICC in the brachial vein in the mid upper arm. Electronically Signed   By: Acquanetta Belling M.D.   On: 03/17/2021 08:51   ECHOCARDIOGRAM COMPLETE  Result Date: 03/18/2021    ECHOCARDIOGRAM REPORT   Patient Name:   Jerome Mccormick Date of Exam: 03/18/2021 Medical Rec #:  [id number]      Height:       67.0 in Accession #:    [id number]     Weight:       179.8 lb Date of Birth:  [birth year]      BSA:          1.933 m Patient Age:    30 years       BP:           130/92 mmHg Patient Gender: M              HR:           78 bpm. Exam Location:  [facility] Procedure: 2D Echo, Color Doppler and Cardiac Doppler Indications:     I34.0 Mitral valve insufficiency  History:         Patient has prior history of Echocardiogram examinations, most                  recent 03/06/2021. Mitral Valve Disease.  Sonographer:     Humphrey Rolls RDCS (AE) Referring Phys:  [id number] Lennon Alstrom Diagnosing Phys: Lorine Bears MD IMPRESSIONS  1. Left ventricular ejection fraction, by estimation, is 60 to 65%. The left ventricle has normal function. The left ventricle has no regional  wall motion abnormalities. The left ventricular internal cavity size was mildly dilated. Left ventricular diastolic parameters are indeterminate.  2. Right ventricular systolic function is normal. The right ventricular size is normal. There is mildly elevated pulmonary artery systolic pressure. The estimated right ventricular systolic pressure is 44.2 mmHg.  3. Left atrial size was mildly dilated.  4. The pericardial effusion is circumferential.  5. The mitral valve is  abnormal. 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 Moderate to severe mitral valve regurgitation. No evidence of mitral stenosis. 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  6. The aortic valve is normal in structure. Aortic valve regurgitation is not visualized. No aortic stenosis is present.  7. The inferior vena cava is normal in size with greater than 50% respiratory variability, suggesting right atrial pressure of 3 mmHg. FINDINGS  Left Ventricle: Left ventricular ejection fraction, by estimation, is 60 to 65%. The left ventricle has normal function. The left ventricle has no regional wall motion abnormalities. The left ventricular internal cavity size was mildly dilated. There is  no left ventricular hypertrophy. Left ventricular diastolic parameters are indeterminate. Right Ventricle: The right ventricular size is normal. No increase in right ventricular wall thickness. Right ventricular systolic function is normal. There is mildly elevated pulmonary artery systolic pressure. The tricuspid regurgitant velocity is 3.21  m/s, and with an assumed right atrial pressure of 3 mmHg, the estimated right ventricular systolic pressure is 44.2 mmHg. Left Atrium: Left atrial size was mildly dilated. Right Atrium: Right atrial size was normal in size. Pericardium: Trivial pericardial effusion is present. The pericardial effusion is circumferential. Mitral Valve: The mitral valve is abnormal. 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 There is moderate thickening of the mitral valve leaflet(s). Moderate to severe mitral valve regurgitation. No evidence of mitral valve stenosis. MV peak gradient, 6.2 mmHg. The mean mitral valve gradient is 4.0 mmHg. Tricuspid Valve: The tricuspid valve is normal in structure. Tricuspid valve regurgitation is mild . No evidence of tricuspid stenosis. Aortic Valve: The aortic valve is normal in structure. Aortic valve  regurgitation is not visualized. No aortic stenosis is present. Aortic valve mean gradient measures 4.0 mmHg. Aortic valve peak gradient measures 8.0 mmHg. Aortic valve area, by VTI measures 3.03 cm. Pulmonic Valve: The pulmonic valve was normal in structure. Pulmonic valve regurgitation is mild. No evidence of pulmonic stenosis. Aorta: The aortic root is normal in size and structure. Venous: The inferior vena cava is normal in size with greater than 50% respiratory variability, suggesting right atrial pressure of 3 mmHg. IAS/Shunts: No atrial level shunt detected by color flow Doppler.  LEFT VENTRICLE PLAX 2D LVIDd:         5.30 cm  Diastology LVIDs:         3.50 cm  LV e' medial:    9.46 cm/s LV PW:         1.00 cm  LV E/e' medial:  14.3 LV IVS:        0.80 cm  LV e' lateral:   16.80 cm/s LVOT diam:     2.10 cm  LV E/e' lateral: 8.0 LV SV:         83 LV SV Index:   43 LVOT Area:     3.46 cm  RIGHT VENTRICLE RV Basal diam:  2.80 cm TAPSE (M-mode): 3.4 cm LEFT ATRIUM             Index  RIGHT ATRIUM           Index LA diam:        4.00 cm 2.07 cm/m  RA Area:     15.10 cm LA Vol (A2C):   55.5 ml 28.72 ml/m RA Volume:   31.80 ml  16.45 ml/m LA Vol (A4C):   73.5 ml 38.03 ml/m LA Biplane Vol: 63.9 ml 33.06 ml/m  AORTIC VALVE                   PULMONIC VALVE AV Area (Vmax):    2.95 cm    PV Vmax:       0.85 m/s AV Area (Vmean):   2.94 cm    PV Vmean:      58.000 cm/s AV Area (VTI):     3.03 cm    PV VTI:        0.204 m AV Vmax:           141.00 cm/s PV Peak grad:  2.9 mmHg AV Vmean:          99.400 cm/s PV Mean grad:  1.0 mmHg AV VTI:            0.273 m AV Peak Grad:      8.0 mmHg AV Mean Grad:      4.0 mmHg LVOT Vmax:         120.00 cm/s LVOT Vmean:        84.300 cm/s LVOT VTI:          0.239 m LVOT/AV VTI ratio: 0.88  AORTA Ao Root diam: 3.30 cm MITRAL VALVE                TRICUSPID VALVE MV Area (PHT): 4.29 cm     TR Peak grad:   41.2 mmHg MV Area VTI:   2.71 cm     TR Vmax:        321.00 cm/s MV Peak  grad:  6.2 mmHg MV Mean grad:  4.0 mmHg     SHUNTS MV Vmax:       1.25 m/s     Systemic VTI:  0.24 m MV Vmean:      90.6 cm/s    Systemic Diam: 2.10 cm MV Decel Time: 177 msec MV E velocity: 135.00 cm/s MV A velocity: 95.40 cm/s MV E/A ratio:  1.42 Lorine Bears MD Electronically signed by Lorine Bears MD Signature Date/Time: 03/18/2021/2:50:06 PM    Final    VAS US DOPPLER PRE CABG  Result Date: 03/20/2021 PREOPERATIVE VASCULAR EVALUATION  Indications:      Pre-MVR. Comparison Study: No prior studies. Performing Technologist: Jean Rosenthal RDMS,RVT  Examination Guidelines: A complete evaluation includes B-mode imaging, spectral Doppler, color Doppler, and power Doppler as needed of all accessible portions of each vessel. Bilateral testing is considered an integral part of a complete examination. Limited examinations for reoccurring indications may be performed as noted.  Right Carotid Findings: +----------+--------+--------+--------+--------+--------+           PSV cm/sEDV cm/sStenosisDescribeComments +----------+--------+--------+--------+--------+--------+ CCA Prox  141     29                               +----------+--------+--------+--------+--------+--------+ CCA Distal101     24                               +----------+--------+--------+--------+--------+--------+ ICA  Prox  50      20                               +----------+--------+--------+--------+--------+--------+ ICA Distal127     56                      tortuous +----------+--------+--------+--------+--------+--------+ ECA       96                                       +----------+--------+--------+--------+--------+--------+ Portions of this table do not appear on this page. +----------+--------+-------+----------------+------------+           PSV cm/sEDV cmsDescribe        Arm Pressure +----------+--------+-------+----------------+------------+ Subclavian142            Multiphasic, WNL              +----------+--------+-------+----------------+------------+ +---------+--------+--+--------+--+---------+ VertebralPSV cm/s58EDV cm/s19Antegrade +---------+--------+--+--------+--+---------+ Left Carotid Findings: +----------+--------+--------+--------+--------+--------+           PSV cm/sEDV cm/sStenosisDescribeComments +----------+--------+--------+--------+--------+--------+ CCA Prox  158     27                               +----------+--------+--------+--------+--------+--------+ CCA Distal103     20                               +----------+--------+--------+--------+--------+--------+ ICA Prox  75      24                               +----------+--------+--------+--------+--------+--------+ ICA Distal96      47                      tortuous +----------+--------+--------+--------+--------+--------+ ECA       86      16                               +----------+--------+--------+--------+--------+--------+ +----------+--------+--------+----------------+------------+ SubclavianPSV cm/sEDV cm/sDescribe        Arm Pressure +----------+--------+--------+----------------+------------+           136             Multiphasic, WNL             +----------+--------+--------+----------------+------------+ +---------+--------+--+--------+--+---------+ VertebralPSV cm/s71EDV cm/s27Antegrade +---------+--------+--+--------+--+---------+  ABI Findings: +--------+------------------+-----+---------+--------+ Right   Rt Pressure (mmHg)IndexWaveform Comment  +--------+------------------+-----+---------+--------+ Brachial                       triphasicPICC     +--------+------------------+-----+---------+--------+ +--------+------------------+-----+---------+-------+ Left    Lt Pressure (mmHg)IndexWaveform Comment +--------+------------------+-----+---------+-------+ [id number]                    triphasic         +--------+------------------+-----+---------+-------+  Right Doppler Findings: +--------+--------+-----+---------+--------+ Site    PressureIndexDoppler  Comments +--------+--------+-----+---------+--------+ Brachial             triphasicPICC     +--------+--------+-----+---------+--------+ Radial               triphasic         +--------+--------+-----+---------+--------+ Ulnar  triphasic         +--------+--------+-----+---------+--------+  Left Doppler Findings: +--------+--------+-----+---------+--------+ Site    PressureIndexDoppler  Comments +--------+--------+-----+---------+--------+ [id number]          triphasic         +--------+--------+-----+---------+--------+ Radial               triphasic         +--------+--------+-----+---------+--------+ Ulnar                triphasic         +--------+--------+-----+---------+--------+  Summary: Right Carotid: There is no evidence of stenosis in the right ICA. There was no                evidence of thrombus, dissection, atherosclerotic plaque or                stenosis in the cervical carotid system. Left Carotid: There is no evidence of stenosis in the left ICA. There was no               evidence of thrombus, dissection, atherosclerotic plaque or               stenosis in the cervical carotid system. Vertebrals:  Bilateral vertebral arteries demonstrate antegrade flow. Subclavians: Normal flow hemodynamics were seen in bilateral subclavian              arteries. Right Upper Extremity: Doppler waveforms remain within normal limits with right radial compression. Doppler waveforms remain within normal limits with right ulnar compression. Left Upper Extremity: Doppler waveforms remain within normal limits with left radial compression. Doppler waveforms remain within normal limits with left ulnar compression.  Electronically signed by Heath Lark on 03/20/2021 at 4:34:25 PM.    Final      Assessment  and Plan:   Jerome Mccormick is a 30 y.o. 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 Rhona Leavens.  1. Acute on chronic HFpEF with mitral regurgitation with ruptures posterior chordae/possible endocarditis: initial plan for outpatient TCTS evaluation but presented back with progressive DOE and shortness of breath. He was diuresed aggressively at [facility] with significant improvement. Was transitioned from IV lasix to torsemide -- currently on torsemide 100 mg daily, along with coreg 6.25mg  BID -- TCTS evaluation today with plans for mitral valve surgery tomorrow -- receiving antibiotics per ID recommendations  2. AKI with nephrotic syndrome: followed by nephrology at [facility] -- torsemide 100mg  daily -- Cr improving, currently 2.68 -- dosed with solu-medrol 125mg  x3 per nephrology  3. Acute on chronic anemia: Hgb down to 6.6 yesterday with 1 unit PRBCs and IV iron. Increased to 8.7, then down again to 7.4 -- CT abd/pelvis was negative for acute bleed  4. Upper extremity DVT 2/2 PICC: IV heparin while at [facility], but now with continued anemia appears this has been held which seems reasonable with small DVT.  Risk Assessment/Risk Scores:   New [place] Heart Association (NYHA) Functional Class NYHA Class IV   For questions or updates, please contact [facility] Please consult [email or web address] for contact info under    Signed, , NP  03/20/2021 4:49 PM

## 2021-03-20 NOTE — Progress Notes (Signed)
PROGRESS NOTE    Jerome Mccormick  AQT:[id number] DOB: 22-May-1991 DOA: 03/19/2021 PCP: Patient, No Pcp Per (Inactive)    Brief Narrative:  30 y.o. male with medical history significant for recent diagnosis of enterococcal endocarditis who has been on IV antibiotics (ceftriaxone 2 g IV every 12 hours and ampicillin 8 g IV daily for 6 weeks, last dose 04/16/2021), postinfectious glomerulonephritis who was readmitted to [facility] on 03/16/2021 for acute on chronic HFpEF and worsening renal function.  He was found to have a PICC line associated RUE DVT and started on heparin. 03/18/2021 showed moderate to severe mitral valve regurgitation with evidence of ruptured posterior chordae and prolapse of both leaflets of the mitral valve.  Cardiology evaluated patient and recommended transfer to [facility] for cardiac surgery consultation  Assessment & Plan:   Principal Problem:   Mitral valve prolapse Active Problems:   AKI (acute kidney injury) (HCC)   Anemia   Arm DVT (deep venous thromboembolism), acute, right (HCC)   Acute on chronic heart failure with preserved ejection fraction (HFpEF) (HCC)  Acute on chronic HFpEF Severe mitral regurgitation, prolapse, ruptured posterior coronary: TTE 4/18 showed EF 60-65% with mitral valve findings as above.  -Appears to be compensated at this time -Continue Coreg 6.25 mg twice daily -Continue torsemide 100 mg daily -Have consulted CTS who has recommended surgical intervention -Had since consulted Cardiology to follow -ID consulted  Enterococcus endocarditis: -Enterococcus growing on blood cultures 03/05/2021.  -TEE performed 4/6 showed mitral valve changes -Pt is continued on ceftriaxone 2 g IV every 12 hours and ampicillin 8 g IV daily for 6 weeks, last dose 04/16/2021 -ID consulted  Acute kidney injury due to postinfectious glomerulonephritis: -Pt was seen by nephrology while at [facility].   -Recommendation was for pulse IV Solu-Medrol 125 mg  daily x3 days (received first dose 4/19) followed by 6-8 weeks of continued steroids. -Currently on solumedrol  PICC line associated RUE DVT: -Continued on heparin gtt  Symptomatic iron deficiency anemia: -Hemoglobin 6.6 4/19 AM.  Received 1 unit PRBC transfusion and IV iron at time of presentation with appropriate correction -Cont to follow CBC trends  DVT prophylaxis: Heparin gtt Code Status: Full Family Communication: Pt in room, family is at bedside  Status is: Inpatient  Remains inpatient appropriate because:IV treatments appropriate due to intensity of illness or inability to take PO and Inpatient level of care appropriate due to severity of illness   Dispo: The patient is from: Home              Anticipated d/c is to: Home              Patient currently is not medically stable to d/c.   Difficult to place patient No       Consultants:   CT Surgery  Cardiology  ID  Procedures:     Antimicrobials: Anti-infectives (From admission, onward)   Start     Dose/Rate Route Frequency Ordered Stop   03/21/21 0400  vancomycin (VANCOREADY) IVPB 1250 mg/250 mL        1,250 mg 166.7 mL/hr over 90 Minutes Intravenous To Surgery 03/20/21 1018 03/22/21 0400   03/21/21 0400  cefUROXime (ZINACEF) 1.5 g in sodium chloride 0.9 % 100 mL IVPB        1.5 g 200 mL/hr over 30 Minutes Intravenous To Surgery 03/20/21 1018 03/22/21 0400   03/21/21 0400  cefUROXime (ZINACEF) 750 mg in sodium chloride 0.9 % 100 mL IVPB  750 mg 200 mL/hr over 30 Minutes Intravenous To Surgery 03/20/21 1018 03/22/21 0400   03/21/21 0400  vancomycin (VANCOCIN) 1,000 mg in sodium chloride 0.9 % 1,000 mL irrigation         Irrigation To Surgery 03/20/21 1018 03/22/21 0400   03/19/21 2200  ampicillin (OMNIPEN) 2 g in sodium chloride 0.9 % 100 mL IVPB        2 g 300 mL/hr over 20 Minutes Intravenous Every 6 hours 03/19/21 1945     03/19/21 2200  cefTRIAXone (ROCEPHIN) 2 g in sodium chloride 0.9 % 100  mL IVPB        2 g 200 mL/hr over 30 Minutes Intravenous Every 12 hours 03/19/21 1945         Subjective: Feeling anxious about needing surgery  Objective: Vitals:   03/19/21 2353 03/20/21 0404 03/20/21 0736 03/20/21 1138  BP: 119/74 132/88 (!) 131/92 (!) 124/98  Pulse: 79 87 81 70  Resp: 15 16 16 20   Temp: 98.1 F (36.7 C) (!) 97.5 F (36.4 C) 98.3 F (36.8 C) 98.2 F (36.8 C)  TempSrc: Oral Oral Oral Oral  SpO2: 95% 98% 97% 100%  Weight:  79.4 kg    Height:        Intake/Output Summary (Last 24 hours) at 03/20/2021 1624 Last data filed at 03/20/2021 1237 Gross per 24 hour  Intake 1544.25 ml  Output 1325 ml  Net 219.25 ml   Filed Weights   03/19/21 1810 03/20/21 0404  Weight: 78.4 kg 79.4 kg    Examination: General exam: Awake, laying in bed, in nad Respiratory system: Normal respiratory effort, no wheezing Cardiovascular system: regular rate, s1, s2, systolic murmur Gastrointestinal system: Soft, nondistended, positive BS Central nervous system: CN2-12 grossly intact, strength intact Extremities: Perfused, no clubbing Skin: Normal skin turgor, no notable skin lesions seen Psychiatry: Mood normal // no visual hallucinations   Data Reviewed: I have personally reviewed following labs and imaging studies  CBC: Recent Labs  Lab 03/16/21 0658 03/19/21 0530 03/19/21 2000 03/20/21 0409  WBC 8.0 5.8  --  5.8  NEUTROABS 6.0  --   --   --   HGB 8.6* 6.6* 8.7* 7.4*  HCT 27.9* 20.8* 27.0* 23.3*  MCV 89.1 87.4  --  88.6  PLT 218 222  --  253   Basic Metabolic Panel: Recent Labs  Lab 03/16/21 0658 03/17/21 0550 03/18/21 0530 03/19/21 0530 03/20/21 0409  NA 138 138 140 139 139  K 5.7* 5.3* 4.7 4.1 3.8  CL 107 102 104 102 102  CO2 23 25 26 29 29   GLUCOSE 97 95 97 110* 88  BUN 69* 64* 56* 49* 43*  CREATININE 3.26* 3.32* 3.13* 2.82* 2.68*  CALCIUM 8.9 8.4* 8.2* 7.9* 7.8*  MG  --   --   --  1.9  --    GFR: Estimated Creatinine Clearance: 40.7 mL/min (A)  (by C-G formula based on SCr of 2.68 mg/dL (H)). Liver Function Tests: Recent Labs  Lab 03/16/21 0658  AST 27  ALT 23  ALKPHOS 33*  BILITOT 0.5  PROT 6.3*  ALBUMIN 2.6*   No results for input(s): LIPASE, AMYLASE in the last 168 hours. No results for input(s): AMMONIA in the last 168 hours. Coagulation Profile: No results for input(s): INR, PROTIME in the last 168 hours. Cardiac Enzymes: No results for input(s): CKTOTAL, CKMB, CKMBINDEX, TROPONINI in the last 168 hours. BNP (last 3 results) No results for input(s): PROBNP in the last 8760  hours. HbA1C: No results for input(s): HGBA1C in the last 72 hours. CBG: No results for input(s): GLUCAP in the last 168 hours. Lipid Profile: No results for input(s): CHOL, HDL, LDLCALC, TRIG, CHOLHDL, LDLDIRECT in the last 72 hours. Thyroid Function Tests: No results for input(s): TSH, T4TOTAL, FREET4, T3FREE, THYROIDAB in the last 72 hours. Anemia Panel: Recent Labs    03/18/21 1501  VITAMINB12 581  TIBC 232*  IRON 26*   Sepsis Labs: No results for input(s): PROCALCITON, LATICACIDVEN in the last 168 hours.  Recent Results (from the past 240 hour(s))  Resp Panel by RT-PCR (Flu A&B, Covid) Nasopharyngeal Swab     Status: None   Collection Time: 03/16/21  7:02 AM   Specimen: Nasopharyngeal Swab; Nasopharyngeal(NP) swabs in vial transport medium  Result Value Ref Range Status   SARS Coronavirus 2 by RT PCR NEGATIVE NEGATIVE Final    Comment: (NOTE) SARS-CoV-2 target nucleic acids are NOT DETECTED.  The SARS-CoV-2 RNA is generally detectable in upper respiratory specimens during the acute phase of infection. The lowest concentration of SARS-CoV-2 viral copies this assay can detect is 138 copies/mL. A negative result does not preclude SARS-Cov-2 infection and should not be used as the sole basis for treatment or other patient management decisions. A negative result may occur with  improper specimen collection/handling, submission of  specimen other than nasopharyngeal swab, presence of viral mutation(s) within the areas targeted by this assay, and inadequate number of viral copies(<138 copies/mL). A negative result must be combined with clinical observations, patient history, and epidemiological information. The expected result is Negative.  Fact Sheet for Patients:  BloggerCourse.com  Fact Sheet for Healthcare Providers:  SeriousBroker.it  This test is no t yet approved or cleared by the [place] FDA and  has been authorized for detection and/or diagnosis of SARS-CoV-2 by FDA under an Emergency Use Authorization (EUA). This EUA will remain  in effect (meaning this test can be used) for the duration of the COVID-19 declaration under Section 564(b)(1) of the Act, 21 U.S.C.section 360bbb-3(b)(1), unless the authorization is terminated  or revoked sooner.       Influenza A by PCR NEGATIVE NEGATIVE Final   Influenza B by PCR NEGATIVE NEGATIVE Final    Comment: (NOTE) The Xpert Xpress SARS-CoV-2/FLU/RSV plus assay is intended as an aid in the diagnosis of influenza from Nasopharyngeal swab specimens and should not be used as a sole basis for treatment. Nasal washings and aspirates are unacceptable for Xpert Xpress SARS-CoV-2/FLU/RSV testing.  Fact Sheet for Patients: BloggerCourse.com  Fact Sheet for Healthcare Providers: SeriousBroker.it  This test is not yet approved or cleared by the [place] FDA and has been authorized for detection and/or diagnosis of SARS-CoV-2 by FDA under an Emergency Use Authorization (EUA). This EUA will remain in effect (meaning this test can be used) for the duration of the COVID-19 declaration under Section 564(b)(1) of the Act, 21 U.S.C. section 360bbb-3(b)(1), unless the authorization is terminated or revoked.  Performed at [facility], [address] [place], [place] [address]   Culture, blood (routine x 2)     Status: None (Preliminary result)   Collection Time: 03/16/21  2:07 PM   Specimen: BLOOD  Result Value Ref Range Status   Specimen Description BLOOD BLOOD LEFT ARM  Final   Special Requests   Final    BOTTLES DRAWN AEROBIC AND ANAEROBIC Blood Culture adequate volume   Culture   Final    NO GROWTH 4 DAYS Performed at  [facility], [address], [place], [place] [address]    Report Status PENDING  Incomplete  Culture, blood (routine x 2)     Status: None (Preliminary result)   Collection Time: 03/16/21  2:07 PM   Specimen: BLOOD  Result Value Ref Range Status   Specimen Description BLOOD BLOOD LEFT HAND  Final   Special Requests   Final    BOTTLES DRAWN AEROBIC AND ANAEROBIC Blood Culture adequate volume   Culture   Final    NO GROWTH 4 DAYS Performed at [facility], [address] [place], [place] [address]    Report Status PENDING  Incomplete     Radiology Studies: CT ABDOMEN PELVIS WO CONTRAST  Result Date: 03/19/2021 CLINICAL DATA:  Abdominal pain. Renal failure. Lower extremity swelling. EXAM: CT ABDOMEN AND PELVIS WITHOUT CONTRAST TECHNIQUE: Multidetector CT imaging of the abdomen and pelvis was performed following the standard protocol without IV contrast. COMPARISON:  CT scan 03/06/2021 FINDINGS: Lower chest: Small bilateral pleural effusions and bibasilar atelectasis. The heart is normal in size. No pericardial effusion. Hepatobiliary: No hepatic lesions are identified without contrast. No intrahepatic biliary dilatation. The gallbladder is grossly normal. Pancreas: No mass, inflammation or ductal dilatation. Spleen: The spleen is mildly enlarged measuring 14.5 x 13.0 x 9.9 cm. Small low attenuations splenic lesions appears stable. Adrenals/Urinary Tract: Adrenal glands and kidneys are unremarkable. No renal calculi or hydronephrosis. Bladder is unremarkable. Stomach/Bowel: The stomach,  duodenum, small bowel and colon are unremarkable. No acute inflammatory changes, mass lesions or obstructive findings. The terminal ileum is normal. The appendix is normal. Vascular/Lymphatic: The aorta is normal in caliber. No atheroscerlotic calcifications. No mesenteric of retroperitoneal mass or adenopathy. Small scattered lymph nodes are noted. Reproductive: The prostate gland and seminal vesicles are unremarkable. Other: Diffuse mesenteric edema without overt ascites. There is also severe diffuse body wall edema suggesting anasarca. Musculoskeletal: No significant bony findings. 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 IMPRESSION: 1. Small bilateral pleural effusions and bibasilar atelectasis. 2. Severe diffuse body wall edema suggesting anasarca. 3. Mild splenomegaly. 4. Stable small low attenuation splenic lesions. 5. No acute abdominal/pelvic findings, mass lesions or adenopathy. Electronically Signed   By: Rudie Meyer M.D.   On: 03/19/2021 13:58   VAS US DOPPLER PRE CABG  Result Date: 03/20/2021 PREOPERATIVE VASCULAR EVALUATION  Indications:      Pre-MVR. Comparison Study: No prior studies. Performing Technologist: Jean Rosenthal RDMS,RVT  Examination Guidelines: A complete evaluation includes B-mode imaging, spectral Doppler, color Doppler, and power Doppler as needed of all accessible portions of each vessel. Bilateral testing is considered an integral part of a complete examination. Limited examinations for reoccurring indications may be performed as noted.  Right Carotid Findings: +----------+--------+--------+--------+--------+--------+           PSV cm/sEDV cm/sStenosisDescribeComments +----------+--------+--------+--------+--------+--------+ CCA Prox  141     29                               +----------+--------+--------+--------+--------+--------+ CCA Distal101     24                               +----------+--------+--------+--------+--------+--------+ ICA  Prox  50      20                               +----------+--------+--------+--------+--------+--------+  ICA Distal127     56                      tortuous +----------+--------+--------+--------+--------+--------+ ECA       96                                       +----------+--------+--------+--------+--------+--------+ Portions of this table do not appear on this page. +----------+--------+-------+----------------+------------+           PSV cm/sEDV cmsDescribe        Arm Pressure +----------+--------+-------+----------------+------------+ Subclavian142            Multiphasic, WNL             +----------+--------+-------+----------------+------------+ +---------+--------+--+--------+--+---------+ VertebralPSV cm/s58EDV cm/s19Antegrade +---------+--------+--+--------+--+---------+ Left Carotid Findings: +----------+--------+--------+--------+--------+--------+           PSV cm/sEDV cm/sStenosisDescribeComments +----------+--------+--------+--------+--------+--------+ CCA Prox  158     27                               +----------+--------+--------+--------+--------+--------+ CCA Distal103     20                               +----------+--------+--------+--------+--------+--------+ ICA Prox  75      24                               +----------+--------+--------+--------+--------+--------+ ICA Distal96      47                      tortuous +----------+--------+--------+--------+--------+--------+ ECA       86      16                               +----------+--------+--------+--------+--------+--------+ +----------+--------+--------+----------------+------------+ SubclavianPSV cm/sEDV cm/sDescribe        Arm Pressure +----------+--------+--------+----------------+------------+           136             Multiphasic, WNL             +----------+--------+--------+----------------+------------+  +---------+--------+--+--------+--+---------+ VertebralPSV cm/s71EDV cm/s27Antegrade +---------+--------+--+--------+--+---------+  ABI Findings: +--------+------------------+-----+---------+--------+ Right   Rt Pressure (mmHg)IndexWaveform Comment  +--------+------------------+-----+---------+--------+ Brachial                       triphasicPICC     +--------+------------------+-----+---------+--------+ +--------+------------------+-----+---------+-------+ Left    Lt Pressure (mmHg)IndexWaveform Comment +--------+------------------+-----+---------+-------+ [id number]                    triphasic        +--------+------------------+-----+---------+-------+  Right Doppler Findings: +--------+--------+-----+---------+--------+ Site    PressureIndexDoppler  Comments +--------+--------+-----+---------+--------+ Brachial             triphasicPICC     +--------+--------+-----+---------+--------+ Radial               triphasic         +--------+--------+-----+---------+--------+ Ulnar                triphasic         +--------+--------+-----+---------+--------+  Left Doppler Findings: +--------+--------+-----+---------+--------+ Site    PressureIndexDoppler  Comments +--------+--------+-----+---------+--------+ [id number]  triphasic         +--------+--------+-----+---------+--------+ Radial               triphasic         +--------+--------+-----+---------+--------+ Ulnar                triphasic         +--------+--------+-----+---------+--------+  Summary: Right Carotid: There is no evidence of stenosis in the right ICA. There was no                evidence of thrombus, dissection, atherosclerotic plaque or                stenosis in the cervical carotid system. Left Carotid: There is no evidence of stenosis in the left ICA. There was no               evidence of thrombus, dissection, atherosclerotic plaque or                stenosis in the cervical carotid system. Vertebrals:  Bilateral vertebral arteries demonstrate antegrade flow. Subclavians: Normal flow hemodynamics were seen in bilateral subclavian              arteries. Right Upper Extremity: Doppler waveforms remain within normal limits with right radial compression. Doppler waveforms remain within normal limits with right ulnar compression. Left Upper Extremity: Doppler waveforms remain within normal limits with left radial compression. Doppler waveforms remain within normal limits with left ulnar compression.     Preliminary     Scheduled Meds: . bisacodyl  5 mg Oral Once  . carvedilol  6.25 mg Oral BID WC  . chlorhexidine  60 mL Topical Once   And  . [START ON 03/21/2021] chlorhexidine  60 mL Topical Once  . [START ON 03/21/2021] chlorhexidine  15 mL Mouth/Throat Once  . Chlorhexidine Gluconate Cloth  6 each Topical Daily  . [START ON 03/21/2021] epinephrine  0-10 mcg/min Intravenous To OR  . [START ON 03/21/2021] glutaraldehyde   Topical On Call to OR  . [START ON 03/21/2021] heparin-papaverine-plasmalyte irrigation   Irrigation To OR  . [START ON 03/21/2021] insulin   Intravenous To OR  . [place] Blood Cardioplegia vial (lidocaine/magnesium/mannitol 0.26g-4g-6.4g)   Intracoronary Once  . methylPREDNISolone sodium succinate  125 mg Intravenous Q24H  . nicotine  14 mg Transdermal Daily  . [START ON 03/21/2021] phenylephrine  30-200 mcg/min Intravenous To OR  . [START ON 03/21/2021] potassium chloride  80 mEq Other To OR  . sodium chloride flush  3 mL Intravenous Q12H  . torsemide  100 mg Oral Daily  . [START ON 03/21/2021] tranexamic acid  15 mg/kg Intravenous To OR  . [START ON 03/21/2021] tranexamic acid  2 mg/kg Intracatheter To OR  . [START ON 03/21/2021] vancomycin 1000 mg in NS (1000 ml) irrigation for Dr. Cornelius Moras case   Irrigation To OR   Continuous Infusions: . ampicillin (OMNIPEN) IV 2 g (03/20/21 1056)  . cefTRIAXone (ROCEPHIN)  IV 2 g (03/20/21  1045)  . [START ON 03/21/2021] cefUROXime (ZINACEF)  IV    . [START ON 03/21/2021] cefUROXime (ZINACEF)  IV    . [START ON 03/21/2021] dexmedetomidine    . [START ON 03/21/2021] heparin 30,000 units/NS 1000 mL solution for CELLSAVER    . [START ON 03/21/2021] milrinone    . [START ON 03/21/2021] nitroGLYCERIN    . [START ON 03/21/2021] norepinephrine    . [START ON 03/21/2021] tranexamic acid (CYKLOKAPRON) infusion ([facility])    . [  START ON 03/21/2021] vancomycin        LOS: 1 day   Rickey BarbaraStephen Marae Cottrell, MD [facility] Pager On Amion  If 7PM-7AM, please contact night-coverage 03/20/2021, 4:24 PM

## 2021-03-20 NOTE — Consult Note (Addendum)
[address]       [place] [address]             [phone]        Jerome Mccormick [facility] Medical Record #[id number] Date of Birth: 1991-05-17  Referring: No ref. 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  History of Present Illness:    The patient is a 30 year old male who presented originally to [facility] with chief complaint of severe headaches and sinus congestion.he had fevers as high as 102.   He attempted multiple over-the-counter anti-inflammatory medications to try to manage headaches.  He also noted a rash on the back of his calves.  He reported some blood-tinged sputum and did have an associated cough.  He also had some nausea and vomiting as well as fevers and night sweats.  He reported a approximate 20 pound weight loss in the last few months.  He did noted some facial edema and lower extremity edema as well.  He denies IV drug abuse.  He does use occasional marijuana.  He does not have a notable history of significant alcohol use and tobacco abuse.  On admission he was noted to have findings consistent with acute renal failure with a BUN of 73 and creatinine of 4.35.  BUN and creatinine peaked on 03/06/2021 at 79/5.0.  Most recent BUN and creatinine dated 03/20/2021 are 43/2.68.  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  He is also found to have hematuria and hypertension.  Nephrology consultation was obtained and full  diagnostic evaluation  included kidney biopsy for which findings of glomerulonephritis were noted.  It was also felt that there could be some impact on this as related to heavy nonsteroidal anti-inflammatory use.  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  Infectious disease and cardiology consultations were  obtained.  Cultures have grown Enterococcus.  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  At that time there was no clear evidence for endocarditis identified.  Physical examination findings consistent with acute on chronic diastolic heart failure.  Originally an outpatient cardiothoracic surgical appointment was made to see Dr. Cornelius Moras for consideration of valve repair/replacement but following initial discharge he developed worsening shortness of breath/dyspnea on exertion as well as increasing lower extremity edema requiring re-admission..  At the time of his original discharge he was on IV antibiotics at home as well as torsemide for diuresis.  Repeat TEE done 03/18/2021 showed severe MR and patient was transferred to [facility] for CT surgery evaluation as an inpatient.  Patient also has a right arm DVT from PICC line.  He developed a worsening anemia and iron deficiency and has received packed blood cells and iron infusion.  He has no signs of melena and had no signs of retroperitoneal bleed on CT scan.  Most recent abdominal CT scan did not show splenic enlargement as well as small low attenuated splenic lesions.  It also had findings consistent with anasarca and small bilateral pleural effusions.  Full report of the CT scan as well as most recent echocardiogram are listed below.  The patient is currently on intravenous ampicillin  and ceftriaxone with plan to duration until 04/16/2021. 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   Current Activity/ Functional Status: Patient is independent with mobility/ambulation, transfers, ADL's, IADL's.   Zubrod Score: At the time of surgery this patient's most  appropriate activity status/level should be described as:     0    Normal activity, no symptoms     1    Restricted in physical strenuous activity but ambulatory, able to do out light work     2    Ambulatory and capable of self care, unable to do work activities, up and about                 more than 50%  Of the time                                3    Only limited self care, in bed greater than 50% of waking hours     4    Completely disabled, no self care, confined to bed or chair     5    Moribund  Past Medical History:  Diagnosis Date  . Allergy   . Headache   . Postinfectious glomerulonephritis     Past Surgical History:  Procedure Laterality Date  . TEE WITHOUT CARDIOVERSION N/A 03/06/2021   Procedure: TRANSESOPHAGEAL ECHOCARDIOGRAM (TEE);  Surgeon: Debbe Odea, MD;  Location: [facility] ORS;  Service: Cardiovascular;  Laterality: N/A;    Social History   Tobacco Use  Smoking Status Current Every Day Smoker  . Packs/day: 0.75  . Types: Cigarettes  Smokeless Tobacco Never Used    Social History   Substance and Sexual Activity  Alcohol Use Yes   Comment: >100 oz per week - beer     No Known Allergies  Current Facility-Administered Medications  Medication Dose Route Frequency Provider Last Rate Last Admin  . acetaminophen (TYLENOL) tablet 650 mg  650 mg Oral Q6H PRN Charlsie Quest, MD   650 mg at 03/19/21 2129   Or  . acetaminophen (TYLENOL) suppository 650 mg  650 mg Rectal Q6H PRN Darreld Mclean R, MD      . ampicillin (OMNIPEN) 2 g in sodium chloride 0.9 % 100 mL IVPB  2 g Intravenous Q6H Ann Held, RPH 300 mL/hr at 03/20/21 0358 2 g at 03/20/21 0358  . carvedilol (COREG) tablet 6.25 mg  6.25 mg Oral BID WC Patel, Vishal R, MD      . cefTRIAXone (ROCEPHIN) 2 g in sodium chloride 0.9 % 100 mL IVPB  2 g Intravenous Q12H Ann Held, RPH 200 mL/hr at 03/19/21 2134 2 g at 03/19/21 2134  . Chlorhexidine Gluconate Cloth 2 % PADS 6 each  6  each Topical Daily Jerald Kief, MD      . heparin ADULT infusion 100 units/mL (25000 units/272mL)  2,300 Units/hr Intravenous Continuous Charlsie Quest, MD 23 mL/hr at 03/20/21 0454 2,300 Units/hr at 03/20/21 0454  . methylPREDNISolone sodium succinate (SOLU-MEDROL) 125 mg/2 mL injection 125 mg  125 mg Intravenous Q24H Patel, Vishal R, MD      . nicotine (NICODERM CQ - dosed in mg/24 hours) patch 14 mg  14 mg Transdermal Daily Charlsie Quest, MD   14 mg at 03/19/21 2129  . ondansetron (ZOFRAN) tablet 4 mg  4 mg Oral Q6H  PRN Charlsie Quest, MD       Or  . ondansetron [facility]) injection 4 mg  4 mg Intravenous Q6H PRN Charlsie Quest, MD   4 mg at 03/20/21 0408  . senna-docusate (Senokot-S) tablet 1 tablet  1 tablet Oral QHS PRN Darreld Mclean R, MD      . sodium chloride flush (NS) 0.9 % injection 3 mL  3 mL Intravenous Q12H Darreld Mclean R, MD   3 mL at 03/19/21 2135  . torsemide (DEMADEX) tablet 100 mg  100 mg Oral Daily Darreld Mclean R, MD      . traZODone (DESYREL) tablet 25 mg  25 mg Oral QHS PRN Charlsie Quest, MD   25 mg at 03/19/21 2227    Medications Prior to Admission  Medication Sig Dispense Refill Last Dose  . acetaminophen (TYLENOL) 325 MG tablet Take 2 tablets (650 mg total) by mouth every 6 (six) hours as needed for mild pain, moderate pain, fever or headache (or Fever >/= 101).     Marland Kitchen ampicillin IVPB Inject 8 g into the vein continuous. Infuse ampicillin 8gm daily over 24h as continuous infusion Indication: Enterococcal endocarditis First Dose: Yes Last Day of Therapy:  04/16/2021 Labs - Once weekly:  CBC/D, CMP, and CRP  Method of administration: Ambulatory Pump (Continuous Infusion) Method of administration may be changed at the discretion of home infusion pharmacist based upon assessment of the patient and/or caregiver's ability to self-administer the medication ordered. 37 Units 0   . carvedilol (COREG) 6.25 MG tablet Take 1 tablet (6.25 mg total) by mouth 2 (two) times  daily with a meal.     . cefTRIAXone (ROCEPHIN) IVPB Inject 2 g into the vein every 12 (twelve) hours. Indication: Enterococcal endocarditis First Dose: Yes Last Day of Therapy:  04/16/2021 Labs - Once weekly:  CBC/D, CMP, and CRP  Method of administration: IV Push Method of administration may be changed at the discretion of home infusion pharmacist based upon assessment of the patient and/or caregiver's ability to self-administer the medication ordered. 71 Units 0   . heparin [address] UT/250ML infusion Inject 1,800 Units/hr into the vein continuous.     . methylPREDNISolone sodium succinate (SOLU-MEDROL) 125 mg/2 mL injection Inject 2 mLs (125 mg total) into the vein daily. 1 each 0   . nicotine (NICODERM CQ - DOSED IN MG/24 HOURS) 14 mg/24hr patch Place 1 patch (14 mg total) onto the skin daily. 28 patch 0   . torsemide (DEMADEX) 100 MG tablet Take 1 tablet (100 mg total) by mouth daily.     . traZODone (DESYREL) 50 MG tablet Take 0.5 tablets (25 mg total) by mouth at bedtime as needed for sleep.       No family history on file.   Review of Systems:   Review of Systems  Constitutional: Positive for chills, diaphoresis, fever, malaise/fatigue and weight loss.  HENT: Positive for congestion, nosebleeds and sinus pain. Negative for ear discharge, ear pain, hearing loss, sore throat and tinnitus.   Eyes: Positive for blurred vision. Negative for double vision, photophobia, pain, discharge and redness.  Respiratory: Positive for cough, sputum production and shortness of breath. Negative for hemoptysis, wheezing and stridor.   Cardiovascular: Positive for chest pain, palpitations and leg swelling. Negative for orthopnea, claudication and PND.  Gastrointestinal: Positive for blood in stool, constipation, heartburn, nausea and vomiting. Negative for abdominal pain, diarrhea and melena.  Genitourinary: Positive for hematuria. Negative for dysuria, flank pain, frequency and urgency.  Musculoskeletal:  Positive for myalgias and neck pain. Negative for back pain, falls and joint pain.  Skin: Positive for rash. Negative for itching.       Lower leg rash- since resolved  Neurological: Positive for dizziness and headaches. Negative for tingling, tremors, sensory change, speech change, focal weakness, seizures, loss of consciousness and weakness.  Endo/Heme/Allergies: Negative for environmental allergies and polydipsia. Does not bruise/bleed easily.  Psychiatric/Behavioral: Positive for depression and hallucinations. Negative for memory loss, substance abuse and suicidal ideas. The patient is nervous/anxious and has insomnia.         Physical Exam: BP (!) 131/92 (BP Location: Left Arm)   Pulse 81   Temp 98.3 F (36.8 C) (Oral)   Resp 16   Ht  (1.702 m)   Wt 79.4 kg   SpO2 97%   BMI 27.41 kg/m    Physical Exam  Constitutional: He appears healthy. No distress.  HENT:  Nose: Nose normal.  Mouth/Throat: No dental caries. Oropharynx is clear. Pharynx is normal.  Eyes: Pupils are equal, round, and reactive to light. Conjunctivae are normal.  Neck: Thyroid normal. No JVD present. No neck adenopathy. No thyromegaly present.  Cardiovascular: Normal rate, regular rhythm, S1 normal, S2 normal and intact distal pulses. Exam reveals no gallop.  Murmur heard.  Medium-pitched blowing holosystolic murmur is present with a grade of 3/6. No bruits  Pulmonary/Chest: Effort normal and breath sounds normal. He has no wheezes. He has no rales. He exhibits no tenderness.  Abdominal: Soft. Bowel sounds are normal. He exhibits no distension and no mass. There is no hepatomegaly. There is no abdominal tenderness.  Musculoskeletal:        General: Edema present. No tenderness or deformity. Normal range of motion.     Cervical back: Normal range of motion and neck supple.  Neurological: He is alert and oriented to person, place, and time. He has normal motor skills.  Skin: Skin is warm and dry. No rash  noted. No cyanosis. No jaundice or pallor. Nails show no clubbing.   Diagnostic Studies & Laboratory data:     Recent Radiology Findings:   CT ABDOMEN PELVIS WO CONTRAST  Result Date: 03/19/2021 CLINICAL DATA:  Abdominal pain. Renal failure. Lower extremity swelling. EXAM: CT ABDOMEN AND PELVIS WITHOUT CONTRAST TECHNIQUE: Multidetector CT imaging of the abdomen and pelvis was performed following the standard protocol without IV contrast. COMPARISON:  CT scan 03/06/2021 FINDINGS: Lower chest: Small bilateral pleural effusions and bibasilar atelectasis. The heart is normal in size. No pericardial effusion. Hepatobiliary: No hepatic lesions are identified without contrast. No intrahepatic biliary dilatation. The gallbladder is grossly normal. Pancreas: No mass, inflammation or ductal dilatation. Spleen: The spleen is mildly enlarged measuring 14.5 x 13.0 x 9.9 cm. Small low attenuations splenic lesions appears stable. Adrenals/Urinary Tract: Adrenal glands and kidneys are unremarkable. No renal calculi or hydronephrosis. Bladder is unremarkable. Stomach/Bowel: The stomach, duodenum, small bowel and colon are unremarkable. No acute inflammatory changes, mass lesions or obstructive findings. The terminal ileum is normal. The appendix is normal. Vascular/Lymphatic: The aorta is normal in caliber. No atheroscerlotic calcifications. No mesenteric of retroperitoneal mass or adenopathy. Small scattered lymph nodes are noted. Reproductive: The prostate gland and seminal vesicles are unremarkable. Other: Diffuse mesenteric edema without overt ascites. There is also severe diffuse body wall edema suggesting anasarca. Musculoskeletal: No significant bony findings. 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 IMPRESSION: 1. Small bilateral pleural effusions and bibasilar atelectasis. 2. Severe diffuse body wall  edema suggesting anasarca. 3. Mild splenomegaly. 4. Stable small low attenuation splenic lesions. 5. No  acute abdominal/pelvic findings, mass lesions or adenopathy. Electronically Signed   By: Rudie Meyer M.D.   On: 03/19/2021 13:58   ECHOCARDIOGRAM COMPLETE  Result Date: 03/18/2021    ECHOCARDIOGRAM REPORT   Patient Name:   Jerome Mccormick Date of Exam: 03/18/2021 Medical Rec #:  [id number]      Height:       67.0 in Accession #:    [id number]     Weight:       179.8 lb Date of Birth:  1991-09-22      BSA:          1.933 m Patient Age:    30 years       BP:           130/92 mmHg Patient Gender: M              HR:           78 bpm. Exam Location:  [facility] Procedure: 2D Echo, Color Doppler and Cardiac Doppler Indications:     I34.0 Mitral valve insufficiency  History:         Patient has prior history of Echocardiogram examinations, most                  recent 03/06/2021. Mitral Valve Disease.  Sonographer:     Humphrey Rolls RDCS (AE) Referring Phys:  [id number] Lennon Alstrom Diagnosing Phys: Lorine Bears MD IMPRESSIONS  1. Left ventricular ejection fraction, by estimation, is 60 to 65%. The left ventricle has normal function. The left ventricle has no regional wall motion abnormalities. The left ventricular internal cavity size was mildly dilated. Left ventricular diastolic parameters are indeterminate.  2. Right ventricular systolic function is normal. The right ventricular size is normal. There is mildly elevated pulmonary artery systolic pressure. The estimated right ventricular systolic pressure is 44.2 mmHg.  3. Left atrial size was mildly dilated.  4. The pericardial effusion is circumferential.  5. The mitral valve is abnormal. 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 Moderate to severe mitral valve regurgitation. No evidence of mitral stenosis. 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  6. The aortic valve is normal in structure. Aortic valve regurgitation is not visualized. No aortic stenosis is present.  7. The inferior vena cava is normal in size with greater than  50% respiratory variability, suggesting right atrial pressure of 3 mmHg. FINDINGS  Left Ventricle: Left ventricular ejection fraction, by estimation, is 60 to 65%. The left ventricle has normal function. The left ventricle has no regional wall motion abnormalities. The left ventricular internal cavity size was mildly dilated. There is  no left ventricular hypertrophy. Left ventricular diastolic parameters are indeterminate. Right Ventricle: The right ventricular size is normal. No increase in right ventricular wall thickness. Right ventricular systolic function is normal. There is mildly elevated pulmonary artery systolic pressure. The tricuspid regurgitant velocity is 3.21  m/s, and with an assumed right atrial pressure of 3 mmHg, the estimated right ventricular systolic pressure is 44.2 mmHg. Left Atrium: Left atrial size was mildly dilated. Right Atrium: Right atrial size was normal in size. Pericardium: Trivial pericardial effusion is present. The pericardial effusion is circumferential. Mitral Valve: The mitral valve is abnormal. 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 There is moderate thickening of the mitral valve leaflet(s). Moderate to severe mitral valve  regurgitation. No evidence of mitral valve stenosis. MV peak gradient, 6.2 mmHg. The mean mitral valve gradient is 4.0 mmHg. Tricuspid Valve: The tricuspid valve is normal in structure. Tricuspid valve regurgitation is mild . No evidence of tricuspid stenosis. Aortic Valve: The aortic valve is normal in structure. Aortic valve regurgitation is not visualized. No aortic stenosis is present. Aortic valve mean gradient measures 4.0 mmHg. Aortic valve peak gradient measures 8.0 mmHg. Aortic valve area, by VTI measures 3.03 cm. Pulmonic Valve: The pulmonic valve was normal in structure. Pulmonic valve regurgitation is mild. No evidence of pulmonic stenosis. Aorta: The aortic root is normal in size and structure. Venous: The  inferior vena cava is normal in size with greater than 50% respiratory variability, suggesting right atrial pressure of 3 mmHg. IAS/Shunts: No atrial level shunt detected by color flow Doppler.  LEFT VENTRICLE PLAX 2D LVIDd:         5.30 cm  Diastology LVIDs:         3.50 cm  LV e' medial:    9.46 cm/s LV PW:         1.00 cm  LV E/e' medial:  14.3 LV IVS:        0.80 cm  LV e' lateral:   16.80 cm/s LVOT diam:     2.10 cm  LV E/e' lateral: 8.0 LV SV:         83 LV SV Index:   43 LVOT Area:     3.46 cm  RIGHT VENTRICLE RV Basal diam:  2.80 cm TAPSE (M-mode): 3.4 cm LEFT ATRIUM             Index       RIGHT ATRIUM           Index LA diam:        4.00 cm 2.07 cm/m  RA Area:     15.10 cm LA Vol (A2C):   55.5 ml 28.72 ml/m RA Volume:   31.80 ml  16.45 ml/m LA Vol (A4C):   73.5 ml 38.03 ml/m LA Biplane Vol: 63.9 ml 33.06 ml/m  AORTIC VALVE                   PULMONIC VALVE AV Area (Vmax):    2.95 cm    PV Vmax:       0.85 m/s AV Area (Vmean):   2.94 cm    PV Vmean:      58.000 cm/s AV Area (VTI):     3.03 cm    PV VTI:        0.204 m AV Vmax:           141.00 cm/s PV Peak grad:  2.9 mmHg AV Vmean:          99.400 cm/s PV Mean grad:  1.0 mmHg AV VTI:            0.273 m AV Peak Grad:      8.0 mmHg AV Mean Grad:      4.0 mmHg LVOT Vmax:         120.00 cm/s LVOT Vmean:        84.300 cm/s LVOT VTI:          0.239 m LVOT/AV VTI ratio: 0.88  AORTA Ao Root diam: 3.30 cm MITRAL VALVE                TRICUSPID VALVE MV Area (PHT): 4.29 cm     TR Peak grad:  41.2 mmHg MV Area VTI:   2.71 cm     TR Vmax:        321.00 cm/s MV Peak grad:  6.2 mmHg MV Mean grad:  4.0 mmHg     SHUNTS MV Vmax:       1.25 m/s     Systemic VTI:  0.24 m MV Vmean:      90.6 cm/s    Systemic Diam: 2.10 cm MV Decel Time: 177 msec MV E velocity: 135.00 cm/s MV A velocity: 95.40 cm/s MV E/A ratio:  1.42 Lorine Bears MD Electronically signed by Lorine Bears MD Signature Date/Time: 03/18/2021/2:50:06 PM    Final      I have independently reviewed  the above radiologic studies and discussed with the patient   Recent Lab Findings: Lab Results  Component Value Date   WBC 5.8 03/20/2021   HGB 7.4 (L) 03/20/2021   HCT 23.3 (L) 03/20/2021   PLT 253 03/20/2021   GLUCOSE 88 03/20/2021   CHOL 200 02/23/2014   TRIG 104.0 02/23/2014   HDL 71.80 02/23/2014   LDLCALC 107 (H) 02/23/2014   ALT 23 03/16/2021   AST 27 03/16/2021   NA 139 03/20/2021   K 3.8 03/20/2021   CL 102 03/20/2021   CREATININE 2.68 (H) 03/20/2021   BUN 43 (H) 03/20/2021   CO2 29 03/20/2021   INR 1.3 (H) 03/11/2021   Recent Results (from the past 720 hour(s))  Resp Panel by RT-PCR (Flu A&B, Covid) Nasopharyngeal Swab     Status: None   Collection Time: 03/02/21  4:31 PM   Specimen: Nasopharyngeal Swab; Nasopharyngeal(NP) swabs in vial transport medium  Result Value Ref Range Status   SARS Coronavirus 2 by RT PCR NEGATIVE NEGATIVE Final    Comment: (NOTE) SARS-CoV-2 target nucleic acids are NOT DETECTED.  The SARS-CoV-2 RNA is generally detectable in upper respiratory specimens during the acute phase of infection. The lowest concentration of SARS-CoV-2 viral copies this assay can detect is 138 copies/mL. A negative result does not preclude SARS-Cov-2 infection and should not be used as the sole basis for treatment or other patient management decisions. A negative result may occur with  improper specimen collection/handling, submission of specimen other than nasopharyngeal swab, presence of viral mutation(s) within the areas targeted by this assay, and inadequate number of viral copies(<138 copies/mL). A negative result must be combined with clinical observations, patient history, and epidemiological information. The expected result is Negative.  Fact Sheet for Patients:  BloggerCourse.com  Fact Sheet for Healthcare Providers:  SeriousBroker.it  This test is no t yet approved or cleared by the [place]  FDA and  has been authorized for detection and/or diagnosis of SARS-CoV-2 by FDA under an Emergency Use Authorization (EUA). This EUA will remain  in effect (meaning this test can be used) for the duration of the COVID-19 declaration under Section 564(b)(1) of the Act, 21 U.S.C.section 360bbb-3(b)(1), unless the authorization is terminated  or revoked sooner.       Influenza A by PCR NEGATIVE NEGATIVE Final   Influenza B by PCR NEGATIVE NEGATIVE Final    Comment: (NOTE) The Xpert Xpress SARS-CoV-2/FLU/RSV plus assay is intended as an aid in the diagnosis of influenza from Nasopharyngeal swab specimens and should not be used as a sole basis for treatment. Nasal washings and aspirates are unacceptable for Xpert Xpress SARS-CoV-2/FLU/RSV testing.  Fact Sheet for Patients: BloggerCourse.com  Fact Sheet for Healthcare Providers: SeriousBroker.it  This test is not yet approved or cleared  by the [place] and has been authorized for detection and/or diagnosis of SARS-CoV-2 by FDA under an Emergency Use Authorization (EUA). This EUA will remain in effect (meaning this test can be used) for the duration of the COVID-19 declaration under Section 564(b)(1) of the Act, 21 U.S.C. section 360bbb-3(b)(1), unless the authorization is terminated or revoked.  Performed at [facility], [address] [place], [place] [address]   Group A Strep by PCR     Status: None   Collection Time: 03/05/21  4:46 PM   Specimen: Throat; Sterile Swab  Result Value Ref Range Status   Group A Strep by PCR NOT DETECTED NOT DETECTED Final    Comment: Performed at [facility], [address] [place], [place] [address]  Culture, blood (Routine X 2) w Reflex to ID Panel     Status: Abnormal   Collection Time: 03/05/21  5:46 PM   Specimen: BLOOD  Result Value Ref Range Status   Specimen Description   Final    BLOOD LEFT  ANTECUBITAL Performed at [facility], [address], [place], [place] [address]    Special Requests   Final    BOTTLES DRAWN AEROBIC AND ANAEROBIC Blood Culture adequate volume Performed at [facility], [address] [place], [place] [address]    Culture  Setup Time   Final    GRAM POSITIVE COCCI IN BOTH AEROBIC AND ANAEROBIC BOTTLES CRITICAL VALUE NOTED.  VALUE IS CONSISTENT WITH PREVIOUSLY REPORTED AND CALLED VALUE. Performed at [facility], [address] [place], [place] [address]    Culture (A)  Final    ENTEROCOCCUS FAECALIS SUSCEPTIBILITIES PERFORMED ON PREVIOUS CULTURE WITHIN THE LAST 5 DAYS. Performed at [facility], [address] [place], [place] [address]    Report Status 03/08/2021 FINAL  Final  Culture, blood (Routine X 2) w Reflex to ID Panel     Status: Abnormal   Collection Time: 03/05/21  5:50 PM   Specimen: BLOOD  Result Value Ref Range Status   Specimen Description   Final    BLOOD RIGHT ANTECUBITAL Performed at [facility], [address] [place], [place] [address]    Special Requests   Final    BOTTLES DRAWN AEROBIC AND ANAEROBIC Blood Culture adequate volume Performed at [facility], [address] [place], [place] [address]    Culture  Setup Time   Final    Organism ID to follow GRAM POSITIVE COCCI IN BOTH AEROBIC AND ANAEROBIC BOTTLES CRITICAL RESULT CALLED TO, READ BACK BY AND VERIFIED WITH: SUSAN WATSON AT 1146 03/06/21 SDR Performed at [facility], [address] [place], [place] [address]    Culture ENTEROCOCCUS FAECALIS (A)  Final   Report Status 03/08/2021 FINAL  Final   Organism ID, Bacteria ENTEROCOCCUS FAECALIS  Final      Susceptibility   Enterococcus faecalis - MIC*    AMPICILLIN <=2 SENSITIVE Sensitive     VANCOMYCIN 1 SENSITIVE Sensitive     GENTAMICIN SYNERGY SENSITIVE Sensitive     * ENTEROCOCCUS FAECALIS  Blood Culture ID Panel (Reflexed)     Status:  Abnormal   Collection Time: 03/05/21  5:50 PM  Result Value Ref Range Status   Enterococcus faecalis DETECTED (A) NOT DETECTED Final    Comment: CRITICAL RESULT CALLED TO, READ BACK BY AND VERIFIED WITH:  SUSAN WATSON AT 1146 03/06/21 SDR    Enterococcus Faecium NOT DETECTED NOT DETECTED Final  Listeria monocytogenes NOT DETECTED NOT DETECTED Final   Staphylococcus species NOT DETECTED NOT DETECTED Final   Staphylococcus aureus (BCID) NOT DETECTED NOT DETECTED Final   Staphylococcus epidermidis NOT DETECTED NOT DETECTED Final   Staphylococcus lugdunensis NOT DETECTED NOT DETECTED Final   Streptococcus species NOT DETECTED NOT DETECTED Final   Streptococcus agalactiae NOT DETECTED NOT DETECTED Final   Streptococcus pneumoniae NOT DETECTED NOT DETECTED Final   Streptococcus pyogenes NOT DETECTED NOT DETECTED Final   A.calcoaceticus-baumannii NOT DETECTED NOT DETECTED Final   Bacteroides fragilis NOT DETECTED NOT DETECTED Final   Enterobacterales NOT DETECTED NOT DETECTED Final   Enterobacter cloacae complex NOT DETECTED NOT DETECTED Final   Escherichia coli NOT DETECTED NOT DETECTED Final   Klebsiella aerogenes NOT DETECTED NOT DETECTED Final   Klebsiella oxytoca NOT DETECTED NOT DETECTED Final   Klebsiella pneumoniae NOT DETECTED NOT DETECTED Final   Proteus species NOT DETECTED NOT DETECTED Final   Salmonella species NOT DETECTED NOT DETECTED Final   Serratia marcescens NOT DETECTED NOT DETECTED Final   Haemophilus influenzae NOT DETECTED NOT DETECTED Final   Neisseria meningitidis NOT DETECTED NOT DETECTED Final   Pseudomonas aeruginosa NOT DETECTED NOT DETECTED Final   Stenotrophomonas maltophilia NOT DETECTED NOT DETECTED Final   Candida albicans NOT DETECTED NOT DETECTED Final   Candida auris NOT DETECTED NOT DETECTED Final   Candida glabrata NOT DETECTED NOT DETECTED Final   Candida krusei NOT DETECTED NOT DETECTED Final   Candida parapsilosis NOT DETECTED NOT DETECTED Final    Candida tropicalis NOT DETECTED NOT DETECTED Final   Cryptococcus neoformans/gattii NOT DETECTED NOT DETECTED Final   Vancomycin resistance NOT DETECTED NOT DETECTED Final    Comment: Performed at [facility], [address] [place], [place] [address]  Culture, blood (single) w Reflex to ID Panel     Status: None   Collection Time: 03/07/21  6:31 AM   Specimen: BLOOD  Result Value Ref Range Status   Specimen Description BLOOD  LEFT AC  Final   Special Requests   Final    BOTTLES DRAWN AEROBIC AND ANAEROBIC Blood Culture results may not be optimal due to an excessive volume of blood received in culture bottles   Culture   Final    NO GROWTH 5 DAYS Performed at [facility], [address] [place], [place] [address]    Report Status 03/12/2021 FINAL  Final  CULTURE, BLOOD (ROUTINE X 2) w Reflex to ID Panel     Status: None   Collection Time: 03/09/21  9:44 AM   Specimen: BLOOD  Result Value Ref Range Status   Specimen Description BLOOD LEFT ANTECUBITAL  Final   Special Requests   Final    BOTTLES DRAWN AEROBIC AND ANAEROBIC Blood Culture results may not be optimal due to an excessive volume of blood received in culture bottles   Culture   Final    NO GROWTH 5 DAYS Performed at [facility], [address] [place], [place] [address]    Report Status 03/14/2021 FINAL  Final  CULTURE, BLOOD (ROUTINE X 2) w Reflex to ID Panel     Status: None   Collection Time: 03/09/21  9:46 AM   Specimen: BLOOD  Result Value Ref Range Status   Specimen Description BLOOD RIGHT ARM  Final   Special Requests   Final    BOTTLES DRAWN AEROBIC AND ANAEROBIC Blood Culture adequate volume   Culture   Final    NO GROWTH 5 DAYS  Performed at [facility], [address] [place], [place] [address]    Report Status 03/14/2021 FINAL  Final  Resp Panel by RT-PCR (Flu A&B, Covid) Nasopharyngeal Swab     Status: None   Collection Time: 03/16/21  7:02 AM   Specimen:  Nasopharyngeal Swab; Nasopharyngeal(NP) swabs in vial transport medium  Result Value Ref Range Status   SARS Coronavirus 2 by RT PCR NEGATIVE NEGATIVE Final    Comment: (NOTE) SARS-CoV-2 target nucleic acids are NOT DETECTED.  The SARS-CoV-2 RNA is generally detectable in upper respiratory specimens during the acute phase of infection. The lowest concentration of SARS-CoV-2 viral copies this assay can detect is 138 copies/mL. A negative result does not preclude SARS-Cov-2 infection and should not be used as the sole basis for treatment or other patient management decisions. A negative result may occur with  improper specimen collection/handling, submission of specimen other than nasopharyngeal swab, presence of viral mutation(s) within the areas targeted by this assay, and inadequate number of viral copies(<138 copies/mL). A negative result must be combined with clinical observations, patient history, and epidemiological information. The expected result is Negative.  Fact Sheet for Patients:  BloggerCourse.com  Fact Sheet for Healthcare Providers:  SeriousBroker.it  This test is no t yet approved or cleared by the [place] FDA and  has been authorized for detection and/or diagnosis of SARS-CoV-2 by FDA under an Emergency Use Authorization (EUA). This EUA will remain  in effect (meaning this test can be used) for the duration of the COVID-19 declaration under Section 564(b)(1) of the Act, 21 U.S.C.section 360bbb-3(b)(1), unless the authorization is terminated  or revoked sooner.       Influenza A by PCR NEGATIVE NEGATIVE Final   Influenza B by PCR NEGATIVE NEGATIVE Final    Comment: (NOTE) The Xpert Xpress SARS-CoV-2/FLU/RSV plus assay is intended as an aid in the diagnosis of influenza from Nasopharyngeal swab specimens and should not be used as a sole basis for treatment. Nasal washings and aspirates are unacceptable for  Xpert Xpress SARS-CoV-2/FLU/RSV testing.  Fact Sheet for Patients: BloggerCourse.com  Fact Sheet for Healthcare Providers: SeriousBroker.it  This test is not yet approved or cleared by the [place] FDA and has been authorized for detection and/or diagnosis of SARS-CoV-2 by FDA under an Emergency Use Authorization (EUA). This EUA will remain in effect (meaning this test can be used) for the duration of the COVID-19 declaration under Section 564(b)(1) of the Act, 21 U.S.C. section 360bbb-3(b)(1), unless the authorization is terminated or revoked.  Performed at [facility], [address] [place], [place] [address]   Culture, blood (routine x 2)     Status: None (Preliminary result)   Collection Time: 03/16/21  2:07 PM   Specimen: BLOOD  Result Value Ref Range Status   Specimen Description BLOOD BLOOD LEFT ARM  Final   Special Requests   Final    BOTTLES DRAWN AEROBIC AND ANAEROBIC Blood Culture adequate volume   Culture   Final    NO GROWTH 4 DAYS Performed at [facility], [address] [place], [place] [address]    Report Status PENDING  Incomplete  Culture, blood (routine x 2)     Status: None (Preliminary result)   Collection Time: 03/16/21  2:07 PM   Specimen: BLOOD  Result Value Ref Range Status   Specimen Description BLOOD BLOOD LEFT HAND  Final   Special Requests   Final    BOTTLES DRAWN AEROBIC AND ANAEROBIC Blood Culture  adequate volume   Culture   Final    NO GROWTH 4 DAYS Performed at [facility], [address] [place], [place] [address]    Report Status PENDING  Incomplete     Assessment / Plan: Bacteremia identified as Enterococcus faecalis Severe mitral valve regurgitation 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 Acute renal insufficiency due to postinfectious glomerular nephritis and NSAID usage Nephrotic syndrome level proteinuria Iron deficiency anemia Diastolic  congestive heart failure,HFpEF Tobacco abuse Right arm DVT   Plan: mitral valve repair/replacement    I  spent 55 minutes counseling the patient face to face.     Rowe ClackWayne E Gold, PA-C 03/20/2021 9:19 AM   I have seen and examined the patient and agree with the assessment and plan as outlined above by Gershon CraneWayne Gold, PA-C.  Patient is a 30 year old male with no previous cardiac history and specifically no history of heart murmur or diagnosis of mitral valve prolapse who presented earlier this month with febrile illness complicated by acute renal failure and volume overload.  Blood cultures are positive for Enterococcus faecalis and both transthoracic and transesophageal echocardiograms were performed confirming the presence of bacterial endocarditis.  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  The patient was initially discharged with home IV antibiotics but readmitted to the hospital with class IV congestive heart failure and volume overload.  Follow-up transthoracic echocardiogram performed March 18, 2021 revealed severe mitral regurgitation.  The patient was transferred to [facility] for [facility] management.  I have personally reviewed the patient's multiple previous echocardiograms as well as his previous radiographic tests and diagnostic work-up.  Both transthoracic and previous transesophageal echocardiograms document the presence of obvious vegetations on the mitral valve with findings consistent with acute bacterial endocarditis.  There is also an obvious perforation of the posterior leaflet which may have been partially contained at the time of the patient's previous limited TEE but clearly is now completely flail with severe mitral regurgitation.  There does not appear to be any sign of annular abscess  or other complications and the patient has remained in sinus rhythm.  Previous CT scan of the abdomen confirmed the presence of septic embolization to the spleen.  Clinically the patient has defervesced on antibiotic therapy and white blood count is normal.  The patient does have severe anemia.  Renal function has gradually improved although his creatinine remains severely elevated at 2.68.  Patient continues to experience migraine headaches.  Previous CT of the head was unrevealing but MRI has not been performed.  Finally, the original portal of entry for Enterococcus species remains unclear.  The patient denies any history of intravenous drug use or problems with bladder or bowel function.  He has not seen a dentist in many years.  I agree the patient needs surgical intervention I favor proceeding with mitral valve repair or replacement as soon as practical.  Given the development of severe acute mitral regurgitation his condition may only deteriorate without definitive surgical intervention.  I remain optimistic that his mitral valve may be repairable although there is a significant possibility valve replacement will be necessary.  The patient will obviously remain at high risk for the development of acute renal failure following surgery.  Because of the patient's very young age and the absence of any associated risk factors for coronary artery disease I do not feel that preoperative  coronary angiography or gated CT angiogram of the heart are necessary.  Moreover, avoiding the need for IV contrast seems more prudent.  The patient, his mother and his grandmother were counseled at length regarding his diagnosis of bacterial endocarditis with severe primary mitral regurgitation complicated by acute diastolic congestive heart failure as well as septic embolization.  We reviewed the results of his diagnostic tests including images from the most recent echocardiograms.  We discussed the natural history of mitral  regurgitation as well as alternative treatment strategies.  We discussed the impact of his young age, current state of health, and any significant comorbid medical problems on clinical decision making.  We went on to discuss the indications, risks and potential benefits of mitral valve repair or replacement as well as the timing of surgical intervention.  The rationale for urgent surgical intervention has been explained, including a comparison between surgery and continued medical therapy with close follow-up.  The likelihood of successful and durable mitral valve repair has been discussed with particular reference to the findings of the most recent echocardiogram.  Based upon these findings and previous experience, I have quoted a greater than 75 percent likelihood of successful valve repair with less than 2 percent risk of mortality or major morbidity.  Alternative surgical approaches have been discussed including a comparison between conventional sternotomy and minimally-invasive techniques.   In the event that the patient's valve cannot be successfully repaired, we discussed the possibility of replacing the mitral valve using a mechanical prosthesis with the attendant need for long-term anticoagulation using warfarin versus the alternative of replacing it using a bioprosthetic tissue valve with its potential for late structural valve deterioration and failure, depending upon the patient's longevity.  The patient specifically requests that if the mitral valve must be replaced that it be done using a mechanical valve.   We plan to proceed with surgery first thing tomorrow morning.  The patient will undergo orthopantogram but we will defer a formal dental service consult until after he has recovered from surgery.  We will check a follow-up chest x-ray given the finding of mild congestive heart failure on his most recent chest x-ray performed at [facility].  Finally, we will obtain MRI of the brain for completeness to  make sure there is no sign of septic embolization to the brain.  The patient understands and accepts all potential risks of surgery including but not limited to risk of death, stroke or other neurologic complication, myocardial infarction, congestive heart failure, respiratory failure, renal failure, bleeding requiring transfusion and/or reexploration, arrhythmia, heart block or bradycardia requiring permanent pacemaker insertion, infection or other wound complications, pneumonia, pleural and/or pericardial effusion, pulmonary embolus, aortic dissection or other major vascular complication, or other immediate or delayed complications related to valve repair or replacement including but not limited to recurrent or persistent mitral regurgitation and/or mitral stenosis, LV outflow tract obstruction, aortic insufficiency, paravalvular leak, posterior AV groove disruption, structural valve deterioration and failure, thrombosis, embolization, or endocarditis.  Specific risks potentially related to the minimally-invasive approach were discussed at length, including but not limited to risk of conversion to full or partial sternotomy, aortic dissection or other major vascular complication, unilateral acute lung injury or pulmonary edema, phrenic nerve dysfunction or paralysis, rib fracture, chronic pain, lung hernia, or lymphocele. All of his questions have been answered.   I spent in excess of 90 minutes during the conduct of this hospital encounter and >50% of this time involved direct face-to-face encounter with the patient for counseling  and/or coordination of their care.    Purcell Nails, MD 03/20/2021 4:23 PM

## 2021-03-20 NOTE — Progress Notes (Signed)
Discussed IS, sternal precautions, mobility post op, and d/c planning with pt and family. Mother will be with him at d/c. 1750 mL on IS. Gave materials. Pt overwhelmed. [id number] Ethelda Chick CES, ACSM 2:44 PM 03/20/2021

## 2021-03-21 ENCOUNTER — Encounter (HOSPITAL_COMMUNITY)
Admission: AD | Disposition: A | Discharge status: Home Health | Payer: Self-pay | Source: Other Acute Inpatient Hospital | Attending: Thoracic Surgery (Cardiothoracic Vascular Surgery)

## 2021-03-21 ENCOUNTER — Inpatient Hospital Stay (HOSPITAL_COMMUNITY): Payer: Medicaid Other

## 2021-03-21 ENCOUNTER — Encounter (HOSPITAL_COMMUNITY): Payer: Self-pay | Admitting: Internal Medicine

## 2021-03-21 DIAGNOSIS — Z9889 Other specified postprocedural states: Secondary | ICD-10-CM

## 2021-03-21 HISTORY — DX: Other specified postprocedural states: Z98.890

## 2021-03-21 HISTORY — PX: TEE WITHOUT CARDIOVERSION: SHX5443

## 2021-03-21 HISTORY — PX: MITRAL VALVE REPAIR: SHX2039

## 2021-03-21 LAB — BASIC METABOLIC PANEL
Anion gap: 8 (ref 5–15)
BUN: 32 mg/dL — ABNORMAL HIGH (ref 6–20)
CO2: 26 mmol/L (ref 22–32)
Calcium: 7.7 mg/dL — ABNORMAL LOW (ref 8.9–10.3)
Chloride: 107 mmol/L (ref 98–111)
Creatinine, Ser: 1.8 mg/dL — ABNORMAL HIGH (ref 0.61–1.24)
GFR, Estimated: 51 mL/min — ABNORMAL LOW (ref >60)
Glucose, Bld: 121 mg/dL — ABNORMAL HIGH (ref 70–99)
Potassium: 4.3 mmol/L (ref 3.5–5.1)
Sodium: 141 mmol/L (ref 135–145)

## 2021-03-21 LAB — POCT I-STAT, CHEM 8
BUN: 37 mg/dL — ABNORMAL HIGH (ref 6–20)
BUN: 35 mg/dL — ABNORMAL HIGH (ref 6–20)
BUN: 34 mg/dL — ABNORMAL HIGH (ref 6–20)
BUN: 34 mg/dL — ABNORMAL HIGH (ref 6–20)
BUN: 34 mg/dL — ABNORMAL HIGH (ref 6–20)
Calcium, Ion: 1.17 mmol/L (ref 1.15–1.40)
Calcium, Ion: 1.13 mmol/L — ABNORMAL LOW (ref 1.15–1.40)
Calcium, Ion: 1.04 mmol/L — ABNORMAL LOW (ref 1.15–1.40)
Calcium, Ion: 1.07 mmol/L — ABNORMAL LOW (ref 1.15–1.40)
Calcium, Ion: 1.09 mmol/L — ABNORMAL LOW (ref 1.15–1.40)
Chloride: 100 mmol/L (ref 98–111)
Chloride: 101 mmol/L (ref 98–111)
Chloride: 101 mmol/L (ref 98–111)
Chloride: 102 mmol/L (ref 98–111)
Chloride: 102 mmol/L (ref 98–111)
Creatinine, Ser: 1.9 mg/dL — ABNORMAL HIGH (ref 0.61–1.24)
Creatinine, Ser: 2 mg/dL — ABNORMAL HIGH (ref 0.61–1.24)
Creatinine, Ser: 1.7 mg/dL — ABNORMAL HIGH (ref 0.61–1.24)
Creatinine, Ser: 1.9 mg/dL — ABNORMAL HIGH (ref 0.61–1.24)
Creatinine, Ser: 1.8 mg/dL — ABNORMAL HIGH (ref 0.61–1.24)
Glucose, Bld: 92 mg/dL (ref 70–99)
Glucose, Bld: 97 mg/dL (ref 70–99)
Glucose, Bld: 141 mg/dL — ABNORMAL HIGH (ref 70–99)
Glucose, Bld: 163 mg/dL — ABNORMAL HIGH (ref 70–99)
Glucose, Bld: 139 mg/dL — ABNORMAL HIGH (ref 70–99)
HCT: 21 % — ABNORMAL LOW (ref 39.0–52.0)
HCT: 19 % — ABNORMAL LOW (ref 39.0–52.0)
HCT: 25 % — ABNORMAL LOW (ref 39.0–52.0)
HCT: 26 % — ABNORMAL LOW (ref 39.0–52.0)
HCT: 23 % — ABNORMAL LOW (ref 39.0–52.0)
Hemoglobin: 7.1 g/dL — ABNORMAL LOW (ref 13.0–17.0)
Hemoglobin: 6.5 g/dL — CL (ref 13.0–17.0)
Hemoglobin: 8.5 g/dL — ABNORMAL LOW (ref 13.0–17.0)
Hemoglobin: 8.8 g/dL — ABNORMAL LOW (ref 13.0–17.0)
Hemoglobin: 7.8 g/dL — ABNORMAL LOW (ref 13.0–17.0)
Potassium: 3.6 mmol/L (ref 3.5–5.1)
Potassium: 3.4 mmol/L — ABNORMAL LOW (ref 3.5–5.1)
Potassium: 3.9 mmol/L (ref 3.5–5.1)
Potassium: 4.2 mmol/L (ref 3.5–5.1)
Potassium: 3.5 mmol/L (ref 3.5–5.1)
Sodium: 141 mmol/L (ref 135–145)
Sodium: 140 mmol/L (ref 135–145)
Sodium: 140 mmol/L (ref 135–145)
Sodium: 141 mmol/L (ref 135–145)
Sodium: 142 mmol/L (ref 135–145)
TCO2: 31 mmol/L (ref 22–32)
TCO2: 29 mmol/L (ref 22–32)
TCO2: 29 mmol/L (ref 22–32)
TCO2: 29 mmol/L (ref 22–32)
TCO2: 27 mmol/L (ref 22–32)

## 2021-03-21 LAB — POCT I-STAT 7, (LYTES, BLD GAS, ICA,H+H)
Acid-Base Excess: 6 mmol/L — ABNORMAL HIGH (ref 0.0–2.0)
Acid-Base Excess: 6 mmol/L — ABNORMAL HIGH (ref 0.0–2.0)
Acid-Base Excess: 5 mmol/L — ABNORMAL HIGH (ref 0.0–2.0)
Acid-Base Excess: 1 mmol/L (ref 0.0–2.0)
Acid-Base Excess: 3 mmol/L — ABNORMAL HIGH (ref 0.0–2.0)
Acid-Base Excess: 4 mmol/L — ABNORMAL HIGH (ref 0.0–2.0)
Acid-Base Excess: 4 mmol/L — ABNORMAL HIGH (ref 0.0–2.0)
Bicarbonate: 31.5 mmol/L — ABNORMAL HIGH (ref 20.0–28.0)
Bicarbonate: 31 mmol/L — ABNORMAL HIGH (ref 20.0–28.0)
Bicarbonate: 31.7 mmol/L — ABNORMAL HIGH (ref 20.0–28.0)
Bicarbonate: 26.5 mmol/L (ref 20.0–28.0)
Bicarbonate: 28.9 mmol/L — ABNORMAL HIGH (ref 20.0–28.0)
Bicarbonate: 29.2 mmol/L — ABNORMAL HIGH (ref 20.0–28.0)
Bicarbonate: 28.9 mmol/L — ABNORMAL HIGH (ref 20.0–28.0)
Calcium, Ion: 1.14 mmol/L — ABNORMAL LOW (ref 1.15–1.40)
Calcium, Ion: 0.9 mmol/L — ABNORMAL LOW (ref 1.15–1.40)
Calcium, Ion: 1 mmol/L — ABNORMAL LOW (ref 1.15–1.40)
Calcium, Ion: 1.06 mmol/L — ABNORMAL LOW (ref 1.15–1.40)
Calcium, Ion: 1.12 mmol/L — ABNORMAL LOW (ref 1.15–1.40)
Calcium, Ion: 1.12 mmol/L — ABNORMAL LOW (ref 1.15–1.40)
Calcium, Ion: 1.11 mmol/L — ABNORMAL LOW (ref 1.15–1.40)
HCT: 24 % — ABNORMAL LOW (ref 39.0–52.0)
HCT: 22 % — ABNORMAL LOW (ref 39.0–52.0)
HCT: 23 % — ABNORMAL LOW (ref 39.0–52.0)
HCT: 29 % — ABNORMAL LOW (ref 39.0–52.0)
HCT: 25 % — ABNORMAL LOW (ref 39.0–52.0)
HCT: 22 % — ABNORMAL LOW (ref 39.0–52.0)
HCT: 23 % — ABNORMAL LOW (ref 39.0–52.0)
Hemoglobin: 8.2 g/dL — ABNORMAL LOW (ref 13.0–17.0)
Hemoglobin: 7.5 g/dL — ABNORMAL LOW (ref 13.0–17.0)
Hemoglobin: 7.8 g/dL — ABNORMAL LOW (ref 13.0–17.0)
Hemoglobin: 9.9 g/dL — ABNORMAL LOW (ref 13.0–17.0)
Hemoglobin: 8.5 g/dL — ABNORMAL LOW (ref 13.0–17.0)
Hemoglobin: 7.5 g/dL — ABNORMAL LOW (ref 13.0–17.0)
Hemoglobin: 7.8 g/dL — ABNORMAL LOW (ref 13.0–17.0)
O2 Saturation: 100 %
O2 Saturation: 100 %
O2 Saturation: 100 %
O2 Saturation: 98 %
O2 Saturation: 99 %
O2 Saturation: 99 %
O2 Saturation: 99 %
Patient temperature: 35.2
Patient temperature: 36.7
Patient temperature: 36.2
Potassium: 3.6 mmol/L (ref 3.5–5.1)
Potassium: 4.1 mmol/L (ref 3.5–5.1)
Potassium: 4.1 mmol/L (ref 3.5–5.1)
Potassium: 3.5 mmol/L (ref 3.5–5.1)
Potassium: 3.8 mmol/L (ref 3.5–5.1)
Potassium: 4.4 mmol/L (ref 3.5–5.1)
Potassium: 4.4 mmol/L (ref 3.5–5.1)
Sodium: 141 mmol/L (ref 135–145)
Sodium: 140 mmol/L (ref 135–145)
Sodium: 142 mmol/L (ref 135–145)
Sodium: 142 mmol/L (ref 135–145)
Sodium: 142 mmol/L (ref 135–145)
Sodium: 140 mmol/L (ref 135–145)
Sodium: 141 mmol/L (ref 135–145)
TCO2: 33 mmol/L — ABNORMAL HIGH (ref 22–32)
TCO2: 32 mmol/L (ref 22–32)
TCO2: 33 mmol/L — ABNORMAL HIGH (ref 22–32)
TCO2: 28 mmol/L (ref 22–32)
TCO2: 30 mmol/L (ref 22–32)
TCO2: 31 mmol/L (ref 22–32)
TCO2: 30 mmol/L (ref 22–32)
pCO2 arterial: 49.6 mmHg — ABNORMAL HIGH (ref 32.0–48.0)
pCO2 arterial: 48.1 mmHg — ABNORMAL HIGH (ref 32.0–48.0)
pCO2 arterial: 58.7 mmHg — ABNORMAL HIGH (ref 32.0–48.0)
pCO2 arterial: 45.4 mmHg (ref 32.0–48.0)
pCO2 arterial: 44.9 mmHg (ref 32.0–48.0)
pCO2 arterial: 46.6 mmHg (ref 32.0–48.0)
pCO2 arterial: 45.6 mmHg (ref 32.0–48.0)
pH, Arterial: 7.411 (ref 7.350–7.450)
pH, Arterial: 7.418 (ref 7.350–7.450)
pH, Arterial: 7.341 — ABNORMAL LOW (ref 7.350–7.450)
pH, Arterial: 7.374 (ref 7.350–7.450)
pH, Arterial: 7.41 (ref 7.350–7.450)
pH, Arterial: 7.403 (ref 7.350–7.450)
pH, Arterial: 7.406 (ref 7.350–7.450)
pO2, Arterial: 503 mmHg — ABNORMAL HIGH (ref 83.0–108.0)
pO2, Arterial: 475 mmHg — ABNORMAL HIGH (ref 83.0–108.0)
pO2, Arterial: 421 mmHg — ABNORMAL HIGH (ref 83.0–108.0)
pO2, Arterial: 117 mmHg — ABNORMAL HIGH (ref 83.0–108.0)
pO2, Arterial: 129 mmHg — ABNORMAL HIGH (ref 83.0–108.0)
pO2, Arterial: 148 mmHg — ABNORMAL HIGH (ref 83.0–108.0)
pO2, Arterial: 113 mmHg — ABNORMAL HIGH (ref 83.0–108.0)

## 2021-03-21 LAB — POCT I-STAT EG7
Acid-Base Excess: 4 mmol/L — ABNORMAL HIGH (ref 0.0–2.0)
Bicarbonate: 30.6 mmol/L — ABNORMAL HIGH (ref 20.0–28.0)
Calcium, Ion: 1.02 mmol/L — ABNORMAL LOW (ref 1.15–1.40)
HCT: 23 % — ABNORMAL LOW (ref 39.0–52.0)
Hemoglobin: 7.8 g/dL — ABNORMAL LOW (ref 13.0–17.0)
O2 Saturation: 78 %
Potassium: 4.3 mmol/L (ref 3.5–5.1)
Sodium: 140 mmol/L (ref 135–145)
TCO2: 32 mmol/L (ref 22–32)
pCO2, Ven: 61.1 mmHg — ABNORMAL HIGH (ref 44.0–60.0)
pH, Ven: 7.307 (ref 7.250–7.430)
pO2, Ven: 48 mmHg — ABNORMAL HIGH (ref 32.0–45.0)

## 2021-03-21 LAB — CBC
HCT: 23 % — ABNORMAL LOW (ref 39.0–52.0)
HCT: 24.6 % — ABNORMAL LOW (ref 39.0–52.0)
HCT: 26.2 % — ABNORMAL LOW (ref 39.0–52.0)
Hemoglobin: 7.4 g/dL — ABNORMAL LOW (ref 13.0–17.0)
Hemoglobin: 7.9 g/dL — ABNORMAL LOW (ref 13.0–17.0)
Hemoglobin: 8.4 g/dL — ABNORMAL LOW (ref 13.0–17.0)
MCH: 28.5 pg (ref 26.0–34.0)
MCH: 28.7 pg (ref 26.0–34.0)
MCH: 28.8 pg (ref 26.0–34.0)
MCHC: 32.2 g/dL (ref 30.0–36.0)
MCHC: 32.1 g/dL (ref 30.0–36.0)
MCHC: 32.1 g/dL (ref 30.0–36.0)
MCV: 88.5 fL (ref 80.0–100.0)
MCV: 89.5 fL (ref 80.0–100.0)
MCV: 89.7 fL (ref 80.0–100.0)
Platelets: 242 10*3/uL (ref 150–400)
Platelets: 176 10*3/uL (ref 150–400)
Platelets: 237 10*3/uL (ref 150–400)
RBC: 2.6 MIL/uL — ABNORMAL LOW (ref 4.22–5.81)
RBC: 2.75 MIL/uL — ABNORMAL LOW (ref 4.22–5.81)
RBC: 2.92 MIL/uL — ABNORMAL LOW (ref 4.22–5.81)
RDW: 15 % (ref 11.5–15.5)
RDW: 14.9 % (ref 11.5–15.5)
RDW: 15.4 % (ref 11.5–15.5)
WBC: 6 10*3/uL (ref 4.0–10.5)
WBC: 10.7 10*3/uL — ABNORMAL HIGH (ref 4.0–10.5)
WBC: 8.4 10*3/uL (ref 4.0–10.5)
nRBC: 0 % (ref 0.0–0.2)
nRBC: 0 % (ref 0.0–0.2)
nRBC: 0 % (ref 0.0–0.2)

## 2021-03-21 LAB — PROTIME-INR
INR: 1.5 — ABNORMAL HIGH (ref 0.8–1.2)
Prothrombin Time: 17.7 seconds — ABNORMAL HIGH (ref 11.4–15.2)

## 2021-03-21 LAB — SURGICAL PCR SCREEN
MRSA, PCR: NEGATIVE
Staphylococcus aureus: NEGATIVE

## 2021-03-21 LAB — COMPREHENSIVE METABOLIC PANEL
ALT: 56 U/L — ABNORMAL HIGH (ref 0–44)
AST: 62 U/L — ABNORMAL HIGH (ref 15–41)
Albumin: 2.6 g/dL — ABNORMAL LOW (ref 3.5–5.0)
Alkaline Phosphatase: 40 U/L (ref 38–126)
Anion gap: 8 (ref 5–15)
BUN: 39 mg/dL — ABNORMAL HIGH (ref 6–20)
CO2: 29 mmol/L (ref 22–32)
Calcium: 8.1 mg/dL — ABNORMAL LOW (ref 8.9–10.3)
Chloride: 103 mmol/L (ref 98–111)
Creatinine, Ser: 2.21 mg/dL — ABNORMAL HIGH (ref 0.61–1.24)
GFR, Estimated: 40 mL/min — ABNORMAL LOW (ref >60)
Glucose, Bld: 90 mg/dL (ref 70–99)
Potassium: 3.9 mmol/L (ref 3.5–5.1)
Sodium: 140 mmol/L (ref 135–145)
Total Bilirubin: 0.4 mg/dL (ref 0.3–1.2)
Total Protein: 5.4 g/dL — ABNORMAL LOW (ref 6.5–8.1)

## 2021-03-21 LAB — CULTURE, BLOOD (ROUTINE X 2)
Culture: NO GROWTH
Culture: NO GROWTH
Special Requests: ADEQUATE
Special Requests: ADEQUATE

## 2021-03-21 LAB — ECHO INTRAOPERATIVE TEE
Height: 67 in
S' Lateral: 3.1 cm
Weight: 2752 oz

## 2021-03-21 LAB — GLUCOSE, CAPILLARY
Glucose-Capillary: 108 mg/dL — ABNORMAL HIGH (ref 70–99)
Glucose-Capillary: 113 mg/dL — ABNORMAL HIGH (ref 70–99)
Glucose-Capillary: 108 mg/dL — ABNORMAL HIGH (ref 70–99)
Glucose-Capillary: 115 mg/dL — ABNORMAL HIGH (ref 70–99)
Glucose-Capillary: 100 mg/dL — ABNORMAL HIGH (ref 70–99)
Glucose-Capillary: 115 mg/dL — ABNORMAL HIGH (ref 70–99)
Glucose-Capillary: 124 mg/dL — ABNORMAL HIGH (ref 70–99)

## 2021-03-21 LAB — PREPARE RBC (CROSSMATCH)

## 2021-03-21 LAB — APTT: aPTT: 31 seconds (ref 24–36)

## 2021-03-21 LAB — PLATELET COUNT: Platelets: 237 10*3/uL (ref 150–400)

## 2021-03-21 LAB — HEMOGLOBIN A1C
Hgb A1c MFr Bld: 4.9 % (ref 4.8–5.6)
Mean Plasma Glucose: 93.93 mg/dL

## 2021-03-21 LAB — HEMOGLOBIN AND HEMATOCRIT, BLOOD
HCT: 24 % — ABNORMAL LOW (ref 39.0–52.0)
Hemoglobin: 7.9 g/dL — ABNORMAL LOW (ref 13.0–17.0)

## 2021-03-21 LAB — MAGNESIUM: Magnesium: 2.8 mg/dL — ABNORMAL HIGH (ref 1.7–2.4)

## 2021-03-21 SURGERY — REPAIR, MITRAL VALVE
Anesthesia: General | Site: Chest

## 2021-03-21 MED ORDER — SODIUM CHLORIDE 0.45 % IV SOLN: INTRAVENOUS | Status: DC | PRN

## 2021-03-21 MED ORDER — OXYCODONE HCL 5 MG PO TABS
5.0000 mg | ORAL_TABLET | ORAL | Status: DC | PRN
  Administered 2021-03-21 – 2021-03-23 (×9): 10 mg via ORAL
  Administered 2021-03-23: 5 mg via ORAL
  Administered 2021-03-24 – 2021-03-26 (×13): 10 mg via ORAL
  Filled 2021-03-21 (×2): qty 2
  Filled 2021-03-21: qty 1
  Filled 2021-03-21 (×20): qty 2

## 2021-03-21 MED ORDER — ASPIRIN EC 325 MG PO TBEC
325.0000 mg | DELAYED_RELEASE_TABLET | Freq: Every day | ORAL | Status: DC
  Administered 2021-03-22 – 2021-03-24 (×3): 325 mg via ORAL
  Filled 2021-03-21 (×3): qty 1

## 2021-03-21 MED ORDER — PROPOFOL 10 MG/ML IV BOLUS
INTRAVENOUS | Status: AC
  Filled 2021-03-21: qty 20

## 2021-03-21 MED ORDER — ACETAMINOPHEN 500 MG PO TABS
1000.0000 mg | ORAL_TABLET | Freq: Four times a day (QID) | ORAL | Status: DC
  Administered 2021-03-21 – 2021-03-24 (×10): 1000 mg via ORAL
  Filled 2021-03-21 (×10): qty 2

## 2021-03-21 MED ORDER — SODIUM CHLORIDE 0.9 % IV SOLN: INTRAVENOUS | Status: DC | PRN

## 2021-03-21 MED ORDER — SODIUM CHLORIDE 0.9 % IV SOLN: INTRAVENOUS | Status: AC

## 2021-03-21 MED ORDER — CHLORHEXIDINE GLUCONATE 0.12 % MT SOLN
15.0000 mL | Freq: Two times a day (BID) | OROMUCOSAL | Status: DC
  Administered 2021-03-21 – 2021-03-25 (×7): 15 mL via OROMUCOSAL
  Filled 2021-03-21 (×6): qty 15

## 2021-03-21 MED ORDER — PANTOPRAZOLE SODIUM 40 MG PO TBEC
40.0000 mg | DELAYED_RELEASE_TABLET | Freq: Every day | ORAL | Status: DC
  Administered 2021-03-23 – 2021-03-24 (×2): 40 mg via ORAL
  Filled 2021-03-21 (×2): qty 1

## 2021-03-21 MED ORDER — SODIUM CHLORIDE 0.9 % IV SOLN: INTRAVENOUS | Status: DC

## 2021-03-21 MED ORDER — CHLORHEXIDINE GLUCONATE CLOTH 2 % EX PADS
6.0000 | MEDICATED_PAD | Freq: Every day | CUTANEOUS | Status: DC
  Administered 2021-03-22 – 2021-03-23 (×2): 6 via TOPICAL

## 2021-03-21 MED ORDER — ORAL CARE MOUTH RINSE
15.0000 mL | Freq: Two times a day (BID) | OROMUCOSAL | Status: DC
  Administered 2021-03-21 – 2021-03-26 (×8): 15 mL via OROMUCOSAL

## 2021-03-21 MED ORDER — ARTIFICIAL TEARS OPHTHALMIC OINT
TOPICAL_OINTMENT | OPHTHALMIC | Status: AC
  Filled 2021-03-21: qty 3.5

## 2021-03-21 MED ORDER — PROPOFOL 10 MG/ML IV BOLUS
INTRAVENOUS | Status: DC | PRN
  Administered 2021-03-21 (×3): 50 mg via INTRAVENOUS

## 2021-03-21 MED ORDER — FENTANYL CITRATE (PF) 250 MCG/5ML IJ SOLN
INTRAMUSCULAR | Status: DC | PRN
  Administered 2021-03-21: 100 ug via INTRAVENOUS
  Administered 2021-03-21: 450 ug via INTRAVENOUS
  Administered 2021-03-21: 50 ug via INTRAVENOUS
  Administered 2021-03-21: 150 ug via INTRAVENOUS
  Administered 2021-03-21: 400 ug via INTRAVENOUS
  Administered 2021-03-21: 50 ug via INTRAVENOUS
  Administered 2021-03-21: 150 ug via INTRAVENOUS

## 2021-03-21 MED ORDER — NON FORMULARY
Status: DC | PRN
  Administered 2021-03-21: 50 mL

## 2021-03-21 MED ORDER — ROCURONIUM BROMIDE 10 MG/ML (PF) SYRINGE
PREFILLED_SYRINGE | INTRAVENOUS | Status: AC
  Filled 2021-03-21: qty 10

## 2021-03-21 MED ORDER — METOPROLOL TARTRATE 25 MG/10 ML ORAL SUSPENSION: 12.5000 mg | Freq: Two times a day (BID) | ORAL | Status: DC

## 2021-03-21 MED ORDER — SODIUM CHLORIDE 0.9% IV SOLUTION: Freq: Once | INTRAVENOUS | Status: DC

## 2021-03-21 MED ORDER — PLASMA-LYTE 148 IV SOLN
INTRAVENOUS | Status: DC | PRN
  Administered 2021-03-21: 500 mL via INTRAVASCULAR

## 2021-03-21 MED ORDER — ARTIFICIAL TEARS OPHTHALMIC OINT
TOPICAL_OINTMENT | OPHTHALMIC | Status: DC | PRN
  Administered 2021-03-21: 1 via OPHTHALMIC

## 2021-03-21 MED ORDER — LACTATED RINGERS IV SOLN: INTRAVENOUS | Status: DC

## 2021-03-21 MED ORDER — FAMOTIDINE IN NACL 20-0.9 MG/50ML-% IV SOLN
20.0000 mg | Freq: Two times a day (BID) | INTRAVENOUS | Status: DC
  Administered 2021-03-21: 20 mg via INTRAVENOUS
  Filled 2021-03-21: qty 50

## 2021-03-21 MED ORDER — 0.9 % SODIUM CHLORIDE (POUR BTL) OPTIME
TOPICAL | Status: DC | PRN
  Administered 2021-03-21: 4000 mL

## 2021-03-21 MED ORDER — MORPHINE SULFATE (PF) 2 MG/ML IV SOLN
1.0000 mg | INTRAVENOUS | Status: DC | PRN
  Administered 2021-03-21: 4 mg via INTRAVENOUS
  Administered 2021-03-21 (×2): 2 mg via INTRAVENOUS
  Administered 2021-03-21: 4 mg via INTRAVENOUS
  Administered 2021-03-21 (×3): 2 mg via INTRAVENOUS
  Administered 2021-03-22 (×2): 4 mg via INTRAVENOUS
  Administered 2021-03-22: 2 mg via INTRAVENOUS
  Administered 2021-03-22 – 2021-03-24 (×9): 4 mg via INTRAVENOUS
  Filled 2021-03-21: qty 1
  Filled 2021-03-21 (×4): qty 2
  Filled 2021-03-21 (×2): qty 1
  Filled 2021-03-21: qty 2
  Filled 2021-03-21: qty 1
  Filled 2021-03-21 (×5): qty 2
  Filled 2021-03-21: qty 1
  Filled 2021-03-21: qty 2
  Filled 2021-03-21: qty 1
  Filled 2021-03-21 (×2): qty 2

## 2021-03-21 MED ORDER — MAGNESIUM SULFATE 4 GM/100ML IV SOLN
4.0000 g | Freq: Once | INTRAVENOUS | Status: AC
  Administered 2021-03-21: 4 g via INTRAVENOUS
  Filled 2021-03-21: qty 100

## 2021-03-21 MED ORDER — LACTATED RINGERS IV SOLN: INTRAVENOUS | Status: DC | PRN

## 2021-03-21 MED ORDER — ACETAMINOPHEN 650 MG RE SUPP
650.0000 mg | Freq: Once | RECTAL | Status: AC
  Administered 2021-03-21: 650 mg via RECTAL

## 2021-03-21 MED ORDER — BISACODYL 10 MG RE SUPP: 10.0000 mg | Freq: Every day | RECTAL | Status: DC

## 2021-03-21 MED ORDER — HEPARIN SODIUM (PORCINE) 1000 UNIT/ML IJ SOLN
INTRAMUSCULAR | Status: AC
  Filled 2021-03-21: qty 1

## 2021-03-21 MED ORDER — DEXMEDETOMIDINE HCL IN NACL 400 MCG/100ML IV SOLN
0.0000 ug/kg/h | INTRAVENOUS | Status: DC
  Administered 2021-03-21: 0.7 ug/kg/h via INTRAVENOUS
  Filled 2021-03-21 (×2): qty 100

## 2021-03-21 MED ORDER — BISACODYL 5 MG PO TBEC
10.0000 mg | DELAYED_RELEASE_TABLET | Freq: Every day | ORAL | Status: DC
  Administered 2021-03-22 – 2021-03-24 (×3): 10 mg via ORAL
  Filled 2021-03-21 (×3): qty 2

## 2021-03-21 MED ORDER — SODIUM CHLORIDE 0.9% FLUSH: 3.0000 mL | INTRAVENOUS | Status: DC | PRN

## 2021-03-21 MED ORDER — METOPROLOL TARTRATE 12.5 MG HALF TABLET
12.5000 mg | ORAL_TABLET | Freq: Two times a day (BID) | ORAL | Status: DC
  Administered 2021-03-21 – 2021-03-22 (×2): 12.5 mg via ORAL
  Filled 2021-03-21 (×2): qty 1

## 2021-03-21 MED ORDER — PROTAMINE SULFATE 10 MG/ML IV SOLN
INTRAVENOUS | Status: AC
  Filled 2021-03-21: qty 25

## 2021-03-21 MED ORDER — ONDANSETRON HCL 4 MG/2ML IJ SOLN
4.0000 mg | Freq: Four times a day (QID) | INTRAMUSCULAR | Status: DC | PRN
  Administered 2021-03-21 – 2021-03-24 (×4): 4 mg via INTRAVENOUS
  Filled 2021-03-21 (×4): qty 2

## 2021-03-21 MED ORDER — PROTAMINE SULFATE 10 MG/ML IV SOLN
INTRAVENOUS | Status: AC
  Filled 2021-03-21: qty 5

## 2021-03-21 MED ORDER — ROCURONIUM BROMIDE 10 MG/ML (PF) SYRINGE
PREFILLED_SYRINGE | INTRAVENOUS | Status: DC | PRN
  Administered 2021-03-21: 100 mg via INTRAVENOUS

## 2021-03-21 MED ORDER — THROMBIN 20000 UNITS EX SOLR
OROMUCOSAL | Status: DC | PRN
  Administered 2021-03-21 (×2): 4 mL via TOPICAL

## 2021-03-21 MED ORDER — SODIUM CHLORIDE 0.9% FLUSH
3.0000 mL | Freq: Two times a day (BID) | INTRAVENOUS | Status: DC
  Administered 2021-03-22 – 2021-03-23 (×4): 3 mL via INTRAVENOUS

## 2021-03-21 MED ORDER — VANCOMYCIN HCL 1000 MG IV SOLR
INTRAVENOUS | Status: DC | PRN
  Administered 2021-03-21: 1000 mL

## 2021-03-21 MED ORDER — DEXTROSE 50 % IV SOLN: 0.0000 mL | INTRAVENOUS | Status: DC | PRN

## 2021-03-21 MED ORDER — VECURONIUM BROMIDE 10 MG IV SOLR
INTRAVENOUS | Status: AC
  Filled 2021-03-21: qty 10

## 2021-03-21 MED ORDER — THROMBIN 20000 UNITS EX KIT
PACK | CUTANEOUS | Status: DC | PRN
  Administered 2021-03-21: 20000 [IU] via TOPICAL

## 2021-03-21 MED ORDER — LACTATED RINGERS IV SOLN: 500.0000 mL | Freq: Once | INTRAVENOUS | Status: DC | PRN

## 2021-03-21 MED ORDER — TRAMADOL HCL 50 MG PO TABS
50.0000 mg | ORAL_TABLET | ORAL | Status: DC | PRN
  Administered 2021-03-21: 50 mg via ORAL
  Administered 2021-03-22 (×3): 100 mg via ORAL
  Administered 2021-03-23: 50 mg via ORAL
  Administered 2021-03-23 – 2021-03-25 (×5): 100 mg via ORAL
  Filled 2021-03-21 (×8): qty 2
  Filled 2021-03-21: qty 1
  Filled 2021-03-21: qty 2

## 2021-03-21 MED ORDER — MIDAZOLAM HCL 2 MG/2ML IJ SOLN
2.0000 mg | INTRAMUSCULAR | Status: DC | PRN
  Administered 2021-03-21: 2 mg via INTRAVENOUS
  Filled 2021-03-21: qty 2

## 2021-03-21 MED ORDER — SODIUM CHLORIDE (PF) 0.9 % IJ SOLN
INTRAMUSCULAR | Status: AC
  Filled 2021-03-21: qty 10

## 2021-03-21 MED ORDER — ACETAMINOPHEN 160 MG/5ML PO SOLN: 1000.0000 mg | Freq: Four times a day (QID) | ORAL | Status: DC

## 2021-03-21 MED ORDER — ALBUMIN HUMAN 5 % IV SOLN: 250.0000 mL | INTRAVENOUS | Status: DC | PRN

## 2021-03-21 MED ORDER — SODIUM CHLORIDE 0.9 % IV SOLN: 250.0000 mL | INTRAVENOUS | Status: DC

## 2021-03-21 MED ORDER — INSULIN ASPART 100 UNIT/ML ~~LOC~~ SOLN
0.0000 [IU] | SUBCUTANEOUS | Status: DC
  Administered 2021-03-22: 2 [IU] via SUBCUTANEOUS

## 2021-03-21 MED ORDER — SODIUM CHLORIDE (PF) 0.9 % IJ SOLN
OROMUCOSAL | Status: DC | PRN
  Administered 2021-03-21 (×5): 4 mL via TOPICAL

## 2021-03-21 MED ORDER — CHLORHEXIDINE GLUCONATE 0.12 % MT SOLN
15.0000 mL | OROMUCOSAL | Status: AC
  Administered 2021-03-21: 15 mL via OROMUCOSAL

## 2021-03-21 MED ORDER — PHENYLEPHRINE HCL-NACL 20-0.9 MG/250ML-% IV SOLN: 0.0000 ug/min | INTRAVENOUS | Status: DC

## 2021-03-21 MED ORDER — VECURONIUM BROMIDE 10 MG IV SOLR
INTRAVENOUS | Status: DC | PRN
  Administered 2021-03-21 (×2): 5 mg via INTRAVENOUS

## 2021-03-21 MED ORDER — METOPROLOL TARTRATE 5 MG/5ML IV SOLN: 2.5000 mg | INTRAVENOUS | Status: DC | PRN

## 2021-03-21 MED ORDER — MIDAZOLAM HCL 5 MG/5ML IJ SOLN
INTRAMUSCULAR | Status: DC | PRN
  Administered 2021-03-21 (×2): 1 mg via INTRAVENOUS
  Administered 2021-03-21: 2 mg via INTRAVENOUS
  Administered 2021-03-21: 1 mg via INTRAVENOUS
  Administered 2021-03-21: 2 mg via INTRAVENOUS
  Administered 2021-03-21 (×3): 1 mg via INTRAVENOUS

## 2021-03-21 MED ORDER — FENTANYL CITRATE (PF) 250 MCG/5ML IJ SOLN
INTRAMUSCULAR | Status: AC
  Filled 2021-03-21: qty 25

## 2021-03-21 MED ORDER — ACETAMINOPHEN 160 MG/5ML PO SOLN: 650.0000 mg | Freq: Once | ORAL | Status: AC

## 2021-03-21 MED ORDER — THROMBIN (RECOMBINANT) 20000 UNITS EX SOLR
CUTANEOUS | Status: AC
  Filled 2021-03-21: qty 20000

## 2021-03-21 MED ORDER — FENTANYL CITRATE (PF) 250 MCG/5ML IJ SOLN
INTRAMUSCULAR | Status: AC
  Filled 2021-03-21: qty 5

## 2021-03-21 MED ORDER — ASPIRIN 81 MG PO CHEW: 324.0000 mg | CHEWABLE_TABLET | Freq: Every day | ORAL | Status: DC

## 2021-03-21 MED ORDER — POTASSIUM CHLORIDE 10 MEQ/50ML IV SOLN
10.0000 meq | INTRAVENOUS | Status: AC
  Administered 2021-03-21 (×3): 10 meq via INTRAVENOUS

## 2021-03-21 MED ORDER — PROTAMINE SULFATE 10 MG/ML IV SOLN
INTRAVENOUS | Status: DC | PRN
  Administered 2021-03-21: 50 mg via INTRAVENOUS
  Administered 2021-03-21: 270 mg via INTRAVENOUS

## 2021-03-21 MED ORDER — INSULIN REGULAR(HUMAN) IN NACL 100-0.9 UT/100ML-% IV SOLN: INTRAVENOUS | Status: DC

## 2021-03-21 MED ORDER — DOCUSATE SODIUM 100 MG PO CAPS
200.0000 mg | ORAL_CAPSULE | Freq: Every day | ORAL | Status: DC
  Administered 2021-03-22 – 2021-03-24 (×3): 200 mg via ORAL
  Filled 2021-03-21 (×3): qty 2

## 2021-03-21 MED ORDER — MIDAZOLAM HCL (PF) 10 MG/2ML IJ SOLN
INTRAMUSCULAR | Status: AC
  Filled 2021-03-21: qty 2

## 2021-03-21 MED ORDER — NITROGLYCERIN IN D5W 200-5 MCG/ML-% IV SOLN: 0.0000 ug/min | INTRAVENOUS | Status: DC

## 2021-03-21 MED ORDER — HEPARIN SODIUM (PORCINE) 1000 UNIT/ML IJ SOLN
INTRAMUSCULAR | Status: DC | PRN
  Administered 2021-03-21: 27000 [IU] via INTRAVENOUS

## 2021-03-21 SURGICAL SUPPLY — 99 items
ADAPTER CARDIO PERF ANTE/RETRO (ADAPTER) ×6 IMPLANT
ADH SKN CLS APL DERMABOND .7 (GAUZE/BANDAGES/DRESSINGS) ×2
ADPR PRFSN 84XANTGRD RTRGD (ADAPTER) ×4
BAG DECANTER FOR FLEXI CONT (MISCELLANEOUS) ×3 IMPLANT
BLADE CLIPPER SURG (BLADE) ×3 IMPLANT
BLADE STERNUM SYSTEM 6 (BLADE) ×3 IMPLANT
BLADE SURG 11 STRL SS (BLADE) ×6 IMPLANT
BLOOD HAEMOCONCENTR 700 MIDI (MISCELLANEOUS) ×3 IMPLANT
CANISTER SUCT 3000ML PPV (MISCELLANEOUS) ×3 IMPLANT
CANNULA ADULT BIO-MEDICUS 15FR (CANNULA) ×3 IMPLANT
CANNULA EZ GLIDE AORTIC 21FR (CANNULA) ×3 IMPLANT
CANNULA FEM VENOUS REMOTE 22FR (CANNULA) ×3 IMPLANT
CANNULA GUNDRY RCSP 15FR (MISCELLANEOUS) ×3 IMPLANT
CANNULA SUMP PERICARDIAL (CANNULA) ×6 IMPLANT
CLOSURE PERCLOSE PROSTYLE (VASCULAR PRODUCTS) ×6 IMPLANT
CNTNR URN SCR LID CUP LEK RST (MISCELLANEOUS) ×4 IMPLANT
CONN 1/2X1/2X1/2  BEN (MISCELLANEOUS) ×1
CONN 1/2X1/2X1/2 BEN (MISCELLANEOUS) ×2 IMPLANT
CONN 3/8X1/2 ST GISH (MISCELLANEOUS) ×6 IMPLANT
CONN ST 1/4X3/8  BEN (MISCELLANEOUS) ×1
CONN ST 1/4X3/8 BEN (MISCELLANEOUS) ×2 IMPLANT
CONNECTOR 1/2X3/8X1/2 3 WAY (MISCELLANEOUS) ×1
CONNECTOR 1/2X3/8X1/2 3WAY (MISCELLANEOUS) ×2 IMPLANT
CONT SPEC 4OZ STRL OR WHT (MISCELLANEOUS) ×6
CONTAINER PROTECT SURGISLUSH (MISCELLANEOUS) ×3 IMPLANT
COVER PROBE W GEL 5X96 (DRAPES) ×3 IMPLANT
DEFOGGER ANTIFOG KIT (MISCELLANEOUS) ×3 IMPLANT
DERMABOND ADVANCED (GAUZE/BANDAGES/DRESSINGS) ×1
DERMABOND ADVANCED .7 DNX12 (GAUZE/BANDAGES/DRESSINGS) ×2 IMPLANT
DEVICE CLOSURE PERCLS PRGLD 6F (VASCULAR PRODUCTS) IMPLANT
DEVICE SUT CK QUICK LOAD INDV (Prosthesis & Implant Heart) ×3 IMPLANT
DEVICE SUT CK QUICK LOAD MINI (Prosthesis & Implant Heart) ×3 IMPLANT
DRAIN CHANNEL 32F RND 10.7 FF (WOUND CARE) IMPLANT
DRAPE CV SPLIT W-CLR ANES SCRN (DRAPES) ×3 IMPLANT
DRAPE INCISE IOBAN 66X45 STRL (DRAPES) ×9 IMPLANT
DRAPE PERI GROIN 82X75IN TIB (DRAPES) ×3 IMPLANT
DRAPE SLUSH/WARMER DISC (DRAPES) IMPLANT
DRSG AQUACEL AG ADV 3.5X14 (GAUZE/BANDAGES/DRESSINGS) ×3 IMPLANT
ELECT REM PT RETURN 9FT ADLT (ELECTROSURGICAL) ×6
ELECTRODE REM PT RTRN 9FT ADLT (ELECTROSURGICAL) ×4 IMPLANT
FELT TEFLON 1X6 (MISCELLANEOUS) ×3 IMPLANT
FIBERTAPE STERNAL CLSR 2 36IN (SUTURE) ×12 IMPLANT
FIBERTAPE STERNAL CLSR 2X36 (SUTURE) ×9 IMPLANT
GAUZE SPONGE 4X4 12PLY STRL (GAUZE/BANDAGES/DRESSINGS) ×6 IMPLANT
GAUZE SPONGE 4X4 12PLY STRL LF (GAUZE/BANDAGES/DRESSINGS) ×3 IMPLANT
GOWN STRL REUS W/ TWL LRG LVL3 (GOWN DISPOSABLE) ×8 IMPLANT
GOWN STRL REUS W/TWL LRG LVL3 (GOWN DISPOSABLE) ×12
HEMOSTAT POWDER SURGIFOAM 1G (HEMOSTASIS) ×6 IMPLANT
INSERT FOGARTY XLG (MISCELLANEOUS) ×3 IMPLANT
KIT BASIN OR (CUSTOM PROCEDURE TRAY) ×3 IMPLANT
KIT DILATOR VASC 18G NDL (KITS) ×3 IMPLANT
KIT DRAINAGE VACCUM ASSIST (KITS) ×3 IMPLANT
KIT SUCTION CATH 14FR (SUCTIONS) ×9 IMPLANT
KIT SUT CK MINI COMBO 4X17 (Prosthesis & Implant Heart) ×3 IMPLANT
KIT TURNOVER KIT B (KITS) ×3 IMPLANT
LINE VENT (MISCELLANEOUS) ×3 IMPLANT
NDL SUT PASSING CERCLAGE MED (SUTURE) ×6
NEEDLE SUT PASSING CERCLAG MED (SUTURE) ×4 IMPLANT
NS IRRIG 1000ML POUR BTL (IV SOLUTION) ×15 IMPLANT
PACK E OPEN HEART (SUTURE) ×3 IMPLANT
PACK OPEN HEART (CUSTOM PROCEDURE TRAY) ×3 IMPLANT
PAD ARMBOARD 7.5X6 YLW CONV (MISCELLANEOUS) ×6 IMPLANT
PERCLOSE PROGLIDE 6F (VASCULAR PRODUCTS)
POSITIONER HEAD DONUT 9IN (MISCELLANEOUS) ×3 IMPLANT
RING ANLPLS SIMUFORM 28 (Prosthesis & Implant Heart) ×2 IMPLANT
RING ANNULOPLASTY SIMUFORM 28 (Prosthesis & Implant Heart) ×3 IMPLANT
SET CARDIOPLEGIA MPS 5001102 (MISCELLANEOUS) ×3 IMPLANT
SET IRRIG TUBING LAPAROSCOPIC (IRRIGATION / IRRIGATOR) ×3 IMPLANT
SHEATH PINNACLE 8F 10CM (SHEATH) ×6 IMPLANT
SPONGE T-LAP 4X18 ~~LOC~~+RFID (SPONGE) ×3 IMPLANT
SUT BONE WAX W31G (SUTURE) IMPLANT
SUT ETHIBOND (SUTURE) ×6 IMPLANT
SUT ETHIBOND 2 0 SH (SUTURE) ×3 IMPLANT
SUT ETHIBOND 2-0 RB-1 WHT (SUTURE) ×6 IMPLANT
SUT ETHIBOND X763 2 0 SH 1 (SUTURE) ×9 IMPLANT
SUT GORETEX 5 0 TT13 24 (SUTURE) ×21 IMPLANT
SUT MNCRL AB 3-0 PS2 18 (SUTURE) IMPLANT
SUT PDS AB 1 CTX 36 (SUTURE) IMPLANT
SUT PROLENE 3 0 SH 1 (SUTURE) ×3 IMPLANT
SUT PROLENE 3 0 SH DA (SUTURE) ×9 IMPLANT
SUT PROLENE 3 0 SH1 36 (SUTURE) ×3 IMPLANT
SUT PROLENE 4 0 RB 1 (SUTURE) ×9
SUT PROLENE 4 0 SH DA (SUTURE) ×9 IMPLANT
SUT PROLENE 4-0 RB1 .5 CRCL 36 (SUTURE) ×6 IMPLANT
SUT PROLENE 6 0 C 1 30 (SUTURE) ×3 IMPLANT
SUT SILK  1 MH (SUTURE) ×2
SUT SILK 1 MH (SUTURE) ×4 IMPLANT
SUT STEEL STERNAL CCS#1 18IN (SUTURE) IMPLANT
SUT STEEL SZ 6 DBL 3X14 BALL (SUTURE) IMPLANT
SYSTEM SAHARA CHEST DRAIN ATS (WOUND CARE) ×3 IMPLANT
TAPE PAPER 3X10 WHT MICROPORE (GAUZE/BANDAGES/DRESSINGS) ×3 IMPLANT
TOWEL GREEN STERILE (TOWEL DISPOSABLE) ×3 IMPLANT
TOWEL GREEN STERILE FF (TOWEL DISPOSABLE) ×3 IMPLANT
TRAY FOLEY SLVR 16FR TEMP STAT (SET/KITS/TRAYS/PACK) ×3 IMPLANT
TUBE SUCT INTRACARD DLP 20F (MISCELLANEOUS) ×3 IMPLANT
UNDERPAD 30X36 HEAVY ABSORB (UNDERPADS AND DIAPERS) ×3 IMPLANT
WATER STERILE IRR 1000ML POUR (IV SOLUTION) ×6 IMPLANT
WIRE EMERALD 3MM-J .035X150CM (WIRE) IMPLANT
WIRE HI TORQ VERSACORE-J 145CM (WIRE) ×3 IMPLANT

## 2021-03-21 NOTE — Progress Notes (Signed)
TCTS Evening Rounds  DOS s/p MVr Extubated; reports thirst PE: BP 114/84   Pulse 85   Temp 97.7 F (36.5 C)   Resp 16   Ht 5\' 7"  (1.702 m)   Wt 78 kg   SpO2 100%   BMI 26.94 kg/m  Alert/oriented CTA RRR Ext: well-perfused   Intake/Output Summary (Last 24 hours) at 03/21/2021 1715 Last data filed at 03/21/2021 1700 Gross per 24 hour  Intake 5270.19 ml  Output 3093 ml  Net 2177.19 ml   A/p: doing well Continue present management Jonus Coble Z. 03/23/2021, MD [phone]

## 2021-03-21 NOTE — Op Note (Signed)
CARDIOTHORACIC SURGERY OPERATIVE NOTE  Date of Procedure:  03/21/2021  Preoperative Diagnosis:   Enterococcus faecalis bacterial endocarditis  Severe Mitral Regurgitation  Postoperative Diagnosis: Same  Procedure:    Mitral Valve Repair  Complex valvuloplasty including quadrangular resection of infected anterior leaflet  Medtronic SimuForm Ring Annuloplasty (size 80mm, model # [id number], serial # [id number])    Surgeon: Salvatore Decent. Cornelius Moras, MD  Assistant: Rowe Clack, PA-C  Anesthesia: Rosezella Florida, MD  Operative Findings:  Active bacterial endocarditis with perforation of anterior leaflet adjacent to anterior commissure  Type I dysfunction with severe mitral regurgitation  Normal left ventricular systolic function  No residual mitral regurgitation after successful valve repair                     BRIEF CLINICAL NOTE AND INDICATIONS FOR SURGERY  Patient is a 30 year old male with no previous cardiac history and specifically no history of heart murmur or diagnosis of mitral valve prolapse who presented earlier this month with febrile illness complicated by acute renal failure and volume overload.  Blood cultures are positive for Enterococcus faecalis and both transthoracic and transesophageal echocardiograms were performed confirming the presence of bacterial endocarditis.  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  The patient was initially discharged with home IV antibiotics but readmitted to the hospital with class IV congestive heart failure and volume overload.  Follow-up transthoracic echocardiogram performed March 18, 2021 revealed severe mitral regurgitation.  The patient was transferred to [facility] for further management.  The patient has been seen in  consultation and counseled at length regarding the indications, risks and potential benefits of surgery.  All questions have been answered, and the patient provides full informed consent for the operation as described.    DETAILS OF THE OPERATIVE PROCEDURE  Preparation:  The patient is brought to the operating room on the above mentioned date and central monitoring was established by the anesthesia team including placement of Swan-Ganz catheter through the left internal jugular vein.  A radial arterial line is placed. The patient is placed in the supine position on the operating table.  Intravenous antibiotics are administered. General endotracheal anesthesia is induced uneventfully. The patient is initially intubated using a dual lumen endotracheal tube.  A Foley catheter is placed.  Baseline transesophageal echocardiogram was performed.  Findings were notable for obvious vegetations that were somewhat mobile and attached to the anterior leaflet near the anterior commissure (A1).  There was an eccentric jet of mitral regurgitation directed around the posterior aspect of the left atrium.  There was severe mitral regurgitation.  There was normal left ventricular function.  There was no sign of annular abscess formation or other abnormalities.  All of the remaining heart valves appeared normal.  A soft roll is placed behind the patient's left scapula and the neck gently extended and turned to the left.   The patient's right neck, chest, abdomen, both groins, and both lower extremities are prepared and draped in a sterile manner. A time out procedure is performed.   Percutaneous Vascular Access:  Percutaneous venous access were obtained on the right side.  Using ultrasound guidance the right common femoral vein was cannulated using the Seldinger technique a pair of Perclose vascular closure devises were placed at opposing 30 degree angles in the femoral vein, after which time an 8 [place]  sheath  inserted.  The right internal jugular vein was cannulated  using ultrasound guidance and an 8 French sheath inserted.     Surgical Approach:  A median sternotomy incision is performed.   A portion of the patient's pericardium is removed and soaked in 0.65% glutaraldehyde solution for 3 minutes followed by consecutive baths of saline to be rinsed per protocol in anticipation of possible need for patch augmentation of the valve.   Extracorporeal Cardiopulmonary Bypass and Myocardial Protection:   The patient was heparinized systemically.  The ascending aorta is cannulated for cardiopulmonary bypass.  The right common femoral vein is cannulated through the venous sheath and a guidewire advanced into the right atrium using TEE guidance.  The femoral vein cannulated using a 22 Fr long femoral venous cannula.  The right internal jugular vein is cannulated through the venous sheath and a guidewire advanced into the right atrium.  The internal jugular vein is cannulated using a 15 [place] pediatric femoral venous cannula.   Adequate heparinization is verified.   The entire pre-bypass portion of the operation was notable for stable hemodynamics.  Cardiopulmonary bypass was begun.  Vacuum assist venous drainage is utilized.  An antegrade cardioplegia cannula is placed in the ascending aorta.    The patient is cooled to 32C systemic temperature.  The aortic cross clamp is applied and cardioplegia is delivered in an antegrade fashion through the aortic root using modified [place] cold blood cardioplegia ([place]estone blood cardioplegia protocol).   The initial cardioplegic arrest is rapid with early diastolic arrest.  Myocardial protection was felt to be excellent.   Mitral Valve Repair:  A left atriotomy incision was performed through the interatrial groove and extended partially across the back wall of the left atrium after opening the oblique sinus inferiorly.  The mitral valve is exposed using a  self-retaining retractor.  The mitral valve was inspected and notable for active bacterial endocarditis with an obvious vegetation adherent to a partially destroyed portion of the free margin of the A1 portion of the anterior leaflet immediately adjacent to the anterior commissure.  Portions of the vegetation and infected leaflet tissue are sent for routine aerobic and anaerobic culture.  All of the infected leaflet tissue was excised sharply with the adjacent subvalvular apparatus.  Relatively small amount of leaflet tissue is excised leaving a relatively small quadrangular defect in the leaflet immediately adjacent to the anterior commissure.  The mitral valve leaflet, the left atrium, and the left ventricle are irrigated with copious saline solution.  Interrupted 2-0 Ethibond horizontal mattress sutures are placed circumferentially around the entire mitral valve annulus. The sutures will ultimately be utilized for ring annuloplasty, and at this juncture there are utilized to suspend the valve symmetrically.  The defect in the anterior leaflet is closed using simple interrupted everting CV 5 Gore-Tex suture.  The valve was tested with saline and appeared competent even without ring annuloplasty complete. The valve was sized to a 28 mm annuloplasty ring, based upon the transverse distance between the left and right commissures and the height of the anterior leaflet, corresponding to a size just slightly larger than the overall surface area of the anterior leaflet.  A Medtronic SimuForm annuloplasty ring (size 9mm, catalog [id number], serial [id number]) was secured in place uneventfully. All ring sutures were secured using a Cor-knot device.    The valve was tested with saline and appeared competent. There is no residual leak. There was a broad, symmetrical line of coaptation of the anterior and  posterior leaflet which was confirmed using the blue ink test.  Rewarming is begun.   Procedure Completion:  The  atriotomy was closed using a 2-layer closure of running 3-0 Prolene suture after placing a sump drain across the mitral valve to serve as a left ventricular vent.  One final dose of warm "reanimation dose" cardioplegia was administered through the aortic root.  The aortic cross clamp was removed after a total cross clamp time of 77 minutes.  Epicardial pacing wires are fixed to the inferior wall of the right ventricule and to the right atrial appendage. The patient is rewarmed to 37C temperature. The left ventricular vent and antegrade cardioplegia cannula are removed.  The patient is weaned and disconnected from cardiopulmonary bypass.  The patient's rhythm at separation from bypass was sinus.  The patient was weaned from bypass without any inotropic support. Total cardiopulmonary bypass time for the operation was 91 minutes.  Followup transesophageal echocardiogram performed after separation from bypass revealed a well-seated annuloplasty ring in the mitral position with a normal functioning mitral valve. There was no residual leak.  Left ventricular function was unchanged from preoperatively.    The aortic and femoral venous cannula were removed and all Perclose sutures secured.  Manual pressure was maintained while Protamine was administered.  The right internal jugular cannula was removed and manual pressure held on the neck and groin for 15 minutes.    Mediastinum is inspected for hemostasis.  There is moderate coagulopathy verified using thromboelastogram.  The patient is transfused 2 units fresh frozen plasma.  Coagulopathy improves.  The post-bypass portion of the operation was notable for stable rhythm and hemodynamics.  The patient received 2 units packed red blood cells during the procedure due to anemia which was present preoperatively and exacerbated by acute blood loss and hemodilution during cardiopulmonary bypass.  The sternum was closed using fiber tape cerclage.  Soft tissues  anterior to the sternum are closed multiple layers.  Skin is closed with subcuticular skin closure.    Disposition:  The patient tolerated the procedure well.  The patient was transported to the surgical intensive care unit in stable condition. There were no intraoperative complications. All sponge instrument and needle counts are verified correct at completion of the operation.     Salvatore Decent. Cornelius Moras MD 03/21/2021 1:08 PM

## 2021-03-21 NOTE — Anesthesia Procedure Notes (Signed)
Central Venous Catheter Insertion Performed by: Heather Roberts, MD, anesthesiologist Start/End4/21/2022 6:58 AM, 03/21/2021 7:06 AM Patient location: Pre-op. Preanesthetic checklist: patient identified, IV checked, site marked, risks and benefits discussed, surgical consent, monitors and equipment checked, pre-op evaluation, timeout performed and anesthesia consent Position: Trendelenburg Lidocaine 1% used for infiltration and patient sedated Hand hygiene performed , maximum sterile barriers used  and Seldinger technique used Catheter size: 8.5 Fr Total catheter length 8. PA cath was placed.Sheath introducer Swan type:thermodilution PA Cath depth:50 Procedure performed using ultrasound guided technique. Ultrasound Notes:anatomy identified, needle tip was noted to be adjacent to the nerve/plexus identified, no ultrasound evidence of intravascular and/or intraneural injection and image(s) printed for medical record Attempts: 1 Following insertion, line sutured and dressing applied. Post procedure assessment: free fluid flow, blood return through all ports and no air  Patient tolerated the procedure well with no immediate complications.

## 2021-03-21 NOTE — Anesthesia Procedure Notes (Signed)
Procedure Name: Intubation Date/Time: 03/21/2021 7:53 AM Performed by: Moshe Salisbury, CRNA Pre-anesthesia Checklist: Patient identified, Emergency Drugs available, Suction available and Patient being monitored Patient Re-evaluated:Patient Re-evaluated prior to induction Oxygen Delivery Method: Circle System Utilized Preoxygenation: Pre-oxygenation with 100% oxygen Induction Type: IV induction Ventilation: Mask ventilation without difficulty Laryngoscope Size: Mac and 4 Grade View: Grade I Tube type: Oral Tube size: 8.0 mm Number of attempts: 1 Airway Equipment and Method: Stylet Placement Confirmation: ETT inserted through vocal cords under direct vision,  positive ETCO2 and breath sounds checked- equal and bilateral Secured at: 23 cm Tube secured with: Tape Dental Injury: Teeth and Oropharynx as per pre-operative assessment

## 2021-03-21 NOTE — Brief Op Note (Signed)
03/19/2021 - 03/21/2021  11:45 AM  PATIENT:  [name]  30 y.o. male  PRE-OPERATIVE DIAGNOSIS:  MR  POST-OPERATIVE DIAGNOSIS:  MR  PROCEDURE:  Procedure(s): MITRAL VALVE REPAIR (MVR) USING MEDTRONIC SIMUFORM SEMI-RIGID ANNULOPLASTY RING (N/A) TRANSESOPHAGEAL ECHOCARDIOGRAM (TEE) (N/A)  SURGEON:  Surgeon(s) and Role:    Purcell Nails, MD - Primary  PHYSICIAN ASSISTANT: WAYNE GOLD PA-C  ASSISTANTS: STAFF   ANESTHESIA:   general  EBL:  434 mL  BLOOD ADMINISTERED:2 units CC PRBC  DRAINS: MEDIASTINAL CHEST TUBES   LOCAL MEDICATIONS USED:  NONE  SPECIMEN:  Source of Specimen:  PORTION OF MITRAL LEAFLET  DISPOSITION OF SPECIMEN:  PATH/MICRO  COUNTS:  YES  TOURNIQUET:  * No tourniquets in log *  DICTATION: .Dragon Dictation  PLAN OF CARE: Admit to inpatient   PATIENT DISPOSITION:  ICU - intubated and hemodynamically stable.   Delay start of Pharmacological VTE agent (>24hrs) due to surgical blood loss or risk of bleeding: yes  COMPLICATIONS: NO KNOWN

## 2021-03-21 NOTE — Progress Notes (Signed)
      [address]       [place] [address]             [phone]     CARDIOTHORACIC SURGERY PROGRESS NOTE  Subjective: Jerome Mccormick has been scheduled for Procedure(s): MITRAL VALVE REPAIR (MVR) Possible Mitral Valve Replacement (N/A) TRANSESOPHAGEAL ECHOCARDIOGRAM (TEE) (N/A) today.   Objective: Vital signs in last 24 hours: Temp:  [97.8 F (36.6 C)-98.3 F (36.8 C)] 97.9 F (36.6 C) (04/21 0318) Pulse Rate:  [66-81] 80 (04/21 0318) Cardiac Rhythm: Normal sinus rhythm (04/20 2024) Resp:  [16-20] 16 (04/21 0318) BP: (122-137)/(92-98) 137/93 (04/21 0318) SpO2:  [97 %-100 %] 98 % (04/21 0318) Weight:  [78 kg] 78 kg (04/21 0318)  Physical Exam: Unchanged from previously   Intake/Output from previous day: 04/20 0701 - 04/21 0700 In: 1280 [P.O.:1080; IV Piggyback:200] Out: 2000 [Urine:2000] Intake/Output this shift: Total I/O In: 680 [P.O.:480; IV Piggyback:200] Out: 1100 [Urine:1100]  Lab Results: Recent Labs    03/20/21 0409 03/21/21 0328  WBC 5.8 6.0  HGB 7.4* 7.4*  HCT 23.3* 23.0*  PLT 253 242   BMET:  Recent Labs    03/20/21 0409 03/21/21 0328  NA 139 140  K 3.8 3.9  CL 102 103  CO2 29 29  GLUCOSE 88 90  BUN 43* 39*  CREATININE 2.68* 2.21*  CALCIUM 7.8* 8.1*    CBG (last 3)  No results for input(s): GLUCAP in the last 72 hours. PT/INR:   Recent Labs    03/20/21 1331  LABPROT 15.0  INR 1.2    Assessment/Plan:   The various methods of treatment have been discussed with the patient. After consideration of the risks, benefits and treatment options the patient has consented to the planned procedure.   The patient has been seen and labs reviewed. There are no changes in the patient's condition to prevent proceeding with the planned procedure today.   Purcell Nails, MD 03/21/2021 6:35 AM

## 2021-03-21 NOTE — Transfer of Care (Signed)
Immediate Anesthesia Transfer of Care Note  Patient: Jerome Mccormick  Procedure(s) Performed: MITRAL VALVE REPAIR (MVR) USING MEDTRONIC SIMUFORM SEMI-RIGID ANNULOPLASTY RING (N/A Chest) TRANSESOPHAGEAL ECHOCARDIOGRAM (TEE) (N/A )  Patient Location: ICU  Anesthesia Type:General  Level of Consciousness: sedated and Patient remains intubated per anesthesia plan  Airway & Oxygen Therapy: Patient remains intubated per anesthesia plan and Patient placed on Ventilator (see vital sign flow sheet for setting)  Post-op Assessment: Report given to RN and Post -op Vital signs reviewed and stable  Post vital signs: Reviewed and stable  Last Vitals:  Vitals Value Taken Time  BP 126/76 03/21/21 1325  Temp    Pulse 82 03/21/21 1337  Resp 14 03/21/21 1337  SpO2 100 % 03/21/21 1337  Vitals shown include unvalidated device data.  Last Pain:  Vitals:   03/21/21 0318  TempSrc: Oral  PainSc:          Complications: No complications documented.

## 2021-03-21 NOTE — Anesthesia Procedure Notes (Signed)
Arterial Line Insertion Start/End4/21/2022 6:55 AM Performed by: Kennyth Arnold, RN  Preanesthetic checklist: patient identified, IV checked, site marked, risks and benefits discussed, surgical consent, monitors and equipment checked, pre-op evaluation, timeout performed and anesthesia consent Lidocaine 1% used for infiltration and patient sedated Left, radial was placed Catheter size: 20 G Hand hygiene performed , maximum sterile barriers used  and Seldinger technique used Allen's test indicative of satisfactory collateral circulation Attempts: 1 Procedure performed without using ultrasound guided technique. Following insertion, dressing applied and Biopatch. Post procedure assessment: normal  Patient tolerated the procedure well with no immediate complications.

## 2021-03-21 NOTE — Progress Notes (Signed)
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 Undergoing MV repair vs replacement by Dr. Ashley Mariner today. Plan to continue on ampicillin plus ceftriaxone x 6 wk using 4/22 as day 1, re-starting antibiotic clock since surgery.   We will see back on 4/22, no change in the antibiotic regimen at this time from ID standpoint. Will folllow up on OR cultures.   Duke Salvia Drue Second MD MPH [facility] [phone]

## 2021-03-21 NOTE — Progress Notes (Signed)
Rt tried to obtain the abg once pt. Unable to tolerate the pain and refused for the RT to try again.

## 2021-03-21 NOTE — Procedures (Signed)
Extubation Procedure Note  Patient Details:   Name: Jerome Mccormick DOB: 1991-04-22 MRN: [id number]   Airway Documentation:    Vent end date: 03/21/21 Vent end time: 1631   Evaluation  O2 sats: stable throughout Complications: No apparent complications Patient did tolerate procedure well. Bilateral Breath Sounds: Clear,Diminished   Yes   Patient extubated per rapid wean protocol. Positive cuff leak. No stridor noted. NIF -24, VC 1100. Vitals are stable on 3L [place]. RN at bedside.  Harmon Dun Perlie Scheuring 03/21/2021, 4:32 PM

## 2021-03-21 NOTE — Anesthesia Postprocedure Evaluation (Signed)
Anesthesia Post Note  Patient: Jerome Mccormick  Procedure(s) Performed: MITRAL VALVE REPAIR (MVR) USING MEDTRONIC SIMUFORM SEMI-RIGID ANNULOPLASTY RING (N/A Chest) TRANSESOPHAGEAL ECHOCARDIOGRAM (TEE) (N/A )     Patient location during evaluation: SICU Anesthesia Type: General Level of consciousness: sedated Pain management: pain level controlled Vital Signs Assessment: post-procedure vital signs reviewed and stable Respiratory status: patient remains intubated per anesthesia plan Cardiovascular status: stable Postop Assessment: no apparent nausea or vomiting Anesthetic complications: no   No complications documented.  Last Vitals:  Vitals:   03/21/21 1430 03/21/21 1445  BP:    Pulse: 86 88  Resp: 18 18  Temp: (!) 35.7 C (!) 36 C  SpO2: 100% 100%    Last Pain:  Vitals:   03/21/21 0318  TempSrc: Oral  PainSc:                  Jerome Mccormick

## 2021-03-22 ENCOUNTER — Other Ambulatory Visit: Payer: Self-pay

## 2021-03-22 ENCOUNTER — Inpatient Hospital Stay (HOSPITAL_COMMUNITY): Payer: Medicaid Other

## 2021-03-22 ENCOUNTER — Encounter (HOSPITAL_COMMUNITY): Payer: Self-pay | Admitting: Thoracic Surgery (Cardiothoracic Vascular Surgery)

## 2021-03-22 DIAGNOSIS — Z9889 Other specified postprocedural states: Secondary | ICD-10-CM

## 2021-03-22 DIAGNOSIS — I5031 Acute diastolic (congestive) heart failure: Secondary | ICD-10-CM

## 2021-03-22 DIAGNOSIS — D631 Anemia in chronic kidney disease: Secondary | ICD-10-CM

## 2021-03-22 DIAGNOSIS — I340 Nonrheumatic mitral (valve) insufficiency: Secondary | ICD-10-CM

## 2021-03-22 DIAGNOSIS — N059 Unspecified nephritic syndrome with unspecified morphologic changes: Secondary | ICD-10-CM

## 2021-03-22 LAB — ACID FAST SMEAR (AFB, MYCOBACTERIA): Acid Fast Smear: NEGATIVE

## 2021-03-22 LAB — CBC
HCT: 26.8 % — ABNORMAL LOW (ref 39.0–52.0)
HCT: 28.2 % — ABNORMAL LOW (ref 39.0–52.0)
Hemoglobin: 8.7 g/dL — ABNORMAL LOW (ref 13.0–17.0)
Hemoglobin: 9 g/dL — ABNORMAL LOW (ref 13.0–17.0)
MCH: 29.2 pg (ref 26.0–34.0)
MCH: 29 pg (ref 26.0–34.0)
MCHC: 32.5 g/dL (ref 30.0–36.0)
MCHC: 31.9 g/dL (ref 30.0–36.0)
MCV: 89.9 fL (ref 80.0–100.0)
MCV: 91 fL (ref 80.0–100.0)
Platelets: 248 10*3/uL (ref 150–400)
Platelets: 252 10*3/uL (ref 150–400)
RBC: 2.98 MIL/uL — ABNORMAL LOW (ref 4.22–5.81)
RBC: 3.1 MIL/uL — ABNORMAL LOW (ref 4.22–5.81)
RDW: 15.8 % — ABNORMAL HIGH (ref 11.5–15.5)
RDW: 15.7 % — ABNORMAL HIGH (ref 11.5–15.5)
WBC: 9 10*3/uL (ref 4.0–10.5)
WBC: 10.9 10*3/uL — ABNORMAL HIGH (ref 4.0–10.5)
nRBC: 0 % (ref 0.0–0.2)
nRBC: 0 % (ref 0.0–0.2)

## 2021-03-22 LAB — GLUCOSE, CAPILLARY
Glucose-Capillary: 101 mg/dL — ABNORMAL HIGH (ref 70–99)
Glucose-Capillary: 106 mg/dL — ABNORMAL HIGH (ref 70–99)
Glucose-Capillary: 107 mg/dL — ABNORMAL HIGH (ref 70–99)
Glucose-Capillary: 114 mg/dL — ABNORMAL HIGH (ref 70–99)
Glucose-Capillary: 123 mg/dL — ABNORMAL HIGH (ref 70–99)

## 2021-03-22 LAB — BASIC METABOLIC PANEL
Anion gap: 7 (ref 5–15)
Anion gap: 8 (ref 5–15)
BUN: 30 mg/dL — ABNORMAL HIGH (ref 6–20)
BUN: 28 mg/dL — ABNORMAL HIGH (ref 6–20)
CO2: 25 mmol/L (ref 22–32)
CO2: 25 mmol/L (ref 22–32)
Calcium: 7.9 mg/dL — ABNORMAL LOW (ref 8.9–10.3)
Calcium: 8.1 mg/dL — ABNORMAL LOW (ref 8.9–10.3)
Chloride: 104 mmol/L (ref 98–111)
Chloride: 102 mmol/L (ref 98–111)
Creatinine, Ser: 1.75 mg/dL — ABNORMAL HIGH (ref 0.61–1.24)
Creatinine, Ser: 1.68 mg/dL — ABNORMAL HIGH (ref 0.61–1.24)
GFR, Estimated: 53 mL/min — ABNORMAL LOW (ref >60)
GFR, Estimated: 56 mL/min — ABNORMAL LOW (ref >60)
Glucose, Bld: 114 mg/dL — ABNORMAL HIGH (ref 70–99)
Glucose, Bld: 104 mg/dL — ABNORMAL HIGH (ref 70–99)
Potassium: 4.6 mmol/L (ref 3.5–5.1)
Potassium: 4.3 mmol/L (ref 3.5–5.1)
Sodium: 136 mmol/L (ref 135–145)
Sodium: 135 mmol/L (ref 135–145)

## 2021-03-22 LAB — PREPARE FRESH FROZEN PLASMA

## 2021-03-22 LAB — BPAM FFP
Blood Product Expiration Date: 202204212359
Blood Product Expiration Date: 202204262359
ISSUE DATE / TIME: 202204211236
ISSUE DATE / TIME: 202204211236
Unit Type and Rh: 6200
Unit Type and Rh: 6200

## 2021-03-22 LAB — MAGNESIUM
Magnesium: 2.8 mg/dL — ABNORMAL HIGH (ref 1.7–2.4)
Magnesium: 2.6 mg/dL — ABNORMAL HIGH (ref 1.7–2.4)

## 2021-03-22 MED ORDER — METOCLOPRAMIDE HCL 5 MG/ML IJ SOLN
10.0000 mg | Freq: Four times a day (QID) | INTRAMUSCULAR | Status: AC
  Administered 2021-03-22 – 2021-03-23 (×4): 10 mg via INTRAVENOUS
  Filled 2021-03-22 (×4): qty 2

## 2021-03-22 MED ORDER — SODIUM CHLORIDE 0.9 % IV SOLN
2.0000 g | INTRAVENOUS | Status: DC
  Administered 2021-03-22 – 2021-03-26 (×26): 2 g via INTRAVENOUS
  Filled 2021-03-22 (×2): qty 2000
  Filled 2021-03-22: qty 2
  Filled 2021-03-22: qty 2000
  Filled 2021-03-22: qty 2
  Filled 2021-03-22 (×6): qty 2000
  Filled 2021-03-22 (×2): qty 2
  Filled 2021-03-22: qty 2000
  Filled 2021-03-22: qty 2
  Filled 2021-03-22 (×3): qty 2000
  Filled 2021-03-22: qty 2
  Filled 2021-03-22 (×2): qty 2000
  Filled 2021-03-22 (×2): qty 2
  Filled 2021-03-22 (×6): qty 2000
  Filled 2021-03-22: qty 2

## 2021-03-22 MED ORDER — METOPROLOL TARTRATE 50 MG PO TABS
50.0000 mg | ORAL_TABLET | Freq: Two times a day (BID) | ORAL | Status: DC
  Administered 2021-03-22 – 2021-03-23 (×2): 50 mg via ORAL
  Filled 2021-03-22 (×2): qty 1

## 2021-03-22 MED ORDER — LORAZEPAM 1 MG PO TABS
1.0000 mg | ORAL_TABLET | Freq: Three times a day (TID) | ORAL | Status: DC | PRN
  Administered 2021-03-22 – 2021-03-23 (×2): 1 mg via ORAL
  Filled 2021-03-22 (×2): qty 1

## 2021-03-22 MED ORDER — METOPROLOL TARTRATE 25 MG PO TABS: 25.0000 mg | ORAL_TABLET | Freq: Two times a day (BID) | ORAL | Status: DC

## 2021-03-22 MED ORDER — ENOXAPARIN SODIUM 30 MG/0.3ML ~~LOC~~ SOLN
30.0000 mg | Freq: Every day | SUBCUTANEOUS | Status: DC
  Administered 2021-03-23 – 2021-03-25 (×3): 30 mg via SUBCUTANEOUS
  Filled 2021-03-22 (×3): qty 0.3

## 2021-03-22 MED ORDER — HYDRALAZINE HCL 20 MG/ML IJ SOLN
10.0000 mg | Freq: Four times a day (QID) | INTRAMUSCULAR | Status: DC | PRN
  Administered 2021-03-22 – 2021-03-23 (×3): 10 mg via INTRAVENOUS
  Filled 2021-03-22 (×3): qty 1

## 2021-03-22 MED ORDER — AMLODIPINE BESYLATE 5 MG PO TABS
5.0000 mg | ORAL_TABLET | Freq: Every day | ORAL | Status: DC
  Administered 2021-03-22 – 2021-03-24 (×3): 5 mg via ORAL
  Filled 2021-03-22 (×3): qty 1

## 2021-03-22 MED ORDER — INSULIN ASPART 100 UNIT/ML ~~LOC~~ SOLN: 0.0000 [IU] | SUBCUTANEOUS | Status: DC

## 2021-03-22 MED FILL — Lidocaine HCl Local Preservative Free (PF) Inj 2%: INTRAMUSCULAR | Qty: 15 | Status: AC

## 2021-03-22 MED FILL — Thrombin (Recombinant) For Soln 20000 Unit: CUTANEOUS | Qty: 1 | Status: AC

## 2021-03-22 MED FILL — Heparin Sodium (Porcine) Inj 1000 Unit/ML: INTRAMUSCULAR | Qty: 30 | Status: AC

## 2021-03-22 MED FILL — Potassium Chloride Inj 2 mEq/ML: INTRAVENOUS | Qty: 40 | Status: AC

## 2021-03-22 NOTE — Discharge Summary (Incomplete)
Physician Discharge Summary  Patient ID: Jerome Mccormick MRN: [id number] DOB/AGE: [birth year] 30 y.o.  Admit date: 03/19/2021 Discharge date: 03/22/2021  Admission Diagnoses:  Discharge Diagnoses:  Principal Problem:   Bacterial endocarditis Active Problems:   AKI (acute kidney injury) (HCC)   Anemia   Cardiac murmur   Nonrheumatic mitral valve regurgitation   Acute diastolic CHF (congestive heart failure) (HCC)   History of anemia due to chronic kidney disease   Arm DVT (deep venous thromboembolism), acute, right (HCC)   S/P mitral valve repair   S/P MVR (mitral valve repair)   Post-infectious glomerulonephritis   Patient Active Problem List   Diagnosis Date Noted  . Post-infectious glomerulonephritis   . 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 S/P MVR (mitral valve repair) 03/21/2021  . Arm DVT (deep venous thromboembolism), acute, right (HCC) 03/19/2021  . Acute diastolic CHF (congestive heart failure) (HCC) 03/16/2021  . History of anemia due to chronic kidney disease 03/16/2021  . Nicotine dependence 03/16/2021  . Hyperkalemia 03/16/2021  . Acute glomerulonephritis syndrome 03/16/2021  . Mitral valve disease   . Cardiac murmur 03/05/2021  . 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 Nonrheumatic mitral valve regurgitation   . Anemia 03/03/2021  . AKI (acute kidney injury) (HCC) 03/02/2021    Discharged Condition: {condition:18240}  Hospital Course: ***  Consults: {consultation:18241}  Significant Diagnostic Studies: {diagnostics:18242}  Treatments: {Tx:18249}  Discharge Exam: Blood pressure (!) 131/107, pulse (!) 103, temperature 98.4 F (36.9 C), resp. rate 16, height 5\' 7"  (1.702 m), weight 82.3 kg, SpO2 95 %. {physical  Disposition:  There are no questions and answers to display.         Allergies as of 03/22/2021   No Known Allergies   Med Rec must be completed prior to using this [facility]***       Follow-up Information    [facility], MD Follow up.   Specialty: Cardiothoracic Surgery Contact information: [address] [place] [phone]        [phone], MD Follow up.   Specialties: Cardiology, Radiology Contact information: [address] [place] [address]               Signed: [phone] 03/22/2021, 12:50 PM

## 2021-03-22 NOTE — Progress Notes (Signed)
PHARMACY NOTE:  ANTIMICROBIAL RENAL DOSAGE ADJUSTMENT  Current antimicrobial regimen includes a mismatch between antimicrobial dosage and estimated renal function.  As per policy approved by the Pharmacy & Therapeutics and Medical Executive Committees, the antimicrobial dosage will be adjusted accordingly.  Current antimicrobial dosage:  Ampicillin 2 gm every 6 hours   Indication: Enterococcal endocarditis   Renal Function:  Estimated Creatinine Clearance: 63.4 mL/min (A) (by C-G formula based on SCr of 1.75 mg/dL (H)). []      On intermittent HD, scheduled: []      On CRRT    Antimicrobial dosage has been changed to:  Ampicillin 2 gm every 4 hours   Additional comments:   Thank you for allowing pharmacy to be a part of this patient's care.  , PharmD, BCPS, BCIDP Infectious Diseases Clinical Pharmacist Phone: [phone] 03/22/2021 7:59 AM

## 2021-03-22 NOTE — Progress Notes (Addendum)
[facility] DAILY ICU PROGRESS NOTE                   [address]            [place] [id number]          [phone]   1 Day Post-Op Procedure(s) (LRB): MITRAL VALVE REPAIR (MVR) USING MEDTRONIC SIMUFORM SEMI-RIGID ANNULOPLASTY RING (N/A) TRANSESOPHAGEAL ECHOCARDIOGRAM (TEE) (N/A)  Total Length of Stay:  LOS: 3 days   Subjective: Extubated ~4:30pm yesterday.  Awake and alert, c/o soreness.  No acute events over night.   Objective: Vital signs in last 24 hours: Temp:  [95.36 F (35.2 C)-98.24 F (36.8 C)] 97.52 F (36.4 C) (04/22 0700) Pulse Rate:  [80-102] 91 (04/22 0700) Cardiac Rhythm: Normal sinus rhythm (04/22 0400) Resp:  [6-25] 24 (04/22 0700) BP: (109-129)/(76-90) 113/87 (04/22 0700) SpO2:  [98 %-100 %] 100 % (04/22 0700) Arterial Line BP: (117-203)/(65-157) 128/76 (04/22 0700) FiO2 (%):  [40 %-50 %] 40 % (04/21 1608) Weight:  [82.3 kg] 82.3 kg (04/22 0628)  Filed Weights   03/20/21 0404 03/21/21 0318 03/22/21 0628  Weight: 79.4 kg 78 kg 82.3 kg    Weight change: 4.264 kg   Hemodynamic parameters for last 24 hours: PAP: (18-43)/(9-31) 27/15 CO:  [5.4 L/min-6.7 L/min] 6.3 L/min CI:  [2.9 L/min/m2-3.5 L/min/m2] 3.3 L/min/m2  Intake/Output from previous day: 04/21 0701 - 04/22 0700 In: 6632.4 [P.O.:720; I.V.:4020.6; Blood:686; IV Piggyback:1205.8] Out: 3503 [Urine:2080; Blood:723; Chest Tube:700]  Intake/Output this shift: No intake/output data recorded.  Current Meds: Scheduled Meds: . sodium chloride   Intravenous Once  . acetaminophen  1,000 mg Oral Q6H  . aspirin EC  325 mg Oral Daily  . bisacodyl  10 mg Oral Daily   Or  . bisacodyl  10 mg Rectal Daily  . chlorhexidine  15 mL Mouth Rinse BID  . Chlorhexidine Gluconate Cloth  6 each Topical Q0600  . docusate sodium  200 mg Oral Daily  . [START ON 03/23/2021] enoxaparin (LOVENOX) injection  30 mg Subcutaneous QHS  . insulin aspart  0-24 Units Subcutaneous Q4H  . mouth rinse  15  mL Mouth Rinse q12n4p  . metoCLOPramide (REGLAN) injection  10 mg Intravenous Q6H  . metoprolol tartrate  12.5 mg Oral BID  . [START ON 03/23/2021] pantoprazole  40 mg Oral Daily  . sodium chloride flush  3 mL Intravenous Q12H   Continuous Infusions: . sodium chloride    . ampicillin (OMNIPEN) IV Stopped (03/22/21 0504)  . cefTRIAXone (ROCEPHIN)  IV Stopped (03/21/21 2324)  . lactated ringers Stopped (03/21/21 1330)  . lactated ringers 20 mL/hr at 03/22/21 0700  . nitroGLYCERIN Stopped (03/22/21 0613)   PRN Meds:.metoprolol tartrate, morphine injection, ondansetron (ZOFRAN) IV, oxyCODONE, sodium chloride flush, traMADol  General appearance: alert, cooperative and mild distress Neurologic: intact Heart: RRR, no murmur. Monitor showing NSR.  Lungs: clear to auscultation bilaterally and shallow Abdomen: soft, absent bowel sounds, mild tenderness. Has significant gastric distension on CXR.  Extremities: all warm and well perfused, SCD in place to LE's.    Wound: The sternotomy incision is coverred with a dry dressing.   Lab Results: CBC: Recent Labs    03/21/21 1934 03/22/21 0437  WBC 8.4 9.0  HGB 8.4* 8.7*  HCT 26.2* 26.8*  PLT 237 248   BMET:  Recent Labs    03/21/21 1934 03/22/21 0437  NA 141 136  K 4.3 4.6  CL 107 104  CO2 26 25  GLUCOSE 121* 114*  BUN 32* 30*  CREATININE 1.80* 1.75*  CALCIUM 7.7* 7.9*    CMET: Lab Results  Component Value Date   WBC 9.0 03/22/2021   HGB 8.7 (L) 03/22/2021   HCT 26.8 (L) 03/22/2021   PLT 248 03/22/2021   GLUCOSE 114 (H) 03/22/2021   CHOL 200 02/23/2014   TRIG 104.0 02/23/2014   HDL 71.80 02/23/2014   LDLCALC 107 (H) 02/23/2014   ALT 56 (H) 03/21/2021   AST 62 (H) 03/21/2021   NA 136 03/22/2021   K 4.6 03/22/2021   CL 104 03/22/2021   CREATININE 1.75 (H) 03/22/2021   BUN 30 (H) 03/22/2021   CO2 25 03/22/2021   INR 1.5 (H) 03/21/2021   HGBA1C 4.9 03/21/2021      PT/INR:  Recent Labs    03/21/21 1346  LABPROT  17.7*  INR 1.5*   Radiology: [facility] Chest Port 1 View  Result Date: 03/21/2021 CLINICAL DATA:  Status post mitral valve repair. EXAM: PORTABLE CHEST 1 VIEW COMPARISON:  Chest radiograph from one day prior. FINDINGS: Endotracheal tube tip is 4.7 cm above the carina. Enteric tube enters stomach with the tip not seen on this image. Right PICC terminates over the cavoatrial junction. Left internal jugular Swan-Ganz catheter terminates over the right lower lung. Mitral annuloplasty ring in place. Mediastinal drains and left chest tube are in place. Stable cardiomediastinal silhouette with mild cardiomegaly. Probable tiny 1-2% left apical pneumothorax. No right pneumothorax. No pleural effusion. Borderline mild pulmonary edema. Mild left basilar atelectasis. IMPRESSION: 1. Left internal jugular Swan-Ganz catheter terminates within a right lower lobe pulmonary artery branch, consider retracting 6-8 cm. Otherwise well-positioned support structures. 2. Probable tiny 1-2% left apical pneumothorax. Left chest tube in place. 3. Borderline mild congestive heart failure. These results will be called to the ordering clinician or representative by the Radiologist Assistant, and communication documented in the PACS or [employer]. Electronically Signed   By: Delbert Phenix M.D.   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 Hemodynamics and cardiac rhythm are stable. D/C PA catheter and arterial line today, mobilize.  Per ID: Continue ampicillin and ceftriaxone x 6 weeks from 03/22/21..   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 Creat and K+ stable and UO 50-135ml/hr. Avoiding nephrotoxic agents and hypotension. Monitor  closely.  -Gastric distension- Mobilize, adding Reglan IV q6h.   -Expected acute blood loss anemia- Tolerating well, monitor.   -DVT- had a small amout of "non-occlusive" thrombus in right cephalic and brachial veins related to the PICC. This is improving, no significant hand swelling. PICC remains in place.   -DVT PPX- to begin daily [place] enoxaparin this evening.     Leary Roca, PA-C [phone] 03/22/2021 7:41 AM    I have seen and examined the patient and agree with the assessment and plan as outlined.  Doing well POD1.  Maintaining NSR w/ stable hemodynamics on IV NTG for hypertension.  Breathing comfortably on nasal cannula and CXR clear although stomach is dilated w/ gas.  Mobilize.  Diuresis.  Add reglan and advance diet slowly.  Resume ampicillin + Rocephin for 6 weeks.  D/C tubes later today or tomorrow, depending on output.  Will not plan to anticoagulate other than aspirin unless patient develops  recurrent atrial dysrhythmias.  Purcell Nails, MD 03/22/2021 9:12 AM

## 2021-03-22 NOTE — Progress Notes (Addendum)
Subjective: Complaining of chest pain at his incision site.  He was very grateful for all of his care   Antibiotics:  Anti-infectives (From admission, onward)   Start     Dose/Rate Route Frequency Ordered Stop   03/22/21 0900  ampicillin (OMNIPEN) 2 g in sodium chloride 0.9 % 100 mL IVPB        2 g 300 mL/hr over 20 Minutes Intravenous Every 4 hours 03/22/21 0800     03/21/21 0841  vancomycin (VANCOCIN) 1,000 mg in sodium chloride 0.9 % 1,000 mL irrigation  Status:  Discontinued          As needed 03/21/21 0841 03/21/21 1319   03/21/21 0400  vancomycin (VANCOREADY) IVPB 1250 mg/250 mL        1,250 mg 166.7 mL/hr over 90 Minutes Intravenous To Surgery 03/20/21 1018 03/21/21 0908   03/21/21 0400  cefUROXime (ZINACEF) 1.5 g in sodium chloride 0.9 % 100 mL IVPB        1.5 g 200 mL/hr over 30 Minutes Intravenous To Surgery 03/20/21 1018 03/21/21 0815   03/21/21 0400  cefUROXime (ZINACEF) 750 mg in sodium chloride 0.9 % 100 mL IVPB        750 mg 200 mL/hr over 30 Minutes Intravenous To Surgery 03/20/21 1018 03/21/21 1147   03/21/21 0400  vancomycin (VANCOCIN) 1,000 mg in sodium chloride 0.9 % 1,000 mL irrigation  Status:  Discontinued         Irrigation To Surgery 03/20/21 1018 03/21/21 1319   03/19/21 2200  ampicillin (OMNIPEN) 2 g in sodium chloride 0.9 % 100 mL IVPB  Status:  Discontinued        2 g 300 mL/hr over 20 Minutes Intravenous Every 6 hours 03/19/21 1945 03/22/21 0800   03/19/21 2200  cefTRIAXone (ROCEPHIN) 2 g in sodium chloride 0.9 % 100 mL IVPB        2 g 200 mL/hr over 30 Minutes Intravenous Every 12 hours 03/19/21 1945        Medications: Scheduled Meds: . sodium chloride   Intravenous Once  . acetaminophen  1,000 mg Oral Q6H  . aspirin EC  325 mg Oral Daily  . bisacodyl  10 mg Oral Daily   Or  . bisacodyl  10 mg Rectal Daily  . chlorhexidine  15 mL Mouth Rinse BID  . Chlorhexidine Gluconate Cloth  6 each Topical Q0600  . docusate sodium  200 mg  Oral Daily  . [START ON 03/23/2021] enoxaparin (LOVENOX) injection  30 mg Subcutaneous QHS  . insulin aspart  0-24 Units Subcutaneous Q4H  . mouth rinse  15 mL Mouth Rinse q12n4p  . metoCLOPramide (REGLAN) injection  10 mg Intravenous Q6H  . metoprolol tartrate  12.5 mg Oral BID  . [START ON 03/23/2021] pantoprazole  40 mg Oral Daily  . sodium chloride flush  3 mL Intravenous Q12H   Continuous Infusions: . sodium chloride    . ampicillin (OMNIPEN) IV 2 g (03/22/21 1147)  . cefTRIAXone (ROCEPHIN)  IV 200 mL/hr at 03/22/21 1000  . lactated ringers Stopped (03/21/21 1330)  . lactated ringers Stopped (03/22/21 0749)  . nitroGLYCERIN 5 mcg/min (03/22/21 1208)   PRN Meds:.metoprolol tartrate, morphine injection, ondansetron (ZOFRAN) IV, oxyCODONE, sodium chloride flush, traMADol    Objective: Weight change: 4.264 kg  Intake/Output Summary (Last 24 hours) at 03/22/2021 1221 Last data filed at 03/22/2021 1000 Gross per 24 hour  Intake 4650.5 ml  Output 3133 ml  Net  1517.5 ml   Blood pressure (!) 145/111, pulse (!) 108, temperature 98.4 F (36.9 C), resp. rate 19, height 5\' 7"  (1.702 m), weight 82.3 kg, SpO2 99 %. Temp:  [95.36 F (35.2 C)-98.4 F (36.9 C)] 98.4 F (36.9 C) (04/22 1117) Pulse Rate:  [80-117] 108 (04/22 1200) Resp:  [6-26] 19 (04/22 1200) BP: (109-151)/(76-111) 145/111 (04/22 1200) SpO2:  [97 %-100 %] 99 % (04/22 1200) Arterial Line BP: (117-203)/(65-157) 145/82 (04/22 0900) FiO2 (%):  [40 %-50 %] 40 % (04/21 1608) Weight:  [82.3 kg] 82.3 kg (04/22 7829)  Physical Exam: Physical Exam Constitutional:      Appearance: He is well-developed.  HENT:     Head: Normocephalic and atraumatic.  Eyes:     Conjunctiva/sclera: Conjunctivae normal.  Cardiovascular:     Rate and Rhythm: Regular rhythm. Tachycardia present.     Heart sounds: No friction rub. No gallop.   Pulmonary:     Effort: Pulmonary effort is normal. No respiratory distress.     Breath sounds: No  stridor. No wheezing.  Abdominal:     General: There is no distension.     Palpations: Abdomen is soft.  Musculoskeletal:        General: Normal range of motion.     Cervical back: Normal range of motion and neck supple.  Skin:    General: Skin is warm and dry.     Findings: No erythema or rash.  Neurological:     General: No focal deficit present.     Mental Status: He is alert and oriented to person, place, and time.  Psychiatric:        Mood and Affect: Mood normal.        Behavior: Behavior normal.        Thought Content: Thought content normal.        Judgment: Judgment normal.      CBC:    BMET Recent Labs    03/21/21 1934 03/22/21 0437  NA 141 136  K 4.3 4.6  CL 107 104  CO2 26 25  GLUCOSE 121* 114*  BUN 32* 30*  CREATININE 1.80* 1.75*  CALCIUM 7.7* 7.9*     Liver Panel  Recent Labs    03/21/21 0328  PROT 5.4*  ALBUMIN 2.6*  AST 62*  ALT 56*  ALKPHOS 40  BILITOT 0.4       Sedimentation Rate No results for input(s): ESRSEDRATE in the last 72 hours. C-Reactive Protein No results for input(s): CRP in the last 72 hours.  Micro Results: Recent Results (from the past 720 hour(s))  Resp Panel by RT-PCR (Flu A&B, Covid) Nasopharyngeal Swab     Status: None   Collection Time: 03/02/21  4:31 PM   Specimen: Nasopharyngeal Swab; Nasopharyngeal(NP) swabs in vial transport medium  Result Value Ref Range Status   SARS Coronavirus 2 by RT PCR NEGATIVE NEGATIVE Final    Comment: (NOTE) SARS-CoV-2 target nucleic acids are NOT DETECTED.  The SARS-CoV-2 RNA is generally detectable in upper respiratory specimens during the acute phase of infection. The lowest concentration of SARS-CoV-2 viral copies this assay can detect is 138 copies/mL. A negative result does not preclude SARS-Cov-2 infection and should not be used as the sole basis for treatment or other patient management decisions. A negative result may occur with  improper specimen  collection/handling, submission of specimen other than nasopharyngeal swab, presence of viral mutation(s) within the areas targeted by this assay, and inadequate number of viral copies(<138 copies/mL). A negative  result must be combined with clinical observations, patient history, and epidemiological information. The expected result is Negative.  Fact Sheet for Patients:  BloggerCourse.com  Fact Sheet for Healthcare Providers:  SeriousBroker.it  This test is no t yet approved or cleared by the [place] FDA and  has been authorized for detection and/or diagnosis of SARS-CoV-2 by FDA under an Emergency Use Authorization (EUA). This EUA will remain  in effect (meaning this test can be used) for the duration of the COVID-19 declaration under Section 564(b)(1) of the Act, 21 U.S.C.section 360bbb-3(b)(1), unless the authorization is terminated  or revoked sooner.       Influenza A by PCR NEGATIVE NEGATIVE Final   Influenza B by PCR NEGATIVE NEGATIVE Final    Comment: (NOTE) The Xpert Xpress SARS-CoV-2/FLU/RSV plus assay is intended as an aid in the diagnosis of influenza from Nasopharyngeal swab specimens and should not be used as a sole basis for treatment. Nasal washings and aspirates are unacceptable for Xpert Xpress SARS-CoV-2/FLU/RSV testing.  Fact Sheet for Patients: BloggerCourse.com  Fact Sheet for Healthcare Providers: SeriousBroker.it  This test is not yet approved or cleared by the [place] FDA and has been authorized for detection and/or diagnosis of SARS-CoV-2 by FDA under an Emergency Use Authorization (EUA). This EUA will remain in effect (meaning this test can be used) for the duration of the COVID-19 declaration under Section 564(b)(1) of the Act, 21 U.S.C. section 360bbb-3(b)(1), unless the authorization is terminated or revoked.  Performed at [facility], [address] [place], [place] [address]   Group A Strep by PCR     Status: None   Collection Time: 03/05/21  4:46 PM   Specimen: Throat; Sterile Swab  Result Value Ref Range Status   Group A Strep by PCR NOT DETECTED NOT DETECTED Final    Comment: Performed at [facility], [address] [place], [place] [address]  Culture, blood (Routine X 2) w Reflex to ID Panel     Status: Abnormal   Collection Time: 03/05/21  5:46 PM   Specimen: BLOOD  Result Value Ref Range Status   Specimen Description   Final    BLOOD LEFT ANTECUBITAL Performed at [facility], [address] [place], [place] [address]    Special Requests   Final    BOTTLES DRAWN AEROBIC AND ANAEROBIC Blood Culture adequate volume Performed at [facility], [address] [place], [place] [address]    Culture  Setup Time   Final    GRAM POSITIVE COCCI IN BOTH AEROBIC AND ANAEROBIC BOTTLES CRITICAL VALUE NOTED.  VALUE IS CONSISTENT WITH PREVIOUSLY REPORTED AND CALLED VALUE. Performed at [facility], [address], [place], [place] [address]    Culture (A)  Final    ENTEROCOCCUS FAECALIS SUSCEPTIBILITIES PERFORMED ON PREVIOUS CULTURE WITHIN THE LAST 5 DAYS. Performed at [facility], [address], [place], [place] [address]    Report Status 03/08/2021 FINAL  Final  Culture, blood (Routine X 2) w Reflex to ID Panel     Status: Abnormal   Collection Time: 03/05/21  5:50 PM   Specimen: BLOOD  Result Value Ref Range Status   Specimen Description   Final    BLOOD RIGHT ANTECUBITAL Performed at [facility], [address] [place], [place] [address]    Special Requests   Final    BOTTLES DRAWN AEROBIC AND ANAEROBIC Blood Culture adequate volume Performed at [facility], [address] [place], [place] [address]    Culture  Setup Time   Final    Organism ID to follow GRAM POSITIVE COCCI IN BOTH AEROBIC AND ANAEROBIC  BOTTLES CRITICAL RESULT CALLED TO, READ BACK BY AND VERIFIED WITH: SUSAN WATSON AT 1146 03/06/21 SDR Performed at [facility], [address] [place], [place] [address]    Culture ENTEROCOCCUS FAECALIS (A)  Final   Report Status 03/08/2021 FINAL  Final   Organism ID, Bacteria ENTEROCOCCUS FAECALIS  Final      Susceptibility   Enterococcus faecalis - MIC*    AMPICILLIN <=2 SENSITIVE Sensitive     VANCOMYCIN 1 SENSITIVE Sensitive     GENTAMICIN SYNERGY SENSITIVE Sensitive     * ENTEROCOCCUS FAECALIS  Blood Culture ID Panel (Reflexed)     Status: Abnormal   Collection Time: 03/05/21  5:50 PM  Result Value Ref Range Status   Enterococcus faecalis DETECTED (A) NOT DETECTED Final    Comment: CRITICAL RESULT CALLED TO, READ BACK BY AND VERIFIED WITH:  SUSAN WATSON AT 1146 03/06/21 SDR    Enterococcus Faecium NOT DETECTED NOT DETECTED Final   Listeria monocytogenes NOT DETECTED NOT DETECTED Final   Staphylococcus species NOT DETECTED NOT DETECTED Final   Staphylococcus aureus (BCID) NOT DETECTED NOT DETECTED Final   Staphylococcus epidermidis NOT DETECTED NOT DETECTED Final   Staphylococcus lugdunensis NOT DETECTED NOT DETECTED Final   Streptococcus species NOT DETECTED NOT DETECTED Final   Streptococcus agalactiae NOT DETECTED NOT DETECTED Final   Streptococcus pneumoniae NOT DETECTED NOT DETECTED Final   Streptococcus pyogenes NOT DETECTED NOT DETECTED Final   A.calcoaceticus-baumannii NOT DETECTED NOT DETECTED Final   Bacteroides fragilis NOT DETECTED NOT DETECTED Final   Enterobacterales NOT DETECTED NOT DETECTED Final   Enterobacter cloacae complex NOT DETECTED NOT DETECTED Final   Escherichia coli NOT DETECTED NOT DETECTED Final   Klebsiella aerogenes NOT DETECTED NOT DETECTED Final   Klebsiella oxytoca NOT DETECTED NOT DETECTED Final   Klebsiella pneumoniae NOT DETECTED NOT DETECTED Final   Proteus species NOT DETECTED NOT DETECTED Final   Salmonella species NOT  DETECTED NOT DETECTED Final   Serratia marcescens NOT DETECTED NOT DETECTED Final   Haemophilus influenzae NOT DETECTED NOT DETECTED Final   Neisseria meningitidis NOT DETECTED NOT DETECTED Final   Pseudomonas aeruginosa NOT DETECTED NOT DETECTED Final   Stenotrophomonas maltophilia NOT DETECTED NOT DETECTED Final   Candida albicans NOT DETECTED NOT DETECTED Final   Candida auris NOT DETECTED NOT DETECTED Final   Candida glabrata NOT DETECTED NOT DETECTED Final   Candida krusei NOT DETECTED NOT DETECTED Final   Candida parapsilosis NOT DETECTED NOT DETECTED Final   Candida tropicalis NOT DETECTED NOT DETECTED Final   Cryptococcus neoformans/gattii NOT DETECTED NOT DETECTED Final   Vancomycin resistance NOT DETECTED NOT DETECTED Final    Comment: Performed at [facility], [address] [place], [place] [address]  Culture, blood (single) w Reflex to ID Panel     Status: None   Collection Time: 03/07/21  6:31 AM   Specimen: BLOOD  Result Value Ref Range Status   Specimen Description BLOOD  LEFT AC  Final   Special Requests   Final    BOTTLES DRAWN AEROBIC AND ANAEROBIC Blood Culture results may not be optimal due to an excessive volume of blood received in culture bottles   Culture   Final    NO GROWTH 5 DAYS Performed at [facility], [address], [place], [place] [address]  Report Status 03/12/2021 FINAL  Final  CULTURE, BLOOD (ROUTINE X 2) w Reflex to ID Panel     Status: None   Collection Time: 03/09/21  9:44 AM   Specimen: BLOOD  Result Value Ref Range Status   Specimen Description BLOOD LEFT ANTECUBITAL  Final   Special Requests   Final    BOTTLES DRAWN AEROBIC AND ANAEROBIC Blood Culture results may not be optimal due to an excessive volume of blood received in culture bottles   Culture   Final    NO GROWTH 5 DAYS Performed at [facility], [address] [place], [place] [address]    Report Status 03/14/2021 FINAL  Final  CULTURE,  BLOOD (ROUTINE X 2) w Reflex to ID Panel     Status: None   Collection Time: 03/09/21  9:46 AM   Specimen: BLOOD  Result Value Ref Range Status   Specimen Description BLOOD RIGHT ARM  Final   Special Requests   Final    BOTTLES DRAWN AEROBIC AND ANAEROBIC Blood Culture adequate volume   Culture   Final    NO GROWTH 5 DAYS Performed at [facility], [address] [place], [place] [address]    Report Status 03/14/2021 FINAL  Final  Resp Panel by RT-PCR (Flu A&B, Covid) Nasopharyngeal Swab     Status: None   Collection Time: 03/16/21  7:02 AM   Specimen: Nasopharyngeal Swab; Nasopharyngeal(NP) swabs in vial transport medium  Result Value Ref Range Status   SARS Coronavirus 2 by RT PCR NEGATIVE NEGATIVE Final    Comment: (NOTE) SARS-CoV-2 target nucleic acids are NOT DETECTED.  The SARS-CoV-2 RNA is generally detectable in upper respiratory specimens during the acute phase of infection. The lowest concentration of SARS-CoV-2 viral copies this assay can detect is 138 copies/mL. A negative result does not preclude SARS-Cov-2 infection and should not be used as the sole basis for treatment or other patient management decisions. A negative result may occur with  improper specimen collection/handling, submission of specimen other than nasopharyngeal swab, presence of viral mutation(s) within the areas targeted by this assay, and inadequate number of viral copies(<138 copies/mL). A negative result must be combined with clinical observations, patient history, and epidemiological information. The expected result is Negative.  Fact Sheet for Patients:  BloggerCourse.com  Fact Sheet for Healthcare Providers:  SeriousBroker.it  This test is no t yet approved or cleared by the [place] FDA and  has been authorized for detection and/or diagnosis of SARS-CoV-2 by FDA under an Emergency Use Authorization (EUA). This EUA will  remain  in effect (meaning this test can be used) for the duration of the COVID-19 declaration under Section 564(b)(1) of the Act, 21 U.S.C.section 360bbb-3(b)(1), unless the authorization is terminated  or revoked sooner.       Influenza A by PCR NEGATIVE NEGATIVE Final   Influenza B by PCR NEGATIVE NEGATIVE Final    Comment: (NOTE) The Xpert Xpress SARS-CoV-2/FLU/RSV plus assay is intended as an aid in the diagnosis of influenza from Nasopharyngeal swab specimens and should not be used as a sole basis for treatment. Nasal washings and aspirates are unacceptable for Xpert Xpress SARS-CoV-2/FLU/RSV testing.  Fact Sheet for Patients: BloggerCourse.com  Fact Sheet for Healthcare Providers: SeriousBroker.it  This test is not yet approved or cleared by the [place] FDA and has been authorized for detection and/or diagnosis of SARS-CoV-2 by FDA under an Emergency Use Authorization (EUA). This EUA will remain in effect (meaning this test can  be used) for the duration of the COVID-19 declaration under Section 564(b)(1) of the Act, 21 U.S.C. section 360bbb-3(b)(1), unless the authorization is terminated or revoked.  Performed at [facility], [address] [place], [place] [address]   Culture, blood (routine x 2)     Status: None   Collection Time: 03/16/21  2:07 PM   Specimen: BLOOD  Result Value Ref Range Status   Specimen Description BLOOD BLOOD LEFT ARM  Final   Special Requests   Final    BOTTLES DRAWN AEROBIC AND ANAEROBIC Blood Culture adequate volume   Culture   Final    NO GROWTH 5 DAYS Performed at [facility], [address] [place], [place] [address]    Report Status 03/21/2021 FINAL  Final  Culture, blood (routine x 2)     Status: None   Collection Time: 03/16/21  2:07 PM   Specimen: BLOOD  Result Value Ref Range Status   Specimen Description BLOOD BLOOD LEFT HAND  Final   Special  Requests   Final    BOTTLES DRAWN AEROBIC AND ANAEROBIC Blood Culture adequate volume   Culture   Final    NO GROWTH 5 DAYS Performed at [facility], [address] [place], [place] [address]    Report Status 03/21/2021 FINAL  Final  MRSA PCR Screening     Status: None   Collection Time: 03/20/21  5:11 PM   Specimen: Nasopharyngeal  Result Value Ref Range Status   MRSA by PCR NEGATIVE NEGATIVE Final    Comment:        The GeneXpert MRSA Assay (FDA approved for NASAL specimens only), is one component of a comprehensive MRSA colonization surveillance program. It is not intended to diagnose MRSA infection nor to guide or monitor treatment for MRSA infections. Performed at [facility], [address], [place], [place] [address]   Surgical pcr screen     Status: None   Collection Time: 03/20/21 11:48 PM   Specimen: Nasal Mucosa; Nasal Swab  Result Value Ref Range Status   MRSA, PCR NEGATIVE NEGATIVE Final   Staphylococcus aureus NEGATIVE NEGATIVE Final    Comment: (NOTE) The Xpert SA Assay (FDA approved for NASAL specimens in patients 41 years of age and older), is one component of a comprehensive surveillance program. It is not intended to diagnose infection nor to guide or monitor treatment. Performed at [facility], [address] [place], [place] [address]   Aerobic/Anaerobic Culture w Gram Stain (surgical/deep wound)     Status: None (Preliminary result)   Collection Time: 03/21/21 10:24 AM   Specimen: Soft Tissue, Other  Result Value Ref Range Status   Specimen Description TISSUE MITRAL VALVE  Final   Special Requests NONE  Final   Gram Stain   Final    RARE WBC PRESENT, PREDOMINANTLY MONONUCLEAR NO ORGANISMS SEEN    Culture   Final    CULTURE REINCUBATED FOR BETTER GROWTH Performed at [facility], [address], [place], [place] [address]    Report Status PENDING  Incomplete  Aerobic/Anaerobic Culture w Gram Stain  (surgical/deep wound)     Status: None (Preliminary result)   Collection Time: 03/21/21 10:31 AM   Specimen: Soft Tissue, Other  Result Value Ref Range Status   Specimen Description WOUND MITRAL VALVE SPEC B  Final   Special Requests WD MITRAL VALVE  Final   Gram Stain   Final    RARE WBC PRESENT,  PREDOMINANTLY MONONUCLEAR RARE GRAM POSITIVE COCCI    Culture   Final    NO GROWTH < 24 HOURS Performed at [facility], [address], [place], [place] [address]    Report Status PENDING  Incomplete    Studies/Results: DG Orthopantogram  Result Date: 03/20/2021 CLINICAL DATA:  Poor dentition EXAM: ORTHOPANTOGRAM/PANORAMIC COMPARISON:  None. FINDINGS: Panoramic view of the mandible was obtained and reveals no evidence of acute fracture. Temporomandibular joints are within normal limits. No periapical lucency is identified to suggest abscess formation. Multiple amalgam fillings are noted. No significant dental caries are noted on this image. IMPRESSION: No acute abnormality noted. No evidence of periapical abscess. Electronically Signed   By: Alcide Clever M.D.   On: 03/20/2021 18:35   DG Chest 2 View  Result Date: 03/20/2021 CLINICAL DATA:  Poor dentition EXAM: CHEST - 2 VIEW COMPARISON:  03/16/2021 FINDINGS: Cardiac shadow is within normal limits. Linear atelectatic changes are noted in the left mid lung. PICC line is noted with catheter tip at the cavoatrial junction. No focal confluent infiltrate or sizable effusion is seen. No bony abnormality is noted. IMPRESSION: Mild left midlung atelectasis. PICC line stable in appearance as described. Electronically Signed   By: Alcide Clever M.D.   On: 03/20/2021 18:33   MR BRAIN W WO CONTRAST  Result Date: 03/20/2021 CLINICAL DATA:  Endocarditis and headache. EXAM: MRI HEAD WITHOUT AND WITH CONTRAST TECHNIQUE: Multiplanar, multiecho pulse sequences of the brain and surrounding structures were obtained without and with intravenous contrast.  CONTRAST:  8mL GADAVIST GADOBUTROL 1 MMOL/ML IV SOLN COMPARISON:  Head CT 03/02/2021 FINDINGS: Brain: The brainstem and cerebellum are normal. Within both occipital regions, there are small regions of edema. This edema affects the cortical and subcortical brain and is more extensive on the left than the right. Petechial blood products are present in both regions. No mass effect or shift. Cerebral hemispheres are otherwise normal. No restricted diffusion. No hydrocephalus or extra-axial collection. 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 In this patient with possible bacterial endocarditis, the possibility septic emboli does exist but seems less likely. There is a punctate focus of enhancement in the right occipital region but no other abnormal enhancement in those regions or elsewhere. Certainly, we often seen minor changes of enhancement in cases of posterior reversible encephalopathy. Vascular: Major vessels at the base of the brain show flow. Skull and upper cervical spine: Negative Sinuses/Orbits: Clear/normal Other: None IMPRESSION: Abnormal edema affecting the cortical and subcortical brain in both occipital regions, more extensive on the left than the right. Petechial blood products without mass effect or shift. 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 In this patient with possible bacterial endocarditis, the possibility of septic emboli does exist but seems less likely. See above full discussion. Electronically Signed   By: Paulina Fusi M.D.   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 Endotracheal tube and nasogastric tube have been removed. Mediastinal drain and left chest tube remain in place. No pneumothorax. Right arm PICC tip in the SVC above the right  atrium. Swan-Ganz catheter tip in the main pulmonary artery or proximal left pulmonary artery. Slight worsening atelectasis in the lower lobes following extubation, left more than right. Gaseous distension of the stomach. IMPRESSION: 1. Lines and tubes well  positioned. No pneumothorax. 2. Slight worsening of atelectasis in the lower lobes following extubation. 3. Gaseous distension of the stomach. Electronically Signed   By: Paulina Fusi M.D.   On: 03/22/2021 08:26   DG Chest Port 1 View  Result Date: 03/21/2021 CLINICAL DATA:  Status post mitral valve repair. EXAM: PORTABLE CHEST 1 VIEW COMPARISON:  Chest radiograph from one day prior. FINDINGS: Endotracheal tube tip is 4.7 cm above the carina. Enteric tube enters stomach with the tip not seen on this image. Right PICC terminates over the cavoatrial junction. Left internal jugular Swan-Ganz catheter terminates over the right lower lung. Mitral annuloplasty ring in place. Mediastinal drains and left chest tube are in place. Stable cardiomediastinal silhouette with mild cardiomegaly. Probable tiny 1-2% left apical pneumothorax. No right pneumothorax. No pleural effusion. Borderline mild pulmonary edema. Mild left basilar atelectasis. IMPRESSION: 1. Left internal jugular Swan-Ganz catheter terminates within a right lower lobe pulmonary artery branch, consider retracting 6-8 cm. Otherwise well-positioned support structures. 2. Probable tiny 1-2% left apical pneumothorax. Left chest tube in place. 3. Borderline mild congestive heart failure. These results will be called to the ordering clinician or representative by the Radiologist Assistant, and communication documented in the PACS or [employer]. Electronically Signed   By: Delbert Phenix M.D.   On: 03/21/2021 14:28   ECHO INTRAOPERATIVE TEE  Result Date: 03/21/2021  *INTRAOPERATIVE TRANSESOPHAGEAL REPORT *  Patient Name:   CARLUS STAY Date of Exam: 03/21/2021 Medical Rec #:  [id number]      Height:        67.0 in Accession #:    [id number]     Weight:       172.0 lb Date of Birth:  02-15-1991      BSA:          1.90 m Patient Age:    30 years       BP:           137/93 mmHg Patient Gender: M              HR:           80 bpm. Exam Location:  Anesthesiology Transesophogeal exam was perform intraoperatively during surgical procedure. Patient was closely monitored under general anesthesia during the entirety of examination. Indications:     I34.0 Mitral valve insufficiency Sonographer:     Leta Jungling RDCS Performing Phys: [address] OWEN Diagnosing Phys: Gaynelle Adu MD Complications: No known complications during this procedure. POST-OP IMPRESSIONS - Left Ventricle: The left ventricle is unchanged from pre-bypass. - Right Ventricle: The right ventricle appears unchanged from pre-bypass. - Left Atrial Appendage: The left atrial appendage appears unchanged from pre-bypass. - Aortic Valve: The aortic valve appears unchanged from pre-bypass. - Mitral Valve: There is no regurgitation. The mitral valve is status post repair with an annuloplasty ring. The mean gradient across the valve is 7-98mmHg. - Tricuspid Valve: The tricuspid valve appears unchanged from pre-bypass. - Pulmonic Valve: The pulmonic valve appears unchanged from pre-bypass. PRE-OP FINDINGS  Left Ventricle: The left ventricle has normal systolic function, with an ejection fraction of 60-65%. The cavity size was normal. There is no increase in left ventricular wall thickness. There is no left ventricular hypertrophy. Right Ventricle: The right ventricle has normal systolic function. The cavity was normal. There is no increase in right ventricular wall thickness. Left Atrium: Left atrial size was not assessed. No left atrial/left atrial appendage thrombus was detected. Right Atrium: Right atrial size was not  assessed. Interatrial Septum: No atrial level shunt detected by color flow Doppler. Pericardium: Trivial pericardial effusion is present. The  pericardial effusion is posterior to the left ventricle. Mitral Valve: Abnormal. Mitral valve regurgitation is moderate by color flow Doppler. The MR jet is posteriorly-directed. There is a small mobile mitral valve vegetation present on the A1 cusp. Tricuspid Valve: The tricuspid valve was normal in structure. Tricuspid valve regurgitation was not visualized by color flow Doppler. Aortic Valve: The aortic valve is normal in structure. Aortic valve regurgitation was not visualized by color flow Doppler. Pulmonic Valve: The pulmonic valve was normal in structure. Pulmonic valve regurgitation is trivial by color flow Doppler. +--------------+--------++ LEFT VENTRICLE         +--------------+--------++ PLAX 2D                +--------------+--------++ LVIDd:        4.70 cm  +--------------+--------++ LVIDs:        3.10 cm  +--------------+--------++ LVOT diam:    2.00 cm  +--------------+--------++ LV SV:        64 ml    +--------------+--------++ LV SV Index:  33.28    +--------------+--------++ LVOT Area:    3.14 cm +--------------+--------++                        +--------------+--------++ +-------------+----------++ MITRAL VALVE            +--------------+-------+ +-------------+----------++ SHUNTS                MV Peak grad:14.1 mmHg  +--------------+-------+ +-------------+----------++ Systemic Diam:2.00 cm MV Mean grad:9.0 mmHg   +--------------+-------+ +-------------+----------++ MV Vmax:     1.88 m/s   +-------------+----------++ MV Vmean:    139.0 cm/s +-------------+----------++ MV VTI:      0.38 m     +-------------+----------++  Gaynelle AduWilliam Fitzgerald MD Electronically signed by Gaynelle AduWilliam Fitzgerald MD Signature Date/Time: 03/21/2021/2:40:19 PM    Final    VAS US DOPPLER PRE CABG  Result Date: 03/20/2021 PREOPERATIVE VASCULAR EVALUATION  Indications:      Pre-MVR. Comparison Study: No prior studies. Performing Technologist: Jean Rosenthalachel Hodge  RDMS,RVT  Examination Guidelines: A complete evaluation includes B-mode imaging, spectral Doppler, color Doppler, and power Doppler as needed of all accessible portions of each vessel. Bilateral testing is considered an integral part of a complete examination. Limited examinations for reoccurring indications may be performed as noted.  Right Carotid Findings: +----------+--------+--------+--------+--------+--------+           PSV cm/sEDV cm/sStenosisDescribeComments +----------+--------+--------+--------+--------+--------+ CCA Prox  141     29                               +----------+--------+--------+--------+--------+--------+ CCA Distal101     24                               +----------+--------+--------+--------+--------+--------+ ICA Prox  50      20                               +----------+--------+--------+--------+--------+--------+ ICA Distal127     56                      tortuous +----------+--------+--------+--------+--------+--------+ ECA       96                                       +----------+--------+--------+--------+--------+--------+  Portions of this table do not appear on this page. +----------+--------+-------+----------------+------------+           PSV cm/sEDV cmsDescribe        Arm Pressure +----------+--------+-------+----------------+------------+ Subclavian142            Multiphasic, WNL             +----------+--------+-------+----------------+------------+ +---------+--------+--+--------+--+---------+ VertebralPSV cm/s58EDV cm/s19Antegrade +---------+--------+--+--------+--+---------+ Left Carotid Findings: +----------+--------+--------+--------+--------+--------+           PSV cm/sEDV cm/sStenosisDescribeComments +----------+--------+--------+--------+--------+--------+ CCA Prox  158     27                               +----------+--------+--------+--------+--------+--------+ CCA Distal103     20                                +----------+--------+--------+--------+--------+--------+ ICA Prox  75      24                               +----------+--------+--------+--------+--------+--------+ ICA Distal96      47                      tortuous +----------+--------+--------+--------+--------+--------+ ECA       86      16                               +----------+--------+--------+--------+--------+--------+ +----------+--------+--------+----------------+------------+ SubclavianPSV cm/sEDV cm/sDescribe        Arm Pressure +----------+--------+--------+----------------+------------+           136             Multiphasic, WNL             +----------+--------+--------+----------------+------------+ +---------+--------+--+--------+--+---------+ VertebralPSV cm/s71EDV cm/s27Antegrade +---------+--------+--+--------+--+---------+  ABI Findings: +--------+------------------+-----+---------+--------+ Right   Rt Pressure (mmHg)IndexWaveform Comment  +--------+------------------+-----+---------+--------+ Brachial                       triphasicPICC     +--------+------------------+-----+---------+--------+ +--------+------------------+-----+---------+-------+ Left    Lt Pressure (mmHg)IndexWaveform Comment +--------+------------------+-----+---------+-------+ [id number]                    triphasic        +--------+------------------+-----+---------+-------+  Right Doppler Findings: +--------+--------+-----+---------+--------+ Site    PressureIndexDoppler  Comments +--------+--------+-----+---------+--------+ Brachial             triphasicPICC     +--------+--------+-----+---------+--------+ Radial               triphasic         +--------+--------+-----+---------+--------+ Ulnar                triphasic         +--------+--------+-----+---------+--------+  Left Doppler Findings: +--------+--------+-----+---------+--------+ Site     PressureIndexDoppler  Comments +--------+--------+-----+---------+--------+ [id number]          triphasic         +--------+--------+-----+---------+--------+ Radial               triphasic         +--------+--------+-----+---------+--------+ Ulnar                triphasic         +--------+--------+-----+---------+--------+  Summary: Right Carotid: There is no evidence of stenosis in the right ICA.  There was no                evidence of thrombus, dissection, atherosclerotic plaque or                stenosis in the cervical carotid system. Left Carotid: There is no evidence of stenosis in the left ICA. There was no               evidence of thrombus, dissection, atherosclerotic plaque or               stenosis in the cervical carotid system. Vertebrals:  Bilateral vertebral arteries demonstrate antegrade flow. Subclavians: Normal flow hemodynamics were seen in bilateral subclavian              arteries. Right Upper Extremity: Doppler waveforms remain within normal limits with right radial compression. Doppler waveforms remain within normal limits with right ulnar compression. Left Upper Extremity: Doppler waveforms remain within normal limits with left radial compression. Doppler waveforms remain within normal limits with left ulnar compression.  Electronically signed by Heath Lark on 03/20/2021 at 4:34:25 PM.    Final       Assessment/Plan:  INTERVAL HISTORY: Patient is now status post mitral valve repair with complex valvuloplasty and quadrangular resection of inferior anterior leaflet along with [employer] semiformed ring annuloplasty  Principal Problem:   Bacterial endocarditis Active Problems:   AKI (acute kidney injury) (HCC)   Anemia   Cardiac murmur   Nonrheumatic mitral valve regurgitation   Acute diastolic CHF (congestive heart failure) (HCC)   History of anemia due to chronic kidney disease   Arm DVT (deep venous thromboembolism), acute, right (HCC)   S/P  mitral valve repair   S/P MVR (mitral valve repair)    OTHA MONICAL is a 30 y.o. 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  He is undergone mitral valve repair yesterday by Dr. 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  Earlene Plater will check on cultures over the weekend and is available for questions.  My partner (either Dr. Renold Don or Dr. Elinor Parkinson) will followup on Monday   LOS: 3 days   Acey Lav 03/22/2021, 12:21 PM

## 2021-03-22 NOTE — Discharge Instructions (Signed)

## 2021-03-22 NOTE — Progress Notes (Signed)
Progress Note  Patient Name: Jerome Mccormick Date of Encounter: 03/22/2021  [facility] Cardiologist: Debbe Odea, MD   Subjective   Complains of a lot of back pain. No dyspnea.   Inpatient Medications    Scheduled Meds: . sodium chloride   Intravenous Once  . acetaminophen  1,000 mg Oral Q6H  . aspirin EC  325 mg Oral Daily  . bisacodyl  10 mg Oral Daily   Or  . bisacodyl  10 mg Rectal Daily  . chlorhexidine  15 mL Mouth Rinse BID  . Chlorhexidine Gluconate Cloth  6 each Topical Q0600  . docusate sodium  200 mg Oral Daily  . [START ON 03/23/2021] enoxaparin (LOVENOX) injection  30 mg Subcutaneous QHS  . insulin aspart  0-24 Units Subcutaneous Q4H  . mouth rinse  15 mL Mouth Rinse q12n4p  . metoCLOPramide (REGLAN) injection  10 mg Intravenous Q6H  . metoprolol tartrate  12.5 mg Oral BID  . [START ON 03/23/2021] pantoprazole  40 mg Oral Daily  . sodium chloride flush  3 mL Intravenous Q12H   Continuous Infusions: . sodium chloride    . ampicillin (OMNIPEN) IV 2 g (03/22/21 0810)  . cefTRIAXone (ROCEPHIN)  IV 2 g (03/22/21 0932)  . lactated ringers Stopped (03/21/21 1330)  . lactated ringers 20 mL/hr at 03/22/21 0700  . nitroGLYCERIN Stopped (03/22/21 3220)   PRN Meds: metoprolol tartrate, morphine injection, ondansetron (ZOFRAN) IV, oxyCODONE, sodium chloride flush, traMADol   Vital Signs    Vitals:   03/22/21 0530 03/22/21 0545 03/22/21 0628 03/22/21 0700  BP:    113/87  Pulse: 91 88  91  Resp: 16 15  (!) 24  Temp: (!) 97.5 F (36.4 C) (!) 97.5 F (36.4 C)  (!) 97.52 F (36.4 C)  TempSrc:      SpO2: 99% 99%  100%  Weight:   82.3 kg   Height:        Intake/Output Summary (Last 24 hours) at 03/22/2021 1031 Last data filed at 03/22/2021 0800 Gross per 24 hour  Intake 5032.38 ml  Output 3178 ml  Net 1854.38 ml   Last 3 Weights 03/22/2021 03/21/2021 03/20/2021  Weight (lbs) 181 lb 6.4 oz 172 lb 175 lb  Weight (kg) 82.283 kg 78.019 kg 79.379 kg       Telemetry    NSR - Personally Reviewed  ECG    NSR, normal. - Personally Reviewed  Physical Exam   GEN: awake,alert   Neck: No JVD Cardiac: RRR, no murmurs, rubs, or gallops.  Respiratory: Clear to auscultation bilaterally. Mediastinal/Chest tubes in place GI: Soft, nontender, non-distended  MS: No edema; No deformity. Neuro:  Nonfocal  Psych: Normal affect   Labs    High Sensitivity Troponin:   Recent Labs  Lab 03/16/21 0658  TROPONINIHS 8      Chemistry Recent Labs  Lab 03/16/21 0658 03/17/21 0550 03/21/21 0328 03/21/21 0759 03/21/21 1156 03/21/21 1201 03/21/21 1757 03/21/21 1934 03/22/21 0437  NA 138   < > 140   < > 142   < > 141 141 136  K 5.7*   < > 3.9   < > 3.5   < > 4.4 4.3 4.6  CL 107   < > 103   < > 102  --   --  107 104  CO2 23   < > 29  --   --   --   --  26 25  GLUCOSE 97   < >  90   < > 139*  --   --  121* 114*  BUN 69*   < > 39*   < > 34*  --   --  32* 30*  CREATININE 3.26*   < > 2.21*   < > 1.80*  --   --  1.80* 1.75*  CALCIUM 8.9   < > 8.1*  --   --   --   --  7.7* 7.9*  PROT 6.3*  --  5.4*  --   --   --   --   --   --   ALBUMIN 2.6*  --  2.6*  --   --   --   --   --   --   AST 27  --  62*  --   --   --   --   --   --   ALT 23  --  56*  --   --   --   --   --   --   ALKPHOS 33*  --  40  --   --   --   --   --   --   BILITOT 0.5  --  0.4  --   --   --   --   --   --   GFRNONAA 25*   < > 40*  --   --   --   --  51* 53*  ANIONGAP 8   < > 8  --   --   --   --  8 7   < > = values in this interval not displayed.     Hematology Recent Labs  Lab 03/21/21 1346 03/21/21 1625 03/21/21 1757 03/21/21 1934 03/22/21 0437  WBC 10.7*  --   --  8.4 9.0  RBC 2.75*  --   --  2.92* 2.98*  HGB 8.5*  7.9*   < > 7.8* 8.4* 8.7*  HCT 25.0*  24.6*   < > 23.0* 26.2* 26.8*  MCV 89.5  --   --  89.7 89.9  MCH 28.7  --   --  28.8 29.2  MCHC 32.1  --   --  32.1 32.5  RDW 14.9  --   --  15.4 15.8*  PLT 176  --   --  237 248   < > = values in this  interval not displayed.    BNP Recent Labs  Lab 03/16/21 0658  BNP 570.3*     DDimer No results for input(s): DDIMER in the last 168 hours.   Radiology    DG Orthopantogram  Result Date: 03/20/2021 CLINICAL DATA:  Poor dentition EXAM: ORTHOPANTOGRAM/PANORAMIC COMPARISON:  None. FINDINGS: Panoramic view of the mandible was obtained and reveals no evidence of acute fracture. Temporomandibular joints are within normal limits. No periapical lucency is identified to suggest abscess formation. Multiple amalgam fillings are noted. No significant dental caries are noted on this image. IMPRESSION: No acute abnormality noted. No evidence of periapical abscess. Electronically Signed   By: Alcide Clever M.D.   On: 03/20/2021 18:35   DG Chest 2 View  Result Date: 03/20/2021 CLINICAL DATA:  Poor dentition EXAM: CHEST - 2 VIEW COMPARISON:  03/16/2021 FINDINGS: Cardiac shadow is within normal limits. Linear atelectatic changes are noted in the left mid lung. PICC line is noted with catheter tip at the cavoatrial junction. No focal confluent infiltrate or sizable effusion is seen. No bony abnormality is noted. IMPRESSION: Mild  left midlung atelectasis. PICC line stable in appearance as described. Electronically Signed   By: Alcide Clever M.D.   On: 03/20/2021 18:33   MR BRAIN W WO CONTRAST  Result Date: 03/20/2021 CLINICAL DATA:  Endocarditis and headache. EXAM: MRI HEAD WITHOUT AND WITH CONTRAST TECHNIQUE: Multiplanar, multiecho pulse sequences of the brain and surrounding structures were obtained without and with intravenous contrast. CONTRAST:  8mL GADAVIST GADOBUTROL 1 MMOL/ML IV SOLN COMPARISON:  Head CT 03/02/2021 FINDINGS: Brain: The brainstem and cerebellum are normal. Within both occipital regions, there are small regions of edema. This edema affects the cortical and subcortical brain and is more extensive on the left than the right. Petechial blood products are present in both regions. No mass effect  or shift. Cerebral hemispheres are otherwise normal. No restricted diffusion. No hydrocephalus or extra-axial collection. 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 In this patient with possible bacterial endocarditis, the possibility septic emboli does exist but seems less likely. There is a punctate focus of enhancement in the right occipital region but no other abnormal enhancement in those regions or elsewhere. Certainly, we often seen minor changes of enhancement in cases of posterior reversible encephalopathy. Vascular: Major vessels at the base of the brain show flow. Skull and upper cervical spine: Negative Sinuses/Orbits: Clear/normal Other: None IMPRESSION: Abnormal edema affecting the cortical and subcortical brain in both occipital regions, more extensive on the left than the right. Petechial blood products without mass effect or shift. 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 In this patient with possible bacterial endocarditis, the possibility of septic emboli does exist but seems less likely. See above full discussion. Electronically Signed   By: Paulina Fusi M.D.   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 Endotracheal tube and nasogastric tube have been removed. Mediastinal drain and left chest tube remain in place. No pneumothorax. Right arm PICC tip in the SVC above the right atrium. Swan-Ganz catheter tip in the main pulmonary artery or proximal left pulmonary artery. Slight worsening atelectasis in the lower lobes following extubation, left more than right. Gaseous distension of the stomach. IMPRESSION: 1. Lines and tubes well positioned. No pneumothorax. 2. Slight worsening of atelectasis in the lower lobes following extubation. 3. Gaseous distension  of the stomach. Electronically Signed   By: Paulina Fusi M.D.   On: 03/22/2021 08:26   DG Chest Port 1 View  Result Date: 03/21/2021 CLINICAL DATA:  Status post mitral valve repair. EXAM: PORTABLE CHEST 1 VIEW COMPARISON:  Chest radiograph from one day prior. FINDINGS: Endotracheal tube tip is 4.7 cm above the carina. Enteric tube enters stomach with the tip not seen on this image. Right PICC terminates over the cavoatrial junction. Left internal jugular Swan-Ganz catheter terminates over the right lower lung. Mitral annuloplasty ring in place. Mediastinal drains and left chest tube are in place. Stable cardiomediastinal silhouette with mild cardiomegaly. Probable tiny 1-2% left apical pneumothorax. No right pneumothorax. No pleural effusion. Borderline mild pulmonary edema. Mild left basilar atelectasis. IMPRESSION: 1. Left internal jugular Swan-Ganz catheter terminates within a right lower lobe pulmonary artery branch, consider retracting 6-8 cm. Otherwise well-positioned support structures. 2. Probable tiny 1-2% left apical pneumothorax. Left chest tube in place. 3. Borderline mild congestive heart failure. These results will be called to the ordering clinician  or representative by the Radiologist Assistant, and communication documented in the PACS or [employer] Dashboard. Electronically Signed   By: Delbert PhenixJason A Poff M.D.   On: 03/21/2021 14:28   ECHO INTRAOPERATIVE TEE  Result Date: 03/21/2021  *INTRAOPERATIVE TRANSESOPHAGEAL REPORT *  Patient Name:   Marshell LevanCODY L Ekstrand Date of Exam: 03/21/2021 Medical Rec #:  [id number]      Height:       67.0 in Accession #:    [id number]     Weight:       172.0 lb Date of Birth:  July 27, 1991      BSA:          1.90 m Patient Age:    30 years       BP:           137/93 mmHg Patient Gender: M              HR:           80 bpm. Exam Location:  Anesthesiology Transesophogeal exam was perform intraoperatively during surgical procedure. Patient was closely monitored under general  anesthesia during the entirety of examination. Indications:     I34.0 Mitral valve insufficiency Sonographer:     Leta Junglingiffany Cooper RDCS Performing Phys: [address] Diagnosing Phys: Gaynelle AduWilliam Fitzgerald MD Complications: No known complications during this procedure. POST-OP IMPRESSIONS - Left Ventricle: The left ventricle is unchanged from pre-bypass. - Right Ventricle: The right ventricle appears unchanged from pre-bypass. - Left Atrial Appendage: The left atrial appendage appears unchanged from pre-bypass. - Aortic Valve: The aortic valve appears unchanged from pre-bypass. - Mitral Valve: There is no regurgitation. The mitral valve is status post repair with an annuloplasty ring. The mean gradient across the valve is 7-29mmHg. - Tricuspid Valve: The tricuspid valve appears unchanged from pre-bypass. - Pulmonic Valve: The pulmonic valve appears unchanged from pre-bypass. PRE-OP FINDINGS  Left Ventricle: The left ventricle has normal systolic function, with an ejection fraction of 60-65%. The cavity size was normal. There is no increase in left ventricular wall thickness. There is no left ventricular hypertrophy. Right Ventricle: The right ventricle has normal systolic function. The cavity was normal. There is no increase in right ventricular wall thickness. Left Atrium: Left atrial size was not assessed. No left atrial/left atrial appendage thrombus was detected. Right Atrium: Right atrial size was not assessed. Interatrial Septum: No atrial level shunt detected by color flow Doppler. Pericardium: Trivial pericardial effusion is present. The pericardial effusion is posterior to the left ventricle. Mitral Valve: Abnormal. Mitral valve regurgitation is moderate by color flow Doppler. The MR jet is posteriorly-directed. There is a small mobile mitral valve vegetation present on the A1 cusp. Tricuspid Valve: The tricuspid valve was normal in structure. Tricuspid valve regurgitation was not visualized by color flow  Doppler. Aortic Valve: The aortic valve is normal in structure. Aortic valve regurgitation was not visualized by color flow Doppler. Pulmonic Valve: The pulmonic valve was normal in structure. Pulmonic valve regurgitation is trivial by color flow Doppler. +--------------+--------++ LEFT VENTRICLE         +--------------+--------++ PLAX 2D                +--------------+--------++ LVIDd:        4.70 cm  +--------------+--------++ LVIDs:        3.10 cm  +--------------+--------++ LVOT diam:    2.00 cm  +--------------+--------++ LV SV:        64 ml    +--------------+--------++ LV SV Index:  33.28    +--------------+--------++  LVOT Area:    3.14 cm +--------------+--------++                        +--------------+--------++ +-------------+----------++ MITRAL VALVE            +--------------+-------+ +-------------+----------++ SHUNTS                MV Peak grad:14.1 mmHg  +--------------+-------+ +-------------+----------++ Systemic Diam:2.00 cm MV Mean grad:9.0 mmHg   +--------------+-------+ +-------------+----------++ MV Vmax:     1.88 m/s   +-------------+----------++ MV Vmean:    139.0 cm/s +-------------+----------++ MV VTI:      0.38 m     +-------------+----------++  Gaynelle Adu MD Electronically signed by Gaynelle Adu MD Signature Date/Time: 03/21/2021/2:40:19 PM    Final    VAS US DOPPLER PRE CABG  Result Date: 03/20/2021 PREOPERATIVE VASCULAR EVALUATION  Indications:      Pre-MVR. Comparison Study: No prior studies. Performing Technologist: Jean Rosenthal RDMS,RVT  Examination Guidelines: A complete evaluation includes B-mode imaging, spectral Doppler, color Doppler, and power Doppler as needed of all accessible portions of each vessel. Bilateral testing is considered an integral part of a complete examination. Limited examinations for reoccurring indications may be performed as noted.  Right Carotid Findings:  +----------+--------+--------+--------+--------+--------+           PSV cm/sEDV cm/sStenosisDescribeComments +----------+--------+--------+--------+--------+--------+ CCA Prox  141     29                               +----------+--------+--------+--------+--------+--------+ CCA Distal101     24                               +----------+--------+--------+--------+--------+--------+ ICA Prox  50      20                               +----------+--------+--------+--------+--------+--------+ ICA Distal127     56                      tortuous +----------+--------+--------+--------+--------+--------+ ECA       96                                       +----------+--------+--------+--------+--------+--------+ Portions of this table do not appear on this page. +----------+--------+-------+----------------+------------+           PSV cm/sEDV cmsDescribe        Arm Pressure +----------+--------+-------+----------------+------------+ Subclavian142            Multiphasic, WNL             +----------+--------+-------+----------------+------------+ +---------+--------+--+--------+--+---------+ VertebralPSV cm/s58EDV cm/s19Antegrade +---------+--------+--+--------+--+---------+ Left Carotid Findings: +----------+--------+--------+--------+--------+--------+           PSV cm/sEDV cm/sStenosisDescribeComments +----------+--------+--------+--------+--------+--------+ CCA Prox  158     27                               +----------+--------+--------+--------+--------+--------+ CCA Distal103     20                               +----------+--------+--------+--------+--------+--------+ ICA Prox  75      24                               +----------+--------+--------+--------+--------+--------+  ICA Distal96      47                      tortuous +----------+--------+--------+--------+--------+--------+ ECA       86      16                                +----------+--------+--------+--------+--------+--------+ +----------+--------+--------+----------------+------------+ SubclavianPSV cm/sEDV cm/sDescribe        Arm Pressure +----------+--------+--------+----------------+------------+           136             Multiphasic, WNL             +----------+--------+--------+----------------+------------+ +---------+--------+--+--------+--+---------+ VertebralPSV cm/s71EDV cm/s27Antegrade +---------+--------+--+--------+--+---------+  ABI Findings: +--------+------------------+-----+---------+--------+ Right   Rt Pressure (mmHg)IndexWaveform Comment  +--------+------------------+-----+---------+--------+ Brachial                       triphasicPICC     +--------+------------------+-----+---------+--------+ +--------+------------------+-----+---------+-------+ Left    Lt Pressure (mmHg)IndexWaveform Comment +--------+------------------+-----+---------+-------+ [id number]                    triphasic        +--------+------------------+-----+---------+-------+  Right Doppler Findings: +--------+--------+-----+---------+--------+ Site    PressureIndexDoppler  Comments +--------+--------+-----+---------+--------+ Brachial             triphasicPICC     +--------+--------+-----+---------+--------+ Radial               triphasic         +--------+--------+-----+---------+--------+ Ulnar                triphasic         +--------+--------+-----+---------+--------+  Left Doppler Findings: +--------+--------+-----+---------+--------+ Site    PressureIndexDoppler  Comments +--------+--------+-----+---------+--------+ [id number]          triphasic         +--------+--------+-----+---------+--------+ Radial               triphasic         +--------+--------+-----+---------+--------+ Ulnar                triphasic         +--------+--------+-----+---------+--------+  Summary: Right  Carotid: There is no evidence of stenosis in the right ICA. There was no                evidence of thrombus, dissection, atherosclerotic plaque or                stenosis in the cervical carotid system. Left Carotid: There is no evidence of stenosis in the left ICA. There was no               evidence of thrombus, dissection, atherosclerotic plaque or               stenosis in the cervical carotid system. Vertebrals:  Bilateral vertebral arteries demonstrate antegrade flow. Subclavians: Normal flow hemodynamics were seen in bilateral subclavian              arteries. Right Upper Extremity: Doppler waveforms remain within normal limits with right radial compression. Doppler waveforms remain within normal limits with right ulnar compression. Left Upper Extremity: Doppler waveforms remain within normal limits with left radial compression. Doppler waveforms remain within normal limits with left ulnar compression.  Electronically signed by Heath Lark on 03/20/2021 at 4:34:25 PM.    Final     Cardiac Studies   Echo:  03/18/2021  IMPRESSIONS    1. Left ventricular ejection fraction, by estimation, is 60 to 65%. The  left ventricle has normal function. The left ventricle has no regional  wall motion abnormalities. The left ventricular internal cavity size was  mildly dilated. Left ventricular  diastolic parameters are indeterminate.  2. Right ventricular systolic function is normal. The right ventricular  size is normal. There is mildly elevated pulmonary artery systolic  pressure. The estimated right ventricular systolic pressure is 44.2 mmHg.  3. Left atrial size was mildly dilated.  4. The pericardial effusion is circumferential.  5. The mitral valve is abnormal. There is evidence of a ruptured  posterior chordae. Moderate to severe mitral valve regurgitation. No  evidence of mitral stenosis. There is mild holosystolic prolapse of both  leaflets of the mitral valve.  6. The aortic valve is  normal in structure. Aortic valve regurgitation is  not visualized. No aortic stenosis is present.  7. The inferior vena cava is normal in size with greater than 50%  respiratory variability, suggesting right atrial pressure of 3 mmHg.    Patient Profile     30 y.o. 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 Rhona Leavens.  Assessment & Plan     1. Acute on chronic HFpEF with mitral regurgitation with ruptures posterior chordae/possible endocarditis: initial plan for outpatient TCTS evaluation but presented back with progressive DOE and shortness of breath. He was diuresed aggressively at [facility] with significant improvement. Was transitioned from IV lasix to torsemide -- now s/p successful MV repair. Post op day #1 -- receiving antibiotics per ID recommendations -- no murmur.  -- WBC normal.   2. AKI with nephrotic syndrome: followed by nephrology at [facility] -- Cr improving, currently 1.75   3. Acute on chronic anemia: Hgb improved to 8.7  4. Upper extremity DVT 2/2 PICC: IV heparin while at [facility], but now with continued anemia appears this has been held which seems reasonable with small DVT.     For questions or updates, please contact [facility] Please consult [email or web address] for contact info under        Signed, Waynette Towers [place], MD  03/22/2021, 10:31 AM

## 2021-03-22 NOTE — Progress Notes (Signed)
Patient ID: Jerome Mccormick, male   DOB: 16-Dec-1990, 30 y.o.   MRN: [id number]  TCTS Evening Rounds:   Hemodynamically stable   Urine output good  CT output low  CBC    Component Value Date/Time   WBC 10.9 (H) 03/22/2021 1657   RBC 3.10 (L) 03/22/2021 1657   HGB 9.0 (L) 03/22/2021 1657   HGB 15.5 03/11/2014 1644   HCT 28.2 (L) 03/22/2021 1657   HCT 46.6 03/11/2014 1644   PLT 252 03/22/2021 1657   PLT 172 03/11/2014 1644   MCV 91.0 03/22/2021 1657   MCV 97 03/11/2014 1644   MCH 29.0 03/22/2021 1657   MCHC 31.9 03/22/2021 1657   RDW 15.7 (H) 03/22/2021 1657   RDW 13.4 03/11/2014 1644   LYMPHSABS 1.2 03/16/2021 0658   LYMPHSABS 1.9 07/02/2012 0511   MONOABS 0.6 03/16/2021 0658   MONOABS 0.5 07/02/2012 0511   EOSABS 0.1 03/16/2021 0658   EOSABS 0.1 07/02/2012 0511   BASOSABS 0.0 03/16/2021 0658   BASOSABS 0.0 07/02/2012 0511     BMET    Component Value Date/Time   NA 135 03/22/2021 1657   NA 142 03/11/2014 1644   K 4.3 03/22/2021 1657   K 3.7 03/11/2014 1644   CL 102 03/22/2021 1657   CL 109 (H) 03/11/2014 1644   CO2 25 03/22/2021 1657   CO2 27 03/11/2014 1644   GLUCOSE 104 (H) 03/22/2021 1657   GLUCOSE 73 03/11/2014 1644   BUN 28 (H) 03/22/2021 1657   BUN 4 (L) 03/11/2014 1644   CREATININE 1.68 (H) 03/22/2021 1657   CREATININE 0.96 03/11/2014 1644   CALCIUM 8.1 (L) 03/22/2021 1657   CALCIUM 8.6 03/11/2014 1644   GFRNONAA 56 (L) 03/22/2021 1657   GFRNONAA >60 03/11/2014 1644   GFRAA >60 03/11/2014 1644     A/P:  Stable postop course. Renal function continues to improve. Continue current plans

## 2021-03-22 NOTE — Hospital Course (Addendum)
History of Present Illness:    The patient is a 30 year old male who presented originally to [facility] with chief complaint of severe headaches and sinus congestion.he had fevers as high as 102.   He attempted multiple over-the-counter anti-inflammatory medications to try to manage headaches.  He also noted a rash on the back of his calves.  He reported some blood-tinged sputum and did have an associated cough.  He also had some nausea and vomiting as well as fevers and night sweats.  He reported a approximate 20 pound weight loss in the last few months.  He did noted some facial edema and lower extremity edema as well.  He denies IV drug abuse.  He does use occasional marijuana.  He does not have a notable history of significant alcohol use and tobacco abuse.  On admission he was noted to have findings consistent with acute renal failure with a BUN of 73 and creatinine of 4.35.  BUN and creatinine peaked on 03/06/2021 at 79/5.0.  Most recent BUN and creatinine dated 03/20/2021 are 43/2.68.  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  He is also found to have hematuria and hypertension.  Nephrology consultation was obtained and full  diagnostic evaluation  included kidney biopsy for which findings of glomerulonephritis were noted.  It was also felt that there could be some impact on this as related to heavy nonsteroidal anti-inflammatory use.  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  Infectious disease and cardiology consultations were obtained.  Cultures have grown Enterococcus.  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  At that time there was no clear evidence for endocarditis identified.  Physical  examination findings consistent with acute on chronic diastolic heart failure.  Originally an outpatient cardiothoracic surgical appointment was made to see Dr. Cornelius Moras for consideration of valve repair/replacement but following initial discharge he developed worsening shortness of breath/dyspnea on exertion as well as increasing lower extremity edema requiring re-admission..  At the time of his original discharge he was on IV antibiotics at home as well as torsemide for diuresis.  Repeat TEE done 03/18/2021 showed severe MR and patient was transferred to [facility] for CT surgery evaluation as an inpatient.  Patient also has a right arm DVT from PICC line.  He developed a worsening anemia and iron deficiency and has received packed blood cells and iron infusion.  He has no signs of melena and had no signs of retroperitoneal bleed on CT scan.  Most recent abdominal CT scan did not show splenic enlargement as well as small low attenuated splenic lesions.  It also had findings consistent with anasarca and small bilateral pleural effusions.  Full report of the CT scan as well as most recent echocardiogram are listed below.  The patient is currently on intravenous ampicillin and ceftriaxone with plan to duration until 04/16/2021. 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   Hospital course:  The patient was transferred to [facility] for potential surgical intervention.  He was seen by Dr. Cornelius Moras who evaluated the patient and all relevant studies and recommended proceeding with mitral valve repair or possible replacement.  He was additionally seen  in infectious disease consultation to establish further planning from their perspective.  He was felt to be medically stable to proceed and on 03/21/2021 he was taken to the operating room at which time he underwent mitral valve repair with complex valvuloplasty  including quadrangular resection of infected anterior leaflet.  A ring annuloplasty was placed using a Medtronic Simuform ring, size 28 mm, model #7800 RR, serial number [id number].  The patient tolerated procedure well and was taken to the surgical intensive care unit in stable condition.  Postoperative hospital course:  The patient was extubated using standard post cardiac surgical protocols without difficulty.  He has remained hemodynamically stable and Swan-Ganz catheter was removed on postoperative day #1.  Additionally, arterial line was removed and he was started on a course of routine mobilization.  Plans are noted to continue ampicillin and ceftriaxone x6 weeks from initiation date of 03/22/2021.  His renal insufficiency continues to be monitored closely.  Creatinine on postoperative day 1 was actually improved to 1.75.  He is noted to have an expected acute blood loss anemia.  He had an expected acute blood loss anemia after surgery with appropriate gradual recovery of blood counts without intervention. Hypertension was managed with oral agents.  He progressed as expected with physical therapy.  Chest tubes were removed on post-op day 3 and he was transferred to 4E.

## 2021-03-22 NOTE — Plan of Care (Signed)
  Problem: Clinical Measurements: Goal: Ability to maintain clinical measurements within normal limits will improve Outcome: Progressing Goal: Will remain free from infection Outcome: Progressing Goal: Diagnostic test results will improve Outcome: Progressing Goal: Respiratory complications will improve Outcome: Progressing Goal: Cardiovascular complication will be avoided Outcome: Progressing   Problem: Activity: Goal: Risk for activity intolerance will decrease Outcome: Progressing   Problem: Nutrition: Goal: Adequate nutrition will be maintained Outcome: Progressing   Problem: Pain Managment: Goal: General experience of comfort will improve Outcome: Progressing   Problem: Safety: Goal: Ability to remain free from injury will improve Outcome: Progressing   Problem: Skin Integrity: Goal: Risk for impaired skin integrity will decrease Outcome: Progressing   Problem: Activity: Goal: Capacity to carry out activities will improve Outcome: Progressing   Problem: Cardiac: Goal: Ability to achieve and maintain adequate cardiopulmonary perfusion will improve Outcome: Progressing   Problem: Cardiac: Goal: Will achieve and/or maintain hemodynamic stability Outcome: Progressing   Problem: Clinical Measurements: Goal: Postoperative complications will be avoided or minimized Outcome: Progressing   Problem: Respiratory: Goal: Respiratory status will improve Outcome: Progressing   Problem: Skin Integrity: Goal: Wound healing without signs and symptoms of infection Outcome: Progressing Goal: Risk for impaired skin integrity will decrease Outcome: Progressing   Problem: Urinary Elimination: Goal: Ability to achieve and maintain adequate renal perfusion and functioning will improve Outcome: Progressing

## 2021-03-23 ENCOUNTER — Inpatient Hospital Stay (HOSPITAL_COMMUNITY): Payer: Medicaid Other

## 2021-03-23 LAB — BASIC METABOLIC PANEL
Anion gap: 9 (ref 5–15)
BUN: 27 mg/dL — ABNORMAL HIGH (ref 6–20)
CO2: 25 mmol/L (ref 22–32)
Calcium: 8.4 mg/dL — ABNORMAL LOW (ref 8.9–10.3)
Chloride: 102 mmol/L (ref 98–111)
Creatinine, Ser: 1.69 mg/dL — ABNORMAL HIGH (ref 0.61–1.24)
GFR, Estimated: 55 mL/min — ABNORMAL LOW (ref >60)
Glucose, Bld: 103 mg/dL — ABNORMAL HIGH (ref 70–99)
Potassium: 4.5 mmol/L (ref 3.5–5.1)
Sodium: 136 mmol/L (ref 135–145)

## 2021-03-23 LAB — CBC
HCT: 28.9 % — ABNORMAL LOW (ref 39.0–52.0)
Hemoglobin: 9.3 g/dL — ABNORMAL LOW (ref 13.0–17.0)
MCH: 29.1 pg (ref 26.0–34.0)
MCHC: 32.2 g/dL (ref 30.0–36.0)
MCV: 90.3 fL (ref 80.0–100.0)
Platelets: 228 10*3/uL (ref 150–400)
RBC: 3.2 MIL/uL — ABNORMAL LOW (ref 4.22–5.81)
RDW: 15.9 % — ABNORMAL HIGH (ref 11.5–15.5)
WBC: 9.4 10*3/uL (ref 4.0–10.5)
nRBC: 0 % (ref 0.0–0.2)

## 2021-03-23 LAB — GLUCOSE, CAPILLARY
Glucose-Capillary: 104 mg/dL — ABNORMAL HIGH (ref 70–99)
Glucose-Capillary: 102 mg/dL — ABNORMAL HIGH (ref 70–99)

## 2021-03-23 MED ORDER — METOPROLOL TARTRATE 50 MG PO TABS: 75.0000 mg | ORAL_TABLET | Freq: Two times a day (BID) | ORAL | Status: DC

## 2021-03-23 MED ORDER — SODIUM CHLORIDE 0.9 % IV SOLN
INTRAVENOUS | Status: DC | PRN
  Administered 2021-03-23: 250 mL via INTRAVENOUS

## 2021-03-23 MED ORDER — METOPROLOL TARTRATE 100 MG PO TABS
100.0000 mg | ORAL_TABLET | Freq: Two times a day (BID) | ORAL | Status: DC
  Administered 2021-03-23 – 2021-03-26 (×6): 100 mg via ORAL
  Filled 2021-03-23 (×2): qty 1
  Filled 2021-03-23: qty 2
  Filled 2021-03-23 (×2): qty 1
  Filled 2021-03-23: qty 2

## 2021-03-23 MED ORDER — POLYETHYLENE GLYCOL 3350 17 G PO PACK
17.0000 g | PACK | Freq: Every day | ORAL | Status: DC
  Administered 2021-03-23 – 2021-03-24 (×2): 17 g via ORAL
  Filled 2021-03-23 (×2): qty 1

## 2021-03-23 MED ORDER — FUROSEMIDE 10 MG/ML IJ SOLN
40.0000 mg | Freq: Once | INTRAMUSCULAR | Status: AC
  Administered 2021-03-23: 40 mg via INTRAVENOUS
  Filled 2021-03-23: qty 4

## 2021-03-23 MED ORDER — NICOTINE 21 MG/24HR TD PT24
21.0000 mg | MEDICATED_PATCH | Freq: Every day | TRANSDERMAL | Status: DC
  Administered 2021-03-23 – 2021-03-26 (×4): 21 mg via TRANSDERMAL
  Filled 2021-03-23 (×4): qty 1

## 2021-03-23 NOTE — Progress Notes (Signed)
Patient ID: Jerome Mccormick, male   DOB: Mar 05, 1991, 30 y.o.   MRN: [id number] TCTS Evening Rounds:  Still tends to be hypertensive with MAP 105-110. Will increase Lopressor further to 100 bid. Continue Norvasc.  -700 cc today after dose of lasix.  Chest tube output decreasing. 150cc last shift. Will plan to remove in the am.

## 2021-03-23 NOTE — Progress Notes (Signed)
Progress Note  Patient Name: Jerome Mccormick Date of Encounter: 03/23/2021  [facility] Cardiologist: Debbe Odea, MD   Subjective   NAEO. In chair this AM. Feeling better but still with some chest pain related to chest tubes.  Inpatient Medications    Scheduled Meds: . sodium chloride   Intravenous Once  . acetaminophen  1,000 mg Oral Q6H  . amLODipine  5 mg Oral Daily  . aspirin EC  325 mg Oral Daily  . bisacodyl  10 mg Oral Daily   Or  . bisacodyl  10 mg Rectal Daily  . chlorhexidine  15 mL Mouth Rinse BID  . Chlorhexidine Gluconate Cloth  6 each Topical Q0600  . docusate sodium  200 mg Oral Daily  . enoxaparin (LOVENOX) injection  30 mg Subcutaneous QHS  . insulin aspart  0-24 Units Subcutaneous Q4H  . mouth rinse  15 mL Mouth Rinse q12n4p  . metoprolol tartrate  75 mg Oral BID  . pantoprazole  40 mg Oral Daily  . sodium chloride flush  3 mL Intravenous Q12H   Continuous Infusions: . sodium chloride    . sodium chloride 250 mL (03/23/21 0835)  . ampicillin (OMNIPEN) IV 2 g (03/23/21 0837)  . cefTRIAXone (ROCEPHIN)  IV 2 g (03/23/21 0906)  . lactated ringers Stopped (03/21/21 1330)  . lactated ringers Stopped (03/22/21 0749)   PRN Meds: sodium chloride, hydrALAZINE, LORazepam, metoprolol tartrate, morphine injection, ondansetron (ZOFRAN) IV, oxyCODONE, sodium chloride flush, traMADol   Vital Signs    Vitals:   03/23/21 0700 03/23/21 0801 03/23/21 0858 03/23/21 0900  BP: (!) 133/96  (!) 141/102 (!) 143/97  Pulse: (!) 104  99 100  Resp: 20   (!) 22  Temp:  98.7 F (37.1 C)    TempSrc:  Oral    SpO2: 95%   96%  Weight:      Height:        Intake/Output Summary (Last 24 hours) at 03/23/2021 0920 Last data filed at 03/23/2021 0900 Gross per 24 hour  Intake 972.89 ml  Output 2450 ml  Net -1477.11 ml   Last 3 Weights 03/23/2021 03/22/2021 03/21/2021  Weight (lbs) 181 lb 10.5 oz 181 lb 6.4 oz 172 lb  Weight (kg) 82.4 kg 82.283 kg 78.019 kg       Telemetry    Sinus tachycardia - Personally Reviewed  ECG    n/a - Personally Reviewed  Physical Exam   GEN: No acute distress in chair Neck: No JVD Cardiac: RRR, no murmurs, rubs, or gallops. Tachycardic. Chest tubes in place. Warm extremities. Respiratory: Clear to auscultation bilaterally. GI: Soft, nontender, non-distended  MS: No edema; No deformity. Neuro:  Nonfocal  Psych: Normal affect   Labs    High Sensitivity Troponin:   Recent Labs  Lab 03/16/21 0658  TROPONINIHS 8      Chemistry Recent Labs  Lab 03/21/21 0328 03/21/21 0759 03/22/21 0437 03/22/21 1657 03/23/21 0320  NA 140   < > 136 135 136  K 3.9   < > 4.6 4.3 4.5  CL 103   < > 104 102 102  CO2 29   < > 25 25 25   GLUCOSE 90   < > 114* 104* 103*  BUN 39*   < > 30* 28* 27*  CREATININE 2.21*   < > 1.75* 1.68* 1.69*  CALCIUM 8.1*   < > 7.9* 8.1* 8.4*  PROT 5.4*  --   --   --   --  ALBUMIN 2.6*  --   --   --   --   AST 62*  --   --   --   --   ALT 56*  --   --   --   --   ALKPHOS 40  --   --   --   --   BILITOT 0.4  --   --   --   --   GFRNONAA 40*   < > 53* 56* 55*  ANIONGAP 8   < > 7 8 9    < > = values in this interval not displayed.     Hematology Recent Labs  Lab 03/22/21 0437 03/22/21 1657 03/23/21 0320  WBC 9.0 10.9* 9.4  RBC 2.98* 3.10* 3.20*  HGB 8.7* 9.0* 9.3*  HCT 26.8* 28.2* 28.9*  MCV 89.9 91.0 90.3  MCH 29.2 29.0 29.1  MCHC 32.5 31.9 32.2  RDW 15.8* 15.7* 15.9*  PLT 248 252 228    BNPNo results for input(s): BNP, PROBNP in the last 168 hours.   DDimer No results for input(s): DDIMER in the last 168 hours.   Radiology    DG Chest Port 1 View  Result Date: 03/23/2021 CLINICAL DATA:  Atelectasis, follow-up EXAM: PORTABLE CHEST 1 VIEW COMPARISON:  03/22/2021 FINDINGS: Removal of Swan-Ganz catheter. Left IJ Cordis remains in place. Right arm PICC line tip is in the SVC. Mitral valve prosthesis. Mediastinal drain and left chest tubes are again noted. No pneumothorax.  Decreased lung volumes with left base atelectasis is similar to previous exam. No interstitial edema. IMPRESSION: No change in low lung volumes with left base atelectasis. Electronically Signed   By: 03/24/2021 M.D.   On: 03/23/2021 08:08   DG Chest Port 1 View  Result Date: 03/22/2021 CLINICAL DATA:  Follow-up valve replacement EXAM: PORTABLE CHEST 1 VIEW COMPARISON:  03/21/2021 FINDINGS: Prosthetic mitral valve in place. Endotracheal tube and nasogastric tube have been removed. Mediastinal drain and left chest tube remain in place. No pneumothorax. Right arm PICC tip in the SVC above the right atrium. Swan-Ganz catheter tip in the main pulmonary artery or proximal left pulmonary artery. Slight worsening atelectasis in the lower lobes following extubation, left more than right. Gaseous distension of the stomach. IMPRESSION: 1. Lines and tubes well positioned. No pneumothorax. 2. Slight worsening of atelectasis in the lower lobes following extubation. 3. Gaseous distension of the stomach. Electronically Signed   By: 03/23/2021 M.D.   On: 03/22/2021 08:26   DG Chest Port 1 View  Result Date: 03/21/2021 CLINICAL DATA:  Status post mitral valve repair. EXAM: PORTABLE CHEST 1 VIEW COMPARISON:  Chest radiograph from one day prior. FINDINGS: Endotracheal tube tip is 4.7 cm above the carina. Enteric tube enters stomach with the tip not seen on this image. Right PICC terminates over the cavoatrial junction. Left internal jugular Swan-Ganz catheter terminates over the right lower lung. Mitral annuloplasty ring in place. Mediastinal drains and left chest tube are in place. Stable cardiomediastinal silhouette with mild cardiomegaly. Probable tiny 1-2% left apical pneumothorax. No right pneumothorax. No pleural effusion. Borderline mild pulmonary edema. Mild left basilar atelectasis. IMPRESSION: 1. Left internal jugular Swan-Ganz catheter terminates within a right lower lobe pulmonary artery branch, consider  retracting 6-8 cm. Otherwise well-positioned support structures. 2. Probable tiny 1-2% left apical pneumothorax. Left chest tube in place. 3. Borderline mild congestive heart failure. These results will be called to the ordering clinician or representative by the Radiologist Assistant, and communication documented  in the PACS or [employer]. Electronically Signed   By: Delbert Phenix M.D.   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

## 2021-03-23 NOTE — Progress Notes (Signed)
2 Days Post-Op Procedure(s) (LRB): MITRAL VALVE REPAIR (MVR) USING MEDTRONIC SIMUFORM SEMI-RIGID ANNULOPLASTY RING (N/A) TRANSESOPHAGEAL ECHOCARDIOGRAM (TEE) (N/A) Subjective: Some pain from chest wall. Overall feels well.  Objective: Vital signs in last 24 hours: Temp:  [98 F (36.7 C)-99.3 F (37.4 C)] 98.7 F (37.1 C) (04/23 0801) Pulse Rate:  [54-117] 100 (04/23 0900) Cardiac Rhythm: Sinus tachycardia (04/23 0400) Resp:  [13-28] 22 (04/23 0900) BP: (105-151)/(78-111) 143/97 (04/23 0900) SpO2:  [89 %-100 %] 96 % (04/23 0900) Weight:  [82.4 kg] 82.4 kg (04/23 0600)  Hemodynamic parameters for last 24 hours:    Intake/Output from previous day: 04/22 0701 - 04/23 0700 In: 849.9 [I.V.:28.6; IV Piggyback:821.3] Out: 2315 [Urine:1635; Chest Tube:680] Intake/Output this shift: Total I/O In: 123 [P.O.:120; I.V.:3] Out: 270 [Urine:220; Chest Tube:50]  General appearance: alert and cooperative Neurologic: intact Heart: regular rate and rhythm, S1, S2 normal, no murmur Lungs: decreased in bases  Extremities: edema mild Wound: dressing dry  Lab Results: Recent Labs    03/22/21 1657 03/23/21 0320  WBC 10.9* 9.4  HGB 9.0* 9.3*  HCT 28.2* 28.9*  PLT 252 228   BMET:  Recent Labs    03/22/21 1657 03/23/21 0320  NA 135 136  K 4.3 4.5  CL 102 102  CO2 25 25  GLUCOSE 104* 103*  BUN 28* 27*  CREATININE 1.68* 1.69*  CALCIUM 8.1* 8.4*    PT/INR:  Recent Labs    03/21/21 1346  LABPROT 17.7*  INR 1.5*   ABG    Component Value Date/Time   PHART 7.406 03/21/2021 1757   HCO3 28.9 (H) 03/21/2021 1757   TCO2 30 03/21/2021 1757   O2SAT 99.0 03/21/2021 1757   CBG (last 3)  Recent Labs    03/22/21 2349 03/23/21 0323 03/23/21 0735  GLUCAP 106* 104* 102*   Narrative & Impression  CLINICAL DATA:  Atelectasis, follow-up  EXAM: PORTABLE CHEST 1 VIEW  COMPARISON:  03/22/2021  FINDINGS: Removal of Swan-Ganz catheter. Left IJ Cordis remains in  place. Right arm PICC line tip is in the SVC. Mitral valve prosthesis. Mediastinal drain and left chest tubes are again noted. No pneumothorax. Decreased lung volumes with left base atelectasis is similar to previous exam. No interstitial edema.  IMPRESSION: No change in low lung volumes with left base atelectasis.   Electronically Signed   By: Signa Kell M.D.   On: 03/23/2021 08:08    Assessment/Plan: S/P Procedure(s) (LRB): MITRAL VALVE REPAIR (MVR) USING MEDTRONIC SIMUFORM SEMI-RIGID ANNULOPLASTY RING (N/A) TRANSESOPHAGEAL ECHOCARDIOGRAM (TEE) (N/A)  POD 2 Hemodynamically stable with tendency to HTN. Lopressor increased by cardiology this am to 75 bid. Continue Norvasc 5. No ACE I or ARB with kidney function.  Volume excess: -1400 cc yesterday but weight unchanged. 10 lbs over preop. Will give him a dose of lasix this am.  Preop AKI possibly due to acute glomerulonephritis. Creat is stable from yesterday. Follow.  Chest tube output 680 cc yesterday, 350 last 12 hrs. Will keep tubes in for now.  Continue antibiotics for endocarditis.  DC foley and sleeve.  IS, mobilization.   LOS: 4 days    Alleen Borne 03/23/2021

## 2021-03-24 ENCOUNTER — Other Ambulatory Visit: Payer: Self-pay

## 2021-03-24 ENCOUNTER — Inpatient Hospital Stay (HOSPITAL_COMMUNITY): Payer: Medicaid Other

## 2021-03-24 LAB — TYPE AND SCREEN
ABO/RH(D): A POS
Antibody Screen: NEGATIVE
Unit division: 0
Unit division: 0
Unit division: 0
Unit division: 0
Unit division: 0
Unit division: 0

## 2021-03-24 LAB — BPAM RBC
Blood Product Expiration Date: 202205122359
Blood Product Expiration Date: 202205122359
Blood Product Expiration Date: 202205122359
Blood Product Expiration Date: 202205132359
Blood Product Expiration Date: 202205132359
Blood Product Expiration Date: 202205132359
ISSUE DATE / TIME: 202204210733
ISSUE DATE / TIME: 202204210733
ISSUE DATE / TIME: 202204210955
ISSUE DATE / TIME: 202204210955
Unit Type and Rh: 6200
Unit Type and Rh: 6200
Unit Type and Rh: 6200
Unit Type and Rh: 6200
Unit Type and Rh: 6200
Unit Type and Rh: 6200

## 2021-03-24 LAB — BASIC METABOLIC PANEL
Anion gap: 6 (ref 5–15)
BUN: 24 mg/dL — ABNORMAL HIGH (ref 6–20)
CO2: 27 mmol/L (ref 22–32)
Calcium: 8.5 mg/dL — ABNORMAL LOW (ref 8.9–10.3)
Chloride: 102 mmol/L (ref 98–111)
Creatinine, Ser: 1.49 mg/dL — ABNORMAL HIGH (ref 0.61–1.24)
GFR, Estimated: >60 mL/min (ref >60)
Glucose, Bld: 87 mg/dL (ref 70–99)
Potassium: 4.3 mmol/L (ref 3.5–5.1)
Sodium: 135 mmol/L (ref 135–145)

## 2021-03-24 MED ORDER — SODIUM CHLORIDE 0.9% FLUSH: 3.0000 mL | INTRAVENOUS | Status: DC | PRN

## 2021-03-24 MED ORDER — ~~LOC~~ CARDIAC SURGERY, PATIENT & FAMILY EDUCATION: Freq: Once | Status: AC

## 2021-03-24 MED ORDER — SODIUM CHLORIDE 0.9% FLUSH
3.0000 mL | Freq: Two times a day (BID) | INTRAVENOUS | Status: DC
  Administered 2021-03-24 – 2021-03-25 (×2): 3 mL via INTRAVENOUS

## 2021-03-24 MED ORDER — SODIUM CHLORIDE 0.9 % IV SOLN: 250.0000 mL | INTRAVENOUS | Status: DC | PRN

## 2021-03-24 MED ORDER — POTASSIUM CHLORIDE CRYS ER 20 MEQ PO TBCR
20.0000 meq | EXTENDED_RELEASE_TABLET | Freq: Every day | ORAL | Status: AC
  Administered 2021-03-25 – 2021-03-26 (×2): 20 meq via ORAL
  Filled 2021-03-24 (×2): qty 1

## 2021-03-24 MED ORDER — ASPIRIN EC 325 MG PO TBEC
325.0000 mg | DELAYED_RELEASE_TABLET | Freq: Every day | ORAL | Status: DC
  Administered 2021-03-25 – 2021-03-26 (×2): 325 mg via ORAL
  Filled 2021-03-24 (×3): qty 1

## 2021-03-24 MED ORDER — ACETAMINOPHEN 325 MG PO TABS: 650.0000 mg | ORAL_TABLET | Freq: Four times a day (QID) | ORAL | Status: DC | PRN

## 2021-03-24 MED ORDER — FUROSEMIDE 10 MG/ML IJ SOLN
40.0000 mg | Freq: Once | INTRAMUSCULAR | Status: AC
  Administered 2021-03-24: 40 mg via INTRAVENOUS
  Filled 2021-03-24: qty 4

## 2021-03-24 MED ORDER — ONDANSETRON HCL 4 MG PO TABS
4.0000 mg | ORAL_TABLET | Freq: Four times a day (QID) | ORAL | Status: DC | PRN
  Filled 2021-03-24: qty 1

## 2021-03-24 MED ORDER — PANTOPRAZOLE SODIUM 40 MG PO TBEC
40.0000 mg | DELAYED_RELEASE_TABLET | Freq: Every day | ORAL | Status: DC
  Administered 2021-03-25 – 2021-03-26 (×2): 40 mg via ORAL
  Filled 2021-03-24 (×2): qty 1

## 2021-03-24 MED ORDER — POTASSIUM CHLORIDE CRYS ER 20 MEQ PO TBCR
20.0000 meq | EXTENDED_RELEASE_TABLET | Freq: Once | ORAL | Status: AC
  Administered 2021-03-24: 20 meq via ORAL
  Filled 2021-03-24: qty 1

## 2021-03-24 MED ORDER — ONDANSETRON HCL 4 MG/2ML IJ SOLN
4.0000 mg | Freq: Four times a day (QID) | INTRAMUSCULAR | Status: DC | PRN
  Administered 2021-03-24: 4 mg via INTRAVENOUS
  Filled 2021-03-24: qty 2

## 2021-03-24 MED ORDER — FUROSEMIDE 40 MG PO TABS
40.0000 mg | ORAL_TABLET | Freq: Every day | ORAL | Status: AC
  Administered 2021-03-25 – 2021-03-26 (×2): 40 mg via ORAL
  Filled 2021-03-24 (×2): qty 1

## 2021-03-24 NOTE — Progress Notes (Signed)
Progress Note  Patient Name: Jerome Mccormick Date of Encounter: 03/24/2021  [facility] Cardiologist: Debbe Odea, MD   Subjective   NAEO. In chair this AM. Chest tubes to be removed today.  Inpatient Medications    Scheduled Meds: . sodium chloride   Intravenous Once  . acetaminophen  1,000 mg Oral Q6H  . amLODipine  5 mg Oral Daily  . aspirin EC  325 mg Oral Daily  . bisacodyl  10 mg Oral Daily   Or  . bisacodyl  10 mg Rectal Daily  . chlorhexidine  15 mL Mouth Rinse BID  . Chlorhexidine Gluconate Cloth  6 each Topical Q0600  . docusate sodium  200 mg Oral Daily  . enoxaparin (LOVENOX) injection  30 mg Subcutaneous QHS  . mouth rinse  15 mL Mouth Rinse q12n4p  . metoprolol tartrate  100 mg Oral BID  . nicotine  21 mg Transdermal Daily  . pantoprazole  40 mg Oral Daily  . polyethylene glycol  17 g Oral Daily  . sodium chloride flush  3 mL Intravenous Q12H   Continuous Infusions: . sodium chloride    . sodium chloride Stopped (03/24/21 0538)  . ampicillin (OMNIPEN) IV 2 g (03/24/21 0827)  . cefTRIAXone (ROCEPHIN)  IV Stopped (03/23/21 2137)  . lactated ringers Stopped (03/21/21 1330)   PRN Meds: sodium chloride, hydrALAZINE, LORazepam, metoprolol tartrate, morphine injection, ondansetron (ZOFRAN) IV, oxyCODONE, sodium chloride flush, traMADol   Vital Signs    Vitals:   03/24/21 0500 03/24/21 0600 03/24/21 0700 03/24/21 0801  BP: (!) 141/94 (!) 140/94 139/90   Pulse: 90 94 89   Resp: 18 20 17    Temp:    97.7 F (36.5 C)  TempSrc:      SpO2: 94% 95% 96%   Weight: 79.2 kg     Height:        Intake/Output Summary (Last 24 hours) at 03/24/2021 0830 Last data filed at 03/24/2021 0600 Gross per 24 hour  Intake 1462.97 ml  Output 2341 ml  Net -878.03 ml   Last 3 Weights 03/24/2021 03/23/2021 03/22/2021  Weight (lbs) 174 lb 9.7 oz 181 lb 10.5 oz 181 lb 6.4 oz  Weight (kg) 79.2 kg 82.4 kg 82.283 kg      Telemetry    Sinus tachycardia - Personally  Reviewed  ECG    n/a - Personally Reviewed  Physical Exam   GEN: No acute distress in chair Neck: No JVD Cardiac: RRR, no murmurs, rubs, or gallops. Tachycardic. Chest tube in place. Warm extremities. Respiratory: Clear to auscultation bilaterally. GI: Soft, nontender, non-distended  MS: No edema; No deformity. Neuro:  Nonfocal  Psych: Normal affect   Labs    High Sensitivity Troponin:   Recent Labs  Lab 03/16/21 0658  TROPONINIHS 8      Chemistry Recent Labs  Lab 03/21/21 0328 03/21/21 0759 03/22/21 1657 03/23/21 0320 03/24/21 0402  NA 140   < > 135 136 135  K 3.9   < > 4.3 4.5 4.3  CL 103   < > 102 102 102  CO2 29   < > 25 25 27   GLUCOSE 90   < > 104* 103* 87  BUN 39*   < > 28* 27* 24*  CREATININE 2.21*   < > 1.68* 1.69* 1.49*  CALCIUM 8.1*   < > 8.1* 8.4* 8.5*  PROT 5.4*  --   --   --   --   ALBUMIN 2.6*  --   --   --   --  AST 62*  --   --   --   --   ALT 56*  --   --   --   --   ALKPHOS 40  --   --   --   --   BILITOT 0.4  --   --   --   --   GFRNONAA 40*   < > 56* 55* >60  ANIONGAP 8   < > 8 9 6    < > = values in this interval not displayed.     Hematology Recent Labs  Lab 03/22/21 0437 03/22/21 1657 03/23/21 0320  WBC 9.0 10.9* 9.4  RBC 2.98* 3.10* 3.20*  HGB 8.7* 9.0* 9.3*  HCT 26.8* 28.2* 28.9*  MCV 89.9 91.0 90.3  MCH 29.2 29.0 29.1  MCHC 32.5 31.9 32.2  RDW 15.8* 15.7* 15.9*  PLT 248 252 228    BNPNo results for input(s): BNP, PROBNP in the last 168 hours.   DDimer No results for input(s): DDIMER in the last 168 hours.   Radiology    DG CHEST PORT 1 VIEW  Result Date: 03/24/2021 CLINICAL DATA:  Chest tube placement. Status post mitral valve repair EXAM: PORTABLE CHEST 1 VIEW COMPARISON:  03/23/2021 . FINDINGS: Right arm PICC line tip is in the distal SVC. Mediastinal drain and left chest tubes are in place. No pneumothorax visualized. Stable cardiomediastinal contours. Small right pleural effusion is noted. Linear areas of  atelectasis are noted within the left midlung and left base. IMPRESSION: 1. Stable exam. No pneumothorax visualized. 2. Small right pleural effusion. Electronically Signed   By: 03/25/2021 M.D.   On: 03/24/2021 07:48   DG Chest Port 1 View  Result Date: 03/23/2021 CLINICAL DATA:  Atelectasis, follow-up EXAM: PORTABLE CHEST 1 VIEW COMPARISON:  03/22/2021 FINDINGS: Removal of Swan-Ganz catheter. Left IJ Cordis remains in place. Right arm PICC line tip is in the SVC. Mitral valve prosthesis. Mediastinal drain and left chest tubes are again noted. No pneumothorax. Decreased lung volumes with left base atelectasis is similar to previous exam. No interstitial edema. IMPRESSION: No change in low lung volumes with left base atelectasis. Electronically Signed   By: 03/24/2021 M.D.   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

## 2021-03-24 NOTE — Progress Notes (Signed)
Brief ID note:  Notified overnight by pharmacy that mitral valve tissue culture is growing Staph capitis.  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  Ceftriaxone should likely cover it pending sensitivities and so we will continue this antibiotic regimen for now and follow-up susceptibilities.  Dr. Renold Don or Elinor Parkinson will be back tomorrow.    Vedia Coffer for Infectious Disease [facility] 03/24/2021, 8:04 AM

## 2021-03-24 NOTE — Progress Notes (Signed)
3 Days Post-Op Procedure(s) (LRB): MITRAL VALVE REPAIR (MVR) USING MEDTRONIC SIMUFORM SEMI-RIGID ANNULOPLASTY RING (N/A) TRANSESOPHAGEAL ECHOCARDIOGRAM (TEE) (N/A) Subjective: No complaints.  Objective: Vital signs in last 24 hours: Temp:  [97.6 F (36.4 C)-99.1 F (37.3 C)] 97.7 F (36.5 C) (04/24 0801) Pulse Rate:  [83-104] 89 (04/24 0700) Cardiac Rhythm: Normal sinus rhythm (04/24 0400) Resp:  [13-29] 17 (04/24 0700) BP: (114-146)/(71-107) 139/90 (04/24 0700) SpO2:  [92 %-98 %] 96 % (04/24 0700) Weight:  [79.2 kg] 79.2 kg (04/24 0500)  Hemodynamic parameters for last 24 hours:    Intake/Output from previous day: 04/23 0701 - 04/24 0700 In: 1463 [P.O.:480; I.V.:182.7; IV Piggyback:800.3] Out: 2451 [Urine:2100; Stool:1; Chest Tube:350] Intake/Output this shift: No intake/output data recorded.  General appearance: alert and cooperative Neurologic: intact Heart: regular rate and rhythm, S1, S2 normal, no murmur Lungs: clear to auscultation bilaterally Extremities: edema minimal Wound: incision healing well  Lab Results: Recent Labs    03/22/21 1657 03/23/21 0320  WBC 10.9* 9.4  HGB 9.0* 9.3*  HCT 28.2* 28.9*  PLT 252 228   BMET:  Recent Labs    03/23/21 0320 03/24/21 0402  NA 136 135  K 4.5 4.3  CL 102 102  CO2 25 27  GLUCOSE 103* 87  BUN 27* 24*  CREATININE 1.69* 1.49*  CALCIUM 8.4* 8.5*    PT/INR:  Recent Labs    03/21/21 1346  LABPROT 17.7*  INR 1.5*   ABG    Component Value Date/Time   PHART 7.406 03/21/2021 1757   HCO3 28.9 (H) 03/21/2021 1757   TCO2 30 03/21/2021 1757   O2SAT 99.0 03/21/2021 1757   CBG (last 3)  Recent Labs    03/22/21 2349 03/23/21 0323 03/23/21 0735  GLUCAP 106* 104* 102*   CXR: clear  Assessment/Plan: S/P Procedure(s) (LRB): MITRAL VALVE REPAIR (MVR) USING MEDTRONIC SIMUFORM SEMI-RIGID ANNULOPLASTY RING (N/A) TRANSESOPHAGEAL ECHOCARDIOGRAM (TEE) (N/A)  POD 3 Hemodynamically stable with  hypertension postop. Continue Lopressor and Norvasc.  Volume excess: - 1L yesterday but wt dropped 7 lbs. Now about 3 lbs over preop. Continue diuretic.  Preop AKI possibly due to acute glomerulonephritis. Creat is improved to 1.49 today.  Chest tube output continues to decrease. Will remove today.  Continue antibiotics for endocarditis.  IS, mobilization.  Transfer to 4E.   LOS: 5 days    Jerome Mccormick 03/24/2021

## 2021-03-24 NOTE — Progress Notes (Signed)
Patient brought to 4E from 2H. Telemetry box applied, CCMD notified. CHG bath completed. VSS. Patient stated pain scale 0/10. Patient oriented to staff and room. Call bell in reach.  Kenard Gower, RN

## 2021-03-24 NOTE — Progress Notes (Signed)
Mobility Specialist: Progress Note   03/24/21 1435  Mobility  Activity Ambulated in hall  Level of Assistance Independent  Assistive Device None  Distance Ambulated (ft) 1000 ft  Mobility Response Tolerated well  Mobility performed by Mobility specialist  $Mobility charge 1 Mobility   Pre-Mobility: 88 HR Post-Mobility: 92 HR, 130/98 BP, 96% SpO2  Pt c/o 5/10 chest pain during ambulation but was otherwise asx. Pt back to bed after walk with family member present in room.   [facility] Joni Colegrove Mobility Specialist Mobility Specialist Phone: [phone]

## 2021-03-25 ENCOUNTER — Inpatient Hospital Stay: Payer: Self-pay

## 2021-03-25 ENCOUNTER — Inpatient Hospital Stay (HOSPITAL_COMMUNITY): Payer: Medicaid Other

## 2021-03-25 DIAGNOSIS — R011 Cardiac murmur, unspecified: Secondary | ICD-10-CM

## 2021-03-25 LAB — BASIC METABOLIC PANEL
Anion gap: 6 (ref 5–15)
BUN: 21 mg/dL — ABNORMAL HIGH (ref 6–20)
CO2: 27 mmol/L (ref 22–32)
Calcium: 8.4 mg/dL — ABNORMAL LOW (ref 8.9–10.3)
Chloride: 102 mmol/L (ref 98–111)
Creatinine, Ser: 1.47 mg/dL — ABNORMAL HIGH (ref 0.61–1.24)
GFR, Estimated: >60 mL/min (ref >60)
Glucose, Bld: 93 mg/dL (ref 70–99)
Potassium: 4.1 mmol/L (ref 3.5–5.1)
Sodium: 135 mmol/L (ref 135–145)

## 2021-03-25 LAB — CBC
HCT: 27.2 % — ABNORMAL LOW (ref 39.0–52.0)
Hemoglobin: 8.6 g/dL — ABNORMAL LOW (ref 13.0–17.0)
MCH: 29 pg (ref 26.0–34.0)
MCHC: 31.6 g/dL (ref 30.0–36.0)
MCV: 91.6 fL (ref 80.0–100.0)
Platelets: 349 10*3/uL (ref 150–400)
RBC: 2.97 MIL/uL — ABNORMAL LOW (ref 4.22–5.81)
RDW: 16.1 % — ABNORMAL HIGH (ref 11.5–15.5)
WBC: 8.5 10*3/uL (ref 4.0–10.5)
nRBC: 0 % (ref 0.0–0.2)

## 2021-03-25 LAB — GLUCOSE, CAPILLARY: Glucose-Capillary: 109 mg/dL — ABNORMAL HIGH (ref 70–99)

## 2021-03-25 MED ORDER — ASPIRIN 325 MG PO TBEC: 325.0000 mg | DELAYED_RELEASE_TABLET | Freq: Every day | ORAL | 0 refills | Status: AC

## 2021-03-25 MED ORDER — AMLODIPINE BESYLATE 10 MG PO TABS
10.0000 mg | ORAL_TABLET | Freq: Every day | ORAL | Status: DC
  Administered 2021-03-25 – 2021-03-26 (×2): 10 mg via ORAL
  Filled 2021-03-25 (×2): qty 1

## 2021-03-25 MED ORDER — CHLORHEXIDINE GLUCONATE CLOTH 2 % EX PADS
6.0000 | MEDICATED_PAD | Freq: Every day | CUTANEOUS | Status: DC
  Administered 2021-03-26: 6 via TOPICAL

## 2021-03-25 NOTE — Progress Notes (Addendum)
[facility] for Infectious Disease  Date of Admission:  03/19/2021      Total days of antibiotics 21 days  Ampicillin + Ceftriaxone           ASSESSMENT: Jerome Mccormick is a 30 y.o. 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 Roxy Manns. Blood cultures on 4/05 with high burden of enterococcus faecalis, for which he has been getting IV Ceftriaxone BID and continuous Ampicillin infusion at home prior to this current hospitalization. Valve tissue from 1 operative specimen is growing rare staphylococcus capitis that is sensitive to oxacillin - would most likely infer this to be a contaminant, however it is covered under current treatment regimen via ceftriaxone. Will continue treatment for 6 weeks from surgery.   Vascular Access = single lumen PICC in place, would probably be much easier for him to get this replaced with a double lumen catheter to ensure he breaks connections as infrequently as possible between the continuous infusion and BID injections. DVT in this arm previously identified on [facility] for treatment.  Will touch base with CT surgery later this afternoon about switching this out in prep for D/C tomorrow.   Drug monitoring = OPAT orders as outlined below for outpatient management.   AKI 2/2 postinfectious glomerulonephritis = nephrology following, SCr continues to improve.     PLAN: 1. Continue current therapy  2. Will d/w TCTS about exchanging to DL PICC 3. OPAT follow up as outlined below  Will sign off and prepare to continue ambulatory care for Mr. Schirmer.     OPAT ORDERS:  Diagnosis: mitral valve endocarditis s/p replacemetn 4/21  Culture Result: E Faecalis   No Known Allergies   Discharge antibiotics to be given via PICC line:  Per pharmacy protocol Ampicillin IV + BID Ceftriaxone    Duration: 6 weeks   End Date: June 2nd   [place] Per Protocol with [employer] Use: Home health RN for IV administration and teaching,  line care and labs.    Labs weekly while on IV antibiotics: _x_ CBC with differential __ BMP _x_ CMP _x_ CRP __ ESR __ Vancomycin trough __ CK  _x_ Please pull PIC at completion of IV antibiotics __ Please leave PIC in place until doctor has seen patient or been notified  Fax weekly labs to [phone]  Clinic Follow Up Appt: May 12th video visit FU with Dr. Tommy Medal '@9' :30 am     Principal Problem:   Bacterial endocarditis Active Problems:   AKI (acute kidney injury) ([place])   Anemia   Cardiac murmur   Nonrheumatic mitral valve regurgitation   Acute diastolic CHF (congestive heart failure) (HCC)   History of anemia due to chronic kidney disease   Arm DVT (deep venous thromboembolism), acute, right (HCC)   S/P mitral valve repair   S/P MVR (mitral valve repair)   Post-infectious glomerulonephritis   . amLODipine  10 mg Oral Daily  . aspirin EC  325 mg Oral Daily  . chlorhexidine  15 mL Mouth Rinse BID  . Chlorhexidine Gluconate Cloth  6 each Topical Daily  . enoxaparin (LOVENOX) injection  30 mg Subcutaneous QHS  . furosemide  40 mg Oral Daily  . mouth rinse  15 mL Mouth Rinse q12n4p  . metoprolol tartrate  100 mg Oral BID  . nicotine  21 mg Transdermal Daily  . pantoprazole  40 mg Oral QAC breakfast  . potassium chloride  20 mEq Oral  Daily  . sodium chloride flush  3 mL Intravenous Q12H    SUBJECTIVE: Doing well. Swelling much better. Walking around well.  No complaints currently. Ready to go home.    Review of Systems: Review of Systems  Constitutional: Negative for chills and fever.  Respiratory: Negative for cough and shortness of breath.   Cardiovascular: Positive for leg swelling (mild and improved from prior to surgery).  Gastrointestinal: Negative for abdominal pain, diarrhea and vomiting.  Genitourinary: Negative for dysuria.  Musculoskeletal: Negative for back pain and joint pain.  Skin: Negative for rash.    No Known  Allergies  OBJECTIVE: Vitals:   03/24/21 2339 03/25/21 0349 03/25/21 0822 03/25/21 0829  BP: (!) 138/95 (!) 145/94 122/79 122/79  Pulse: 90 95 100 100  Resp: '16 16 15   ' Temp: 98.5 F (36.9 C) 98.8 F (37.1 C) 98.1 F (36.7 C)   TempSrc: Oral Oral Oral   SpO2: 96% 96% 100%   Weight:  79.8 kg    Height:       Body mass index is 27.55 kg/m.  Physical Exam Vitals reviewed.  Constitutional:      Appearance: He is well-developed.     Comments: Seated comfortably on side of bed. No distress. Pleasant.   HENT:     Mouth/Throat:     Dentition: Normal dentition. No dental abscesses.  Cardiovascular:     Rate and Rhythm: Normal rate and regular rhythm.     Heart sounds: Normal heart sounds. No murmur heard.   Pulmonary:     Effort: Pulmonary effort is normal.     Breath sounds: Normal breath sounds.  Chest:     Comments: Well approximated midsternal incision Abdominal:     General: There is no distension.     Palpations: Abdomen is soft.     Tenderness: There is no abdominal tenderness.  Musculoskeletal:     Comments: RUE PICC clean and dry   Lymphadenopathy:     Cervical: No cervical adenopathy.  Skin:    General: Skin is warm and dry.     Capillary Refill: Capillary refill takes less than 2 seconds.     Findings: No rash.  Neurological:     Mental Status: He is alert and oriented to person, place, and time.  Psychiatric:        Judgment: Judgment normal.     Comments: In good spirits today and engaged in care discussion.      Lab Results Lab Results  Component Value Date   WBC 8.5 03/25/2021   HGB 8.6 (L) 03/25/2021   HCT 27.2 (L) 03/25/2021   MCV 91.6 03/25/2021   PLT 349 03/25/2021    Lab Results  Component Value Date   CREATININE 1.47 (H) 03/25/2021   BUN 21 (H) 03/25/2021   NA 135 03/25/2021   K 4.1 03/25/2021   CL 102 03/25/2021   CO2 27 03/25/2021    Lab Results  Component Value Date   ALT 56 (H) 03/21/2021   AST 62 (H) 03/21/2021   ALKPHOS  40 03/21/2021   BILITOT 0.4 03/21/2021     Microbiology: BCx 4/05 >> e faecalis x 2 Valve Tissue 4/21 >> staph capitis (S-oxacillin)   Janene Madeira, MSN, NP-C [facility] [email or web address] Pager: [phone] Office: [phone] [place]: [phone]

## 2021-03-25 NOTE — Progress Notes (Signed)
CARDIAC REHAB PHASE I   Went to offer to walk with pt. Pt states he is getting his EPW d/c's shortly, requesting ambulation after. Will continue to follow.  Reynold Bowen, RN BSN 03/25/2021 9:59 AM

## 2021-03-25 NOTE — Addendum Note (Signed)
Addendum  created 03/25/21 0920 by Adair Laundry, CRNA   Order list changed, Pharmacy for encounter modified

## 2021-03-25 NOTE — Progress Notes (Signed)
Pt pacing wires removed, tolerated well. Tips intact. VSS. Call light within reach.  Kalman Jewels, RN 03/25/2021 11:38 AM

## 2021-03-25 NOTE — Progress Notes (Addendum)
Attempted BP x 15 mins during bedrest post EPW removal.  Patient educated.   Pt states " I feel fine"

## 2021-03-25 NOTE — Progress Notes (Signed)
CARDIAC REHAB PHASE I   Offered to walk with pt. Pt states independent ambulation without difficulty. Hopeful for d/c tomorrow. Denies DME needs. Encouraged continued ambulation and IS use. Will continue to follow.  [phone] Reynold Bowen, RN BSN 03/25/2021 2:07 PM

## 2021-03-25 NOTE — Progress Notes (Addendum)
      [address]       [place] [address]             [phone]      4 Days Post-Op Procedure(s) (LRB): MITRAL VALVE REPAIR (MVR) USING MEDTRONIC SIMUFORM SEMI-RIGID ANNULOPLASTY RING (N/A) TRANSESOPHAGEAL ECHOCARDIOGRAM (TEE) (N/A)   Subjective:  Patient is feeling much better.  He has no specific complaints.  He has BM yesterday  Objective: Vital signs in last 24 hours: Temp:  [97.7 F (36.5 C)-99 F (37.2 C)] 98.8 F (37.1 C) (04/25 0349) Pulse Rate:  [90-100] 95 (04/25 0349) Cardiac Rhythm: Normal sinus rhythm (04/24 2015) Resp:  [14-26] 16 (04/25 0349) BP: (127-145)/(88-106) 145/94 (04/25 0349) SpO2:  [95 %-98 %] 96 % (04/25 0349) Weight:  [79.8 kg] 79.8 kg (04/25 0349)  Intake/Output from previous day: 04/24 0701 - 04/25 0700 In: 962.2 [P.O.:240; I.V.:22.2; IV Piggyback:700] Out: 1085 [Urine:1065; Chest Tube:20]  General appearance: alert, cooperative and no distress Heart: regular rate and rhythm Lungs: clear to auscultation bilaterally Abdomen: soft, non-tender; bowel sounds normal; no masses,  no organomegaly Extremities: edema trace Wound: clean and dry  Lab Results: Recent Labs    03/23/21 0320 03/25/21 0411  WBC 9.4 8.5  HGB 9.3* 8.6*  HCT 28.9* 27.2*  PLT 228 349   BMET:  Recent Labs    03/24/21 0402 03/25/21 0411  NA 135 135  K 4.3 4.1  CL 102 102  CO2 27 27  GLUCOSE 87 93  BUN 24* 21*  CREATININE 1.49* 1.47*  CALCIUM 8.5* 8.4*    PT/INR: No results for input(s): LABPROT, INR in the last 72 hours. ABG    Component Value Date/Time   PHART 7.406 03/21/2021 1757   HCO3 28.9 (H) 03/21/2021 1757   TCO2 30 03/21/2021 1757   O2SAT 99.0 03/21/2021 1757   CBG (last 3)  Recent Labs    03/22/21 2349 03/23/21 0323 03/23/21 0735  GLUCAP 106* 104* 102*    Assessment/Plan: S/P Procedure(s) (LRB): MITRAL VALVE REPAIR (MVR) USING MEDTRONIC SIMUFORM SEMI-RIGID ANNULOPLASTY RING (N/A) TRANSESOPHAGEAL  ECHOCARDIOGRAM (TEE) (N/A)  1. CV- NSR, BP slightly elevated- on Lopressor 100 mg BID, remains mildly HTN- will increase Norvasc to 10 mg daily 2. Pulm- no acute issues, continue IS 3. Renal- creatinine stable at 1.47, weight is stable-on Lasix tablet, potassium 4. ID- Endocarditis- MV tissue growing Staph Capitis- ID following continue on ABX 5. Dispo- patient stable, titrate Norvasc for additional BP control, IV ABX per ID for endocarditis, care per Dr. Cornelius Moras   LOS: 6 days    Lowella Dandy, PA-C 03/25/2021   I have seen and examined the patient and agree with the assessment and plan as outlined.  Tentatively plan d/c home tomorrow as long as appropriate arrangements for home antibiotics are in place.  Will also make sure that patient has an appointment in the dental service clinic.  Purcell Nails, MD 03/25/2021 7:52 AM

## 2021-03-25 NOTE — Progress Notes (Signed)
PHARMACY CONSULT NOTE FOR:  OUTPATIENT  PARENTERAL ANTIBIOTIC THERAPY (OPAT)  Indication: Endocarditis Regimen: Ceftriaxone IV 2g q12h and ampicillin IV 12g continuous infusion End date: 05/02/21  IV antibiotic discharge orders are pended. To discharging provider:  please sign these orders via discharge navigator,  Select New Orders & click on the button choice - Manage This Unsigned Work.     Thank you for allowing pharmacy to be a part of this patient's care.  Sanda Klein, PharmD, RPh  PGY-1 Pharmacy Resident 03/25/2021 1:50 PM  Please check [email or web address] for unit-specific pharmacy phone numbers.

## 2021-03-25 NOTE — Discharge Summary (Signed)
[address]       [place] [address]             [phone]    Physician Discharge Summary  Patient ID: Jerome Mccormick MRN: [id number] DOB/AGE: 04/22/1991 30 y.o.  Admit date: 03/19/2021 Discharge date: 03/26/2021  Admission Diagnoses:  Patient Active Problem List   Diagnosis Date Noted  . Post-infectious glomerulonephritis   . Arm DVT (deep venous thromboembolism), acute, right ([place]) 03/19/2021  . Acute diastolic CHF (congestive heart failure) ([place]) 03/16/2021  . History of anemia due to chronic kidney disease 03/16/2021  . Nicotine dependence 03/16/2021  . Hyperkalemia 03/16/2021  . Acute glomerulonephritis syndrome 03/16/2021  . Mitral valve disease   . Cardiac murmur 03/05/2021  . 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 Nonrheumatic mitral valve regurgitation   . Anemia 03/03/2021  . AKI (acute kidney injury) ([place]) 03/02/2021   Discharge Diagnoses:  Patient Active Problem List   Diagnosis Date Noted  . Post-infectious glomerulonephritis   . 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 S/P MVR (mitral valve repair) 03/21/2021  . Arm DVT (deep venous thromboembolism), acute, right ([place]) 03/19/2021  . Acute diastolic CHF (congestive heart failure) ([place]) 03/16/2021  . History of anemia due to chronic kidney disease 03/16/2021  . Nicotine dependence 03/16/2021  . Hyperkalemia 03/16/2021  . Acute glomerulonephritis syndrome 03/16/2021  . Mitral valve disease   . Cardiac murmur 03/05/2021  . 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 Nonrheumatic mitral valve regurgitation   . Anemia 03/03/2021  . AKI (acute kidney injury) ([place]) 03/02/2021   Discharged Condition: good   History of Present Illness:    The patient is a 30 year old male who presented originally to [facility] with chief complaint of severe headaches and sinus congestion.he had fevers as high as 102.   He attempted multiple over-the-counter anti-inflammatory medications to try to manage  headaches.  He also noted a rash on the back of his calves.  He reported some blood-tinged sputum and did have an associated cough.  He also had some nausea and vomiting as well as fevers and night sweats.  He reported a approximate 20 pound weight loss in the last few months.  He did noted some facial edema and lower extremity edema as well.  He denies IV drug abuse.  He does use occasional marijuana.  He does not have a notable history of significant alcohol use and tobacco abuse.  On admission he was noted to have findings consistent with acute renal failure with a BUN of 73 and creatinine of 4.35.  BUN and creatinine peaked on 03/06/2021 at 79/5.0.  Most recent BUN and creatinine dated 03/20/2021 are 43/2.68.  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  He is also found to have hematuria and hypertension.  Nephrology consultation was obtained and full  diagnostic evaluation  included kidney biopsy for which findings of glomerulonephritis were noted.  It was also felt that there could be some impact on this as related to heavy nonsteroidal anti-inflammatory use.  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  Infectious disease and cardiology consultations were obtained.  Cultures have grown Enterococcus.  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  At that time there was  no clear evidence for endocarditis identified.  Physical examination findings consistent with acute on chronic diastolic heart failure.  Originally an outpatient cardiothoracic surgical appointment was made to see Dr. Roxy Manns for consideration of valve repair/replacement but following initial discharge he developed worsening shortness of breath/dyspnea on  exertion as well as increasing lower extremity edema requiring re-admission..  At the time of his original discharge he was on IV antibiotics at home as well as torsemide for diuresis.  Repeat TEE done 03/18/2021 showed severe MR and patient was transferred to [facility] for CT surgery evaluation as an inpatient.  Patient also has a right arm DVT from PICC line.  He developed a worsening anemia and iron deficiency and has received packed blood cells and iron infusion.  He has no signs of melena and had no signs of retroperitoneal bleed on CT scan.  Most recent abdominal CT scan did not show splenic enlargement as well as small low attenuated splenic lesions.  It also had findings consistent with anasarca and small bilateral pleural effusions.  Full report of the CT scan as well as most recent echocardiogram are listed below.  The patient is currently on intravenous ampicillin and ceftriaxone with plan to duration until 04/16/2021. 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   Hospital course:  The patient was transferred to [facility] for potential surgical intervention.  He was seen by Dr. Roxy Manns who evaluated the patient and all relevant studies and recommended proceeding with mitral valve repair or possible replacement.  He was additionally seen in infectious disease consultation to establish further planning from their perspective.  He was felt to be medically stable to proceed and on 03/21/2021 he was taken to the operating room at which time he underwent mitral valve repair with complex valvuloplasty including quadrangular resection of infected anterior leaflet.  A ring annuloplasty was placed using a Medtronic Simuform ring, size 28 mm, model #7800 RR, serial number [id number].  The patient tolerated procedure well and was taken to the surgical intensive care unit in stable  condition.  Postoperative hospital course:  The patient was extubated using standard post cardiac surgical protocols without difficulty.  He has remained hemodynamically stable and Swan-Ganz catheter was removed on postoperative day #1.  Additionally, arterial line was removed and he was started on a course of routine mobilization.  Plans are noted to continue ampicillin and ceftriaxone x6 weeks from initiation date of 03/22/2021.  His renal insufficiency continues to be monitored closely.  Creatinine on postoperative day 1 was actually improved to 1.75.  He is noted to have an expected acute blood loss anemia.  He had an expected acute blood loss anemia after surgery with appropriate gradual recovery of blood counts without intervention. Hypertension was managed with oral agents.  He progressed as expected with physical therapy.  Chest tubes were removed on post-op day 3 and he was transferred to 4E.  The patient remained clinically stable. He was tachycardic and his Lopressor dose was titrated accordingly.  He remained hypertensive and his Norvasc dose was increased.  His creatinine level improved and remained stable at 1.47.  His OR cultures showed growth of Staph Capitis on his MV tissue.  ID was following and ABX regimen was adjusted to have Ampicillin run continuously and continue with Rocephin dosage.  PICC line is in placed, home health orders have been arranged.  The patients surgical incisions are healing without  evidence of infection.  His pacing wires have been removed.  He is medically stable for discharge home today.  Consults: infectious disease  Significant Diagnostic Studies: cardiac graphics:   Echocardiogram:   IMPRESSIONS    1. Left ventricular ejection fraction, by estimation, is 60 to 65%. The  left ventricle has normal function. The left ventricle has no regional  wall motion abnormalities. The left ventricular internal cavity size was  mildly dilated. Left ventricular   diastolic parameters are indeterminate.  2. Right ventricular systolic function is normal. The right ventricular  size is normal. There is mildly elevated pulmonary artery systolic  pressure. The estimated right ventricular systolic pressure is 56.3 mmHg.  3. Left atrial size was mildly dilated.  4. The pericardial effusion is circumferential.  5. The mitral valve is abnormal. There is evidence of a ruptured  posterior chordae. Moderate to severe mitral valve regurgitation. No  evidence of mitral stenosis. There is mild holosystolic prolapse of both  leaflets of the mitral valve.  6. The aortic valve is normal in structure. Aortic valve regurgitation is  not visualized. No aortic stenosis is present.  7. The inferior vena cava is normal in size with greater than 50%  respiratory variability, suggesting right atrial pressure of 3 mmHg.   Treatments: surgery:  Preoperative Diagnosis:        Enterococcus faecalis bacterial endocarditis  Severe Mitral Regurgitation  Postoperative Diagnosis:    Same  Procedure:        Mitral Valve Repair             Complex valvuloplasty including quadrangular resection of infected anterior leaflet             Medtronic SimuForm Ring Annuloplasty (size 25m, model # [id number] serial # [id number]               Surgeon:        CValentina Gu ORoxy Manns MD  Assistant:       WJohn Giovanni PA-C  Discharge Exam: Blood pressure (!) 144/103, pulse 94, temperature 98.4 F (36.9 C), temperature source Oral, resp. rate 20, height '5\' 7"'  (1.702 m), weight 79.8 kg, SpO2 97 %.  General appearance: alert, cooperative and no distress Heart: regular rate and rhythm Lungs: clear to auscultation bilaterally Abdomen: soft, non-tender; bowel sounds normal; no masses,  no organomegaly Extremities: edema trace Wound: clean and dry   Discharge Medications:  The patient has been discharged on:   1.Beta Blocker:  Yes [Valu.Nieves]                              No   [    ]                              If No, reason:  2.Ace Inhibitor/ARB: Yes [   ]                                     No  [ X   ]                                     If No, reason: post operative glomerulonephritis, elevated creatinine  3.Statin:   Yes [   ]  No  [ X  ]                  If No, reason: no CAD  4.Shela CommonsVelta Addison  [ X  ]                  No   [   ]                  If No, reason:    Discharge Instructions    [facility] pharmacist to adjust dose for Vancomycin, Aminoglycosides and other anti-infective therapies as requested by physician.   Complete by: As directed    [facility] to provide Cath Flo 75m   Complete by: As directed    Administer for PICC line occlusion and as ordered by physician for other access device issues.   Anaphylaxis Kit: Provided to treat any anaphylactic reaction to the medication being provided to the patient if First Dose or when requested by physician   Complete by: As directed    Epinephrine 148mml vial / amp: Administer 0.48m45m0.48ml31mubcutaneously once for moderate to severe anaphylaxis, nurse to call physician and pharmacy when reaction occurs and call 911 if needed for immediate care   Diphenhydramine 50mg44mIV vial: Administer 25-50mg 26mM PRN for first dose reaction, rash, itching, mild reaction, nurse to call physician and pharmacy when reaction occurs   Sodium Chloride 0.9% NS 500ml I71mdminister if needed for hypovolemic blood pressure drop or as ordered by physician after call to physician with anaphylactic reaction   Change dressing on IV access line weekly and PRN   Complete by: As directed    Flush IV access with Sodium Chloride 0.9% and Heparin 10 units/ml or 100 units/ml   Complete by: As directed    Home infusion instructions - [facility]   Complete by: As directed    Instructions: Flush IV access with Sodium Chloride 0.9% and Heparin 10units/ml or 100units/ml   Change dressing on  IV access line: Weekly and PRN   Instructions Cath Flo 2mg: Ad25mister for PICC Line occlusion and as ordered by physician for other access device   [facility] pharmacist to adjust dose for: Vancomycin, Aminoglycosides and other anti-infective therapies as requested by physician   Method of administration may be changed at the discretion of home infusion pharmacist based upon assessment of the patient and/or caregiver's ability to self-administer the medication ordered   Complete by: As directed      Allergies as of 03/26/2021   No Known Allergies     Medication List    STOP taking these medications   carvedilol 6.25 MG tablet Commonly known as: COREG   heparin 25000 UT/250ML infusion   methylPREDNISolone sodium succinate 125 mg/2 mL injection Commonly known as: SOLU-MEDROL   torsemide 100 MG tablet Commonly known as: DEMADEX     TAKE these medications   acetaminophen 325 MG tablet Commonly known as: TYLENOL Take 2 tablets (650 mg total) by mouth every 6 (six) hours as needed for mild pain, moderate pain, fever or headache (or Fever >/= 101).   amLODipine 10 MG tablet Commonly known as: NORVASC Take 1 tablet (10 mg total) by mouth daily.   ampicillin  IVPB Inject 12 g into the vein daily. As a continuous infusion. Indication: Endocarditis First Dose: yes Last Day of Therapy: 05/02/21 Labs - Once weekly:  CBC/D and BMP, Labs - Every other week:  ESR and CRP  Method of administration: Ambulatory Pump (Continuous Infusion) Method of administration may be changed at the discretion of home infusion pharmacist based upon assessment of the patient and/or caregiver's ability to self-administer the medication ordered. What changed:   how much to take  when to take this  additional instructions   aspirin 325 MG EC tablet Take 1 tablet (325 mg total) by mouth daily.   cefTRIAXone  IVPB Commonly known as: ROCEPHIN Inject 2 g into the vein every 12 (twelve) hours.  Indication: Endocarditis First Dose: yes Last Day of Therapy: 05/02/21 Labs - Once weekly:  CBC/D and BMP, Labs - Every other week:  ESR and CRP Method of administration: IV Push Method of administration may be changed at the discretion of home infusion pharmacist based upon assessment of the patient and/or caregiver's ability to self-administer the medication ordered. What changed: additional instructions   metoprolol tartrate 100 MG tablet Commonly known as: LOPRESSOR Take 1 tablet (100 mg total) by mouth 2 (two) times daily.   nicotine 14 mg/24hr patch Commonly known as: NICODERM CQ - dosed in mg/24 hours Place 1 patch (14 mg total) onto the skin daily.   oxyCODONE 5 MG immediate release tablet Commonly known as: Oxy IR/ROXICODONE Take 1 tablet (5 mg total) by mouth every 4 (four) hours as needed for severe pain.   traZODone 50 MG tablet Commonly known as: DESYREL Take 0.5 tablets (25 mg total) by mouth at bedtime as needed for sleep.            Discharge Care Instructions  (From admission, onward)         Start     Ordered   03/26/21 0000  Change dressing on IV access line weekly and PRN  (Home infusion instructions - [facility] )        03/26/21 0719          Follow-up Information    Rexene Alberts, MD. Go on 04/22/2021.   Specialty: Cardiothoracic Surgery Why: Your appointment is at 3:30pm. Please arrive 72mn early for a chest [facility].  Contact information: [address] [place] [phone]       VArvil Chaco PA-C. Go on 04/05/2021.   Specialties: Physician Assistant, Cardiology Why: Your appointment is at 9:00am. Contact information: [phone]       [place] Go on 05/07/2021.   Specialty: Cardiology Why: Your follow up ECHOCARDIOGRAM  is at  11:30am.  Contact information: [address] CLavell Islam MD Follow up on 04/11/2021.   Specialty: Infectious Diseases Why: 9:30 am appoitnment with Dr. VTommy Medalfor VIRTUAL APPOINTMENT - you will receive a phone call before your appointment and a link to a virtual appointment room for the visit.  Contact information: [phone]              Signed: EEllwood Handler PA-C 03/26/2021, 7:20 AM

## 2021-03-26 LAB — AEROBIC/ANAEROBIC CULTURE W GRAM STAIN (SURGICAL/DEEP WOUND): Culture: NO GROWTH

## 2021-03-26 MED ORDER — AMLODIPINE BESYLATE 10 MG PO TABS: 10.0000 mg | ORAL_TABLET | Freq: Every day | ORAL | 3 refills | Status: AC

## 2021-03-26 MED ORDER — CEFTRIAXONE IV (FOR PTA / DISCHARGE USE ONLY): 2.0000 g | Freq: Two times a day (BID) | INTRAVENOUS | 0 refills | Status: AC

## 2021-03-26 MED ORDER — METOPROLOL TARTRATE 100 MG PO TABS: 100.0000 mg | ORAL_TABLET | Freq: Two times a day (BID) | ORAL | 3 refills | Status: AC

## 2021-03-26 MED ORDER — OXYCODONE HCL 5 MG PO TABS: 5.0000 mg | ORAL_TABLET | ORAL | 0 refills | Status: AC | PRN

## 2021-03-26 MED ORDER — AMPICILLIN IV (FOR PTA / DISCHARGE USE ONLY): 12.0000 g | Freq: Every day | INTRAVENOUS | 0 refills | Status: AC

## 2021-03-26 MED FILL — Sodium Bicarbonate IV Soln 8.4%: INTRAVENOUS | Qty: 50 | Status: AC

## 2021-03-26 MED FILL — Sodium Chloride IV Soln 0.9%: INTRAVENOUS | Qty: 3000 | Status: AC

## 2021-03-26 MED FILL — Heparin Sodium (Porcine) Inj 1000 Unit/ML: INTRAMUSCULAR | Qty: 10 | Status: AC

## 2021-03-26 MED FILL — Electrolyte-R (PH 7.4) Solution: INTRAVENOUS | Qty: 3000 | Status: AC

## 2021-03-26 NOTE — TOC Transition Note (Signed)
Transition of Care (TOC) - CM/SW Discharge Note Donn Pierini RN, BSN Transitions of Care Unit 4E- RN Case Manager See Treatment Team for direct phone #   Patient Details  Name: Jerome Mccormick MRN: [id number] Date of Birth: 1991-02-01  Transition of Care [facility]) CM/SW Contact:  Darrold Span, RN Phone Number: 03/26/2021, 4:59 PM   Clinical Narrative:    Pt stable for transition home today, OPAT has been placed for home IV abx needs, Have spoken with PAM from [facility] home infusion, she will come see pt at bedside along with mom to review home education for IV abx needs. Pt will need to get 12noon dose here. [facility] aware and to f/u for resumption of HHRN needs/iv abx picc line care   Final next level of care: Home w Home Health Services Barriers to Discharge: No Barriers Identified   Patient Goals and CMS Choice Patient states their goals for this hospitalization and ongoing recovery are:: return home CMS Medicare.gov Compare Post Acute Care list provided to:: Patient Choice offered to / list presented to : Patient,Parent  Discharge Placement                Home with [facility]      Discharge Plan and Services   Discharge Planning Services: CM Consult Post Acute Care Choice: Home Health,Resumption of Svcs/PTA Provider                    HH Arranged: RN,IV Antibiotics HH Agency: [facility] Date HH Agency Contacted: 03/26/21 Time HH Agency Contacted: 0930 Representative spoke with at [facility] Agency: Pam/Kenzie  Social Determinants of Health (SDOH) Interventions     Readmission Risk Interventions No flowsheet data found.

## 2021-03-26 NOTE — Plan of Care (Signed)
  Problem: Education: Goal: Knowledge of General Education information will improve Description: Including pain rating scale, medication(s)/side effects and non-pharmacologic comfort measures Outcome: Adequate for Discharge   Problem: Health Behavior/Discharge Planning: Goal: Ability to manage health-related needs will improve Outcome: Adequate for Discharge   Problem: Clinical Measurements: Goal: Ability to maintain clinical measurements within normal limits will improve Outcome: Adequate for Discharge Goal: Will remain free from infection Outcome: Adequate for Discharge Goal: Diagnostic test results will improve Outcome: Adequate for Discharge Goal: Respiratory complications will improve Outcome: Adequate for Discharge Goal: Cardiovascular complication will be avoided Outcome: Adequate for Discharge   Problem: Activity: Goal: Risk for activity intolerance will decrease Outcome: Adequate for Discharge   Problem: Nutrition: Goal: Adequate nutrition will be maintained Outcome: Adequate for Discharge   Problem: Coping: Goal: Level of anxiety will decrease Outcome: Adequate for Discharge   Problem: Pain Managment: Goal: General experience of comfort will improve Outcome: Adequate for Discharge   Problem: Safety: Goal: Ability to remain free from injury will improve Outcome: Adequate for Discharge   Problem: Skin Integrity: Goal: Risk for impaired skin integrity will decrease Outcome: Adequate for Discharge   Problem: Education: Goal: Ability to demonstrate management of disease process will improve Outcome: Adequate for Discharge Goal: Ability to verbalize understanding of medication therapies will improve Outcome: Adequate for Discharge Goal: Individualized Educational Video(s) Outcome: Adequate for Discharge   Problem: Cardiac: Goal: Ability to achieve and maintain adequate cardiopulmonary perfusion will improve Outcome: Adequate for Discharge   Problem:  Activity: Goal: Capacity to carry out activities will improve Outcome: Adequate for Discharge   Problem: Education: Goal: Will demonstrate proper wound care and an understanding of methods to prevent future damage Outcome: Adequate for Discharge Goal: Knowledge of disease or condition will improve Outcome: Adequate for Discharge Goal: Knowledge of the prescribed therapeutic regimen will improve Outcome: Adequate for Discharge Goal: Individualized Educational Video(s) Outcome: Adequate for Discharge   Problem: Skin Integrity: Goal: Wound healing without signs and symptoms of infection Outcome: Adequate for Discharge Goal: Risk for impaired skin integrity will decrease Outcome: Adequate for Discharge   Problem: Respiratory: Goal: Respiratory status will improve Outcome: Adequate for Discharge   Problem: Clinical Measurements: Goal: Postoperative complications will be avoided or minimized Outcome: Adequate for Discharge   Problem: Cardiac: Goal: Will achieve and/or maintain hemodynamic stability Outcome: Adequate for Discharge   Problem: Urinary Elimination: Goal: Ability to achieve and maintain adequate renal perfusion and functioning will improve Outcome: Adequate for Discharge

## 2021-03-26 NOTE — Progress Notes (Addendum)
      [address]       [place] [address]             [phone]      5 Days Post-Op Procedure(s) (LRB): MITRAL VALVE REPAIR (MVR) USING MEDTRONIC SIMUFORM SEMI-RIGID ANNULOPLASTY RING (N/A) TRANSESOPHAGEAL ECHOCARDIOGRAM (TEE) (N/A)   Subjective:  Just waking up. No complaints.  + ambulation  + BM  Objective: Vital signs in last 24 hours: Temp:  [98.1 F (36.7 C)-98.5 F (36.9 C)] 98.4 F (36.9 C) (04/26 0300) Pulse Rate:  [89-100] 94 (04/26 0300) Cardiac Rhythm: Sinus tachycardia (04/25 1900) Resp:  [15-20] 20 (04/26 0300) BP: (122-144)/(79-105) 144/103 (04/26 0300) SpO2:  [97 %-100 %] 97 % (04/26 0300) Weight:  [79.8 kg] 79.8 kg (04/26 0624)   Intake/Output from previous day: 04/25 0701 - 04/26 0700 In: 1000.2 [IV Piggyback:1000.2] Out: 1650 [Urine:1650]  General appearance: alert, cooperative and no distress Heart: regular rate and rhythm Lungs: clear to auscultation bilaterally Abdomen: soft, non-tender; bowel sounds normal; no masses,  no organomegaly Extremities: edema trace Wound: clean and dry  Lab Results: Recent Labs    03/25/21 0411  WBC 8.5  HGB 8.6*  HCT 27.2*  PLT 349   BMET:  Recent Labs    03/24/21 0402 03/25/21 0411  NA 135 135  K 4.3 4.1  CL 102 102  CO2 27 27  GLUCOSE 87 93  BUN 24* 21*  CREATININE 1.49* 1.47*  CALCIUM 8.5* 8.4*    PT/INR: No results for input(s): LABPROT, INR in the last 72 hours. ABG    Component Value Date/Time   PHART 7.406 03/21/2021 1757   HCO3 28.9 (H) 03/21/2021 1757   TCO2 30 03/21/2021 1757   O2SAT 99.0 03/21/2021 1757   CBG (last 3)  Recent Labs    03/23/21 0735  GLUCAP 102*    Assessment/Plan: S/P Procedure(s) (LRB): MITRAL VALVE REPAIR (MVR) USING MEDTRONIC SIMUFORM SEMI-RIGID ANNULOPLASTY RING (N/A) TRANSESOPHAGEAL ECHOCARDIOGRAM (TEE) (N/A)  1. CV- NSR, BP improved- continue Lopressor, Norvasc 2. Pulm- no acute issues, off oxygen, continue IS 3.  Renal- post operative glomerulonephritis- improving 4. ID- Endocarditis- ID following, appreciate assistance 5. Dispo- patient stable, will d/c home today   LOS: 7 days    Lowella Dandy, PA-C 03/26/2021   I have seen and examined the patient and agree with the assessment and plan as outlined.  Purcell Nails, MD 03/26/2021 10:04 AM

## 2021-03-26 NOTE — Progress Notes (Signed)
Discontinued CT sutures w/o difficulty.  R & L sites minimally open & dry.  Middle site with approx 1 cm opening x1 cm deep and dry.  PA notified.   Discharge instructions reviewed.   Medication list reviewed & pharmacy confirmed with Mother at bedside. Assisted to private vehicle via w/c without difficulty.

## 2021-03-26 NOTE — Progress Notes (Signed)
Discussed IS, sternal precautions, smoking cessation, walking guidelines, and CRPII. Pt receptive. Gave heart healthy diet however encouraged pt to discuss his need for renal diet going forward with his MD. Will refer to [facility] CRPII. Pt sts that he would do nicotine patches going home but does not seem convinced he will quit smoking. He is in pain right now and somewhat distracted.  [phone] Ethelda Chick CES, ACSM 8:44 AM 03/26/2021

## 2021-03-27 ENCOUNTER — Emergency Department: Payer: Medicaid Other

## 2021-03-27 ENCOUNTER — Emergency Department: Payer: Medicaid Other | Admitting: Certified Registered Nurse Anesthetist

## 2021-03-27 ENCOUNTER — Inpatient Hospital Stay (HOSPITAL_COMMUNITY)
Admission: EM | Admit: 2021-03-27 | Payer: Self-pay | Source: Other Acute Inpatient Hospital | Admitting: Pulmonary Disease

## 2021-03-27 ENCOUNTER — Encounter: Payer: Self-pay | Admitting: Anesthesiology

## 2021-03-27 ENCOUNTER — Encounter: Admission: EM | Disposition: E | Payer: Self-pay | Source: Home / Self Care | Attending: Pulmonary Disease

## 2021-03-27 ENCOUNTER — Other Ambulatory Visit: Payer: Self-pay

## 2021-03-27 ENCOUNTER — Inpatient Hospital Stay: Payer: Medicaid Other

## 2021-03-27 ENCOUNTER — Telehealth: Payer: Self-pay | Admitting: *Deleted

## 2021-03-27 ENCOUNTER — Inpatient Hospital Stay
Admission: EM | Admit: 2021-03-27 | Discharge: 2021-03-31 | DRG: 023 | Disposition: E | Payer: Medicaid Other | Attending: Pulmonary Disease | Admitting: Pulmonary Disease

## 2021-03-27 DIAGNOSIS — Z20822 Contact with and (suspected) exposure to covid-19: Secondary | ICD-10-CM | POA: Diagnosis present

## 2021-03-27 DIAGNOSIS — R4020 Unspecified coma: Secondary | ICD-10-CM | POA: Diagnosis present

## 2021-03-27 DIAGNOSIS — D62 Acute posthemorrhagic anemia: Secondary | ICD-10-CM | POA: Diagnosis not present

## 2021-03-27 DIAGNOSIS — G253 Myoclonus: Secondary | ICD-10-CM | POA: Diagnosis not present

## 2021-03-27 DIAGNOSIS — F17210 Nicotine dependence, cigarettes, uncomplicated: Secondary | ICD-10-CM | POA: Diagnosis present

## 2021-03-27 DIAGNOSIS — G935 Compression of brain: Secondary | ICD-10-CM | POA: Diagnosis present

## 2021-03-27 DIAGNOSIS — Z79899 Other long term (current) drug therapy: Secondary | ICD-10-CM | POA: Diagnosis not present

## 2021-03-27 DIAGNOSIS — Z0189 Encounter for other specified special examinations: Secondary | ICD-10-CM

## 2021-03-27 DIAGNOSIS — I6201 Nontraumatic acute subdural hemorrhage: Secondary | ICD-10-CM | POA: Diagnosis present

## 2021-03-27 DIAGNOSIS — Z9889 Other specified postprocedural states: Secondary | ICD-10-CM

## 2021-03-27 DIAGNOSIS — N058 Unspecified nephritic syndrome with other morphologic changes: Secondary | ICD-10-CM | POA: Diagnosis present

## 2021-03-27 DIAGNOSIS — G9349 Other encephalopathy: Secondary | ICD-10-CM | POA: Diagnosis present

## 2021-03-27 DIAGNOSIS — Z7982 Long term (current) use of aspirin: Secondary | ICD-10-CM | POA: Diagnosis not present

## 2021-03-27 DIAGNOSIS — N179 Acute kidney failure, unspecified: Secondary | ICD-10-CM | POA: Diagnosis present

## 2021-03-27 DIAGNOSIS — Z952 Presence of prosthetic heart valve: Secondary | ICD-10-CM

## 2021-03-27 DIAGNOSIS — J9600 Acute respiratory failure, unspecified whether with hypoxia or hypercapnia: Secondary | ICD-10-CM | POA: Diagnosis present

## 2021-03-27 DIAGNOSIS — I619 Nontraumatic intracerebral hemorrhage, unspecified: Secondary | ICD-10-CM | POA: Diagnosis present

## 2021-03-27 DIAGNOSIS — R569 Unspecified convulsions: Secondary | ICD-10-CM | POA: Diagnosis not present

## 2021-03-27 DIAGNOSIS — Z6825 Body mass index (BMI) 25.0-25.9, adult: Secondary | ICD-10-CM | POA: Diagnosis not present

## 2021-03-27 DIAGNOSIS — I629 Nontraumatic intracranial hemorrhage, unspecified: Secondary | ICD-10-CM | POA: Diagnosis present

## 2021-03-27 DIAGNOSIS — G9340 Encephalopathy, unspecified: Secondary | ICD-10-CM

## 2021-03-27 DIAGNOSIS — Z66 Do not resuscitate: Secondary | ICD-10-CM | POA: Diagnosis present

## 2021-03-27 DIAGNOSIS — I38 Endocarditis, valve unspecified: Secondary | ICD-10-CM | POA: Diagnosis present

## 2021-03-27 DIAGNOSIS — E440 Moderate protein-calorie malnutrition: Secondary | ICD-10-CM | POA: Diagnosis present

## 2021-03-27 DIAGNOSIS — Z515 Encounter for palliative care: Secondary | ICD-10-CM | POA: Diagnosis not present

## 2021-03-27 HISTORY — PX: CRANIOTOMY: SHX93

## 2021-03-27 LAB — GLUCOSE, CAPILLARY
Glucose-Capillary: 140 mg/dL — ABNORMAL HIGH (ref 70–99)
Glucose-Capillary: 113 mg/dL — ABNORMAL HIGH (ref 70–99)
Glucose-Capillary: 100 mg/dL — ABNORMAL HIGH (ref 70–99)

## 2021-03-27 LAB — COMPREHENSIVE METABOLIC PANEL
ALT: 89 U/L — ABNORMAL HIGH (ref 0–44)
ALT: 74 U/L — ABNORMAL HIGH (ref 0–44)
AST: 45 U/L — ABNORMAL HIGH (ref 15–41)
AST: 36 U/L (ref 15–41)
Albumin: 3.4 g/dL — ABNORMAL LOW (ref 3.5–5.0)
Albumin: 3.2 g/dL — ABNORMAL LOW (ref 3.5–5.0)
Alkaline Phosphatase: 78 U/L (ref 38–126)
Alkaline Phosphatase: 64 U/L (ref 38–126)
Anion gap: 17 — ABNORMAL HIGH (ref 5–15)
Anion gap: 11 (ref 5–15)
BUN: 23 mg/dL — ABNORMAL HIGH (ref 6–20)
BUN: 23 mg/dL — ABNORMAL HIGH (ref 6–20)
CO2: 26 mmol/L (ref 22–32)
CO2: 30 mmol/L (ref 22–32)
Calcium: 8.8 mg/dL — ABNORMAL LOW (ref 8.9–10.3)
Calcium: 8 mg/dL — ABNORMAL LOW (ref 8.9–10.3)
Chloride: 95 mmol/L — ABNORMAL LOW (ref 98–111)
Chloride: 101 mmol/L (ref 98–111)
Creatinine, Ser: 1.39 mg/dL — ABNORMAL HIGH (ref 0.61–1.24)
Creatinine, Ser: 1.23 mg/dL (ref 0.61–1.24)
GFR, Estimated: >60 mL/min (ref >60)
GFR, Estimated: >60 mL/min (ref >60)
Glucose, Bld: 219 mg/dL — ABNORMAL HIGH (ref 70–99)
Glucose, Bld: 147 mg/dL — ABNORMAL HIGH (ref 70–99)
Potassium: 3.2 mmol/L — ABNORMAL LOW (ref 3.5–5.1)
Potassium: 3.1 mmol/L — ABNORMAL LOW (ref 3.5–5.1)
Sodium: 138 mmol/L (ref 135–145)
Sodium: 142 mmol/L (ref 135–145)
Total Bilirubin: 0.6 mg/dL (ref 0.3–1.2)
Total Bilirubin: 0.6 mg/dL (ref 0.3–1.2)
Total Protein: 7.4 g/dL (ref 6.5–8.1)
Total Protein: 6.6 g/dL (ref 6.5–8.1)

## 2021-03-27 LAB — URINALYSIS, ROUTINE W REFLEX MICROSCOPIC
Bacteria, UA: NONE SEEN
Bilirubin Urine: NEGATIVE
Glucose, UA: NEGATIVE mg/dL
Ketones, ur: NEGATIVE mg/dL
Leukocytes,Ua: NEGATIVE
Nitrite: NEGATIVE
Protein, ur: NEGATIVE mg/dL
Specific Gravity, Urine: 1.005 (ref 1.005–1.030)
Squamous Epithelial / HPF: NONE SEEN (ref 0–5)
pH: 7 (ref 5.0–8.0)

## 2021-03-27 LAB — BLOOD GAS, ARTERIAL
Acid-base deficit: 5.9 mmol/L — ABNORMAL HIGH (ref 0.0–2.0)
Bicarbonate: 17.9 mmol/L — ABNORMAL LOW (ref 20.0–28.0)
FIO2: 0.4
MECHVT: 500 mL
O2 Saturation: 98.1 %
PEEP: 8 cmH2O
Patient temperature: 37
RATE: 16 resp/min
pCO2 arterial: 27 mmHg — ABNORMAL LOW (ref 32.0–48.0)
pH, Arterial: 7.43 (ref 7.350–7.450)
pO2, Arterial: 103 mmHg (ref 83.0–108.0)

## 2021-03-27 LAB — CBC WITH DIFFERENTIAL/PLATELET
Abs Immature Granulocytes: 0.21 10*3/uL — ABNORMAL HIGH (ref 0.00–0.07)
Basophils Absolute: 0.1 10*3/uL (ref 0.0–0.1)
Basophils Relative: 1 %
Eosinophils Absolute: 0.4 10*3/uL (ref 0.0–0.5)
Eosinophils Relative: 3 %
HCT: 33 % — ABNORMAL LOW (ref 39.0–52.0)
Hemoglobin: 10.7 g/dL — ABNORMAL LOW (ref 13.0–17.0)
Immature Granulocytes: 1 %
Lymphocytes Relative: 24 %
Lymphs Abs: 4 10*3/uL (ref 0.7–4.0)
MCH: 29.2 pg (ref 26.0–34.0)
MCHC: 32.4 g/dL (ref 30.0–36.0)
MCV: 89.9 fL (ref 80.0–100.0)
Monocytes Absolute: 1.2 10*3/uL — ABNORMAL HIGH (ref 0.1–1.0)
Monocytes Relative: 7 %
Neutro Abs: 11 10*3/uL — ABNORMAL HIGH (ref 1.7–7.7)
Neutrophils Relative %: 64 %
Platelets: 574 10*3/uL — ABNORMAL HIGH (ref 150–400)
RBC: 3.67 MIL/uL — ABNORMAL LOW (ref 4.22–5.81)
RDW: 16.2 % — ABNORMAL HIGH (ref 11.5–15.5)
WBC: 16.9 10*3/uL — ABNORMAL HIGH (ref 4.0–10.5)
nRBC: 0 % (ref 0.0–0.2)

## 2021-03-27 LAB — OSMOLALITY, URINE: Osmolality, Ur: 309 mOsm/kg (ref 300–900)

## 2021-03-27 LAB — URINALYSIS, COMPLETE (UACMP) WITH MICROSCOPIC
Bacteria, UA: NONE SEEN
Bilirubin Urine: NEGATIVE
Glucose, UA: 50 mg/dL — AB
Ketones, ur: NEGATIVE mg/dL
Leukocytes,Ua: NEGATIVE
Nitrite: NEGATIVE
Protein, ur: 100 mg/dL — AB
Specific Gravity, Urine: 1.006 (ref 1.005–1.030)
pH: 6 (ref 5.0–8.0)

## 2021-03-27 LAB — RESP PANEL BY RT-PCR (FLU A&B, COVID) ARPGX2
Influenza A by PCR: NEGATIVE
Influenza B by PCR: NEGATIVE
SARS Coronavirus 2 by RT PCR: NEGATIVE

## 2021-03-27 LAB — URINE DRUG SCREEN, QUALITATIVE (ARMC ONLY)
Amphetamines, Ur Screen: NOT DETECTED
Barbiturates, Ur Screen: NOT DETECTED
Benzodiazepine, Ur Scrn: NOT DETECTED
Cannabinoid 50 Ng, Ur ~~LOC~~: NOT DETECTED
Cocaine Metabolite,Ur ~~LOC~~: NOT DETECTED
MDMA (Ecstasy)Ur Screen: NOT DETECTED
Methadone Scn, Ur: NOT DETECTED
Opiate, Ur Screen: NOT DETECTED
Phencyclidine (PCP) Ur S: NOT DETECTED
Tricyclic, Ur Screen: NOT DETECTED

## 2021-03-27 LAB — MAGNESIUM
Magnesium: 1.6 mg/dL — ABNORMAL LOW (ref 1.7–2.4)
Magnesium: 1.3 mg/dL — ABNORMAL LOW (ref 1.7–2.4)

## 2021-03-27 LAB — CBC
HCT: 28 % — ABNORMAL LOW (ref 39.0–52.0)
Hemoglobin: 9.1 g/dL — ABNORMAL LOW (ref 13.0–17.0)
MCH: 29.1 pg (ref 26.0–34.0)
MCHC: 32.5 g/dL (ref 30.0–36.0)
MCV: 89.5 fL (ref 80.0–100.0)
Platelets: 366 10*3/uL (ref 150–400)
RBC: 3.13 MIL/uL — ABNORMAL LOW (ref 4.22–5.81)
RDW: 16 % — ABNORMAL HIGH (ref 11.5–15.5)
WBC: 11.6 10*3/uL — ABNORMAL HIGH (ref 4.0–10.5)
nRBC: 0 % (ref 0.0–0.2)

## 2021-03-27 LAB — PHOSPHORUS: Phosphorus: 5.2 mg/dL — ABNORMAL HIGH (ref 2.5–4.6)

## 2021-03-27 LAB — TROPONIN I (HIGH SENSITIVITY)
Troponin I (High Sensitivity): 356 ng/L (ref <18)
Troponin I (High Sensitivity): 313 ng/L (ref <18)

## 2021-03-27 LAB — SODIUM
Sodium: 140 mmol/L (ref 135–145)
Sodium: 143 mmol/L (ref 135–145)

## 2021-03-27 LAB — PROTIME-INR
INR: 1.4 — ABNORMAL HIGH (ref 0.8–1.2)
Prothrombin Time: 16.9 seconds — ABNORMAL HIGH (ref 11.4–15.2)

## 2021-03-27 LAB — POTASSIUM: Potassium: 3.4 mmol/L — ABNORMAL LOW (ref 3.5–5.1)

## 2021-03-27 LAB — SODIUM, URINE, RANDOM: Sodium, Ur: 132 mmol/L

## 2021-03-27 LAB — LACTIC ACID, PLASMA
Lactic Acid, Venous: 1.3 mmol/L (ref 0.5–1.9)
Lactic Acid, Venous: 1.1 mmol/L (ref 0.5–1.9)

## 2021-03-27 LAB — OSMOLALITY: Osmolality: 297 mOsm/kg — ABNORMAL HIGH (ref 275–295)

## 2021-03-27 IMAGING — CT CT HEAD W/O CM
3 series · 15 of 47 positions shown, 18 images · non-contrast
Comparison: MRI [DATE]

CLINICAL DATA: Found unconscious in the bathroom. Abnormal posture.
Endocarditis and headache.

EXAM:
CT HEAD WITHOUT CONTRAST
CT CERVICAL SPINE WITHOUT CONTRAST
TECHNIQUE: Multidetector CT imaging of the head and cervical spine was
performed following the standard protocol without intravenous
contrast. Multiplanar CT image reconstructions of the cervical spine
were also generated.

[Series 2: head wo · axial · 0.41mm/px · z∈[+342,+467]mm · 9 of 30 slices shown, 12 images]
[im 3/30  brain]
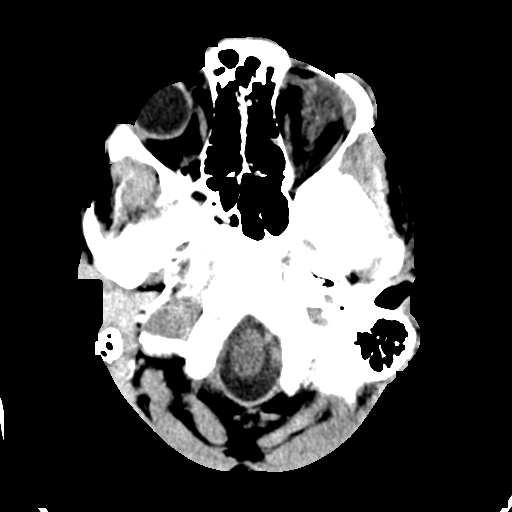
[im 3/30  bone]
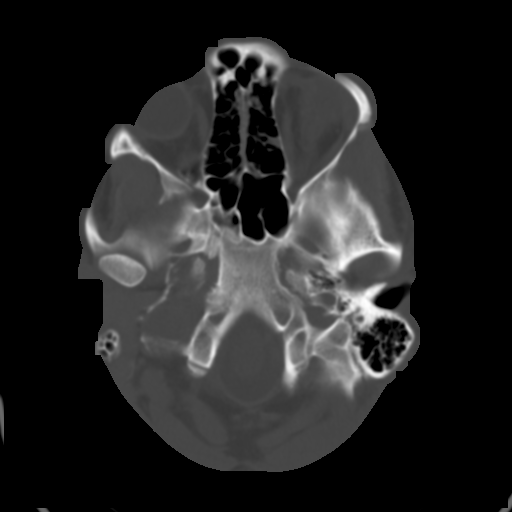
[im 6/30  brain]
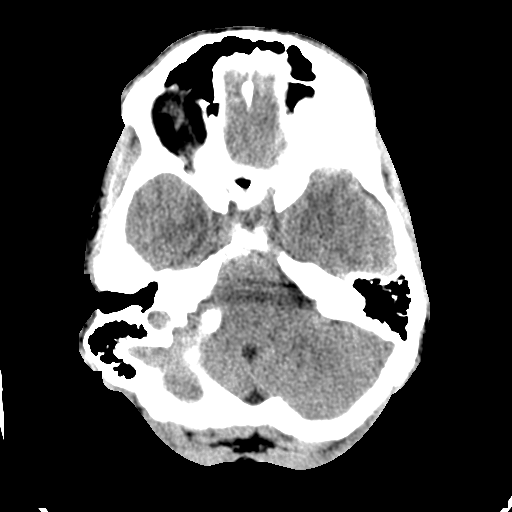
[im 9/30  brain]
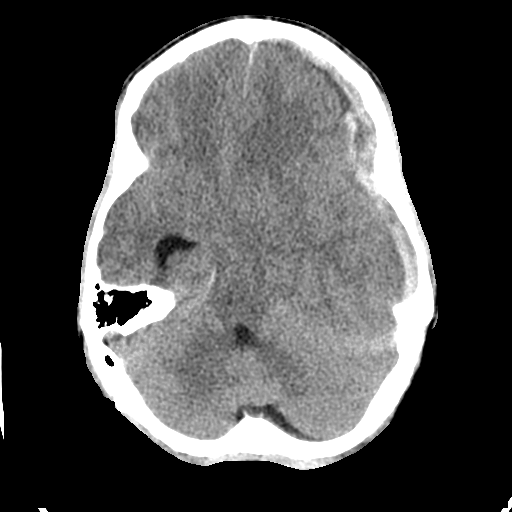
[im 12/30  brain]
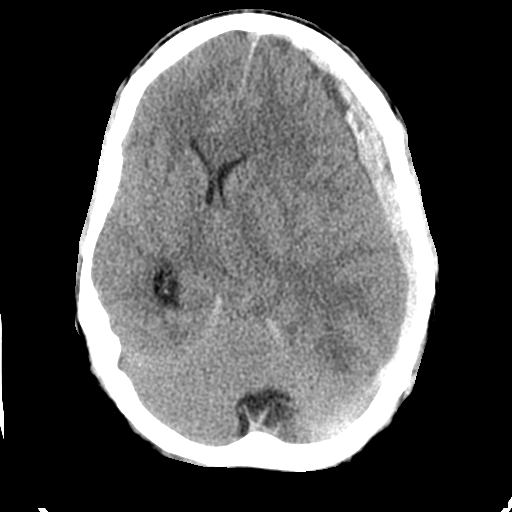
[im 16/30  brain]
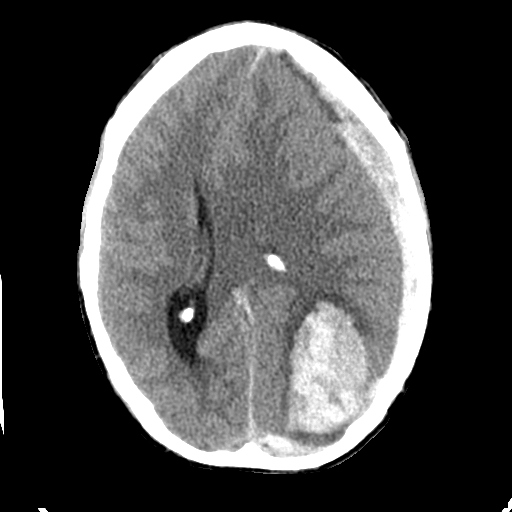
[im 16/30  bone]
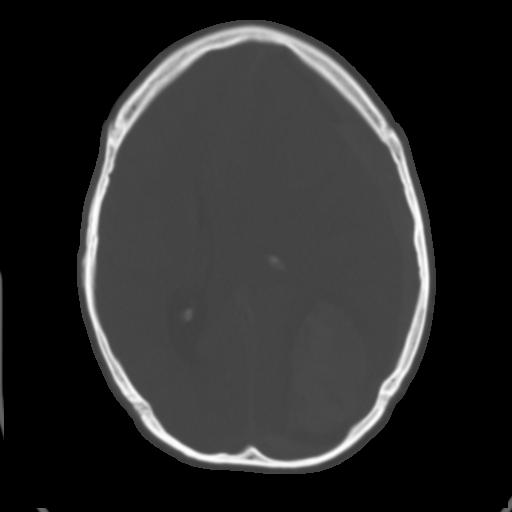
[im 19/30  brain]
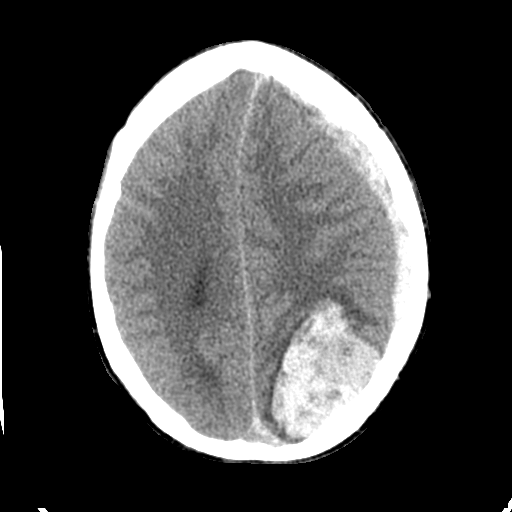
[im 22/30  brain]
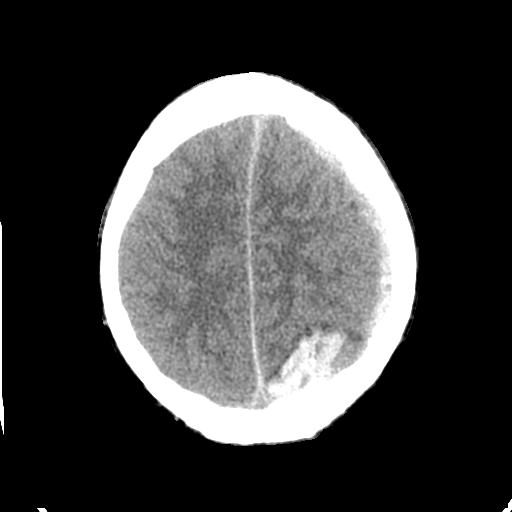
[im 25/30  brain]
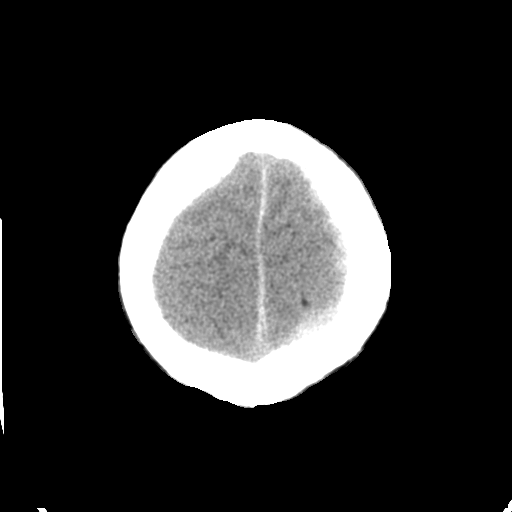
[im 28/30  brain]
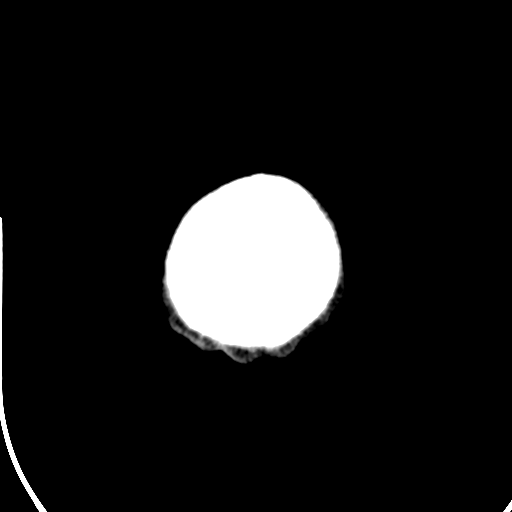
[im 28/30  bone]
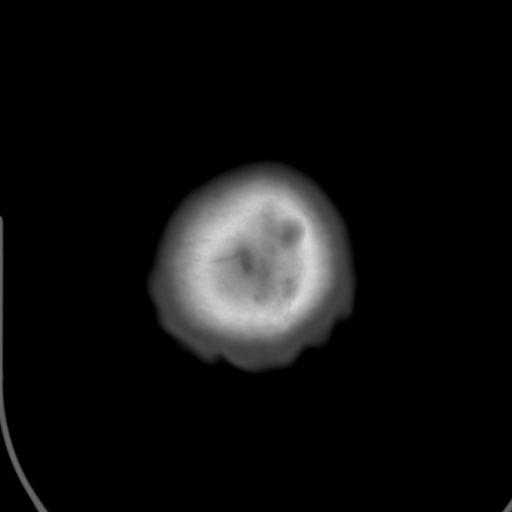

[Series 4: coronal soft tissue · coronal · 0.32mm/px · 3 of 67 slices shown]
[im 23/67  brain]
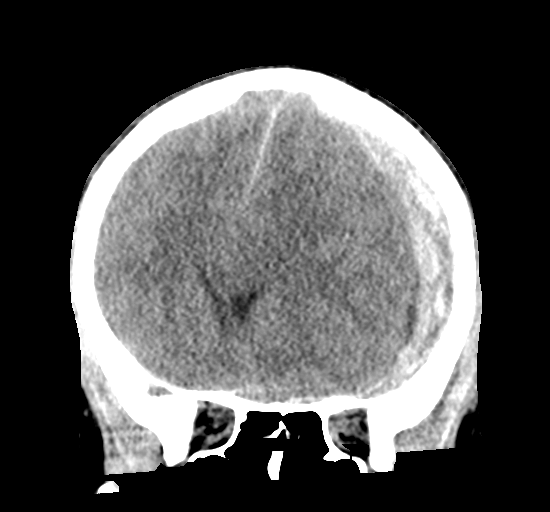
[im 30/67  brain]
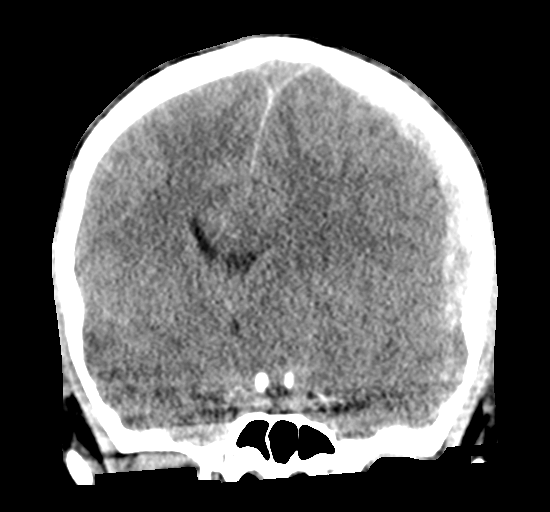
[im 37/67  brain]
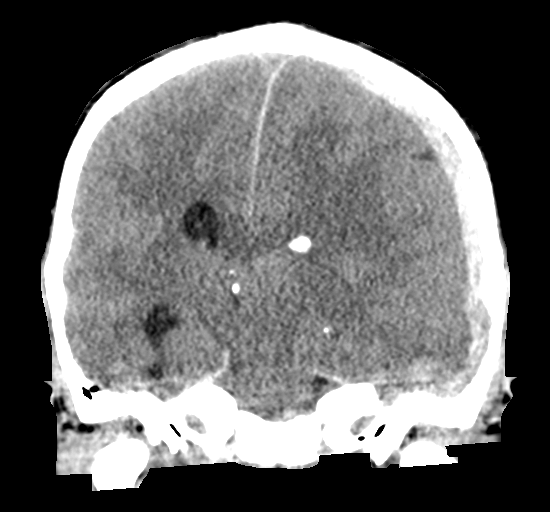

[Series 5: sagittal soft tissue · sagittal · 0.32mm/px · 3 of 57 slices shown]
[im 19/57  brain]
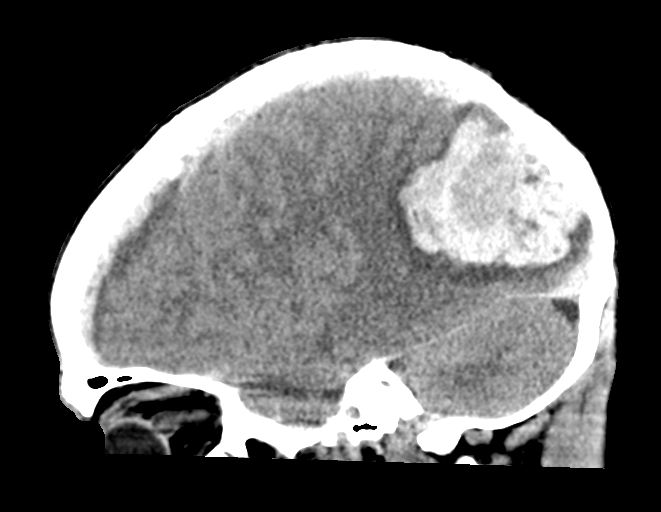
[im 29/57  brain]
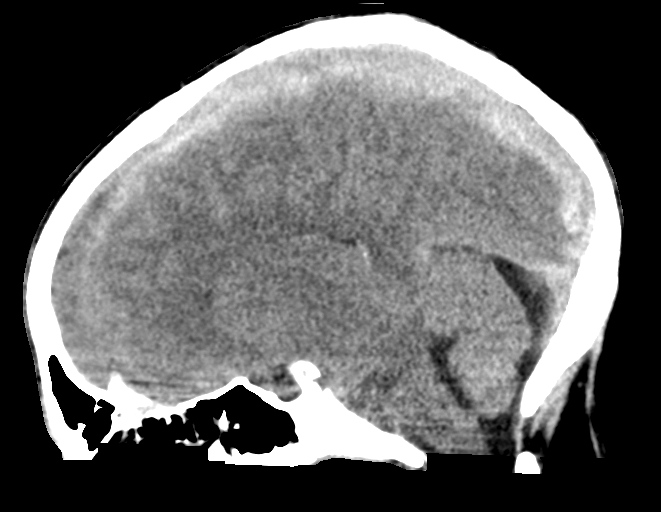
[im 38/57  brain]
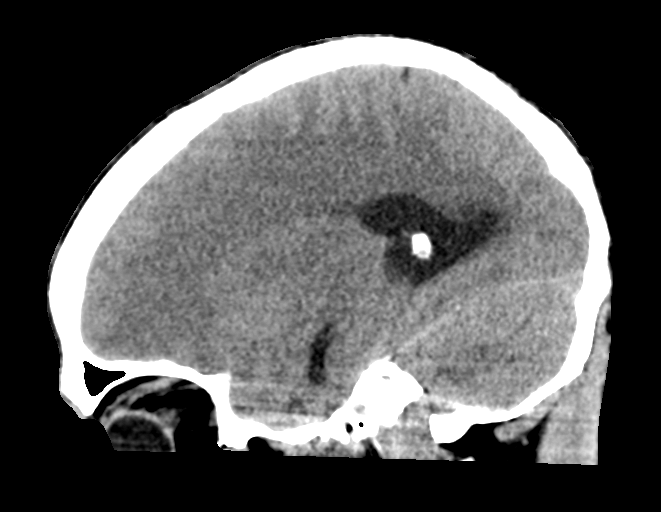

[15 of 47 positions shown; findings below may reference images not displayed]

FINDINGS: CT HEAD FINDINGS

Brain: Development of a large intraparenchymal hematoma in the left
parieto-occipital junction region measuring 6.1 x 3.9 x 5.2 cm
(volume = 65 cm^3). Small amount of surrounding edema. Patient
also has an acute subdural hematoma along the lateral convexity on
the left, maximal thickness 1 cm. There is mass effect with
left-to-right shift 1.6 cm. Question early trapping of the right
lateral ventricle.

Vascular: No primary vascular finding.

Skull: Normal

Sinuses/Orbits: Clear/normal

Other: None

CT CERVICAL SPINE FINDINGS

Alignment: No malalignment.  Head bent forward.

Skull base and vertebrae: Normal

Soft tissues and spinal canal: Normal.  Endotracheal intubation.

Disc levels:  Normal

Upper chest: Not included.

Other: None
IMPRESSION: HEAD CT:

Development of a large intraparenchymal hematoma in the left
parieto-occipital junction region measuring 6.1 x 3.9 x 5.2 cm
(volume 65 cm^3). Acute subdural hematoma along the lateral
convexity on the left, maximal thickness 1 cm. Mass effect with
left-to-right shift of 1.6 cm. At MRI 1 week ago, there was low
level hemorrhage in the location now occupied by the large hematoma.
Findings at that time were hypothesized to relate to posterior
reversible encephalopathy, but certainly septic emboli were and are
not excluded.

Critical Value/emergent results were called by telephone at the time
of interpretation on [DATE] at [DATE] to provider KUMER
KUMER , who verbally acknowledged these results.

CERVICAL SPINE CT:

No acute or traumatic finding. Head bent forward.

## 2021-03-27 SURGERY — CRANIOTOMY HEMATOMA EVACUATION SUBDURAL
Anesthesia: General | Laterality: Left

## 2021-03-27 MED ORDER — ETOMIDATE 2 MG/ML IV SOLN
30.0000 mg | Freq: Once | INTRAVENOUS | Status: AC
  Administered 2021-03-27: 30 mg via INTRAVENOUS

## 2021-03-27 MED ORDER — CHLORHEXIDINE GLUCONATE 0.12% ORAL RINSE (MEDLINE KIT)
15.0000 mL | Freq: Two times a day (BID) | OROMUCOSAL | Status: DC
  Administered 2021-03-27 – 2021-03-29 (×4): 15 mL via OROMUCOSAL

## 2021-03-27 MED ORDER — EPHEDRINE SULFATE 50 MG/ML IJ SOLN
INTRAMUSCULAR | Status: DC | PRN
  Administered 2021-03-27: 10 mg via INTRAVENOUS

## 2021-03-27 MED ORDER — SODIUM CHLORIDE 0.9 % IV SOLN
INTRAVENOUS | Status: DC
  Filled 2021-03-27 (×4): qty 500

## 2021-03-27 MED ORDER — POTASSIUM CHLORIDE 10 MEQ/100ML IV SOLN
10.0000 meq | INTRAVENOUS | Status: AC
  Administered 2021-03-27 – 2021-03-28 (×5): 10 meq via INTRAVENOUS
  Filled 2021-03-27 (×5): qty 100

## 2021-03-27 MED ORDER — SODIUM CHLORIDE 0.9 % IV SOLN: INTRAVENOUS | Status: DC | PRN

## 2021-03-27 MED ORDER — PROPOFOL 10 MG/ML IV BOLUS
INTRAVENOUS | Status: DC | PRN
  Administered 2021-03-27: 50 mg via INTRAVENOUS

## 2021-03-27 MED ORDER — CEFAZOLIN SODIUM-DEXTROSE 2-3 GM-%(50ML) IV SOLR
INTRAVENOUS | Status: DC | PRN
  Administered 2021-03-27: 2 g via INTRAVENOUS

## 2021-03-27 MED ORDER — LEVETIRACETAM 500 MG/5ML IV SOLN
INTRAVENOUS | Status: DC | PRN
  Administered 2021-03-27: 500 mg via INTRAVENOUS

## 2021-03-27 MED ORDER — GELATIN ABSORBABLE 100 CM EX MISC
CUTANEOUS | Status: AC
  Filled 2021-03-27: qty 1

## 2021-03-27 MED ORDER — SODIUM CHLORIDE 0.9 % IV SOLN
2.0000 g | Freq: Two times a day (BID) | INTRAVENOUS | Status: DC
  Administered 2021-03-27 – 2021-03-29 (×4): 2 g via INTRAVENOUS
  Filled 2021-03-27: qty 2
  Filled 2021-03-27: qty 20
  Filled 2021-03-27 (×3): qty 2

## 2021-03-27 MED ORDER — LIDOCAINE-EPINEPHRINE 1 %-1:100000 IJ SOLN
INTRAMUSCULAR | Status: DC | PRN
  Administered 2021-03-27: 9 mL

## 2021-03-27 MED ORDER — ORAL CARE MOUTH RINSE
15.0000 mL | OROMUCOSAL | Status: DC
  Administered 2021-03-27 – 2021-03-29 (×20): 15 mL via OROMUCOSAL

## 2021-03-27 MED ORDER — VASOPRESSIN 20 UNIT/ML IV SOLN
INTRAVENOUS | Status: DC | PRN
  Administered 2021-03-27 (×5): 2 [IU] via INTRAVENOUS

## 2021-03-27 MED ORDER — CEFTRIAXONE IV (FOR PTA / DISCHARGE USE ONLY): 2.0000 g | Freq: Two times a day (BID) | INTRAVENOUS | Status: DC

## 2021-03-27 MED ORDER — PHENYLEPHRINE HCL (PRESSORS) 10 MG/ML IV SOLN
INTRAVENOUS | Status: DC | PRN
  Administered 2021-03-27 (×5): 100 ug via INTRAVENOUS

## 2021-03-27 MED ORDER — LEVETIRACETAM IN NACL 1000 MG/100ML IV SOLN: 1000.0000 mg | Freq: Once | INTRAVENOUS | Status: DC

## 2021-03-27 MED ORDER — FAMOTIDINE IN NACL 20-0.9 MG/50ML-% IV SOLN
20.0000 mg | Freq: Two times a day (BID) | INTRAVENOUS | Status: DC
  Administered 2021-03-27 – 2021-03-28 (×2): 20 mg via INTRAVENOUS
  Filled 2021-03-27 (×2): qty 50

## 2021-03-27 MED ORDER — NICARDIPINE HCL IN NACL 20-0.86 MG/200ML-% IV SOLN
3.0000 mg/h | INTRAVENOUS | Status: DC
  Administered 2021-03-27: 4 mg/h via INTRAVENOUS
  Administered 2021-03-27: 5 mg/h via INTRAVENOUS
  Administered 2021-03-27: 8 mg/h via INTRAVENOUS
  Administered 2021-03-27: 2 mg/h via INTRAVENOUS
  Administered 2021-03-28 (×2): 5 mg/h via INTRAVENOUS
  Administered 2021-03-28: 7 mg/h via INTRAVENOUS
  Administered 2021-03-28: 7.5 mg/h via INTRAVENOUS
  Administered 2021-03-29: 3 mg/h via INTRAVENOUS
  Filled 2021-03-27 (×8): qty 200

## 2021-03-27 MED ORDER — SODIUM CHLORIDE 3 % IV SOLN
INTRAVENOUS | Status: DC
  Filled 2021-03-27 (×10): qty 500

## 2021-03-27 MED ORDER — BACITRACIN 500 UNIT/GM EX OINT
TOPICAL_OINTMENT | CUTANEOUS | Status: DC | PRN
  Administered 2021-03-27: 1 via TOPICAL

## 2021-03-27 MED ORDER — THROMBIN (RECOMBINANT) 5000 UNITS EX SOLR
CUTANEOUS | Status: AC
  Filled 2021-03-27: qty 5000

## 2021-03-27 MED ORDER — POLYETHYLENE GLYCOL 3350 17 G PO PACK: 17.0000 g | PACK | Freq: Every day | ORAL | Status: DC | PRN

## 2021-03-27 MED ORDER — ROCURONIUM BROMIDE 100 MG/10ML IV SOLN
INTRAVENOUS | Status: DC | PRN
  Administered 2021-03-27: 100 mg via INTRAVENOUS

## 2021-03-27 MED ORDER — MAGNESIUM SULFATE 2 GM/50ML IV SOLN
2.0000 g | Freq: Once | INTRAVENOUS | Status: AC
  Administered 2021-03-27: 2 g via INTRAVENOUS
  Filled 2021-03-27: qty 50

## 2021-03-27 MED ORDER — ROCURONIUM BROMIDE 50 MG/5ML IV SOLN
100.0000 mg | Freq: Once | INTRAVENOUS | Status: AC
  Administered 2021-03-27: 100 mg via INTRAVENOUS

## 2021-03-27 MED ORDER — MANNITOL 25 % IV SOLN
INTRAVENOUS | Status: AC
  Filled 2021-03-27: qty 100

## 2021-03-27 MED ORDER — PROPOFOL 1000 MG/100ML IV EMUL
5.0000 ug/kg/min | INTRAVENOUS | Status: DC
  Administered 2021-03-27: 50 ug/kg/min via INTRAVENOUS
  Administered 2021-03-27: 5 ug/kg/min via INTRAVENOUS
  Administered 2021-03-27: 55 ug/kg/min via INTRAVENOUS
  Administered 2021-03-28: 35 ug/kg/min via INTRAVENOUS
  Administered 2021-03-28 (×2): 60 ug/kg/min via INTRAVENOUS
  Administered 2021-03-28: 45 ug/kg/min via INTRAVENOUS
  Administered 2021-03-28: 55 ug/kg/min via INTRAVENOUS
  Administered 2021-03-29: 45 ug/kg/min via INTRAVENOUS
  Administered 2021-03-29: 16.8 ug/kg/min via INTRAVENOUS
  Administered 2021-03-29: 45 ug/kg/min via INTRAVENOUS
  Filled 2021-03-27 (×11): qty 100

## 2021-03-27 MED ORDER — BACITRACIN ZINC 500 UNIT/GM EX OINT
TOPICAL_OINTMENT | CUTANEOUS | Status: AC
  Filled 2021-03-27: qty 28.35

## 2021-03-27 MED ORDER — SODIUM CHLORIDE 3 % IV BOLUS
250.0000 mL | Freq: Once | INTRAVENOUS | Status: AC
  Administered 2021-03-27: 250 mL via INTRAVENOUS

## 2021-03-27 MED ORDER — LEVETIRACETAM 500 MG/5ML IV SOLN
INTRAVENOUS | Status: AC
  Filled 2021-03-27: qty 5

## 2021-03-27 MED ORDER — SODIUM CHLORIDE 23.4 % INJECTION (4 MEQ/ML) FOR IV ADMINISTRATION
120.0000 meq | Freq: Once | INTRAVENOUS | Status: AC
  Administered 2021-03-27: 120 meq via INTRAVENOUS
  Filled 2021-03-27: qty 30

## 2021-03-27 MED ORDER — ALBUMIN HUMAN 5 % IV SOLN: INTRAVENOUS | Status: DC | PRN

## 2021-03-27 MED ORDER — MANNITOL 25 % IV SOLN: 37.5000 g | Freq: Once | INTRAVENOUS | Status: DC

## 2021-03-27 MED ORDER — ALBUMIN HUMAN 5 % IV SOLN
INTRAVENOUS | Status: AC
  Filled 2021-03-27: qty 500

## 2021-03-27 MED ORDER — SODIUM CHLORIDE 0.9 % IV SOLN
INTRAVENOUS | Status: DC | PRN
  Administered 2021-03-27: 25 ug/min via INTRAVENOUS

## 2021-03-27 MED ORDER — DOCUSATE SODIUM 100 MG PO CAPS: 100.0000 mg | ORAL_CAPSULE | Freq: Two times a day (BID) | ORAL | Status: DC | PRN

## 2021-03-27 SURGICAL SUPPLY — 65 items
APPLICATOR CHLORAPREP 10.5 ORG (MISCELLANEOUS) ×4 IMPLANT
BLADE CLIPPER SPEC (BLADE) ×2 IMPLANT
BNDG GAUZE 4.5X4.1 6PLY STRL (MISCELLANEOUS) ×4 IMPLANT
BNDG GAUZE ELAST 4 BULKY (GAUZE/BANDAGES/DRESSINGS) ×2 IMPLANT
BULB RESERV EVAC DRAIN JP 100C (MISCELLANEOUS) ×2 IMPLANT
BUR ACORN 7.5 PRECISION (BURR) ×2 IMPLANT
BUR SPIRAL ROUTER 2.3 (BUR) ×2 IMPLANT
CLIP VESOCCLUDE MED 6/CT (CLIP) ×2 IMPLANT
COUNTER NEEDLE 20/40 LG (NEEDLE) ×2 IMPLANT
COVER WAND RF STERILE (DRAPES) ×2 IMPLANT
DRAIN CHANNEL JP 10F RND 20C F (MISCELLANEOUS) ×2 IMPLANT
DRAIN JP 10F RND SILICONE (MISCELLANEOUS) ×2 IMPLANT
DRAPE INCISE 23X17 IOBAN STRL (DRAPES) ×2
DRAPE INCISE IOBAN 23X17 STRL (DRAPES) ×2 IMPLANT
DRAPE INCISE IOBAN 66X45 STRL (DRAPES) ×2 IMPLANT
DRAPE POUCH INSTRU U-SHP 10X18 (DRAPES) ×2 IMPLANT
DRAPE SURG 17X11 SM STRL (DRAPES) ×8 IMPLANT
DRAPE WARM FLUID 44X44 (DRAPES) ×2 IMPLANT
DRSG TEGADERM 4X4.75 (GAUZE/BANDAGES/DRESSINGS) ×2 IMPLANT
DRSG TELFA 3X8 NADH (GAUZE/BANDAGES/DRESSINGS) ×2 IMPLANT
ELECT CAUTERY BLADE TIP 2.5 (TIP) ×2
ELECT REM PT RETURN 9FT ADLT (ELECTROSURGICAL) ×2
ELECTRODE CAUTERY BLDE TIP 2.5 (TIP) ×1 IMPLANT
ELECTRODE REM PT RTRN 9FT ADLT (ELECTROSURGICAL) ×1 IMPLANT
GAUZE SPONGE 4X4 12PLY STRL (GAUZE/BANDAGES/DRESSINGS) ×10 IMPLANT
GAUZE XEROFORM 1X8 LF (GAUZE/BANDAGES/DRESSINGS) ×2 IMPLANT
GLOVE SURG SYN 8.5  E (GLOVE) ×4
GLOVE SURG SYN 8.5 E (GLOVE) ×4 IMPLANT
GOWN SRG XL LVL 3 NONREINFORCE (GOWNS) ×2 IMPLANT
GOWN STRL NON-REIN TWL XL LVL3 (GOWNS) ×2
GRADUATE 1200CC STRL 31836 (MISCELLANEOUS) ×2 IMPLANT
GRAFT DURAGEN MATRIX 5WX7L (Graft) ×2 IMPLANT
HEMOSTAT SURGICEL 2X14 (HEMOSTASIS) IMPLANT
HEMOSTAT SURGICEL 2X3 (HEMOSTASIS) ×2 IMPLANT
HEMOSTAT SURGICEL 4X8 (HEMOSTASIS) IMPLANT
HEMOVAC 400ML (MISCELLANEOUS) ×2
HOOK STAY BLUNT/RETRACTOR 5M (MISCELLANEOUS) ×2 IMPLANT
KIT DRAIN HEMOVAC JP 7FR 400ML (MISCELLANEOUS) ×1 IMPLANT
KIT TURNOVER KIT A (KITS) ×2 IMPLANT
MANIFOLD NEPTUNE II (INSTRUMENTS) ×2 IMPLANT
MARKER SKIN DUAL TIP RULER LAB (MISCELLANEOUS) ×4 IMPLANT
MAT ABSORB  FLUID 56X50 GRAY (MISCELLANEOUS) ×1
MAT ABSORB FLUID 56X50 GRAY (MISCELLANEOUS) ×1 IMPLANT
NS IRRIG 1000ML POUR BTL (IV SOLUTION) ×4 IMPLANT
PACK CRANIOTOMY CUSTOM (CUSTOM PROCEDURE TRAY) ×2 IMPLANT
PAD ARMBOARD 7.5X6 YLW CONV (MISCELLANEOUS) ×4 IMPLANT
PIN MAYFIELD SKULL DISP (PIN) ×2 IMPLANT
PLATE 1.5  2HOLE LNG NEURO (Plate) ×1 IMPLANT
PLATE 1.5 2HOLE LNG NEURO (Plate) ×1 IMPLANT
PLATE 1.5/0.5 18.5MM BURR HOLE (Plate) ×6 IMPLANT
SCREW SELF DRILL HT 1.5/4MM (Screw) ×24 IMPLANT
SHEET NEURO XL SOL CTL (MISCELLANEOUS) ×2 IMPLANT
SOL PREP PVP 2OZ (MISCELLANEOUS) ×2
SOLUTION PREP PVP 2OZ (MISCELLANEOUS) ×1 IMPLANT
SPOGE SURGIFLO 8M (HEMOSTASIS) ×1
SPONGE SURGIFLO 8M (HEMOSTASIS) ×1 IMPLANT
STAPLER SKIN PROX 35W (STAPLE) ×6 IMPLANT
SURGILUBE 2OZ TUBE FLIPTOP (MISCELLANEOUS) ×2 IMPLANT
SUT NURALON 4 0 TR CR/8 (SUTURE) ×2 IMPLANT
SUT VIC AB 2-0 CT1 18 (SUTURE) ×8 IMPLANT
SYR 20ML LL LF (SYRINGE) ×4 IMPLANT
TAPE CLOTH 3X10 WHT NS LF (GAUZE/BANDAGES/DRESSINGS) ×2 IMPLANT
TOWEL OR 17X26 4PK STRL BLUE (TOWEL DISPOSABLE) ×4 IMPLANT
TRAY FOLEY SLVR 16FR TEMP STAT (SET/KITS/TRAYS/PACK) ×2 IMPLANT
WATER STERILE IRR 1000ML POUR (IV SOLUTION) ×2 IMPLANT

## 2021-03-27 NOTE — Anesthesia Preprocedure Evaluation (Signed)
Anesthesia Evaluation  Patient identified by MRN, date of birth, ID band Patient unresponsive    Reviewed: Allergy & Precautions, H&P , NPO status , Patient's Chart, lab work & pertinent test results  Airway Mallampati: Intubated  TM Distance: >3 FB Neck ROM: Full    Dental no notable dental hx. (+) Teeth Intact, Dental Advisory Given   Pulmonary Current Smoker and Patient abstained from smoking.,  intubated      + intubated    Cardiovascular Exercise Tolerance: Good +CHF and + DVT  Normal cardiovascular exam+ Valvular Problems/Murmurs MR      Neuro/Psych  Headaches, Pupil blown, and unresponsive negative psych ROS   GI/Hepatic negative GI ROS, Neg liver ROS,   Endo/Other  negative endocrine ROS  Renal/GU Renal disease  negative genitourinary   Musculoskeletal negative musculoskeletal ROS (+)   Abdominal   Peds  Hematology  (+) Blood dyscrasia, anemia ,   Anesthesia Other Findings Past Medical History: No date: Allergy 03/05/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     Comment:  Enterococcus faecalis No date: Headache 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     Comment:  Complex valvuloplasty including quadrangular resection               of infected anterior leaflet with 28 mm Medtronic               Sinuform ring annuloplasty  Reproductive/Obstetrics negative OB ROS                             Anesthesia Physical  Anesthesia Plan  ASA: IV and emergent  Anesthesia Plan: General   Post-op Pain Management:    Induction: Intravenous  PONV Risk Score and Plan:   Airway Management Planned: Oral ETT  Additional Equipment: Arterial line  Intra-op Plan:   Post-operative Plan: Post-operative intubation/ventilation  Informed Consent: I have reviewed the patients History and Physical, chart, labs and discussed the procedure including the risks, benefits  and alternatives for the proposed anesthesia with the patient or authorized representative who has indicated his/her understanding and acceptance.     Dental advisory given  Plan Discussed with: CRNA and Surgeon  Anesthesia Plan Comments:         Anesthesia Quick Evaluation

## 2021-03-27 NOTE — Consult Note (Addendum)
Referring Physician:  No referring provider defined for this encounter.  Primary Physician:  Patient, No Pcp Per (Inactive)  Chief Complaint:  Comatose  Jerome Mccormick is unable to participate in this history and physical examination due to altered mental status.  History of Present Illness: 03/11/2021 Jerome Mccormick is a 30 y.o. male who presents with the chief complaint of being found down.  He recently had open heart surgery at [facility].  He was last seen at 1030 or shortly before then.  He was brought to [facility], where he was intubated.  HCT showed large subdural hematoma.  He was given rocuronium and sedation for rapid sequence intubation, and is currently chemically paralyzed.   He is unable to give any history.     Review of Systems:  A 10 point review of systems is negative, except for the pertinent positives and negatives detailed in the HPI.  Past Medical History: Past Medical History:  Diagnosis Date  . Allergy   . 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 Headache   . Postinfectious glomerulonephritis   . 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 MITRAL VALVE REPAIR N/A 03/21/2021   Procedure: MITRAL VALVE REPAIR (MVR) USING MEDTRONIC 28MM SIMUFORM SEMI-RIGID ANNULOPLASTY RING;  Surgeon: Rexene Alberts, MD;  Location: [place];  Service: Open Heart Surgery;  Laterality: N/A;  . TEE WITHOUT CARDIOVERSION N/A 03/06/2021   Procedure: TRANSESOPHAGEAL ECHOCARDIOGRAM (TEE);  Surgeon: Kate Sable, MD;  Location: [facility];  Service: Cardiovascular;  Laterality: N/A;  . TEE WITHOUT CARDIOVERSION N/A 03/21/2021   Procedure: TRANSESOPHAGEAL ECHOCARDIOGRAM (TEE);  Surgeon: Rexene Alberts, MD;  Location: [place];  Service: Open Heart Surgery;  Laterality:  N/A;    Allergies: Allergies as of 03/07/2021  . (No Known Allergies)    Medications:  Current Facility-Administered Medications:  .  nicardipine (CARDENE) 61m in 0.86% saline 202mIV infusion (0.1 mg/ml), 3-15 mg/hr, Intravenous, Continuous, JeBlake DivineMD, Last Rate: 125 mL/hr at 03/10/2021 1304, 12.5 mg/hr at 03/06/2021 1304 .  propofol (DIPRIVAN) 1000 MG/100ML infusion, 5-80 mcg/kg/min, Intravenous, Continuous, JeBlake DivineMD, Last Rate: 2.39 mL/hr at 03/28/2021 1222, 5 mcg/kg/min at 03/22/2021 1222  Current Outpatient Medications:  .  acetaminophen (TYLENOL) 325 MG tablet, Take 2 tablets (650 mg total) by mouth every 6 (six) hours as needed for mild pain, moderate pain, fever or headache (or Fever >/= 101)., Disp: , Rfl:  .  amLODipine (NORVASC) 10 MG tablet, Take 1 tablet (10 mg total) by mouth daily., Disp: 30 tablet, Rfl: 3 .  ampicillin IVPB, Inject 12 g into the vein daily. As a continuous infusion. Indication: Endocarditis First Dose: yes Last Day of Therapy: 05/02/21 Labs - Once weekly:  CBC/D and BMP, Labs - Every other week:  ESR and CRP Method of administration: Ambulatory Pump (Continuous Infusion) Method of administration may be changed at the discretion of home infusion pharmacist based upon assessment of the patient and/or caregiver's ability to self-administer the medication ordered., Disp: 38 Units, Rfl: 0 .  aspirin EC 325 MG EC tablet, Take 1 tablet (325 mg total) by mouth daily., Disp: 30 tablet, Rfl: 0 .  cefTRIAXone (ROCEPHIN) IVPB, Inject 2 g into the vein every 12 (twelve) hours. Indication: Endocarditis First Dose: yes Last Day of Therapy: 05/02/21 Labs - Once weekly:  CBC/D and BMP, Labs - Every other week:  ESR and CRP Method of administration: IV Push Method of administration may be changed at the discretion of home infusion pharmacist based upon assessment of the patient and/or caregiver's ability to self-administer the medication ordered., Disp: 76 Units, Rfl:  0 .  metoprolol tartrate (LOPRESSOR) 100 MG tablet, Take 1 tablet (100 mg total) by mouth 2 (two) times daily., Disp: 60 tablet, Rfl: 3 .  nicotine (NICODERM CQ - DOSED IN MG/24 HOURS) 14 mg/24hr patch, Place 1 patch (14 mg total) onto the skin daily., Disp: 28 patch, Rfl: 0 .  oxyCODONE (OXY IR/ROXICODONE) 5 MG immediate release tablet, Take 1 tablet (5 mg total) by mouth every 4 (four) hours as needed for severe pain., Disp: 30 tablet, Rfl: 0 .  traZODone (DESYREL) 50 MG tablet, Take 0.5 tablets (25 mg total) by mouth at bedtime as needed for sleep., Disp: , Rfl:    Social History: Social History   Tobacco Use  . Smoking status: Current Every Day Smoker    Packs/day: 0.75    Types: Cigarettes  . Smokeless tobacco: Never Used  Substance Use Topics  . Alcohol use: Yes    Comment: >100 oz per week - beer  . Drug use: Yes    Types: Marijuana    Family Medical History: Family History  Problem Relation Age of Onset  . Hypertension Mother     Physical Examination: Vitals:   03/12/2021 1310 03/19/2021 1315  BP: 136/78 132/75  Pulse: (!) 116 (!) 113  Resp: 19 (!) 23  SpO2: 100% 100%     General: Patient is well developed. He is comatose.  Psychiatric: Unable to asses.  Head:  Pupils 8m minimally reactive.  ENT:  Oral mucosa appears well hydrated.  Neck:   Cannot examine  Respiratory: On ventilator  Extremities: No edema.  Vascular: Palpable pulses in dorsal pedal vessels.  Skin:   On exposed skin, there are no abnormal skin lesions other than midline sternotomy scar.  NEUROLOGICAL:  General: Intubated, sedated, paralyzed.  Does not open eyes, follow commands, regard.  Pupils 826mbilaterally, minimally reactive.  No additional CN or motor exam due to paralytic on board.  Sensory examination not possible due to coma.  Additional examination deferred.  Imaging: HCT 03/19/2021 IMPRESSION: HEAD CT:  Development of a large intraparenchymal hematoma in the  left parieto-occipital junction region measuring 6.1 x 3.9 x 5.2 cm (volume 65 cm^3). Acute subdural hematoma along the lateral convexity on the left, maximal thickness 1 cm. Mass effect with left-to-right shift of 1.6 cm. At MRI 1 week ago, there was low level hemorrhage in the location now occupied by the large hematoma. Findings at that time were hypothesized to relate to posterior reversible encephalopathy, but certainly septic emboli were and are not excluded.  Critical Value/emergent results were called by telephone at the time of interpretation on 03/11/2021 at 12:53 pm [facility]ally [facility] these results.  CERVICAL SPINE CT:  No acute or traumatic finding. Head bent forward.   Electronically Signed   By: MaNelson Chimes.D.   On: 03/09/2021 12:58  I have personally reviewed the images and agree with the above interpretation.  Labs: CBC Latest Ref Rng & Units 03/05/2021 03/25/2021 03/23/2021  WBC 4.0 - 10.5 K/uL 16.9(H) 8.5 9.4  Hemoglobin 13.0 - 17.0 g/dL 10.7(L) 8.6(L) 9.3(L)  Hematocrit 39.0 - 52.0 % 33.0(L) 27.2(L) 28.9(L)  Platelets 150 - 400 K/uL 574(H) 349 228  Assessment and Plan: Jerome Mccormick is a pleasant 30 y.o. male with large L subdural hematoma and an area of underlying intraparenchymal hemorrhage. He is comatose.  He has no exam currently due to paralytics and sedation.  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 He will need a CT angiogram at some point to evaluate this.  We will try to get this en route to the operating room.  Given his age and premorbid functional status, I discussed craniotomy for subdural hematoma with his mother.  I relayed to her that her son would pass away without intervention, and that he was critically ill and had a grave prognosis.  Even with intervention, he may not survive.  We have given hypertonic saline, and will try to move forward to the operating room  as a life-saving measure.  We will need to transfer for neuro critical care monitoring once surgery is completed.    I reviewed the risks with the mother, including the risk of death.  She asked that we take him to the operating room to try to save his life.   Fotini Lemus K. Izora Ribas MD, [place] Dept. of Neurosurgery   Level 5 consultation - patient is unable to participate in history and physical examination

## 2021-03-27 NOTE — ED Notes (Signed)
CRNA going to give more rocuronium prior to transport. CN went to pull from pyxis. CRNA Norwood Levo will put in order once in OR.

## 2021-03-27 NOTE — ED Notes (Signed)
Called pharmacy to come retrieve bottle of oxycodone that belongs to pt, brought with EMS. E<S states that pt had reportedly taken 10 tablets of oxycodone 5mg  from bottle (unknown time). Bottle had 30 tablets. Will count tablets with pharmacy tech when they come to retrieve bottle.

## 2021-03-27 NOTE — ED Notes (Signed)
RT at bedside.

## 2021-03-27 NOTE — ED Notes (Signed)
Pharmacy on way with hypertonic saline.

## 2021-03-27 NOTE — ED Notes (Signed)
Pharmacy at bedside

## 2021-03-27 NOTE — ED Notes (Signed)
Pt to OR.

## 2021-03-27 NOTE — ED Notes (Signed)
X-ray at bedside

## 2021-03-27 NOTE — ED Notes (Signed)
bACK IN ROOM. Edp, rt, STROKE COORDINATOR, NEUROLOGIST AT BEDSIDE.

## 2021-03-27 NOTE — ED Notes (Signed)
3% hypertonic saline bolus was stopped at 1310. Unable to "stop" bolus in [facility] because had previously documented rate/dose change to 53mL/hr per neurologist verbal order under bolus (not as new order). However, bolus of 3% normal saline was stopped at 1310, at the same time maintenance rate of 21mL/hr was initiated.

## 2021-03-27 NOTE — ED Notes (Signed)
Mother and grandmother being excorted to ICU waiting room by chaplain.

## 2021-03-27 NOTE — Progress Notes (Signed)
PHARMACY CONSULT NOTE - FOLLOW UP  Pharmacy Consult for Electrolyte Monitoring and Replacement   Recent Labs: Potassium (mmol/L)  Date Value  03/18/2021 3.2 (L)  03/11/2014 3.7   Magnesium (mg/dL)  Date Value  48/25/0037 1.6 (L)   Calcium (mg/dL)  Date Value  04/88/8916 8.8 (L)   Calcium, Total (mg/dL)  Date Value  94/50/3888 8.6   Albumin (g/dL)  Date Value  28/00/3491 3.4 (L)  03/11/2014 3.7   Phosphorus (mg/dL)  Date Value  79/15/0569 5.5 (H)   Sodium (mmol/L)  Date Value  03/08/2021 140  03/11/2014 142     Assessment: 30yo male with past medical history for postinfectious glomerulonephritis, bacterial endocarditis (on IV antibiotics - ceftriaxone and ampicillin until 05/02/21), s/p mitral valve repair with large intraparenchymal hematoma with extra-axial extension. Pharmacy has been consulted for electrolyte management.   Goal of Therapy:  Electrolytes WNL  Plan:   K 3.2 - will replace with IV KCl x 5 runs  Mg 1.6 - will replace with 2g IV Mg sulfate  Re-check electrolytes with AM labs  Raiford Noble, PharmD Pharmacy Resident  03/07/2021 4:20 PM

## 2021-03-27 NOTE — Telephone Encounter (Signed)
Received phone message from [place], [facility] RN, requesting nausea medication and anxiety medication for patient. VM left for return call on HHRN's provided number. Upon return call, [facility] informed that patient was transported to [facility] ED. Will notify Dr. Cornelius Moras.

## 2021-03-27 NOTE — ED Notes (Signed)
Pharmacist at bedside. Oxycodone tablets counted. 20 in bottle. Pharmacist brought bottle of 20 remaining tablets to pharmacy in secure bag.

## 2021-03-27 NOTE — Progress Notes (Signed)
Patient ID: Jerome Mccormick, male   DOB: 1991-06-25, 30 y.o.   MRN: [id number] BP (!) 148/92   Pulse (!) 110   Resp (!) 23   SpO2 100%  I was called at [facility] about Mr. Meunier. In brief he has an acute subdural hematoma, a parietal ich, status post surgery:MITRAL VALVE REPAIR (MVR) USING MEDTRONIC SIMUFORM SEMI-RIGID ANNULOPLASTY RING (N/A) TRANSESOPHAGEAL ECHOCARDIOGRAM (TEE) (N/A)   for endocarditis here at [facility] approximately one month ago. Reported by the ED physician to have extensor posturing, a fixed left dilated pupil, and agonal respirations. No corneals, oculocephalics, cough, or gag reflex were tested. He was emergently intubated and paralyzed at that time with Rocuronium obviating the ability to examine him neurologically. Dr. Amada Jupiter contacted me prior to my conversation with the ED physician. I have just been informed that Dr. Myer Haff at [facility] will perform the surgery at [facility].

## 2021-03-27 NOTE — ED Provider Notes (Signed)
[facility] Emergency Department Provider Note   ____________________________________________   Event Date/Time   First MD Initiated Contact with Patient 03/09/2021 1225     (approximate)  I have reviewed the triage vital signs and the nursing notes.   HISTORY  Chief Complaint Loss of Consciousness (Posturing/)    HPI Jerome Mccormick is a 30 y.o. male with past medical history of endocarditis status post mitral valve repair, recurrent bacteremia, upper extremity DVT, and postinfectious glomerulonephritis presents to the ED for altered mental status.  History is limited as patient arrives unresponsive.  Per EMS, home health aide noted that he went into the bathroom at home between 1015 and 1030 this morning.  Home health aide then noticed that he was taking longer than usual in the bathroom.  She opened the door to find him collapsed on the ground and unresponsive.  EMS was called, on arrival patient noted to have dilated left pupil with posturing, no seizure activity was noted.  Patient remains unresponsive on arrival.        Past Medical History:  Diagnosis Date  . Allergy   . 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 Headache   . Postinfectious glomerulonephritis   . 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 S/P craniotomy 03/14/2021  . ICH (intracerebral hemorrhage) ([place]) 03/14/2021  . Post-infectious glomerulonephritis   . 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 S/P MVR (mitral valve repair) 03/21/2021  . Arm DVT (deep venous thromboembolism), acute, right ([place]) 03/19/2021  . Acute diastolic CHF (congestive heart failure) ([place]) 03/16/2021  . History of anemia due to chronic kidney disease 03/16/2021  . Nicotine dependence 03/16/2021  . Hyperkalemia  03/16/2021  . Acute glomerulonephritis syndrome 03/16/2021  . Mitral valve disease   . Cardiac murmur 03/05/2021  . 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 Nonrheumatic mitral valve regurgitation   . Anemia 03/03/2021  . AKI (acute kidney injury) ([place]) 03/02/2021    Past Surgical History:  Procedure Laterality Date  . MITRAL VALVE REPAIR N/A 03/21/2021   Procedure: MITRAL VALVE REPAIR (MVR) USING MEDTRONIC 28MM SIMUFORM SEMI-RIGID ANNULOPLASTY RING;  Surgeon: Rexene Alberts, MD;  Location: [place];  Service: Open Heart Surgery;  Laterality: N/A;  . TEE WITHOUT CARDIOVERSION N/A 03/06/2021   Procedure: TRANSESOPHAGEAL ECHOCARDIOGRAM (TEE);  Surgeon: Kate Sable, MD;  Location: [facility];  Service: Cardiovascular;  Laterality: N/A;  . TEE WITHOUT CARDIOVERSION N/A 03/21/2021   Procedure: TRANSESOPHAGEAL ECHOCARDIOGRAM (TEE);  Surgeon: Rexene Alberts, MD;  Location: [place];  Service: Open Heart Surgery;  Laterality: N/A;    Prior to Admission medications   Medication Sig Start Date End Date Taking? Authorizing Provider  acetaminophen (TYLENOL) 325 MG tablet Take 2 tablets (650 mg total) by mouth every 6 (six) hours as needed for mild pain, moderate pain, fever or headache (or Fever >/= 101). 03/11/21   Wyvonnia Dusky, MD  amLODipine (NORVASC) 10 MG tablet Take 1 tablet (10 mg total) by mouth daily. 03/26/21   Barrett, Erin R, PA-C  ampicillin IVPB Inject 12 g into the vein daily. As a continuous infusion. Indication: Endocarditis First Dose: yes Last Day of Therapy: 05/02/21 Labs - Once weekly:  CBC/D and BMP, Labs - Every other week:  ESR and CRP Method of administration: Ambulatory Pump (Continuous Infusion) Method  of administration may be changed at the discretion of home infusion pharmacist based upon assessment of the patient and/or caregiver's ability to self-administer the medication ordered. 03/26/21 05/03/21  Barrett, Lodema Hong, PA-C  aspirin EC 325 MG EC tablet Take 1 tablet (325 mg  total) by mouth daily. 03/25/21   Barrett, Erin R, PA-C  cefTRIAXone (ROCEPHIN) IVPB Inject 2 g into the vein every 12 (twelve) hours. Indication: Endocarditis First Dose: yes Last Day of Therapy: 05/02/21 Labs - Once weekly:  CBC/D and BMP, Labs - Every other week:  ESR and CRP Method of administration: IV Push Method of administration may be changed at the discretion of home infusion pharmacist based upon assessment of the patient and/or caregiver's ability to self-administer the medication ordered. 03/26/21 05/03/21  Barrett, Erin R, PA-C  metoprolol tartrate (LOPRESSOR) 100 MG tablet Take 1 tablet (100 mg total) by mouth 2 (two) times daily. 03/26/21   Barrett, Erin R, PA-C  nicotine (NICODERM CQ - DOSED IN MG/24 HOURS) 14 mg/24hr patch Place 1 patch (14 mg total) onto the skin daily. 03/19/21   Sharen Hones, MD  oxyCODONE (OXY IR/ROXICODONE) 5 MG immediate release tablet Take 1 tablet (5 mg total) by mouth every 4 (four) hours as needed for severe pain. 03/26/21   Barrett, Erin R, PA-C  traZODone (DESYREL) 50 MG tablet Take 0.5 tablets (25 mg total) by mouth at bedtime as needed for sleep. 03/19/21   Sharen Hones, MD    Allergies Patient has no known allergies.  Family History  Problem Relation Age of Onset  . Hypertension Mother     Social History Social History   Tobacco Use  . Smoking status: Current Every Day Smoker    Packs/day: 0.75    Types: Cigarettes  . Smokeless tobacco: Never Used  Substance Use Topics  . Alcohol use: Yes    Comment: >100 oz per week - beer  . Drug use: Yes    Types: Marijuana    Review of Systems Unable to obtain secondary to altered mental status  ____________________________________________   PHYSICAL EXAM:  VITAL SIGNS: ED Triage Vitals  Enc Vitals Group     BP 03/05/2021 1220 (!) 144/94     Pulse Rate 03/12/2021 1213 (!) 114     Resp 03/23/2021 1213 (!) 23     Temp --      Temp src --      SpO2 03/20/2021 1213 93 %     Weight --      Height  --      Head Circumference --      Peak Flow --      Pain Score --      Pain Loc --      Pain Edu? --      Excl. in [place]? --     Constitutional: Unresponsive. Eyes: Left pupil fixed and dilated, right pupil 5 mm and minimally reactive. Head: Atraumatic. Nose: No congestion/rhinnorhea. Mouth/Throat: Mucous membranes are moist. Neck: No step-offs noted. Cardiovascular: Tachycardic, regular rhythm. Grossly normal heart sounds. Respiratory: Agonal respirations. Gastrointestinal: Soft and nondistended. Genitourinary: deferred Musculoskeletal: 2+ pitting edema to mid thighs bilaterally. Neurologic: Extensor posturing bilaterally. Skin:  Skin is warm, dry and intact. No rash noted. Psychiatric: Unable to assess.  ____________________________________________   LABS (all labs ordered are listed, but only abnormal results are displayed)  Labs Reviewed  CBC WITH DIFFERENTIAL/PLATELET - Abnormal; Notable for the following components:      Result Value   WBC 16.9 (*)  RBC 3.67 (*)    Hemoglobin 10.7 (*)    HCT 33.0 (*)    RDW 16.2 (*)    Platelets 574 (*)    Neutro Abs 11.0 (*)    Monocytes Absolute 1.2 (*)    Abs Immature Granulocytes 0.21 (*)    All other components within normal limits  COMPREHENSIVE METABOLIC PANEL - Abnormal; Notable for the following components:   Potassium 3.2 (*)    Chloride 95 (*)    Glucose, Bld 219 (*)    BUN 23 (*)    Creatinine, Ser 1.39 (*)    Calcium 8.8 (*)    Albumin 3.4 (*)    AST 45 (*)    ALT 89 (*)    Anion gap 17 (*)    All other components within normal limits  PROTIME-INR - Abnormal; Notable for the following components:   Prothrombin Time 16.9 (*)    INR 1.4 (*)    All other components within normal limits  URINALYSIS, COMPLETE (UACMP) WITH MICROSCOPIC - Abnormal; Notable for the following components:   Color, Urine STRAW (*)    APPearance CLEAR (*)    Glucose, UA 50 (*)    Hgb urine dipstick LARGE (*)    Protein, ur 100 (*)     All other components within normal limits  MAGNESIUM - Abnormal; Notable for the following components:   Magnesium 1.6 (*)    All other components within normal limits  OSMOLALITY - Abnormal; Notable for the following components:   Osmolality 297 (*)    All other components within normal limits  TROPONIN I (HIGH SENSITIVITY) - Abnormal; Notable for the following components:   Troponin I (High Sensitivity) 356 (*)    All other components within normal limits  RESP PANEL BY RT-PCR (FLU A&B, COVID) ARPGX2  CULTURE, BLOOD (ROUTINE X 2)  CULTURE, BLOOD (ROUTINE X 2)  CULTURE, BLOOD (ROUTINE X 2)  CULTURE, BLOOD (ROUTINE X 2)  OSMOLALITY, URINE  SODIUM, URINE, RANDOM  SODIUM  SODIUM  SODIUM  SODIUM  SODIUM  SODIUM  DRUG PROFILE, UR, 9 DRUGS ([facility])  COMPREHENSIVE METABOLIC PANEL  LACTIC ACID, PLASMA  LACTIC ACID, PLASMA  CBC  URINALYSIS, ROUTINE W REFLEX MICROSCOPIC  BLOOD GAS, ARTERIAL  TROPONIN I (HIGH SENSITIVITY)   ____________________________________________  EKG  ED ECG REPORT I, Blake Divine, the attending physician, personally viewed and interpreted this ECG.   Date: 03/17/2021  EKG Time: 12:17  Rate: 126  Rhythm: sinus tachycardia  Axis: RAD  Intervals:Prolonged QT  ST&T Change: None   PROCEDURES  Procedure(s) performed (including Critical Care):  .Critical Care Performed by: Blake Divine, MD Authorized by: Blake Divine, MD   Critical care provider statement:    Critical care time (minutes):  75   Critical care time was exclusive of:  Separately billable procedures and treating other patients and teaching time   Critical care was necessary to treat or prevent imminent or life-threatening deterioration of the following conditions:  CNS failure or compromise   Critical care was time spent personally by me on the following activities:  Discussions with consultants, evaluation of patient's response to treatment, examination of patient, ordering  and performing treatments and interventions, ordering and review of laboratory studies, ordering and review of radiographic studies, pulse oximetry, re-evaluation of patient's condition, obtaining history from patient or surrogate and review of old charts   I assumed direction of critical care for this patient from another provider in my specialty: no  Care discussed with: admitting provider    Procedure Name: Intubation Date/Time: 03/11/2021 4:21 PM Performed by: Blake Divine, MD Pre-anesthesia Checklist: Patient identified, Patient being monitored, Emergency Drugs available, Timeout performed and Suction available Oxygen Delivery Method: Ambu bag Preoxygenation: Pre-oxygenation with 100% oxygen Induction Type: Rapid sequence Ventilation: Mask ventilation without difficulty Laryngoscope Size: Glidescope and 4 Grade View: Grade I Tube size: 8.0 mm Number of attempts: 1 Airway Equipment and Method: Video-laryngoscopy Placement Confirmation: ETT inserted through vocal cords under direct vision,  CO2 detector and Breath sounds checked- equal and bilateral Secured at: 25 cm Tube secured with: ETT holder Dental Injury: Teeth and Oropharynx as per pre-operative assessment         ____________________________________________   INITIAL IMPRESSION / ASSESSMENT AND PLAN / ED COURSE       30 year old male with chronic endocarditis requiring recent complex mitral valve replacement along with continuous antibiotic infusion presents to the ED for acute onset altered mental status and unresponsiveness after being found down at home around 10:30 AM.  On arrival, patient noted to have extensor posturing with fixed and dilated left pupil, minimally responsive right pupil.  Code stroke was called.  He was emergently intubated, subsequently taken immediately to CT scan, which revealed large left-sided intraparenchymal hemorrhage with subdural extension.  Patient was loaded with hypertonic saline  via pre-existing right upper extremity PICC line and he was started on Cardene for blood pressure control.  Case discussed with Dr. Cari Caraway of neurosurgery here at [facility] along with Dr. Christella Noa of neurosurgery at [facility].  While initial plan was for transfer to [facility] for surgical intervention, we do have the capacity here at [facility] for craniotomy.  In order to perform the procedure as quickly as possible, decision was made to proceed with craniotomy here at [facility], after which patient may be admitted to the ICU.  Once stabilized, patient will require transfer to [facility] given his complex cardiac history and recent cardiothoracic procedure.  Dr. Leonel Ramsay evaluated patient as part of code stroke process and will help facilitate eventual transfer to [facility].      ____________________________________________   FINAL CLINICAL IMPRESSION(S) / ED DIAGNOSES  Final diagnoses:  Intracranial hemorrhage ([place])  Chronic endocarditis, unspecified endocarditis type     ED Discharge Orders    None       Note:  This document was prepared using Dragon voice recognition software and may include unintentional dictation errors.   Blake Divine, MD 03/04/2021 [phone]

## 2021-03-27 NOTE — ED Notes (Signed)
Pt being intubated by EDP. RT at bedside.

## 2021-03-27 NOTE — ED Notes (Signed)
PT TO ct

## 2021-03-27 NOTE — ED Notes (Signed)
OR nurse will call back when OR ready for pt.  Dr Marcell Barlow told CN that if CTA delays OR, go straight to OR.

## 2021-03-27 NOTE — Progress Notes (Signed)
Dr. Myer Haff messaged and called regarding increased bleeding from the patient's craniotomy site. The day shift RN had marked the bleeding border with a marker on the bedsheet. The bleeding has continued and has progressed passed the marked border. Dr. Myer Haff stated that he is currently in the OR and will come by and assess the patient after he is finished. He requested that sedation be turned off so that he can assess neuro status. Will continue to monitor.  Carmel Sacramento, RN

## 2021-03-27 NOTE — ED Notes (Signed)
12 lead EKG performed

## 2021-03-27 NOTE — ED Notes (Signed)
Pt covered in several warm blankets to help correct hypothermia. Spoke with EDP regarding this. Pt is about to go to OR.

## 2021-03-27 NOTE — Transfer of Care (Addendum)
Immediate Anesthesia Transfer of Care Note  Patient: LONNELL CHAPUT  Procedure(s) Performed: CRANIOTOMY HEMATOMA EVACUATION SUBDURAL (Left )  Patient Location: ICU  Anesthesia Type:General  Level of Consciousness: sedated and Patient remains intubated per anesthesia plan  Airway & Oxygen Therapy: Patient remains intubated per anesthesia plan and Patient placed on Ventilator (see vital sign flow sheet for setting)  Post-op Assessment: Report given to RN and Post -op Vital signs reviewed and stable  Post vital signs: Reviewed and stable  Last Vitals:  Vitals Value Taken Time  BP 141/90 03/19/2021 1705  Temp 34.5 C 03/13/2021 1706  Pulse 97 03/07/2021 1706  Resp 16 03/23/2021 1706  SpO2 97 % 03/16/2021 1706  Vitals shown include unvalidated device data.  Last Pain:  Vitals:   03/30/2021 1354  TempSrc: Core (Comment)         Complications: Pt transferred to ICU w/full monitors, ambu bag MMV, pt hemodynamically stable sbp> 120-130's, sats=98-100's, throughout transport, 3% NS infusion cont'ed per surgeon, full report given to ICU care RN, and ICU attending. Pt attached to ICU monitors, vent, and care assumed/transferred to ICU team w/o incident TFH

## 2021-03-27 NOTE — Progress Notes (Signed)
As sedation was decreased and turned off per Dr. Myer Haff, the patient began to have tremors/jerking and was posturing significantly. HR and BP sharply increased. This RN was concerned about patient condition and therefore called the provider again with findings. Provider stated to keep sedation off until he arrived. Dr. Myer Haff arrived to bedside a few minutes later and stated to restart his sedation. He assessed the patient's craniotomy site and re-wrapped the area with gauze due to new bleeding. With sedation resumed, the tremors and posturing subsided. Will continue to monitor the patient and the surgical site for bleeding.  Carmel Sacramento, RN

## 2021-03-27 NOTE — ED Notes (Signed)
Mother and grandmother at bedside. Mother weeping. This nurse comforting mother. Chaplain at bedside.

## 2021-03-27 NOTE — ED Notes (Signed)
Neuro surgery at bedside.

## 2021-03-27 NOTE — Progress Notes (Signed)
PHARMACY CONSULT NOTE - FOLLOW UP  Pharmacy Consult for Electrolyte Monitoring and Replacement   Recent Labs: Potassium (mmol/L)  Date Value  2021-04-23 3.2 (L)  03/11/2014 3.7   Magnesium (mg/dL)  Date Value  78/58/8502 1.6 (L)   Calcium (mg/dL)  Date Value  77/41/2878 8.8 (L)   Calcium, Total (mg/dL)  Date Value  67/67/2094 8.6   Albumin (g/dL)  Date Value  70/96/2836 3.4 (L)  03/11/2014 3.7   Phosphorus (mg/dL)  Date Value  62/94/7654 5.5 (H)   Sodium (mmol/L)  Date Value  2021-04-23 140  03/11/2014 142     Assessment: 30yo male with past medical history for postinfectious glomerulonephritis, bacterial endocarditis (on IV antibiotics - ceftriaxone and ampicillin until 05/02/21), s/p mitral valve repair with large intraparenchymal hematoma with extra-axial extension. Pharmacy has been consulted for electrolyte management.   Pt is also on hypertonic saline @75ml /hr for intracerebral hemorrhage status post craniotomy and evacuation  4/27 @1225  Na 138 4/27 @1300  Na 140 4/27 @1714  Na 142  Goal of Therapy:  Electrolytes WNL Na 150 mmol/L  Plan:   Na 142 - will notify MD if >4 mEq/L in 2 hours or >6 mEq/L in 4 hours  Re-check electrolytes with AM labs and f/u Na in 6 hours  5/27, PharmD Pharmacy Resident  Apr 23, 2021 6:19 PM

## 2021-03-27 NOTE — ED Notes (Signed)
EDP at bedside explaining medical situation to mother and grandmother. EDP explaining plan of care.

## 2021-03-27 NOTE — ED Notes (Signed)
CRNA [place] Levo) and OR tech at bedside to transport pt. RT on way.

## 2021-03-27 NOTE — Op Note (Signed)
Indications: Jerome Mccormick is suffering from an acute subdural hematoma. He has significant brain compression and symptoms, prompting surgical intervention.   Findings: subdural hematoma  Preoperative Diagnosis: Subdural hematoma Postoperative Diagnosis: same   EBL: 250 ml IVF: 500 albumin, 1000 ml crystalloid Drains: subgaleal drain placed Disposition: Intubated and critically ill Complications: none  A foley catheter was previously placed.   Preoperative Note:   This patient presented in a coma.  Due to the emergent nature of the situation, no consent from the patient was obtained. He was taken to the operating room under emergency conditions.  Operative Note:   Procedure:  1) Left craniotomy for subdural hematoma evacuation.    Procedure: The patient was brought into the operating room and pinned.  His head was turned towards the right. A curvilinear incision was planned. The hair was clipped.  The operative site was prepped and draped.  A timeout was performed.    The incision was injected with local.  The incision was made, then opened using Raney clips.  The subgaleal plane was developed towards the ear and zygoma.  The incision was completed, then the temporalis lifted with the skin in a myocutaneous flap.  The flap was held back with fish hooks.  The drill was used to make multiple burr holes,  The burr holes were connected using the craniotome.  The dura was tense.  It was opened, and a large acute subdural hematoma was evacuated.  The subdural space was irrigated.  A small area of bleeding was noted over the parietal lobe. This was controlled.    Intradural hemostasis was achieved.  The site was irrigated again.  The dura was then tacked together, and duragen placed over the dura.  The bone flap was then replaced after placement of tacking sutures in the dura.  A drain was placed.  The temporalis was then closed.  The galea was closed.  Staples were used on the skin.    Sterile dressings were placed. A headwrap was placed.  Sponge and pattie counts were correct at the end of the procedure.   Anabel Halon PA assisted in the procedure.    Venetia Night MD

## 2021-03-27 NOTE — ED Triage Notes (Signed)
Pt to ED from home, [facility] Pt found unconscious on bathroom floor about 1 hour ago by home health nurse Pt had open heart surgery at [facility] about 4 wks ago EDP and RT at bedside pereparing to intubate Pt has agonal breathing and posturing EMS states that pupils are unequal CBG was 183, ETCO2 was 28 Pt did receive 4mg  Narcan with first responders

## 2021-03-27 NOTE — H&P (Addendum)
NAME:  Jerome Mccormick, MRN:  [id number], DOB:  09-30-1991, LOS: 0 ADMISSION DATE:  03/14/2021, CONSULTATION DATE:  03/01/2021 REFERRING MD:  Cari Caraway, CHIEF COMPLAINT:  ICH   History of Present Illness:  This is a 30 yo male with medical history pertinent for recent diagnosis of enterococcal endocarditis.  Patient was transferred to [facility] for surgical intervention.  Patient underwent mitral valve repair with complex valvuloplasty on 03/21/2021.  Patient's postoperative course was uncomplicated.  Chest tubes were removed on postop day 3.  His OR cultures did grow staph capitis on mitral valve tissue.  Patient was to have antibiotics with ampicillin running continuously with intermittent Rocephin dosage.  Patient was apparently discharged on 03/25/2021.  Patient was with his home health aide who walked him to the bathroom this morning.  He was taking a long time and that she went to go check on him.  He was noticed to have been down at this point.  EMS was called.  EMS transported patient to the emergency department.  In route patient was given Narcan.  With poor response.  Patient was intubated in the ED.  In the ED work-up remarkable for the following:  CMP pertinent for elevated creatinine of 1.39 however this is improved from previous creatinines during most recent admission.  CBC with leukocytosis of 16.9 platelets of 574.  Troponin of 356.  Magnesium of 1.6.    CT head notable for the below: HEAD CT:  Development of a large intraparenchymal hematoma in the left parieto-occipital junction region measuring 6.1 x 3.9 x 5.2 cm (volume 65 cm^3). Acute subdural hematoma along the lateral convexity on the left, maximal thickness 1 cm. Mass effect with left-to-right shift of 1.6 cm. At MRI 1 week ago, there was low level hemorrhage in the location now occupied by the large hematoma. Findings at that time were hypothesized to relate to posterior reversible encephalopathy, but certainly septic  emboli were and are not excluded.    Patient emergently taken back to the OR for Craniotomy and evacuation of subdural hematoma.  Patient then transferred to ICU for stabilization.          Pertinent  Medical History  Endocarditis Postinfectious glomerulonephritis DVT   Significant Hospital Events: Including procedures, antibiotic start and stop dates in addition to other pertinent events   . Patient admitted to ICU for stabilization prior to transfer  Interim History / Subjective:  Not applicable  Objective   Blood pressure 125/67, pulse (!) 112, temperature (!) 94.6 F (34.8 C), resp. rate (!) 27, SpO2 100 %.    Vent Mode: AC FiO2 (%):  [100 %] 100 % Set Rate:  [16 bmp] 16 bmp Vt Set:  [500 mL] 500 mL PEEP:  [5 cmH20] 5 cmH20  No intake or output data in the 24 hours ending 03/15/2021 1527 There were no vitals filed for this visit.  Examination: General: Patient intubated and sedated HENT: ET tube in place Lungs: CTAB. mechanical breath sounds appreciated Cardiovascular: RRR. No rubs or gallups Abdomen: Soft non distended Extremities: trace pitting edema in lower extremities Neuro: Pupils equally round with minima reaction to light. Extremely sluggish. Spontaneously breathing above rate. No gag. However patient had just received paralytic GU: Foley in place  Labs/imaging that I havepersonally reviewed  (right click and "Reselect all SmartList Selections" daily)  CT head from 03/07/2021 notes  Resolved Hospital Problem list   Not applicable  Assessment & Plan:  This is a 30 year old male with history of  infective endocarditis who presents status postrepair with altered mental status.  Found to have intracerebral hemorrhage.   Intracerebral hemorrhage status post craniotomy and evacuation. -Monitor CBCs -Keep systolic blood pressure less than 140.  Can use  cardene -Aware that prognosis is guarded and thus palliative consult made -Continue hypertonic saline  with goal of 150 meq -Neuro checks Q 1 hours -Neuro to consult on in AM -Poor prognosis. High likelihood to progress to brain death. Will need palliative on board to help with discussion and preparation.    Encephalopathy secondary to intracerebral hemorrhage and failure to protect airway -Low tidal volume ventilation -Stat ABG when patient presents -Chest x-ray to confirm tube placement   Endocarditis- -Continue ampicillin and ceftriaxone per outpatient regimen -TTE in AM  AKI-continue to monitor electrolytes however creatinine currently improved from previous -Strict I's and O's -Avoid contrast when able   Best practice (right click and "Reselect all SmartList Selections" daily)  Diet:  NPO Pain/Anxiety/Delirium protocol (if indicated): Yes (RASS goal -2) VAP protocol (if indicated): Yes DVT prophylaxis: Contraindicated GI prophylaxis: H2B Glucose control: CBG q 4hour Central venous access: PICC line in place Arterial line: Present Foley:  Yes, and it is still needed Mobility:  bed rest  PT consulted: N/A Last date of multidisciplinary goals of care discussion  Code Status:  full code per mother Disposition: ICU  Labs   CBC: Recent Labs  Lab 03/22/21 0437 03/22/21 1657 03/23/21 0320 03/25/21 0411 03/28/2021 1225  WBC 9.0 10.9* 9.4 8.5 16.9*  NEUTROABS  --   --   --   --  11.0*  HGB 8.7* 9.0* 9.3* 8.6* 10.7*  HCT 26.8* 28.2* 28.9* 27.2* 33.0*  MCV 89.9 91.0 90.3 91.6 89.9  PLT 248 252 228 349 574*    Basic Metabolic Panel: Recent Labs  Lab 03/21/21 1934 03/22/21 0437 03/22/21 1657 03/23/21 0320 03/24/21 0402 03/25/21 0411 03/05/2021 1225 03/05/2021 1300  NA 141 136 135 136 135 135 138 140  K 4.3 4.6 4.3 4.5 4.3 4.1 3.2*  --   CL 107 104 102 102 102 102 95*  --   CO2 '26 25 25 25 27 27 26  ' --   GLUCOSE 121* 114* 104* 103* 87 93 219*  --   BUN 32* 30* 28* 27* 24* 21* 23*  --   CREATININE 1.80* 1.75* 1.68* 1.69* 1.49* 1.47* 1.39*  --   CALCIUM 7.7* 7.9*  8.1* 8.4* 8.5* 8.4* 8.8*  --   MG 2.8* 2.8* 2.6*  --   --   --  1.6*  --    GFR: Estimated Creatinine Clearance: 78.7 mL/min (A) (by C-G formula based on SCr of 1.39 mg/dL (H)). Recent Labs  Lab 03/22/21 1657 03/23/21 0320 03/25/21 0411 03/14/2021 1225  WBC 10.9* 9.4 8.5 16.9*    Liver Function Tests: Recent Labs  Lab 03/21/21 0328 03/16/2021 1225  AST 62* 45*  ALT 56* 89*  ALKPHOS 40 78  BILITOT 0.4 0.6  PROT 5.4* 7.4  ALBUMIN 2.6* 3.4*   No results for input(s): LIPASE, AMYLASE in the last 168 hours. No results for input(s): AMMONIA in the last 168 hours.  ABG    Component Value Date/Time   PHART 7.406 03/21/2021 1757   PCO2ART 45.6 03/21/2021 1757   PO2ART 113 (H) 03/21/2021 1757   HCO3 28.9 (H) 03/21/2021 1757   TCO2 30 03/21/2021 1757   O2SAT 99.0 03/21/2021 1757     Coagulation Profile: Recent Labs  Lab 03/21/21 1346 03/05/2021 1225  INR 1.5* 1.4*    Cardiac Enzymes: No results for input(s): CKTOTAL, CKMB, CKMBINDEX, TROPONINI in the last 168 hours.  HbA1C: Hgb A1c MFr Bld  Date/Time Value Ref Range Status  03/21/2021 03:28 AM 4.9 4.8 - 5.6 % Final    Comment:    (NOTE) Pre diabetes:          5.7%-6.4%  Diabetes:              >6.4%  Glycemic control for   <7.0% adults with diabetes     CBG: Recent Labs  Lab 03/22/21 1522 03/22/21 1931 03/22/21 2349 03/23/21 0323 03/23/21 0735  GLUCAP 109* 114* 106* 104* 102*    Review of Systems:   Unable to attain at this time  Past Medical History:  He,  has a past medical history of Allergy, Bacterial endocarditis (03/05/2021), Headache, Postinfectious glomerulonephritis, and S/P mitral valve repair (03/21/2021).   Surgical History:   Past Surgical History:  Procedure Laterality Date  . MITRAL VALVE REPAIR N/A 03/21/2021   Procedure: MITRAL VALVE REPAIR (MVR) USING MEDTRONIC 28MM SIMUFORM SEMI-RIGID ANNULOPLASTY RING;  Surgeon: Rexene Alberts, MD;  Location: [place];  Service: Open Heart Surgery;   Laterality: N/A;  . TEE WITHOUT CARDIOVERSION N/A 03/06/2021   Procedure: TRANSESOPHAGEAL ECHOCARDIOGRAM (TEE);  Surgeon: Kate Sable, MD;  Location: [facility];  Service: Cardiovascular;  Laterality: N/A;  . TEE WITHOUT CARDIOVERSION N/A 03/21/2021   Procedure: TRANSESOPHAGEAL ECHOCARDIOGRAM (TEE);  Surgeon: Rexene Alberts, MD;  Location: [place];  Service: Open Heart Surgery;  Laterality: N/A;     Social History:   reports that he has been smoking cigarettes. He has been smoking about 0.75 packs per day. He has never used smokeless tobacco. He reports current alcohol use. He reports current drug use. Drug: Marijuana.   Family History:  His family history includes Hypertension in his mother.   Allergies No Known Allergies   Home Medications  Prior to Admission medications   Medication Sig Start Date End Date Taking? Authorizing Provider  acetaminophen (TYLENOL) 325 MG tablet Take 2 tablets (650 mg total) by mouth every 6 (six) hours as needed for mild pain, moderate pain, fever or headache (or Fever >/= 101). 03/11/21   Wyvonnia Dusky, MD  amLODipine (NORVASC) 10 MG tablet Take 1 tablet (10 mg total) by mouth daily. 03/26/21   Barrett, Erin R, PA-C  ampicillin IVPB Inject 12 g into the vein daily. As a continuous infusion. Indication: Endocarditis First Dose: yes Last Day of Therapy: 05/02/21 Labs - Once weekly:  CBC/D and BMP, Labs - Every other week:  ESR and CRP Method of administration: Ambulatory Pump (Continuous Infusion) Method of administration may be changed at the discretion of home infusion pharmacist based upon assessment of the patient and/or caregiver's ability to self-administer the medication ordered. 03/26/21 05/03/21  Barrett, Lodema Hong, PA-C  aspirin EC 325 MG EC tablet Take 1 tablet (325 mg total) by mouth daily. 03/25/21   Barrett, Erin R, PA-C  cefTRIAXone (ROCEPHIN) IVPB Inject 2 g into the vein every 12 (twelve) hours. Indication: Endocarditis First Dose: yes Last  Day of Therapy: 05/02/21 Labs - Once weekly:  CBC/D and BMP, Labs - Every other week:  ESR and CRP Method of administration: IV Push Method of administration may be changed at the discretion of home infusion pharmacist based upon assessment of the patient and/or caregiver's ability to self-administer the medication ordered. 03/26/21 05/03/21  Barrett, Erin R, PA-C  metoprolol tartrate (LOPRESSOR) 100  MG tablet Take 1 tablet (100 mg total) by mouth 2 (two) times daily. 03/26/21   Barrett, Erin R, PA-C  nicotine (NICODERM CQ - DOSED IN MG/24 HOURS) 14 mg/24hr patch Place 1 patch (14 mg total) onto the skin daily. 03/19/21   Sharen Hones, MD  oxyCODONE (OXY IR/ROXICODONE) 5 MG immediate release tablet Take 1 tablet (5 mg total) by mouth every 4 (four) hours as needed for severe pain. 03/26/21   Barrett, Erin R, PA-C  traZODone (DESYREL) 50 MG tablet Take 0.5 tablets (25 mg total) by mouth at bedtime as needed for sleep. 03/19/21   Sharen Hones, MD     Critical care time: 60 minutes

## 2021-03-27 NOTE — Progress Notes (Signed)
Pt was transported from ED to the OR while on the vent.

## 2021-03-27 NOTE — ED Notes (Signed)
CRNA administering 50mg  rocc to pt.

## 2021-03-27 NOTE — Consult Note (Signed)
Neurology Consultation Reason for Consult: IPH Referring Physician: Larinda Buttery, C  CC: Unresponsiveness  History is obtained from: Chart review  HPI: Jerome Mccormick is a 30 y.o. male with a history of recent endocarditis s/p mitral valve repair on April 21.  He was discharged on April 25.  He was with his home health aide this morning and walked into the bathroom and then was taking a long time in the bathroom and she went to go check on him.  She found him down and activated 911.  There was no clear signs of trauma.  Given his history of drug use, there was some concern for this as etiology, but after he was brought to the emergency department and taken for a CT scan there is a large intraparenchymal hematoma with extra-axial extension.  Given his poor exam, neurosurgery was emergently consulted and the decision was made to expedite craniotomy to improve any chance of survival by doing the surgery at [facility].  Given that [place] is not typically set up for close postoperative neurosurgical monitoring, it was decided that he would benefit from a transfer to a tertiary care center with a neuro intensive care unit.   LKW: 10:30 am  ICH Score: 3    ROS: Unable to obtain due to altered mental status.   Past Medical History:  Diagnosis Date  . Allergy   . 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 Headache   . Postinfectious glomerulonephritis   . S/P mitral valve repair 03/21/2021   Complex valvuloplasty including quadrangular resection of infected anterior leaflet with 28 mm Medtronic Sinuform ring annuloplasty     Family History  Problem Relation Age of Onset  . Hypertension Mother      Social History:  reports that he has been smoking cigarettes. He has been smoking about 0.75 packs per day. He has never used smokeless tobacco. He reports current alcohol use. He reports current drug use. Drug: Marijuana.   Exam: Current vital signs: BP 132/75   Pulse (!) 113    Resp (!) 23   SpO2 100%  Vital signs in last 24 hours: Pulse Rate:  [100-118] 113 (04/27 1315) Resp:  [11-23] 23 (04/27 1315) BP: (132-170)/(75-117) 132/75 (04/27 1315) SpO2:  [92 %-100 %] 100 % (04/27 1315) FiO2 (%):  [100 %] 100 % (04/27 1220)   Physical Exam  Constitutional: Appears well-developed and well-nourished.  Psych: unresponsive Eyes: No scleral injection HENT: ET tube in place MSK: no joint deformities.  Cardiovascular: Normal rate and regular rhythm.  Respiratory: ventilated GI: Soft.  No distension. There is no tenderness.  Skin: healing midline incision on chest   Neuro:(rocuronium given 25 minutes prior to exam) Mental Status: Patient is comatose Cranial Nerves: Pupils are fixed and dilated bilaterally, no corneals, no EOM Motor: No movement to noxious stimulation Sensory: As above Deep Tendon Reflexes: Absent   I have reviewed labs in epic and the results pertinent to this consultation are: Creatinine 1.39  I have reviewed the images obtained: CT head- large parietal IPH   Impression: 30 year old male with large intraparenchymal hematoma with extra-axial extension.  Hemorrhagic posterior reversible encephalopathy syndrome(PRES) is certainly a possibility as is hemorrhagic conversion of ischemic infarct.  Mycotic aneurysm from septic embolization is also possible.  In any case, given his poor exam on arrival I am quite concerned that this may not be a survivable injury, but his best chance lies in rapid decompression and he is going for urgent craniotomy.  Recommendations:  1) appreciate neurosurgical assistance 2) blood pressure goal less than 140 systolic 3) avoid anticoagulants antiplatelets 4) hypertonic saline, he was given 23.4% 30 mL and I would follow with 3% for goal of 150-155 5) neurology will see in consultation on arrival to [facility].  Neurosurgery will likely need to be consulted as well.  This patient is critically ill and at  significant risk of neurological worsening, death and care requires constant monitoring of vital signs, hemodynamics,respiratory and cardiac monitoring, neurological assessment, discussion with family, other specialists and medical decision making of high complexity. I spent 55 minutes of neurocritical care time  in the care of  this patient. This was time spent independent of any time provided by nurse practitioner or PA.  Ritta Slot, MD [facility] [phone]  If 7pm- 7am, please page neurology on call as listed in AMION. 03/05/2021  3:12 PM

## 2021-03-27 NOTE — Progress Notes (Signed)
   04-23-2021 1230  Clinical Encounter Type  Visited With Patient and family together  Visit Type Initial;Spiritual support;Social support  Referral From Nurse  Consult/Referral To Neysa Hotter provided support to Pt family. I use the ministry of presence and a listening ear. I escorted Pt family to the OR waiting room. I will follow up later.

## 2021-03-28 ENCOUNTER — Encounter: Payer: Self-pay | Admitting: Pulmonary Disease

## 2021-03-28 ENCOUNTER — Inpatient Hospital Stay: Payer: Medicaid Other

## 2021-03-28 ENCOUNTER — Inpatient Hospital Stay
Admit: 2021-03-28 | Discharge: 2021-03-28 | Disposition: A | Discharge status: Home / Self Care | Payer: Medicaid Other | Attending: Pulmonary Disease | Admitting: Pulmonary Disease

## 2021-03-28 DIAGNOSIS — I616 Nontraumatic intracerebral hemorrhage, multiple localized: Secondary | ICD-10-CM

## 2021-03-28 DIAGNOSIS — Z7189 Other specified counseling: Secondary | ICD-10-CM

## 2021-03-28 DIAGNOSIS — Z515 Encounter for palliative care: Secondary | ICD-10-CM

## 2021-03-28 LAB — GLUCOSE, CAPILLARY
Glucose-Capillary: 121 mg/dL — ABNORMAL HIGH (ref 70–99)
Glucose-Capillary: 110 mg/dL — ABNORMAL HIGH (ref 70–99)
Glucose-Capillary: 86 mg/dL (ref 70–99)
Glucose-Capillary: 106 mg/dL — ABNORMAL HIGH (ref 70–99)
Glucose-Capillary: 112 mg/dL — ABNORMAL HIGH (ref 70–99)
Glucose-Capillary: 95 mg/dL (ref 70–99)

## 2021-03-28 LAB — BASIC METABOLIC PANEL
Anion gap: 9 (ref 5–15)
BUN: 18 mg/dL (ref 6–20)
CO2: 30 mmol/L (ref 22–32)
Calcium: 7.6 mg/dL — ABNORMAL LOW (ref 8.9–10.3)
Chloride: 107 mmol/L (ref 98–111)
Creatinine, Ser: 1.25 mg/dL — ABNORMAL HIGH (ref 0.61–1.24)
GFR, Estimated: >60 mL/min (ref >60)
Glucose, Bld: 124 mg/dL — ABNORMAL HIGH (ref 70–99)
Potassium: 3.8 mmol/L (ref 3.5–5.1)
Sodium: 146 mmol/L — ABNORMAL HIGH (ref 135–145)

## 2021-03-28 LAB — ACID FAST SMEAR (AFB, MYCOBACTERIA): Acid Fast Smear: NEGATIVE

## 2021-03-28 LAB — ECHOCARDIOGRAM COMPLETE BUBBLE STUDY: S' Lateral: 2.71 cm

## 2021-03-28 LAB — CBC
HCT: 21.2 % — ABNORMAL LOW (ref 39.0–52.0)
Hemoglobin: 6.9 g/dL — ABNORMAL LOW (ref 13.0–17.0)
MCH: 29.5 pg (ref 26.0–34.0)
MCHC: 32.5 g/dL (ref 30.0–36.0)
MCV: 90.6 fL (ref 80.0–100.0)
Platelets: 303 10*3/uL (ref 150–400)
RBC: 2.34 MIL/uL — ABNORMAL LOW (ref 4.22–5.81)
RDW: 16.5 % — ABNORMAL HIGH (ref 11.5–15.5)
WBC: 8 10*3/uL (ref 4.0–10.5)
nRBC: 0 % (ref 0.0–0.2)

## 2021-03-28 LAB — HEMOGLOBIN AND HEMATOCRIT, BLOOD
HCT: 22.9 % — ABNORMAL LOW (ref 39.0–52.0)
Hemoglobin: 7.3 g/dL — ABNORMAL LOW (ref 13.0–17.0)

## 2021-03-28 LAB — SODIUM
Sodium: 145 mmol/L (ref 135–145)
Sodium: 151 mmol/L — ABNORMAL HIGH (ref 135–145)
Sodium: 150 mmol/L — ABNORMAL HIGH (ref 135–145)
Sodium: 152 mmol/L — ABNORMAL HIGH (ref 135–145)
Sodium: 170 mmol/L (ref 135–145)

## 2021-03-28 LAB — PREPARE RBC (CROSSMATCH)

## 2021-03-28 LAB — MAGNESIUM: Magnesium: 3.2 mg/dL — ABNORMAL HIGH (ref 1.7–2.4)

## 2021-03-28 LAB — PHOSPHORUS: Phosphorus: 5 mg/dL — ABNORMAL HIGH (ref 2.5–4.6)

## 2021-03-28 MED ORDER — ACETAMINOPHEN 650 MG RE SUPP
650.0000 mg | Freq: Four times a day (QID) | RECTAL | Status: DC | PRN
  Administered 2021-03-28: 650 mg via RECTAL
  Filled 2021-03-28: qty 1

## 2021-03-28 MED ORDER — IOHEXOL 350 MG/ML SOLN
75.0000 mL | Freq: Once | INTRAVENOUS | Status: AC | PRN
  Administered 2021-03-28: 75 mL via INTRAVENOUS

## 2021-03-28 MED ORDER — SODIUM CHLORIDE 0.9% IV SOLUTION: Freq: Once | INTRAVENOUS | Status: AC

## 2021-03-28 MED ORDER — MAGNESIUM SULFATE 2 GM/50ML IV SOLN
2.0000 g | Freq: Once | INTRAVENOUS | Status: AC
  Administered 2021-03-28: 2 g via INTRAVENOUS
  Filled 2021-03-28: qty 50

## 2021-03-28 MED ORDER — ACETAMINOPHEN 325 MG PO TABS
650.0000 mg | ORAL_TABLET | Freq: Four times a day (QID) | ORAL | Status: DC | PRN
  Administered 2021-03-28: 650 mg
  Filled 2021-03-28: qty 2

## 2021-03-28 MED ORDER — MAGNESIUM SULFATE 4 GM/100ML IV SOLN
4.0000 g | Freq: Once | INTRAVENOUS | Status: AC
  Administered 2021-03-28: 4 g via INTRAVENOUS
  Filled 2021-03-28: qty 100

## 2021-03-28 MED ORDER — LORAZEPAM 2 MG/ML IJ SOLN
2.0000 mg | Freq: Once | INTRAMUSCULAR | Status: AC
  Administered 2021-03-28: 2 mg via INTRAVENOUS
  Filled 2021-03-28: qty 1

## 2021-03-28 MED ORDER — PANTOPRAZOLE SODIUM 40 MG IV SOLR
40.0000 mg | Freq: Two times a day (BID) | INTRAVENOUS | Status: DC
  Administered 2021-03-28 – 2021-03-29 (×3): 40 mg via INTRAVENOUS
  Filled 2021-03-28 (×3): qty 40

## 2021-03-28 MED ORDER — LEVETIRACETAM IN NACL 1000 MG/100ML IV SOLN
1000.0000 mg | Freq: Two times a day (BID) | INTRAVENOUS | Status: DC
  Administered 2021-03-28 – 2021-03-29 (×4): 1000 mg via INTRAVENOUS
  Filled 2021-03-28 (×6): qty 100

## 2021-03-28 MED ORDER — AMPICILLIN IV (FOR PTA / DISCHARGE USE ONLY): 12.0000 g | Freq: Every day | INTRAVENOUS | Status: DC

## 2021-03-28 MED ORDER — VITAL AF 1.2 CAL PO LIQD
1000.0000 mL | ORAL | Status: DC
  Administered 2021-03-28: 1000 mL

## 2021-03-28 MED ORDER — PROSOURCE TF PO LIQD
45.0000 mL | Freq: Two times a day (BID) | ORAL | Status: DC
  Administered 2021-03-29: 45 mL
  Filled 2021-03-28: qty 45

## 2021-03-28 MED ORDER — DEXMEDETOMIDINE HCL IN NACL 400 MCG/100ML IV SOLN
0.4000 ug/kg/h | INTRAVENOUS | Status: DC
  Administered 2021-03-28 – 2021-03-29 (×2): 0.4 ug/kg/h via INTRAVENOUS
  Filled 2021-03-28 (×2): qty 100

## 2021-03-28 MED ORDER — FREE WATER
30.0000 mL | Status: DC
  Administered 2021-03-28 – 2021-03-29 (×6): 30 mL

## 2021-03-28 MED ORDER — CHLORHEXIDINE GLUCONATE CLOTH 2 % EX PADS
6.0000 | MEDICATED_PAD | Freq: Every day | CUTANEOUS | Status: DC
  Administered 2021-03-28: 6 via TOPICAL

## 2021-03-28 NOTE — Anesthesia Postprocedure Evaluation (Addendum)
Anesthesia Post Note  Patient: Jerome Mccormick  Procedure(s) Performed: CRANIOTOMY HEMATOMA EVACUATION SUBDURAL (Left )  Patient location during evaluation: ICU Anesthesia Type: General Level of consciousness: sedated Pain management: pain level controlled Vital Signs Assessment: post-procedure vital signs reviewed and stable Respiratory status: patient on ventilator - see flowsheet for VS Cardiovascular status: stable (on nicardipine) Postop Assessment: no apparent nausea or vomiting Anesthetic complications: no   No complications documented.   Last Vitals:  Vitals:   03/28/21 0518 03/28/21 0600  BP:  108/64  Pulse:  (!) 101  Resp:  20  Temp:  37.2 C  SpO2: 100% 100%    Last Pain:  Vitals:   03/28/21 0400  TempSrc: Bladder                 Lynden Oxford

## 2021-03-28 NOTE — Progress Notes (Signed)
PHARMACY CONSULT NOTE - FOLLOW UP  Pharmacy Consult for Electrolyte Monitoring and Replacement   Recent Labs: Potassium (mmol/L)  Date Value  04-04-21 3.4 (L)  03/11/2014 3.7   Magnesium (mg/dL)  Date Value  28/36/6294 1.3 (L)   Calcium (mg/dL)  Date Value  76/54/6503 8.0 (L)   Calcium, Total (mg/dL)  Date Value  54/65/6812 8.6   Albumin (g/dL)  Date Value  75/17/0017 3.2 (L)  03/11/2014 3.7   Phosphorus (mg/dL)  Date Value  49/44/9675 5.2 (H)   Sodium (mmol/L)  Date Value  03/28/2021 145  03/11/2014 142     Assessment: 30yo male with past medical history for postinfectious glomerulonephritis, bacterial endocarditis (on IV antibiotics - ceftriaxone and ampicillin until 05/02/21), s/p mitral valve repair with large intraparenchymal hematoma with extra-axial extension. Pharmacy has been consulted for electrolyte management.   Pt is also on hypertonic saline @75ml /hr for intracerebral hemorrhage status post craniotomy and evacuation  4/27 @1225  Na 138 4/27 @1300  Na 140 4/27 @1714  Na 142 4/27 @1927  Na 143 4/28 @0106  Na 145  Goal of Therapy:  Electrolytes WNL Na 150 mmol/L  Plan:   Na 142 - will notify MD if >4 mEq/L in 2 hours or >6 mEq/L in 4 hours  Re-check electrolytes with AM labs and f/u Na in 6 hours  Alvilda Mckenna D, PharmD Pharmacy Resident  03/28/2021 2:07 AM

## 2021-03-28 NOTE — Progress Notes (Signed)
PHARMACY CONSULT NOTE - FOLLOW UP  Pharmacy Consult for Electrolyte Monitoring and Replacement   Recent Labs: Potassium (mmol/L)  Date Value  03/28/2021 3.8  03/11/2014 3.7   Magnesium (mg/dL)  Date Value  14/97/0263 3.2 (H)   Calcium (mg/dL)  Date Value  78/58/8502 7.6 (L)   Calcium, Total (mg/dL)  Date Value  77/41/2878 8.6   Albumin (g/dL)  Date Value  67/67/2094 3.2 (L)  03/11/2014 3.7   Phosphorus (mg/dL)  Date Value  70/96/2836 5.0 (H)   Sodium (mmol/L)  Date Value  03/28/2021 152 (H)  03/11/2014 142     Assessment: 30yo male with past medical history for postinfectious glomerulonephritis, bacterial endocarditis (on IV antibiotics - ceftriaxone and ampicillin until 05/02/21), s/p mitral valve repair with large intraparenchymal hematoma with extra-axial extension. Pharmacy has been consulted for electrolyte management.   Pt is also on hypertonic saline @75ml /hr for intracerebral hemorrhage status post craniotomy and evacuation  4/27 1225 Na 138 4/27 1300 Na 140 ... 4/28 0458 Na 146 4/28 1125 Na 151 - notified NP, decreased rate to 58ml/hr and changed Na checks to q4h  4/28 1454 Na 150 4/28 1812 Na 152  Goal of Therapy:  Electrolytes WNL Na 150 mmol/L  Plan:   Sodium maintaining around 150. Remains on hypertonic saline at 65 ml/hr.  Next sodium around 2300   Re-check electrolytes with AM labs   5/28, PharmD, BCPS 03/28/2021 7:49 PM

## 2021-03-28 NOTE — Progress Notes (Signed)
PHARMACY CONSULT NOTE - FOLLOW UP  Pharmacy Consult for Electrolyte Monitoring and Replacement   Recent Labs: Potassium (mmol/L)  Date Value  03/28/2021 3.8  03/11/2014 3.7   Magnesium (mg/dL)  Date Value  70/26/3785 3.2 (H)   Calcium (mg/dL)  Date Value  88/50/2774 7.6 (L)   Calcium, Total (mg/dL)  Date Value  12/87/8676 8.6   Albumin (g/dL)  Date Value  72/08/4708 3.2 (L)  03/11/2014 3.7   Phosphorus (mg/dL)  Date Value  62/83/6629 5.0 (H)   Sodium (mmol/L)  Date Value  03/28/2021 146 (H)  03/11/2014 142     Assessment: 30yo male with past medical history for postinfectious glomerulonephritis, bacterial endocarditis (on IV antibiotics - ceftriaxone and ampicillin until 05/02/21), s/p mitral valve repair with large intraparenchymal hematoma with extra-axial extension. Pharmacy has been consulted for electrolyte management.   Pt is also on hypertonic saline @75ml /hr for intracerebral hemorrhage status post craniotomy and evacuation  4/27 @1225  Na 138 4/27 @1300  Na 140 4/27 @1714  Na 142 4/27 @1927  Na 143 4/28 @0106  Na 145 4/28 @0458  Na 146  Goal of Therapy:  Electrolytes WNL Na 150 mmol/L  Plan:   Na 146 - will notify MD if >4 mEq/L in 2 hours or >6 mEq/L in 4 hours or >8 mEq/L in 24 hours  Mg 3.2 - s/p 6g IV Mg sulf   Phos 5.0 - will continue to monitor; if continues to increase could consider phos binder   Re-check electrolytes with AM labs and f/u Na in 6 hours  , PharmD Pharmacy Resident  03/28/2021 6:50 AM

## 2021-03-28 NOTE — Progress Notes (Signed)
PHARMACY CONSULT NOTE - FOLLOW UP  Pharmacy Consult for Electrolyte Monitoring and Replacement   Recent Labs: Potassium (mmol/L)  Date Value  03/28/2021 3.8  03/11/2014 3.7   Magnesium (mg/dL)  Date Value  69/62/9528 3.2 (H)   Calcium (mg/dL)  Date Value  41/32/4401 7.6 (L)   Calcium, Total (mg/dL)  Date Value  02/72/5366 8.6   Albumin (g/dL)  Date Value  44/01/4741 3.2 (L)  03/11/2014 3.7   Phosphorus (mg/dL)  Date Value  59/56/3875 5.0 (H)   Sodium (mmol/L)  Date Value  03/28/2021 151 (H)  03/11/2014 142     Assessment: 30yo male with past medical history for postinfectious glomerulonephritis, bacterial endocarditis (on IV antibiotics - ceftriaxone and ampicillin until 05/02/21), s/p mitral valve repair with large intraparenchymal hematoma with extra-axial extension. Pharmacy has been consulted for electrolyte management.   Pt is also on hypertonic saline @75ml /hr for intracerebral hemorrhage status post craniotomy and evacuation  4/27 @1225  Na 138 4/27 @1300  Na 140 4/27 @1714  Na 142 4/27 @1927  Na 143 4/28 @0106  Na 145 4/28 @0458  Na 146 4/28 @1125  Na 151 - notified NP, decreased rate to 27ml/hr and changed Na checks to q4h   Goal of Therapy:  Electrolytes WNL Na 150 mmol/L  Plan:   4/28 @1125  Na 151 - notified NP, decreased rate to 61ml/hr and changed Na checks to q4h   Re-check electrolytes with AM labs and f/u Na in 4 hours  , PharmD Pharmacy Resident  03/28/2021 1:08 PM

## 2021-03-28 NOTE — Progress Notes (Signed)
    Attending Progress Note  2021/04/12 - L craniotomy for subdural hematoma evacuation  POD1: Patient was stable overnight without any acute exacerbations. He is off sedation for 30 minutes at time of evaluation. He began having tachycardia and myoclonus per the nurse.   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    Physical Exam: Vitals:   03/28/21 0518 03/28/21 0600  BP:  108/64  Pulse:  (!) 101  Resp:  20  Temp:  98.96 F (37.2 C)  SpO2: 100% 100%   Off all sedation No OE, FC, regard L pupul 3 to 2.5, r pupil NR No doll's, corneals, or gag +cough, overbreathing  Extends BLE and LUE, no response RUE  Dressing stable overnight. Drain working - see I/O  Data:  Recent Labs  Lab 04-12-2021 1225 2021/04/12 1300 04/12/21 1714 04/12/2021 1927 03/28/21 0458  NA 138   < > 142   < > 146*  K 3.2*  --  3.1*   < > 3.8  CL 95*  --  101  --  107  CO2 26  --  30  --  30  BUN 23*  --  23*  --  18  CREATININE 1.39*  --  1.23  --  1.25*  GLUCOSE 219*  --  147*  --  124*  CALCIUM 8.8*  --  8.0*  --  7.6*   < > = values in this interval not displayed.   Recent Labs  Lab 04-12-21 1714  AST 36  ALT 74*  ALKPHOS 64     Recent Labs  Lab 2021/04/12 1225 Apr 12, 2021 1714 03/28/21 0458  WBC 16.9* 11.6* 8.0  HGB 10.7* 9.1* 6.9*  HCT 33.0* 28.0* 21.2*  PLT 574* 366 303   Recent Labs  Lab 03/21/21 1346 04/12/21 1225  APTT 31  --   INR 1.5* 1.4*         Other tests/results: none to review  Assessment/Plan:  [name] is doing poorly after presenting in poor neurological condition. I remain concerned that he suffered an irreversibly severe brain injury prior to presentation.  The etiology is unknown.  He was in dire condition at time of presentation, but has not improved after evacuation of his subdural hematoma.  GCS E1V1TM2 = 4T  - continue 3%, sedation as  needed - Head CT today. CTA.  - Will monitor for any improvements. If patient remains in such condition, will discuss with family regarding goals of care. - Cardiac and ventilatory management per ICU  Venetia Night MD, [facility] Department of Neurosurgery

## 2021-03-28 NOTE — Progress Notes (Signed)
I met with several members of the family, including the patient's mother and both sets of grandparents.    We reviewed the events of the last 24-36 hours.  I reviewed that he was critically ill on presentation, with a grim prognosis.  His neurological status was clouded by the presence of medications after rapid sequence intubation.  Because his true status was unknown, he was taken to OR for subdural hematoma evacuation.  Since surgery, I have evaluated the patient off of sedation, and found him to continue at a GCS of 4T.  He has lost multiple cranial nerve reflexes.  Based on lack of improvement with an improved CT scan, I discussed his current prognosis with the family.    I relayed that Mr. Rauch may pass away even with maximal care, but that his most likely outcome in the event of survival is complete dependence.  He would likely require tracheostomy and PEG tube for feeding.  Without continued support, he would likely pass away quickly.    I also relayed that Mr. Johnsey has been comatose since arrival, so is not currently aware of his pain.    I answered as many questions as possible.  The palliative care NP was present during this discussion for support.  I will return in a few hours after they have some time to process the information I have provided.  Meade Maw

## 2021-03-28 NOTE — Progress Notes (Addendum)
Subjective: Unfortunately little improvement. Some low amplitude shaking overnight, started on keppra.   Exam: Vitals:   03/28/21 0800 03/28/21 0900  BP: 104/61 (!) 97/59  Pulse: (!) 114 (!) 112  Resp: 20 20  Temp: 99.86 F (37.7 C) 99.5 F (37.5 C)  SpO2: 100% 100%   Gen: In bed, NAD Resp: non-labored breathing, no acute distress Abd: soft, nt  Neuro: MS: comatose, does not open eyes or follow commands CN: Left pupil 4 > 2, R pupil 3 > 1.5, no response to doll's maneuver, no corneals, cough intact Motor: extension in left arm and leg, flexion in the right.  Sensory:as above DTR: he has non-sustained clonus with even minor manipulations   Pertinent Labs: Na 138(04/27) -> 146(04/28) Cr 1.25  Impression: 30 yo m with devastating ICH. I suspect that the movements overnight were the clonus we are seeing rather than seizure but agree with keppra in any case. He has not had as much improvement after surgery as I would have hoped, and his head CT shows a large IPH with IVH now. He does not have crowding of the brain stem, and I agree with neurosurgery that there would not be much benefit to evacuation of the hematoma. If aggressive care is desired by family, may need to keep a close watch on his ventricles with consideration of EVD if they continue expanding.   Recommendations: 1) continue 3% NS 2) agree with consideration of goals of care conversations 3) continue keppra 1g BID 4) Routine EEG today. 5) will follow.   This patient is critically ill and at significant risk of neurological worsening, death and care requires constant monitoring of vital signs, hemodynamics,respiratory and cardiac monitoring, neurological assessment, discussion with family, other specialists and medical decision making of high complexity. I spent 35 minutes of neurocritical care time  in the care of  this patient. This was time spent independent of any time provided by nurse practitioner or PA.  Ritta Slot, MD [facility] [phone]  If 7pm- 7am, please page neurology on call as listed in AMION. 03/28/2021  11:13 AM

## 2021-03-28 NOTE — Progress Notes (Signed)
Continue to maintain support for a fairly large family presence. Mother is very emotional, grandmother is attempting to be strong for her. Our team is available and accessible for additional support when needed.

## 2021-03-28 NOTE — TOC Initial Note (Addendum)
Transition of Care [facility]) - Initial/Assessment Note    Patient Details  Name: Jerome Mccormick MRN: [id number] Date of Birth: December 25, 1990  Transition of Care [facility]) CM/SW Contact:    Marina Goodell Phone Number: [phone] 03/28/2021, 12:07 PM  Clinical Narrative:                  Patient presents to [facility] after being found unconscious on the bathroom floor.  Patient is four week post-op for mitral valve repair surgery. Patient was emergently intubated and CT scan found a large intraparenchymal hematoma with extra-axial extension w. emergent craniotomy. Patient is currently intubated and sedated.  Palliative consult has been requested to discuss goals of car with Justun, Anaya (Mother) [phone] and grandmother. Patient is active w/ [facility] for RN.  TOC will continue to follow.  Expected Discharge Plan: Home w Home Health Services Barriers to Discharge: Continued Medical Work up   Patient Goals and CMS Choice        Expected Discharge Plan and Services Expected Discharge Plan: Home w Home Health Services In-house Referral: Clinical Social Work   Post Acute Care Choice: Home Health Living arrangements for the past 2 months: Single Family Home                                      Prior Living Arrangements/Services Living arrangements for the past 2 months: Single Family Home Lives with:: Parents Ervine, Witucki (Mother)   [phone]) Patient language and need for interpreter reviewed:: Yes Do you feel safe going back to the place where you live?: Yes      Need for Family Participation in Patient Care: Yes (Comment) Care giver support system in place?: Yes (comment) Current home services: Home RN (social work, [employer]) Criminal Activity/Legal Involvement Pertinent to Current Situation/Hospitalization: No - Comment as needed  Activities of Daily Living Home Assistive Devices/Equipment: Other (Comment) (infuser for ABX) ADL  Screening (condition at time of admission) Patient's cognitive ability adequate to safely complete daily activities?: Yes Is the patient deaf or have difficulty hearing?: No Does the patient have difficulty seeing, even when wearing glasses/contacts?: No Does the patient have difficulty concentrating, remembering, or making decisions?: No Patient able to express need for assistance with ADLs?: Yes Does the patient have difficulty dressing or bathing?: No Independently performs ADLs?: Yes (appropriate for developmental age) Does the patient have difficulty walking or climbing stairs?: No Weakness of Legs: None Weakness of Arms/Hands: None  Permission Sought/Granted Permission sought to share information with : Family Supports    Share Information with NAME: Nevyn, Bossman (Mother)   [phone]           Emotional Assessment Appearance:: Appears stated age Attitude/Demeanor/Rapport: Unable to Assess Affect (typically observed): Unable to Assess   Alcohol / Substance Use: Not Applicable Psych Involvement: No (comment)  Admission diagnosis:  Intracranial hemorrhage (HCC) [I62.9] Chronic endocarditis, unspecified endocarditis type [I38] S/P craniotomy [Z98.890] ICH (intracerebral hemorrhage) (HCC) [I61.9] Patient Active Problem List   Diagnosis Date Noted  . S/P craniotomy 04-01-2021  . ICH (intracerebral hemorrhage) (HCC) Apr 01, 2021  . Post-infectious glomerulonephritis   . 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 S/P MVR (mitral valve repair) 03/21/2021  . Arm DVT (deep venous thromboembolism), acute, right (HCC) 03/19/2021  . Acute diastolic CHF (congestive heart failure) (HCC) 03/16/2021  . History of anemia due to chronic kidney disease 03/16/2021  .  Nicotine dependence 03/16/2021  . Hyperkalemia 03/16/2021  . Acute glomerulonephritis syndrome 03/16/2021  . Mitral valve disease   . Cardiac murmur 03/05/2021  . 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 Nonrheumatic mitral  valve regurgitation   . Anemia 03/03/2021  . AKI (acute kidney injury) (HCC) 03/02/2021   PCP:  Patient, No Pcp Per (Inactive) Pharmacy:   [facility] [address]), [place] - [address] [place]) [place] [address] Phone: [phone] Fax: [phone]     Social Determinants of Health (SDOH) Interventions    Readmission Risk Interventions No flowsheet data found.

## 2021-03-28 NOTE — Progress Notes (Signed)
NAME:  Jerome Mccormick, MRN:  [id number], DOB:  [birth year], LOS: 1 ADMISSION DATE:  03/04/2021, CONSULTATION DATE:  03/14/2021 REFERRING MD:  Cari Caraway, CHIEF COMPLAINT:  ICH   History of Present Illness:  This is a 30 yo male with medical history pertinent for recent diagnosis of enterococcal endocarditis.  Patient was transferred to [facility] for surgical intervention.  Patient underwent mitral valve repair with complex valvuloplasty on 03/21/2021.  Patient's postoperative course was uncomplicated.  Chest tubes were removed on postop day 3.  His OR cultures did grow staph capitis on mitral valve tissue.  Patient was to have antibiotics with ampicillin running continuously with intermittent Rocephin dosage.  Patient was apparently discharged on 03/25/2021.  Patient was with his home health aide who walked him to the bathroom this morning.  He was taking a long time and that she went to go check on him.  He was noticed to have been down at this point.  EMS was called.  EMS transported patient to the emergency department.  In route patient was given Narcan.  With poor response.  Patient was intubated in the ED.  In the ED work-up remarkable for the following:  CMP pertinent for elevated creatinine of 1.39 however this is improved from previous creatinines during most recent admission.  CBC with leukocytosis of 16.9 platelets of 574.  Troponin of 356.  Magnesium of 1.6.    CT head notable for the below: HEAD CT:  Development of a large intraparenchymal hematoma in the left parieto-occipital junction region measuring 6.1 x 3.9 x 5.2 cm (volume 65 cm^3). Acute subdural hematoma along the lateral convexity on the left, maximal thickness 1 cm. Mass effect with left-to-right shift of 1.6 cm. At MRI 1 week ago, there was low level hemorrhage in the location now occupied by the large hematoma. Findings at that time were hypothesized to relate to posterior reversible encephalopathy, but certainly septic  emboli were and are not excluded.    Patient emergently taken back to the OR for Craniotomy and evacuation of subdural hematoma.  Patient then transferred to ICU for stabilization.     03/28/21-  Patient with significant ICH s/p NSGY.  Vitals are stable this am. Had repeat CT head.  Family including mother and grandmother at bedside. I met with neurologist Dr Leonel Ramsay and neurosurgeon Dr Cari Caraway and consensus thus far is prognosis is very poor.  There is plan for family meeting today to review findings and medical plan.  His hb is <7 today.  He had some blood loss intracranially and some intra/post of expected bleeding. He remains on 40% PRVC. We will discuss potential transfusion post goals of care meeting with family.     Pertinent  Medical History  Endocarditis Postinfectious glomerulonephritis DVT   Significant Hospital Events: Including procedures, antibiotic start and stop dates in addition to other pertinent events   . Patient admitted to ICU for stabilization prior to transfer  Interim History / Subjective:  Not applicable  Objective   Blood pressure 108/64, pulse (!) 101, temperature 98.96 F (37.2 C), resp. rate 20, height _0  (1.702 m), weight 74.1 kg, SpO2 100 %.    Vent Mode: PRVC FiO2 (%):  [40 %-100 %] 40 % Set Rate:  [16 bmp-20 bmp] 20 bmp Vt Set:  [500 mL] 500 mL PEEP:  [5 cmH20-8 cmH20] 5 cmH20   Intake/Output Summary (Last 24 hours) at 03/28/2021 0934 Last data filed at 03/28/2021 0800 Gross per 24 hour  Intake 5313.84  ml  Output 5225 ml  Net 88.84 ml   Filed Weights   03/01/2021 1800 03/28/21 0316  Weight: 75.7 kg 74.1 kg    Examination: General: Patient intubated and sedated HENT: ET tube in place Lungs: CTAB. mechanical breath sounds appreciated Cardiovascular: RRR. No rubs or gallups Abdomen: Soft non distended Extremities: trace pitting edema in lower extremities Neuro: Pupils equally round with minima reaction to light. Extremely  sluggish. Spontaneously breathing above rate. No gag. However patient had just received paralytic GU: Foley in place  Labs/imaging that I havepersonally reviewed  (right click and "Reselect all SmartList Selections" daily)  CT head from 03/09/2021 notes  Resolved Hospital Problem list   Not applicable  Assessment & Plan:  This is a 30 year old male with history of infective endocarditis who presents status postrepair with altered mental status.  Found to have intracerebral hemorrhage.   Intracerebral hemorrhage status post craniotomy and evacuation. -Monitor CBCs- Hb <7 on 03/28/21 -s/p goals of care discussion with family currently remains full  Code     Transfuse 2 uints prbc -Goal blood pressure less than 140.  -Continue hypertonic saline with goal of 150 meq -Neuro checks Q 1 hours -Neuro to consult on in AM -Poor prognosis. High likelihood to progress to brain death. Will need palliative on board to help with discussion and preparation.    Encephalopathy secondary to intracerebral hemorrhage and failure to protect airway -supportive care  -nsgy on case appreciate input  Endocarditis- -Continue ampicillin and ceftriaxone per outpatient regimen -TTE in AM  AKI-continue to monitor electrolytes however creatinine currently improved from previous -Strict I's and O's -Avoid contrast when able  Acute blood loss anemia  hb <7 -monitor vitals -transfuse post family meeting if they wish to continue full scope of therapy   Moderate protein calorie malnutrition  - poor prognostic sign  - optimize nourishment while critically ill   - palliative care consultation on case      Best practice (right click and "Reselect all SmartList Selections" daily)  Diet:  NPO Pain/Anxiety/Delirium protocol (if indicated): Yes (RASS goal -2) VAP protocol (if indicated): Yes DVT prophylaxis: Contraindicated GI prophylaxis: H2B Glucose control: CBG q 4hour Central venous access: PICC line in  place Arterial line: Present Foley:  Yes, and it is still needed Mobility:  bed rest  PT consulted: N/A Last date of multidisciplinary goals of care discussion  Code Status:  full code per mother Disposition: ICU  Labs   CBC: Recent Labs  Lab 03/23/21 0320 03/25/21 0411 03/21/2021 1225 03/16/2021 1714 03/28/21 0458  WBC 9.4 8.5 16.9* 11.6* 8.0  NEUTROABS  --   --  11.0*  --   --   HGB 9.3* 8.6* 10.7* 9.1* 6.9*  HCT 28.9* 27.2* 33.0* 28.0* 21.2*  MCV 90.3 91.6 89.9 89.5 90.6  PLT 228 349 574* 366 299    Basic Metabolic Panel: Recent Labs  Lab 03/22/21 0437 03/22/21 1657 03/23/21 0320 03/24/21 0402 03/25/21 0411 03/30/2021 1225 03/28/2021 1300 03/24/2021 1714 03/12/2021 1927 03/28/21 0106 03/28/21 0458  NA 136 135   < > 135 135 138 140 142 143 145 146*  K 4.6 4.3   < > 4.3 4.1 3.2*  --  3.1* 3.4*  --  3.8  CL 104 102   < > 102 102 95*  --  101  --   --  107  CO2 25 25   < > _0 --  30  --   --  30  GLUCOSE 114* 104*   < > 87 93 219*  --  147*  --   --  124*  BUN 30* 28*   < > 24* 21* 23*  --  23*  --   --  18  CREATININE 1.75* 1.68*   < > 1.49* 1.47* 1.39*  --  1.23  --   --  1.25*  CALCIUM 7.9* 8.1*   < > 8.5* 8.4* 8.8*  --  8.0*  --   --  7.6*  MG 2.8* 2.6*  --   --   --  1.6*  --   --  1.3*  --  3.2*  PHOS  --   --   --   --   --   --   --   --  5.2*  --  5.0*   < > = values in this interval not displayed.   GFR: Estimated Creatinine Clearance: 80.8 mL/min (A) (by C-G formula based on SCr of 1.25 mg/dL (H)). Recent Labs  Lab 03/25/21 0411 03/11/2021 1225 03/04/2021 1714 03/17/2021 1927 03/28/21 0458  WBC 8.5 16.9* 11.6*  --  8.0  LATICACIDVEN  --   --  1.3 1.1  --     Liver Function Tests: Recent Labs  Lab 03/11/2021 1225 03/21/2021 1714  AST 45* 36  ALT 89* 74*  ALKPHOS 78 64  BILITOT 0.6 0.6  PROT 7.4 6.6  ALBUMIN 3.4* 3.2*   No results for input(s): LIPASE, AMYLASE in the last 168 hours. No results for input(s): AMMONIA in the last 168 hours.  ABG     Component Value Date/Time   PHART 7.53 (H) 03/28/2021 0514   PCO2ART 38 03/28/2021 0514   PO2ART 155 (H) 03/28/2021 0514   HCO3 31.8 (H) 03/28/2021 0514   TCO2 30 03/21/2021 1757   ACIDBASEDEF 5.9 (H) 03/13/2021 1818   O2SAT 99.5 03/28/2021 0514     Coagulation Profile: Recent Labs  Lab 03/21/21 1346 03/21/2021 1225  INR 1.5* 1.4*    Cardiac Enzymes: No results for input(s): CKTOTAL, CKMB, CKMBINDEX, TROPONINI in the last 168 hours.  HbA1C: Hgb A1c MFr Bld  Date/Time Value Ref Range Status  03/21/2021 03:28 AM 4.9 4.8 - 5.6 % Final    Comment:    (NOTE) Pre diabetes:          5.7%-6.4%  Diabetes:              >6.4%  Glycemic control for   <7.0% adults with diabetes     CBG: Recent Labs  Lab 03/09/2021 1659 03/19/2021 1924 03/20/2021 2315 03/28/21 0315 03/28/21 0743  GLUCAP 140* 113* 100* 121* 110*    Review of Systems:   Unable to attain at this time  Past Medical History:  He,  has a past medical history of Allergy, Bacterial endocarditis (03/05/2021), Headache, Postinfectious glomerulonephritis, and S/P mitral valve repair (03/21/2021).   Surgical History:   Past Surgical History:  Procedure Laterality Date  . MITRAL VALVE REPAIR N/A 03/21/2021   Procedure: MITRAL VALVE REPAIR (MVR) USING MEDTRONIC 28MM SIMUFORM SEMI-RIGID ANNULOPLASTY RING;  Surgeon: Rexene Alberts, MD;  Location: [place];  Service: Open Heart Surgery;  Laterality: N/A;  . TEE WITHOUT CARDIOVERSION N/A 03/06/2021   Procedure: TRANSESOPHAGEAL ECHOCARDIOGRAM (TEE);  Surgeon: Kate Sable, MD;  Location: [facility];  Service: Cardiovascular;  Laterality: N/A;  . TEE WITHOUT CARDIOVERSION N/A 03/21/2021   Procedure: TRANSESOPHAGEAL ECHOCARDIOGRAM (TEE);  Surgeon: Rexene Alberts, MD;  Location: [place];  Service:  Open Heart Surgery;  Laterality: N/A;     Social History:   reports that he has been smoking cigarettes. He has been smoking about 0.75 packs per day. He has never used smokeless  tobacco. He reports current alcohol use. He reports current drug use. Drug: Marijuana.   Family History:  His family history includes Hypertension in his mother.   Allergies No Known Allergies   Home Medications  Prior to Admission medications   Medication Sig Start Date End Date Taking? Authorizing Provider  acetaminophen (TYLENOL) 325 MG tablet Take 2 tablets (650 mg total) by mouth every 6 (six) hours as needed for mild pain, moderate pain, fever or headache (or Fever >/= 101). 03/11/21   Wyvonnia Dusky, MD  amLODipine (NORVASC) 10 MG tablet Take 1 tablet (10 mg total) by mouth daily. 03/26/21   Barrett, Erin R, PA-C  ampicillin IVPB Inject 12 g into the vein daily. As a continuous infusion. Indication: Endocarditis First Dose: yes Last Day of Therapy: 05/02/21 Labs - Once weekly:  CBC/D and BMP, Labs - Every other week:  ESR and CRP Method of administration: Ambulatory Pump (Continuous Infusion) Method of administration may be changed at the discretion of home infusion pharmacist based upon assessment of the patient and/or caregiver's ability to self-administer the medication ordered. 03/26/21 05/03/21  Barrett, Lodema Hong, PA-C  aspirin EC 325 MG EC tablet Take 1 tablet (325 mg total) by mouth daily. 03/25/21   Barrett, Erin R, PA-C  cefTRIAXone (ROCEPHIN) IVPB Inject 2 g into the vein every 12 (twelve) hours. Indication: Endocarditis First Dose: yes Last Day of Therapy: 05/02/21 Labs - Once weekly:  CBC/D and BMP, Labs - Every other week:  ESR and CRP Method of administration: IV Push Method of administration may be changed at the discretion of home infusion pharmacist based upon assessment of the patient and/or caregiver's ability to self-administer the medication ordered. 03/26/21 05/03/21  Barrett, Erin R, PA-C  metoprolol tartrate (LOPRESSOR) 100 MG tablet Take 1 tablet (100 mg total) by mouth 2 (two) times daily. 03/26/21   Barrett, Erin R, PA-C  nicotine (NICODERM CQ - DOSED IN MG/24  HOURS) 14 mg/24hr patch Place 1 patch (14 mg total) onto the skin daily. 03/19/21   Sharen Hones, MD  oxyCODONE (OXY IR/ROXICODONE) 5 MG immediate release tablet Take 1 tablet (5 mg total) by mouth every 4 (four) hours as needed for severe pain. 03/26/21   Barrett, Erin R, PA-C  traZODone (DESYREL) 50 MG tablet Take 0.5 tablets (25 mg total) by mouth at bedtime as needed for sleep. 03/19/21   Sharen Hones, MD     Critical care provider statement:    Critical care time (minutes):  109   Critical care time was exclusive of:  Separately billable procedures and  treating other patients   Critical care was necessary to treat or prevent imminent or  life-threatening deterioration of the following conditions:  acute intracerebral hemmorage, acute blood loss anemia, endocarditis, acutely comatose   Critical care was time spent personally by me on the following  activities:  Development of treatment plan with patient or surrogate,  discussions with consultants, evaluation of patient's response to  treatment, examination of patient, obtaining history from patient or  surrogate, ordering and performing treatments and interventions, ordering  and review of laboratory studies and re-evaluation of patient's condition   I assumed direction of critical care for this patient from another  provider in my specialty: no     Ottie Glazier,  M.D.  Pulmonary & Critical Care Medicine  [facility]

## 2021-03-28 NOTE — Progress Notes (Signed)
*  PRELIMINARY RESULTS* Echocardiogram 2D Echocardiogram has been performed.  Jerome Mccormick 03/28/2021, 11:22 AM

## 2021-03-28 NOTE — Progress Notes (Signed)
Patient on vent. No wean today. Transported to CT without incident.

## 2021-03-28 NOTE — Progress Notes (Signed)
    Attending Progress Note  History: Jerome Mccormick is here for L acute subdural hematoma, brain compression, coma, after recent cardiac surgery for valve replacement due to endocarditis.    Physical Exam: See flowsheets for record at time of visit - 2020-2030 hours. Off all sedation No OE, FC, regard L pupul 5 to 4, r pupil NR No doll's or corneals +cough, overbreathing  Extends BLE and LUE, no response RUE  Dressing shows signs of bleeding - inspected with no sign of active bleeding.  New dressing applied to reinforce.  Data:  Recent Labs  Lab 30-Mar-2021 1225 03/30/21 1300 2021-03-30 1714 03-30-2021 1927 03/28/21 0458  NA 138   < > 142   < > 146*  K 3.2*  --  3.1*   < > 3.8  CL 95*  --  101  --  107  CO2 26  --  30  --  30  BUN 23*  --  23*  --  18  CREATININE 1.39*  --  1.23  --  1.25*  GLUCOSE 219*  --  147*  --  124*  CALCIUM 8.8*  --  8.0*  --  7.6*   < > = values in this interval not displayed.   Recent Labs  Lab 30-Mar-2021 1714  AST 36  ALT 74*  ALKPHOS 64     Recent Labs  Lab 03-30-21 1225 03/30/21 1714 03/28/21 0458  WBC 16.9* 11.6* 8.0  HGB 10.7* 9.1* 6.9*  HCT 33.0* 28.0* 21.2*  PLT 574* 366 303   Recent Labs  Lab 03/21/21 1346 Mar 30, 2021 1225  APTT 31  --   INR 1.5* 1.4*         Other tests/results: none to review  Assessment/Plan:  Jerome Mccormick is doing poorly after presenting in poor neurological condition.  GCS E1V1TM2 = 4T  - continue 3%, sedation as needed - Will monitor for any improvements. If patient remains in such condition, will discuss with family regarding goals of care. - Cardiac and ventilatory management per ICU  Venetia Night MD, [facility] Department of Neurosurgery

## 2021-03-28 NOTE — Consult Note (Signed)
Consultation Note Date: 03/28/2021   Patient Name: Jerome Mccormick  DOB: 1991/11/07  MRN: [id number]  Age / Sex: 30 y.o., male  PCP: Patient, No Pcp Per (Inactive) Referring Physician: Tyna Jaksch, MD  Reason for Consultation: Establishing goals of care  HPI/Patient Profile: 30 y.o. 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 Patient required intubation in ED. Patient emergently taken back to the OR for craniotomy and evacuation of subdural hematoma. When sedation is stopped patient has tachycardia and myoclonus. There is concern he has suffered an irreversibly severe brain injury prior to presentation. PMT consulted to discuss [place].  Clinical Assessment and Goals of Care: I have reviewed medical records including EPIC notes, labs and imaging, received report from RN, assessed the patient and then met with patient's mother, grandparents, cousins, and uncle  to discuss diagnosis prognosis, GOC, EOL wishes, disposition and options.  I introduced Palliative Medicine as specialized medical care for people living with serious illness. It focuses on providing relief from the symptoms and stress of a serious illness. The goal is to improve quality of life for both the patient and the family.  We discussed a brief life review of the patient. Family shares patient was doing well prior to his heart surgery - function not limited by any health complications. 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   We discussed what family understood of current situation - they understood he had a brain bleed. They understood he did not tolerate wean from sedation. We discussed neurosurgeon was coming to bedside soon to provide further  discussion of situation.   Dr. Glean Salvo arrived and I joined family at bedside. He thoroughly explained situation and answered all questions. He discussed that Zacheriah is at high risk of death and if he did survive would likely require total care, nursing facility care - trach/peg. Family expressed understanding. Family understands [place] will not return to baseline.    Emotional support provided to mother - she was distraught, unable to discuss [place] further at this time.   She did share throughout conversation with Dr. Izora Ribas and separately to me that she would "not put my baby through that" - referring to living in a nursing home/trach/peg.   Grandfather did ask for further clarification of true options and we briefly discussed continued aggressive path which would include trach/peg/SNF stay vs transition to comfort measures and freeing [place] from medical equipment. Other family members present for this conversation and express understanding of options.   Discussed a plan with family to allow time for them to process information they have been given today. Discussed we would follow up tomorrow regarding ongoing care and they were agreeable.   Code status not addressed as mother was unable to process decisions.    Questions and concerns were addressed. The family was encouraged to call with questions or concerns.   Primary Decision Maker NEXT OF KIN - mother - Chisom Aust   SUMMARY OF RECOMMENDATIONS   - ongoing [place] discussions - to f/u in AM - mother seems to indicate she would not want to move forward with aggressive interventions but asks for time to process information  Code Status/Advance Care Planning:  Full code     Primary Diagnoses: Present on Admission: . ICH (intracerebral hemorrhage) ([place])   I have reviewed the  medical record, interviewed the patient and family, and examined the patient. The following aspects are pertinent.  Past Medical History:  Diagnosis Date  .  Allergy   . 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 Headache   . Postinfectious glomerulonephritis   . 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 Marital status: Single    Spouse name: Not on file  . Number of children: 0  . Years of education: 33  . Highest education level: Not on file  Occupational History  . Occupation: Restaurant work  Tobacco Use  . Smoking status: Current Every Day Smoker    Packs/day: 0.75    Types: Cigarettes  . Smokeless tobacco: Never Used  Substance and Sexual Activity  . Alcohol use: Yes    Comment: >100 oz per week - beer  . Drug use: Yes    Types: Marijuana  . Sexual activity: Not on file  Other Topics Concern  . Not on file  Social History Narrative   Callie grew up in [place], [place]. Currently unemployed. He enjoys fixing things around the house. He also enjoys hanging out with friends.   Social Determinants of Health   Financial Resource Strain: Not on file  Food Insecurity: Not on file  Transportation Needs: Not on file  Physical Activity: Not on file  Stress: Not on file  Social Connections: Not on file   Family History  Problem Relation Age of Onset  . Hypertension Mother    Scheduled Meds: . sodium chloride   Intravenous Once  . chlorhexidine gluconate (MEDLINE KIT)  15 mL Mouth Rinse BID  . Chlorhexidine Gluconate Cloth  6 each Topical Daily  . [START ON 04-21-21] feeding supplement (PROSource TF)  45 mL Per Tube BID  . free water  30 mL Per Tube Q4H  . mouth rinse  15 mL Mouth Rinse 10 times per day  . pantoprazole (PROTONIX) IV  40 mg Intravenous Q12H   Continuous Infusions: . cefTRIAXone (ROCEPHIN)  IV 2 g (03/28/21 1043)  . feeding supplement (VITAL AF 1.2 CAL)    . levETIRAcetam Stopped (03/28/21 0136)  . niCARDipine 5 mg/hr  (03/28/21 0900)  . propofol (DIPRIVAN) infusion 55 mcg/kg/min (03/28/21 0946)  . sodium chloride (hypertonic) 75 mL/hr at 03/28/21 0952  . sodium chloride 0.9 % 500 mL with ampicillin (OMNIPEN) 12 g infusion 20.8 mL/hr at 03/28/21 0900   PRN Meds:.acetaminophen, docusate sodium, polyethylene glycol No Known Allergies Review of Systems  Unable to perform ROS: Intubated    Physical Exam Constitutional:      Comments: Unresponsive, sedated  Pulmonary:     Comments: Intubated Skin:    General: Skin is warm and dry.     Vital Signs: BP (!) 97/59   Pulse (!) 112   Temp 99.5 F (37.5 C) (Bladder)   Resp 20   Ht '5\' 7"'  (1.702 m)   Wt 74.1 kg   SpO2 100%   BMI 25.59 kg/m  Pain Scale: CPOT       SpO2: SpO2: 100 % O2 Device:SpO2: 100 % O2 Flow Rate: .   IO: Intake/output summary:   Intake/Output Summary (Last 24 hours) at 03/28/2021 1200 Last data filed at 03/28/2021 1100 Gross per 24 hour  Intake 5484.85 ml  Output 5540 ml  Net -55.15 ml    LBM: Last BM Date: (  P)  (4/24, last documented BM) Baseline Weight: Weight: 75.7 kg Most recent weight: Weight: 74.1 kg     Palliative Assessment/Data: PPS 10%    Time Total: 80 minutes Greater than 50%  of this time was spent counseling and coordinating care related to the above assessment and plan.  Juel Burrow, DNP, AGNP-C Palliative Medicine Team [phone] Pager: [phone]

## 2021-03-28 NOTE — Progress Notes (Signed)
Initial Nutrition Assessment  DOCUMENTATION CODES:   Not applicable  INTERVENTION:   Vital AF 1.2@55ml /hr- Initiate at 69ml/hr and advance by 64ml/hr q 8 hours until goal rate is reached.   Pro-Source 39ml BID via tube, provides 40kcal and 11g of protein per serving   Free water flushes 75ml q4 hours to maintain tube patency   Propofol: 26.3 ml/hr- provides 694kcal/day   Regimen provides 2358kcal/day, 121g/day protein and 1258ml/day free water.   Pt at high refeed risk; recommend monitor potassium, magnesium and phosphorus labs daily until stable  NUTRITION DIAGNOSIS:   Inadequate oral intake related to inability to eat (pt sedated and ventilated) as evidenced by NPO status.  GOAL:   Provide needs based on ASPEN/SCCM guidelines  MONITOR:   Vent status,Labs,Weight trends,TF tolerance,Skin,I & O's  REASON FOR ASSESSMENT:   Ventilator    ASSESSMENT:   30 y.o. male with past medical history of substance abuse, endocarditis status post mitral valve repair 4/21, recurrent bacteremia, upper extremity DVT and postinfectious glomerulonephritis presents to the ED for altered mental status and was found to have L acute subdural hematoma now s/p left craniotomy 4/27  Pt sedated and ventilated. OGT in place. Plan is to initiate tube feeds today. Pt is likely at refeed risk. There is no recent documented weight history in chart to determine if any significant weight changes.   Medications reviewed and include: protonix, ceftriaxone, propofol, hypertonic saline, omnipen  Labs reviewed: Na 146(H), creat 1.25(H), P 5.0(H), Mg 3.2(H) Hgb 6.9(L), Hct 21.2(L)  Patient is currently intubated on ventilator support MV: 10.0 L/min Temp (24hrs), Avg:97.7 F (36.5 C), Min:94.28 F (34.6 C), Max:101.12 F (38.4 C)  Propofol: 26.3 ml/hr- provides 694kcal/day   MAP- >88mmHg   UOP- 4440ml/day   NUTRITION - FOCUSED PHYSICAL EXAM:  Flowsheet Row Most Recent Value  Orbital Region No  depletion  Upper Arm Region Mild depletion  Thoracic and Lumbar Region No depletion  Buccal Region No depletion  Temple Region Unable to assess  Clavicle Bone Region Mild depletion  Clavicle and Acromion Bone Region Mild depletion  Scapular Bone Region No depletion  Dorsal Hand No depletion  Patellar Region Mild depletion  Anterior Thigh Region No depletion  Posterior Calf Region No depletion  Edema (RD Assessment) Moderate  Hair Reviewed  Eyes Reviewed  Mouth Reviewed  Skin Reviewed  Nails Reviewed     Diet Order:   Diet Order            Diet NPO time specified  Diet effective now                EDUCATION NEEDS:   No education needs have been identified at this time  Skin:  Skin Assessment: Reviewed RN Assessment (incison head and chest)  Last BM:  pta  Height:   Ht Readings from Last 1 Encounters:  03/12/2021 5\' 7"  (1.702 m)    Weight:   Wt Readings from Last 1 Encounters:  03/28/21 74.1 kg    Ideal Body Weight:  67.27 kg  BMI:  Body mass index is 25.59 kg/m.  Estimated Nutritional Needs:   Kcal:  2105kcal/day  Protein:  105-120g/day  Fluid:  2.0-2.3L/day  03/30/21 MS, RD, LDN Please refer to [facility] for RD and/or RD on-call/weekend/after hours pager

## 2021-03-28 NOTE — Progress Notes (Addendum)
Overnight Events:  Pt noted to have jerking/shaking movements along with spike in temp 38.4 C, sbp 146, and hr 120's following sedation vacation per neurosurgeon request (Dr. Marcell Barlow present at bedside during jerking/shaking movements).  Despite restarting propofol gtt (per neurosurgeon Dr. Osborne Oman request) pt continued to have mild jerking/shaking movements along with abnormal vital signs.  Therefore, due to concern of possible seizure activity along with fevers initiated the following: 650 mg tylenol prn for fever; scheduled iv keppra 1000 mg bid; and 2 mg iv ativan x 1 dose.  Following interventions vital signs stabilized and jerking/shaking movements subsided.  Updated pts mother Hilario Robarts this morning regarding pt condition.  Will continue to monitor and assess pt  Sonda Rumble, [facility]  Pulmonary/Critical Care Pager [phone] (please enter 7 digits) PCCM Consult Pager [phone] (please enter 7 digits)

## 2021-03-28 NOTE — Progress Notes (Addendum)
PHARMACY CONSULT NOTE - FOLLOW UP  Pharmacy Consult for Electrolyte Monitoring and Replacement   Recent Labs: Potassium (mmol/L)  Date Value  03/28/2021 3.8  03/11/2014 3.7   Magnesium (mg/dL)  Date Value  14/23/9532 3.2 (H)   Calcium (mg/dL)  Date Value  02/33/4356 7.6 (L)   Calcium, Total (mg/dL)  Date Value  86/16/8372 8.6   Albumin (g/dL)  Date Value  90/21/1155 3.2 (L)  03/11/2014 3.7   Phosphorus (mg/dL)  Date Value  20/80/2233 5.0 (H)   Sodium (mmol/L)  Date Value  03/28/2021 150 (H)  03/11/2014 142     Assessment: 30yo male with past medical history for postinfectious glomerulonephritis, bacterial endocarditis (on IV antibiotics - ceftriaxone and ampicillin until 05/02/21), s/p mitral valve repair with large intraparenchymal hematoma with extra-axial extension. Pharmacy has been consulted for electrolyte management.   Pt is also on hypertonic saline @75ml /hr for intracerebral hemorrhage status post craniotomy and evacuation  4/27 1225 Na 138 4/27 1300 Na 140 ... 4/28 0458 Na 146 4/28 1125 Na 151 - notified NP, decreased rate to 62ml/hr and changed Na checks to q4h  4/28 1454 Na 150  Goal of Therapy:  Electrolytes WNL Na 150 mmol/L  Plan:   Sodium maintaining around 150. Remains on hypertonic saline at 65 ml/hr.  Next sodium around 2300   Re-check electrolytes with AM labs   5/28, PharmD Clinical Pharmacist 03/28/2021 3:43 PM

## 2021-03-29 ENCOUNTER — Ambulatory Visit: Payer: Self-pay | Admitting: Physician Assistant

## 2021-03-29 ENCOUNTER — Encounter: Payer: Self-pay | Admitting: Neurosurgery

## 2021-03-29 DIAGNOSIS — G9340 Encephalopathy, unspecified: Secondary | ICD-10-CM

## 2021-03-29 DIAGNOSIS — J9600 Acute respiratory failure, unspecified whether with hypoxia or hypercapnia: Secondary | ICD-10-CM

## 2021-03-29 DIAGNOSIS — Z66 Do not resuscitate: Secondary | ICD-10-CM

## 2021-03-29 DIAGNOSIS — I38 Endocarditis, valve unspecified: Secondary | ICD-10-CM

## 2021-03-29 DIAGNOSIS — D62 Acute posthemorrhagic anemia: Secondary | ICD-10-CM

## 2021-03-29 DIAGNOSIS — G253 Myoclonus: Secondary | ICD-10-CM

## 2021-03-29 LAB — TYPE AND SCREEN
ABO/RH(D): A POS
Antibody Screen: NEGATIVE
Unit division: 0
Unit division: 0

## 2021-03-29 LAB — BPAM RBC
Blood Product Expiration Date: 202205222359
Blood Product Expiration Date: 202205282359
ISSUE DATE / TIME: 202204281426
ISSUE DATE / TIME: 202204281735
Unit Type and Rh: 6200
Unit Type and Rh: 6200

## 2021-03-29 LAB — BASIC METABOLIC PANEL
Anion gap: 6 (ref 5–15)
BUN: 19 mg/dL (ref 6–20)
CO2: 26 mmol/L (ref 22–32)
Calcium: 7.7 mg/dL — ABNORMAL LOW (ref 8.9–10.3)
Chloride: 125 mmol/L — ABNORMAL HIGH (ref 98–111)
Creatinine, Ser: 1.27 mg/dL — ABNORMAL HIGH (ref 0.61–1.24)
GFR, Estimated: >60 mL/min (ref >60)
Glucose, Bld: 112 mg/dL — ABNORMAL HIGH (ref 70–99)
Potassium: 3.4 mmol/L — ABNORMAL LOW (ref 3.5–5.1)
Sodium: 157 mmol/L — ABNORMAL HIGH (ref 135–145)

## 2021-03-29 LAB — SODIUM
Sodium: 155 mmol/L — ABNORMAL HIGH (ref 135–145)
Sodium: 156 mmol/L — ABNORMAL HIGH (ref 135–145)
Sodium: 157 mmol/L — ABNORMAL HIGH (ref 135–145)

## 2021-03-29 LAB — CBC
HCT: 23 % — ABNORMAL LOW (ref 39.0–52.0)
Hemoglobin: 7.2 g/dL — ABNORMAL LOW (ref 13.0–17.0)
MCH: 28.8 pg (ref 26.0–34.0)
MCHC: 31.3 g/dL (ref 30.0–36.0)
MCV: 92 fL (ref 80.0–100.0)
Platelets: 264 10*3/uL (ref 150–400)
RBC: 2.5 MIL/uL — ABNORMAL LOW (ref 4.22–5.81)
RDW: 16.2 % — ABNORMAL HIGH (ref 11.5–15.5)
WBC: 7.6 10*3/uL (ref 4.0–10.5)
nRBC: 0 % (ref 0.0–0.2)

## 2021-03-29 LAB — MAGNESIUM: Magnesium: 2.8 mg/dL — ABNORMAL HIGH (ref 1.7–2.4)

## 2021-03-29 LAB — GLUCOSE, CAPILLARY
Glucose-Capillary: 97 mg/dL (ref 70–99)
Glucose-Capillary: 114 mg/dL — ABNORMAL HIGH (ref 70–99)

## 2021-03-29 LAB — PHOSPHORUS: Phosphorus: 4.1 mg/dL (ref 2.5–4.6)

## 2021-03-29 MED ORDER — MORPHINE BOLUS VIA INFUSION
5.0000 mg | INTRAVENOUS | Status: DC | PRN
Start: 2021-03-29 — End: 2021-03-30
  Filled 2021-03-29: qty 5

## 2021-03-29 MED ORDER — POTASSIUM CHLORIDE 10 MEQ/50ML IV SOLN
10.0000 meq | INTRAVENOUS | Status: AC
  Administered 2021-03-29 (×4): 10 meq via INTRAVENOUS
  Filled 2021-03-29 (×4): qty 50

## 2021-03-29 MED ORDER — LORAZEPAM BOLUS VIA INFUSION
2.0000 mg | INTRAVENOUS | Status: DC | PRN
  Filled 2021-03-29: qty 2

## 2021-03-29 MED ORDER — MORPHINE BOLUS VIA INFUSION
5.0000 mg | INTRAVENOUS | Status: DC | PRN
  Filled 2021-03-29: qty 5

## 2021-03-29 MED ORDER — POLYVINYL ALCOHOL 1.4 % OP SOLN
1.0000 [drp] | Freq: Four times a day (QID) | OPHTHALMIC | Status: DC | PRN
  Filled 2021-03-29: qty 15

## 2021-03-29 MED ORDER — BIOTENE DRY MOUTH MT LIQD: 15.0000 mL | OROMUCOSAL | Status: DC | PRN

## 2021-03-29 MED ORDER — MORPHINE 100MG IN NS 100ML (1MG/ML) PREMIX INFUSION
2.0000 mg/h | INTRAVENOUS | Status: DC
Start: 2021-03-29 — End: 2021-03-30
  Administered 2021-03-29: 2 mg/h via INTRAVENOUS
  Administered 2021-03-29: 20 mg/h via INTRAVENOUS
  Filled 2021-03-29 (×2): qty 100

## 2021-03-29 MED ORDER — GLYCOPYRROLATE 0.2 MG/ML IJ SOLN
0.2000 mg | INTRAMUSCULAR | Status: DC | PRN
  Administered 2021-03-29: 0.2 mg via INTRAVENOUS
  Filled 2021-03-29: qty 1

## 2021-03-29 MED ORDER — GLYCOPYRROLATE 1 MG PO TABS
1.0000 mg | ORAL_TABLET | ORAL | Status: DC | PRN
  Filled 2021-03-29: qty 1

## 2021-03-29 MED ORDER — LORAZEPAM BOLUS VIA INFUSION
1.0000 mg | INTRAVENOUS | Status: DC | PRN
  Filled 2021-03-29: qty 1

## 2021-03-29 MED ORDER — LORAZEPAM 2 MG/ML IJ SOLN
0.5000 mg/h | INTRAVENOUS | Status: DC
  Administered 2021-03-29: 0.5 mg/h via INTRAVENOUS
  Administered 2021-03-29: 10 mg/h via INTRAVENOUS
  Filled 2021-03-29 (×3): qty 25

## 2021-03-29 MED ORDER — HALOPERIDOL LACTATE 5 MG/ML IJ SOLN: 0.5000 mg | INTRAMUSCULAR | Status: DC | PRN

## 2021-03-29 MED ORDER — MORPHINE BOLUS VIA INFUSION
2.0000 mg | INTRAVENOUS | Status: DC | PRN
  Filled 2021-03-29: qty 2

## 2021-03-29 MED ORDER — GLYCOPYRROLATE 0.2 MG/ML IJ SOLN: 0.2000 mg | INTRAMUSCULAR | Status: DC | PRN

## 2021-03-29 MED ORDER — HALOPERIDOL LACTATE 2 MG/ML PO CONC
0.5000 mg | ORAL | Status: DC | PRN
  Filled 2021-03-29: qty 0.3

## 2021-03-29 MED ORDER — ONDANSETRON HCL 4 MG/2ML IJ SOLN: 4.0000 mg | Freq: Four times a day (QID) | INTRAMUSCULAR | Status: DC | PRN

## 2021-03-29 MED ORDER — HALOPERIDOL 0.5 MG PO TABS
0.5000 mg | ORAL_TABLET | ORAL | Status: DC | PRN
  Filled 2021-03-29: qty 1

## 2021-03-29 MED ORDER — GLYCOPYRROLATE 0.2 MG/ML IJ SOLN
0.2000 mg | INTRAMUSCULAR | Status: AC
  Administered 2021-03-29: 0.2 mg via INTRAVENOUS
  Filled 2021-03-29: qty 1

## 2021-03-29 MED ORDER — ONDANSETRON 4 MG PO TBDP
4.0000 mg | ORAL_TABLET | Freq: Four times a day (QID) | ORAL | Status: DC | PRN
  Filled 2021-03-29: qty 1

## 2021-03-31 NOTE — Progress Notes (Signed)
PHARMACY CONSULT NOTE - FOLLOW UP  Pharmacy Consult for Electrolyte Monitoring and Replacement   Recent Labs: Potassium (mmol/L)  Date Value  04/09/21 3.4 (L)  03/11/2014 3.7   Magnesium (mg/dL)  Date Value  04/54/0981 2.8 (H)   Calcium (mg/dL)  Date Value  19/14/7829 7.7 (L)   Calcium, Total (mg/dL)  Date Value  56/21/3086 8.6   Albumin (g/dL)  Date Value  57/84/6962 3.2 (L)  03/11/2014 3.7   Phosphorus (mg/dL)  Date Value  95/28/4132 4.1   Sodium (mmol/L)  Date Value  09-Apr-2021 156 (H)  04/09/21 157 (H)  03/11/2014 142     Assessment: 30yo male with past medical history for postinfectious glomerulonephritis, bacterial endocarditis (on IV antibiotics - ceftriaxone and ampicillin until 05/02/21), s/p mitral valve repair with large intraparenchymal hematoma with extra-axial extension. Pharmacy has been consulted for electrolyte management.   Pt is also on hypertonic saline @75ml /hr for intracerebral hemorrhage status post craniotomy and evacuation  4/27 1225 Na 138 4/27 1300 Na 140 ... 4/28 0458 Na 146 4/28 1125 Na 151 - notified NP, decreased rate to 21ml/hr and changed Na checks to q4h  4/28 1454 Na 150 4/28 1812 Na 152 4/28 2301 Na 170 - believed to be lab error, ordered repeat Na STAT 4/29 0008 Na 155 - consistent with levels prior to 2300, therefore 2301 level was lab error 4/29 0353 Na 157  Goal of Therapy:  Electrolytes WNL Na 150 mmol/L  Plan:  Hypertonic saline decreased to 40 ml/hr.  Will recheck Na in 4 hrs @ 0800.   Lillie Portner D, PharmD 09-Apr-2021 4:30 AM

## 2021-03-31 NOTE — Progress Notes (Signed)
    Attending Progress Note  03/15/2021 - L craniotomy for subdural hematoma evacuation  POD2: Patient was stable overnight without any acute exacerbations. We held a family meeting yesterday as documented.  He continues to be on sedation.  POD1: Patient was stable overnight without any acute exacerbations. He is off sedation for 30 minutes at time of evaluation. He began having tachycardia and myoclonus per the nurse.   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    Physical Exam: Vitals:   04-01-21 0548 04/01/2021 0600  BP:  109/61  Pulse:  77  Resp:  20  Temp:  98.06 F (36.7 C)  SpO2: 100% 100%   Off all sedation No OE, FC, regard L pupil 2 MR, r pupil NR No doll's, corneals, or gag +overbreathing  Extends BLE and LUE, no response RUE  Dressing stable overnight. Drain working - see I/O  Data:  Recent Labs  Lab 03/19/2021 1714 03/21/2021 1927 03/28/21 0458 03/28/21 1125 04/01/2021 0353  NA 142   < > 146*   < > 157*  156*  K 3.1*   < > 3.8  --  3.4*  CL 101  --  107  --  125*  CO2 30  --  30  --  26  BUN 23*  --  18  --  19  CREATININE 1.23  --  1.25*  --  1.27*  GLUCOSE 147*  --  124*  --  112*  CALCIUM 8.0*  --  7.6*  --  7.7*   < > = values in this interval not displayed.   Recent Labs  Lab 03/16/2021 1714  AST 36  ALT 74*  ALKPHOS 64     Recent Labs  Lab 03/25/2021 1714 03/28/21 0458 03/28/21 2041 04-01-2021 0353  WBC 11.6* 8.0  --  7.6  HGB 9.1* 6.9*   < > 7.2*  HCT 28.0* 21.2*   < > 23.0*  PLT 366 303  --  264   < > = values in this interval not displayed.   Recent Labs  Lab 03/17/2021 1225  INR 1.4*         Other tests/results: none to review  Assessment/Plan:  Jerome Mccormick is doing poorly after presenting in poor neurological condition. I have advised the family that he is extremely unlikely to improve.  Family is likely to  change goals of care today, which I think is reasonable and appropriate.   GCS E1V1TM2 = 4T  - continue 3%, sedation as needed - Consider transition of GOC today - Cardiac and ventilatory management per ICU  Venetia Night MD, [facility] Department of Neurosurgery

## 2021-03-31 NOTE — Death Summary Note (Signed)
DEATH SUMMARY   Patient Details  Name: Jerome Mccormick MRN: [id number] DOB: 1991/06/22  Admission/Discharge Information   Admit Date:  04/06/2021  Date of Death: Date of Death: 04/08/21  Time of Death: Time of Death: 04-17-1916  Length of Stay: 2  Referring Physician: Patient, No Pcp Per (Inactive)   Reason(s) for Hospitalization  Large left subdural hematoma and underlying intraparenchymal hemorrhage requiring emergent craniotomy.  Diagnoses  Preliminary cause of death:  Secondary Diagnoses (including complications and co-morbidities):  Active Problems:   AKI (acute kidney injury) (HCC)   S/P craniotomy   ICH (intracerebral hemorrhage) (HCC)   Acute blood loss anemia   Encephalopathy acute   Endocarditis   Acute respiratory failure [facility])   Brief Hospital Course (including significant findings, care, treatment, and services provided and events leading to death)  Jerome Mccormick is a 30 y.o. 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  Patient's postoperative course was uncomplicated and chest tubes were removed on postop day 3.  His OR cultures did grow staph capitis on mitral valve tissue and the patient was to have antibiotics with ampicillin running continuously with intermittent Rocephin dosage.  Patient was discharged from the hospital on 03/25/2021.  On 04/06/21 the patient was in his own home and home health aide walked him to the bathroom that morning.  They noticed the patient was taking a long time in the bathroom and upon checking up on him he was found unresponsive and EMS was called.  The patient remained unresponsive and was intubated upon arrival to [facility] ED. head CT notable for a large left subdural hematoma and underlying intraparenchymal hemorrhage causing a mass-effect.  Patient underwent craniotomy and evacuation on 06-Apr-2021.  Post procedure patient  continued on 3% hypertonic saline and remained intubated requiring mechanical ventilation due to altered mental status and inability to protect airway.  Concerns for seizure activity when initially taken off of sedation treated with Keppra. Patient continues to have poor neurological status and family consulted with Neurosurgery, Neurology, PCCM & palliative care. Decision made to withdraw care on 2021-04-08. Patient passed with family present at bedside.  Pertinent Labs and Studies  Significant Diagnostic Studies CT ABDOMEN PELVIS WO CONTRAST  Result Date: 03/19/2021 CLINICAL DATA:  Abdominal pain. Renal failure. Lower extremity swelling. EXAM: CT ABDOMEN AND PELVIS WITHOUT CONTRAST TECHNIQUE: Multidetector CT imaging of the abdomen and pelvis was performed following the standard protocol without IV contrast. COMPARISON:  CT scan 03/06/2021 FINDINGS: Lower chest: Small bilateral pleural effusions and bibasilar atelectasis. The heart is normal in size. No pericardial effusion. Hepatobiliary: No hepatic lesions are identified without contrast. No intrahepatic biliary dilatation. The gallbladder is grossly normal. Pancreas: No mass, inflammation or ductal dilatation. Spleen: The spleen is mildly enlarged measuring 14.5 x 13.0 x 9.9 cm. Small low attenuations splenic lesions appears stable. Adrenals/Urinary Tract: Adrenal glands and kidneys are unremarkable. No renal calculi or hydronephrosis. Bladder is unremarkable. Stomach/Bowel: The stomach, duodenum, small bowel and colon are unremarkable. No acute inflammatory changes, mass lesions or obstructive findings. The terminal ileum is normal. The appendix is normal. Vascular/Lymphatic: The aorta is normal in caliber. No atheroscerlotic calcifications. No mesenteric of retroperitoneal mass or adenopathy. Small scattered lymph nodes are noted. Reproductive: The prostate gland and seminal vesicles are unremarkable. Other: Diffuse mesenteric edema without overt ascites.  There is also severe diffuse body wall edema suggesting anasarca. Musculoskeletal: No significant bony findings. 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 IMPRESSION: 1. Small bilateral pleural effusions and bibasilar atelectasis. 2. Severe diffuse body wall edema suggesting anasarca. 3. Mild splenomegaly. 4. Stable small low attenuation splenic lesions. 5. No acute abdominal/pelvic findings, mass lesions or adenopathy. Electronically Signed   By: Rudie Meyer M.D.   On: 03/19/2021 13:58   CT ABDOMEN PELVIS WO CONTRAST  Result Date: 03/06/2021 CLINICAL DATA:  Chronic diarrhea and weight loss. Left-sided abdominal pain for 1 week. Poor kidney function. EXAM: CT ABDOMEN AND PELVIS WITHOUT CONTRAST TECHNIQUE: Multidetector CT imaging of the abdomen and pelvis was performed following the standard protocol without IV contrast. COMPARISON:  03/02/2021 FINDINGS: Lower chest: The lung bases are clear. The heart size is normal. The intracardiac blood pool is hypodense relative to the adjacent myocardium consistent with anemia. Hepatobiliary: The liver is normal. Normal gallbladder.There is no biliary ductal dilation. Pancreas: Normal contours without ductal dilatation. No peripancreatic fluid collection. Spleen: There is a stable hypodense area in the splenic parenchyma towards the hilum. Adrenals/Urinary Tract: --Adrenal glands: Unremarkable. --Right kidney/ureter: No hydronephrosis or radiopaque kidney stones. --Left kidney/ureter: In the left perinephric space there is a new air and fluid collection abutting the lower pole of the left kidney. This collection measures approximately 5.9 cm across. This is likely a small perinephric hematoma in the setting of recent renal biopsy. --Urinary bladder: There is diffuse bladder wall thickening. Stomach/Bowel: --Stomach/Duodenum: No hiatal hernia or other gastric abnormality. Normal duodenal course and caliber. --Small bowel: There is mild wall thickening of several  loops of small bowel in the mid left abdomen. --Colon: There appears to be mild wall thickening of the descending colon. --Appendix: Normal. Vascular/Lymphatic: Normal course and caliber of the major abdominal vessels. --No retroperitoneal lymphadenopathy. --No mesenteric lymphadenopathy. --No pelvic or inguinal lymphadenopathy. Reproductive: Unremarkable Other: There is a small volume of free fluid in the abdomen and pelvis. There is anasarca. Musculoskeletal. 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 IMPRESSION: 1. New collection about the left kidney favored to represent a small perinephric hematoma in the setting of recent renal biopsy. 2. Anemia. 3. Anasarca. 4. No specific abnormality identified to explain the patient's symptoms. Electronically Signed   By: Katherine Mantle M.D.   On: 03/06/2021 19:47   CT ANGIO HEAD W OR WO CONTRAST  Result Date: 03/28/2021 CLINICAL DATA:  Acute intracranial hemorrhage. Assess for mycotic aneurysm. EXAM: CT ANGIOGRAPHY HEAD TECHNIQUE: Multidetector CT imaging of the head was performed using the standard protocol during bolus administration of intravenous contrast. Multiplanar CT image reconstructions and MIPs were obtained to evaluate the vascular anatomy. CONTRAST:  75mL OMNIPAQUE IOHEXOL 350 MG/ML SOLN COMPARISON:  Head CT yesterday. FINDINGS: CT HEAD Brain: Interval left craniotomy for evacuation of left subdural hematoma. Good of Accu a shin with only a small amount of residual subdural blood and air. Less mass effect, with left-to-right shift now measuring 11 mm compared with 16 mm yesterday. Left parieto-occipital intraparenchymal hematoma today measures 7.4 x 4.1 x 5.5 cm (volume = 87 cm^3), increased from yesterday where the volume was at approximately 65 cc. Mild surrounding edema. There is been intraventricular penetration, blood now present within the left lateral ventricle and the third ventricle, with a small  amount layering in the occipital horn of the right lateral ventricle. The ventricles are mildly dilated compared to the baseline study of 03/02/2021, asymmetrically more on the left than the right. Vascular: No vascular finding evident on the noncontrast portion  of the study. Skull: Left craniotomy as noted above. Sinuses: Clear Other: None CTA HEAD Anterior circulation: Both internal carotid arteries are patent through the skull base and siphon regions. The anterior and middle cerebral vessels appear normal without evidence of large or medium vessel occlusion, aneurysm or vascular malformation. Posterior circulation: Both vertebral arteries are patent through the foramen magnum to the basilar. No basilar stenosis or aneurysm. Posterior circulation branch vessels appear normal. There is no definable high flow vascular abnormality or aneurysm in the region the posterior brain hemorrhage on the left. Venous sinuses: Superior sagittal sinus appears normal. Scan timing is not adequate to evaluate the transverse sinuses and sigmoid sinuses. Anatomic variants: None other significant. Review of the MIP images confirms the above findings. IMPRESSION: 1. Interval left craniotomy for evacuation of left subdural hematoma. Good evacuation with only a small amount of residual subdural blood and air. Less mass effect, with left-to-right shift now measuring 11 mm compared with 16 mm yesterday. 2. Left parieto-occipital intraparenchymal hematoma today measures 7.4 x 4.1 x 5.5 cm, estimated volume 87 cc, increased from yesterday where the volume was at approximately 65 cc. Mild surrounding edema. 3. Interval intraventricular penetration, blood now present within the left lateral ventricle and the third ventricle, with a small amount layering in the occipital horn of the right lateral ventricle. The ventricles are mildly dilated compared to the baseline study of 03/02/2021, asymmetrically more on the left than the right. 4. No  definable high flow vascular abnormality or aneurysm in the region the posterior brain hemorrhage on the left, or elsewhere. Electronically Signed   By: Paulina Fusi M.D.   On: 03/28/2021 09:01   DG Orthopantogram  Result Date: 03/20/2021 CLINICAL DATA:  Poor dentition EXAM: ORTHOPANTOGRAM/PANORAMIC COMPARISON:  None. FINDINGS: Panoramic view of the mandible was obtained and reveals no evidence of acute fracture. Temporomandibular joints are within normal limits. No periapical lucency is identified to suggest abscess formation. Multiple amalgam fillings are noted. No significant dental caries are noted on this image. IMPRESSION: No acute abnormality noted. No evidence of periapical abscess. Electronically Signed   By: Alcide Clever M.D.   On: 03/20/2021 18:35   DG Chest 2 View  Result Date: 03/25/2021 CLINICAL DATA:  Chest tube removal. EXAM: CHEST - 2 VIEW COMPARISON:  03/24/2021 FINDINGS: 0603 hours. Low lung volumes. Bibasilar atelectasis noted with tiny bilateral effusions. Left chest tube and mediastinal/pericardial drains removed in the interval. No evidence for persistent pneumothorax. The cardio pericardial silhouette is enlarged. Right PICC line remains in place. IMPRESSION: Interval removal of chest tube and mediastinal/pericardial drains. No evidence for persistent pneumothorax. Electronically Signed   By: Kennith Center M.D.   On: 03/25/2021 08:42   DG Chest 2 View  Result Date: 03/20/2021 CLINICAL DATA:  Poor dentition EXAM: CHEST - 2 VIEW COMPARISON:  03/16/2021 FINDINGS: Cardiac shadow is within normal limits. Linear atelectatic changes are noted in the left mid lung. PICC line is noted with catheter tip at the cavoatrial junction. No focal confluent infiltrate or sizable effusion is seen. No bony abnormality is noted. IMPRESSION: Mild left midlung atelectasis. PICC line stable in appearance as described. Electronically Signed   By: Alcide Clever M.D.   On: 03/20/2021 18:33   DG Chest 2  View  Result Date: 03/16/2021 CLINICAL DATA:  30 year old male with shortness of breath. Lower extremity swelling. EXAM: CHEST - 2 VIEW COMPARISON:  CT Chest, Abdomen, and Pelvis 07/02/2012 and earlier. FINDINGS: Right side PICC line in place. Lower  lung volumes compared to 2009. Cardiac size and mediastinal contours remain normal. Visualized tracheal air column is within normal limits. No pneumothorax, pleural effusion or consolidation. But mild to moderate diffuse increased pulmonary interstitial opacity with a basilar predominance compared to 2009. No acute osseous abnormality identified. Negative visible bowel gas pattern. IMPRESSION: 1. Lower lung volumes with diffusely increased increased pulmonary interstitial opacity. Favor pulmonary interstitial edema in light of lower extremity swelling. Otherwise consider viral/atypical respiratory infection. 2. No pleural effusion.  PICC line in place. Electronically Signed   By: Odessa Fleming M.D.   On: 03/16/2021 07:44   CT Head Wo Contrast  Result Date: 2021/04/06 CLINICAL DATA:  Found unconscious in the bathroom. Abnormal posture. Endocarditis and headache. EXAM: CT HEAD WITHOUT CONTRAST CT CERVICAL SPINE WITHOUT CONTRAST TECHNIQUE: Multidetector CT imaging of the head and cervical spine was performed following the standard protocol without intravenous contrast. Multiplanar CT image reconstructions of the cervical spine were also generated. COMPARISON:  MRI 03/20/2021 FINDINGS: CT HEAD FINDINGS Brain: Development of a large intraparenchymal hematoma in the left parieto-occipital junction region measuring 6.1 x 3.9 x 5.2 cm (volume = 65 cm^3). Small amount of surrounding edema. Patient also has an acute subdural hematoma along the lateral convexity on the left, maximal thickness 1 cm. There is mass effect with left-to-right shift 1.6 cm. Question early trapping of the right lateral ventricle. Vascular: No primary vascular finding. Skull: Normal Sinuses/Orbits:  Clear/normal Other: None CT CERVICAL SPINE FINDINGS Alignment: No malalignment.  Head bent forward. Skull base and vertebrae: Normal Soft tissues and spinal canal: Normal.  Endotracheal intubation. Disc levels:  Normal Upper chest: Not included. Other: None IMPRESSION: HEAD CT: Development of a large intraparenchymal hematoma in the left parieto-occipital junction region measuring 6.1 x 3.9 x 5.2 cm (volume 65 cm^3). Acute subdural hematoma along the lateral convexity on the left, maximal thickness 1 cm. Mass effect with left-to-right shift of 1.6 cm. At MRI 1 week ago, there was low level hemorrhage in the location now occupied by the large hematoma. Findings at that time were hypothesized to relate to posterior reversible encephalopathy, but certainly septic emboli were and are not excluded. Critical Value/emergent results were called by telephone at the time of interpretation on 04-06-21 at 12:53 pm to provider [facility] , who verbally acknowledged these results. CERVICAL SPINE CT: No acute or traumatic finding. Head bent forward. Electronically Signed   By: Paulina Fusi M.D.   On: 04/06/2021 12:58   CT Head Wo Contrast  Result Date: 03/02/2021 CLINICAL DATA:  Headache. EXAM: CT HEAD WITHOUT CONTRAST TECHNIQUE: Contiguous axial images were obtained from the base of the skull through the vertex without intravenous contrast. COMPARISON:  12/15/2017. FINDINGS: Brain: No evidence of acute infarction, hemorrhage, hydrocephalus, extra-axial collection or mass lesion/mass effect. 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 Vascular: No hyperdense vessel identified. Skull: No acute fracture. Sinuses/Orbits: Minimal ethmoid air cell mucosal thickening. No air-fluid levels visualized. Unremarkable visualized orbits. Other: No mastoid effusion. IMPRESSION: No evidence of acute intracranial abnormality. Electronically Signed   By: Feliberto Harts MD   On: 03/02/2021 16:30    CT Cervical Spine Wo Contrast  Result Date: Apr 06, 2021 CLINICAL DATA:  Found unconscious in the bathroom. Abnormal posture. Endocarditis and headache. EXAM: CT HEAD WITHOUT CONTRAST CT CERVICAL SPINE WITHOUT CONTRAST TECHNIQUE: Multidetector CT imaging of the head and cervical spine was performed following the standard protocol without intravenous contrast. Multiplanar CT image reconstructions of the cervical  spine were also generated. COMPARISON:  MRI 03/20/2021 FINDINGS: CT HEAD FINDINGS Brain: Development of a large intraparenchymal hematoma in the left parieto-occipital junction region measuring 6.1 x 3.9 x 5.2 cm (volume = 65 cm^3). Small amount of surrounding edema. Patient also has an acute subdural hematoma along the lateral convexity on the left, maximal thickness 1 cm. There is mass effect with left-to-right shift 1.6 cm. Question early trapping of the right lateral ventricle. Vascular: No primary vascular finding. Skull: Normal Sinuses/Orbits: Clear/normal Other: None CT CERVICAL SPINE FINDINGS Alignment: No malalignment.  Head bent forward. Skull base and vertebrae: Normal Soft tissues and spinal canal: Normal.  Endotracheal intubation. Disc levels:  Normal Upper chest: Not included. Other: None IMPRESSION: HEAD CT: Development of a large intraparenchymal hematoma in the left parieto-occipital junction region measuring 6.1 x 3.9 x 5.2 cm (volume 65 cm^3). Acute subdural hematoma along the lateral convexity on the left, maximal thickness 1 cm. Mass effect with left-to-right shift of 1.6 cm. At MRI 1 week ago, there was low level hemorrhage in the location now occupied by the large hematoma. Findings at that time were hypothesized to relate to posterior reversible encephalopathy, but certainly septic emboli were and are not excluded. Critical Value/emergent results were called by telephone at the time of interpretation on 04-16-2021 at 12:53 pm to provider [facility] , who verbally acknowledged  these results. CERVICAL SPINE CT: No acute or traumatic finding. Head bent forward. Electronically Signed   By: Paulina Fusi M.D.   On: 04/16/21 12:58   MR BRAIN W WO CONTRAST  Result Date: 03/20/2021 CLINICAL DATA:  Endocarditis and headache. EXAM: MRI HEAD WITHOUT AND WITH CONTRAST TECHNIQUE: Multiplanar, multiecho pulse sequences of the brain and surrounding structures were obtained without and with intravenous contrast. CONTRAST:  8mL GADAVIST GADOBUTROL 1 MMOL/ML IV SOLN COMPARISON:  Head CT 03/02/2021 FINDINGS: Brain: The brainstem and cerebellum are normal. Within both occipital regions, there are small regions of edema. This edema affects the cortical and subcortical brain and is more extensive on the left than the right. Petechial blood products are present in both regions. No mass effect or shift. Cerebral hemispheres are otherwise normal. No restricted diffusion. No hydrocephalus or extra-axial collection. 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 In this patient with possible bacterial endocarditis, the possibility septic emboli does exist but seems less likely. There is a punctate focus of enhancement in the right occipital region but no other abnormal enhancement in those regions or elsewhere. Certainly, we often seen minor changes of enhancement in cases of posterior reversible encephalopathy. Vascular: Major vessels at the base of the brain show flow. Skull and upper cervical spine: Negative Sinuses/Orbits: Clear/normal Other: None IMPRESSION: Abnormal edema affecting the cortical and subcortical brain in both occipital regions, more extensive on the left than the right. Petechial blood products without mass effect or shift. 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 In this patient with possible bacterial endocarditis, the possibility of septic emboli does exist but seems less likely. See above full discussion.  Electronically Signed   By: Paulina Fusi M.D.   On: 03/20/2021 19:13   US Venous Img Lower Bilateral (DVT)  Result Date: 03/16/2021 CLINICAL DATA:  Generalized edema for 2 weeks. EXAM: BILATERAL LOWER EXTREMITY VENOUS DOPPLER ULTRASOUND TECHNIQUE: Gray-scale sonography with compression, as well as color and duplex ultrasound, were performed to evaluate the deep venous system(s) from the level of the common femoral vein through the popliteal  and proximal calf veins. COMPARISON:  None. FINDINGS: VENOUS Normal compressibility of the common femoral, superficial femoral, and popliteal veins, as well as the visualized calf veins. Visualized portions of profunda femoral vein and great saphenous vein unremarkable. No filling defects to suggest DVT on grayscale or color Doppler imaging. Doppler waveforms show normal direction of venous flow, normal respiratory plasticity and response to augmentation. OTHER None. Limitations: none IMPRESSION: Negative for DVT in the bilateral lower extremities. Electronically Signed   By: Emmaline Kluver M.D.   On: 03/16/2021 12:35   US Venous Img Upper Uni Right(DVT)  Result Date: 03/17/2021 CLINICAL DATA:  Pain and swelling for 1 day EXAM: RIGHT UPPER EXTREMITY VENOUS DOPPLER ULTRASOUND TECHNIQUE: Gray-scale sonography with graded compression, as well as color Doppler and duplex ultrasound were performed to evaluate the upper extremity deep venous system from the level of the subclavian vein and including the jugular, axillary, basilic, radial, ulnar and upper cephalic vein. Spectral Doppler was utilized to evaluate flow at rest and with distal augmentation maneuvers. COMPARISON:  None. FINDINGS: Contralateral Subclavian Vein: Respiratory phasicity is normal and symmetric with the symptomatic side. No evidence of thrombus. Normal compressibility. Internal Jugular Vein: No evidence of thrombus. Normal compressibility, respiratory phasicity and response to augmentation. Subclavian  Vein: No evidence of thrombus. Normal compressibility, respiratory phasicity and response to augmentation. Axillary Vein: No evidence of thrombus. Normal compressibility, respiratory phasicity and response to augmentation. Cephalic Vein: Partially occlusive thrombus noted in the distal upper arm. Basilic Vein: No evidence of thrombus. Normal compressibility, respiratory phasicity and response to augmentation. Brachial Veins: Small amount of nonocclusive thrombus noted in the right brachial vein around the PICC. Radial Veins: No evidence of thrombus. Normal compressibility, respiratory phasicity and response to augmentation. Ulnar Veins: No evidence of thrombus. Normal compressibility, respiratory phasicity and response to augmentation. Venous Reflux:  None visualized. Other Findings:  None visualized. IMPRESSION: 1. Small amount of nonocclusive thrombus noted in the distal upper arm cephalic vein. 2. Small amount of nonocclusive thrombus noted adjacent to PICC in the brachial vein in the mid upper arm. Electronically Signed   By: Acquanetta Belling M.D.   On: 03/17/2021 08:51   DG Chest Port 1 View  Result Date: 04/10/21 CLINICAL DATA:  Hypoxia EXAM: PORTABLE CHEST 1 VIEW COMPARISON:  04-10-2021 study obtained earlier in the day FINDINGS: Endotracheal tube tip is 3.1 cm above the carina. Nasogastric tube tip and side port are below the diaphragm. Central catheter tip is in the superior vena cava near the cavoatrial junction. No pneumothorax. There is ill-defined airspace opacity in the right lower lobe with left base atelectasis and small left pleural effusion. Heart is upper normal in size with pulmonary vascularity within normal limits. Prosthetic valve present. No adenopathy. No bone lesions. IMPRESSION: Tube and catheter positions as described without pneumothorax. Airspace opacity right lower lobe consistent with pneumonia or potential aspiration. Left base atelectasis noted. Small left pleural effusion.  Stable cardiac silhouette. Electronically Signed   By: Bretta Bang III M.D.   On: 04/10/21 17:23   DG Chest Portable 1 View  Result Date: 2021/04/10 CLINICAL DATA:  Post intubation. EXAM: PORTABLE CHEST 1 VIEW COMPARISON:  Radiograph 03/26/2019 FINDINGS: Endotracheal tube 3.5 cm from carina in good position. RIGHT PICC line noted. LEFT basilar atelectasis small LEFT effusion noted. Prosthetic heart valve noted. IMPRESSION: Endotracheal tube in good position. LEFT basilar atelectasis and effusion. Electronically Signed   By: Genevive Bi M.D.   On: 04-10-21 13:44  DG CHEST PORT 1 VIEW  Result Date: 03/24/2021 CLINICAL DATA:  Chest tube placement. Status post mitral valve repair EXAM: PORTABLE CHEST 1 VIEW COMPARISON:  03/23/2021 . FINDINGS: Right arm PICC line tip is in the distal SVC. Mediastinal drain and left chest tubes are in place. No pneumothorax visualized. Stable cardiomediastinal contours. Small right pleural effusion is noted. Linear areas of atelectasis are noted within the left midlung and left base. IMPRESSION: 1. Stable exam. No pneumothorax visualized. 2. Small right pleural effusion. Electronically Signed   By: Signa Kell M.D.   On: 03/24/2021 07:48   DG Chest Port 1 View  Result Date: 03/23/2021 CLINICAL DATA:  Atelectasis, follow-up EXAM: PORTABLE CHEST 1 VIEW COMPARISON:  03/22/2021 FINDINGS: Removal of Swan-Ganz catheter. Left IJ Cordis remains in place. Right arm PICC line tip is in the SVC. Mitral valve prosthesis. Mediastinal drain and left chest tubes are again noted. No pneumothorax. Decreased lung volumes with left base atelectasis is similar to previous exam. No interstitial edema. IMPRESSION: No change in low lung volumes with left base atelectasis. Electronically Signed   By: Signa Kell M.D.   On: 03/23/2021 08:08   DG Chest Port 1 View  Result Date: 03/22/2021 CLINICAL DATA:  Follow-up valve replacement EXAM: PORTABLE CHEST 1 VIEW COMPARISON:   03/21/2021 FINDINGS: Prosthetic mitral valve in place. Endotracheal tube and nasogastric tube have been removed. Mediastinal drain and left chest tube remain in place. No pneumothorax. Right arm PICC tip in the SVC above the right atrium. Swan-Ganz catheter tip in the main pulmonary artery or proximal left pulmonary artery. Slight worsening atelectasis in the lower lobes following extubation, left more than right. Gaseous distension of the stomach. IMPRESSION: 1. Lines and tubes well positioned. No pneumothorax. 2. Slight worsening of atelectasis in the lower lobes following extubation. 3. Gaseous distension of the stomach. Electronically Signed   By: Paulina Fusi M.D.   On: 03/22/2021 08:26   DG Chest Port 1 View  Result Date: 03/21/2021 CLINICAL DATA:  Status post mitral valve repair. EXAM: PORTABLE CHEST 1 VIEW COMPARISON:  Chest radiograph from one day prior. FINDINGS: Endotracheal tube tip is 4.7 cm above the carina. Enteric tube enters stomach with the tip not seen on this image. Right PICC terminates over the cavoatrial junction. Left internal jugular Swan-Ganz catheter terminates over the right lower lung. Mitral annuloplasty ring in place. Mediastinal drains and left chest tube are in place. Stable cardiomediastinal silhouette with mild cardiomegaly. Probable tiny 1-2% left apical pneumothorax. No right pneumothorax. No pleural effusion. Borderline mild pulmonary edema. Mild left basilar atelectasis. IMPRESSION: 1. Left internal jugular Swan-Ganz catheter terminates within a right lower lobe pulmonary artery branch, consider retracting 6-8 cm. Otherwise well-positioned support structures. 2. Probable tiny 1-2% left apical pneumothorax. Left chest tube in place. 3. Borderline mild congestive heart failure. These results will be called to the ordering clinician or representative by the Radiologist Assistant, and communication documented in the PACS or [employer]. Electronically Signed   By: Delbert Phenix M.D.   On: 03/21/2021 14:28   DG Abd Portable 1V  Result Date: Apr 20, 2021 CLINICAL DATA:  Orogastric tube placement EXAM: PORTABLE ABDOMEN - 1 VIEW COMPARISON:  None. FINDINGS: Orogastric tube tip and side port in stomach. Moderate stool in colon. No bowel dilatation or air-fluid level to suggest bowel obstruction. No free air. IMPRESSION: Orogastric tube tip and side port in stomach. No bowel obstruction or free air. Moderate stool in colon. Electronically Signed   By: Chrissie Noa  Margarita Grizzle III M.D.   On: 04/18/21 17:23   ECHOCARDIOGRAM COMPLETE  Result Date: 03/18/2021    ECHOCARDIOGRAM REPORT   Patient Name:   LENORRIS KARGER Date of Exam: 03/18/2021 Medical Rec #:  [id number]      Height:       67.0 in Accession #:    [id number]     Weight:       179.8 lb Date of Birth:  12-Mar-1991      BSA:          1.933 m Patient Age:    30 years       BP:           130/92 mmHg Patient Gender: M              HR:           78 bpm. Exam Location:  [facility] Procedure: 2D Echo, Color Doppler and Cardiac Doppler Indications:     I34.0 Mitral valve insufficiency  History:         Patient has prior history of Echocardiogram examinations, most                  recent 03/06/2021. Mitral Valve Disease.  Sonographer:     Humphrey Rolls RDCS (AE) Referring Phys:  [id number] Lennon Alstrom Diagnosing Phys: Lorine Bears MD IMPRESSIONS  1. Left ventricular ejection fraction, by estimation, is 60 to 65%. The left ventricle has normal function. The left ventricle has no regional wall motion abnormalities. The left ventricular internal cavity size was mildly dilated. Left ventricular diastolic parameters are indeterminate.  2. Right ventricular systolic function is normal. The right ventricular size is normal. There is mildly elevated pulmonary artery systolic pressure. The estimated right ventricular systolic pressure is 44.2 mmHg.  3. Left atrial size was mildly dilated.  4. The pericardial effusion is circumferential.  5. The mitral valve  is abnormal. 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 Moderate to severe mitral valve regurgitation. No evidence of mitral stenosis. 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  6. The aortic valve is normal in structure. Aortic valve regurgitation is not visualized. No aortic stenosis is present.  7. The inferior vena cava is normal in size with greater than 50% respiratory variability, suggesting right atrial pressure of 3 mmHg. FINDINGS  Left Ventricle: Left ventricular ejection fraction, by estimation, is 60 to 65%. The left ventricle has normal function. The left ventricle has no regional wall motion abnormalities. The left ventricular internal cavity size was mildly dilated. There is  no left ventricular hypertrophy. Left ventricular diastolic parameters are indeterminate. Right Ventricle: The right ventricular size is normal. No increase in right ventricular wall thickness. Right ventricular systolic function is normal. There is mildly elevated pulmonary artery systolic pressure. The tricuspid regurgitant velocity is 3.21  m/s, and with an assumed right atrial pressure of 3 mmHg, the estimated right ventricular systolic pressure is 44.2 mmHg. Left Atrium: Left atrial size was mildly dilated. Right Atrium: Right atrial size was normal in size. Pericardium: Trivial pericardial effusion is present. The pericardial effusion is circumferential. Mitral Valve: The mitral valve is abnormal. 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 There is moderate thickening of the mitral valve leaflet(s). Moderate to severe mitral valve regurgitation. No evidence of mitral valve stenosis. MV peak gradient, 6.2 mmHg. The mean mitral valve gradient is 4.0 mmHg. Tricuspid Valve: The tricuspid valve is normal in structure.  Tricuspid valve regurgitation is mild . No evidence of tricuspid stenosis. Aortic Valve: The aortic valve is normal in structure. Aortic valve  regurgitation is not visualized. No aortic stenosis is present. Aortic valve mean gradient measures 4.0 mmHg. Aortic valve peak gradient measures 8.0 mmHg. Aortic valve area, by VTI measures 3.03 cm. Pulmonic Valve: The pulmonic valve was normal in structure. Pulmonic valve regurgitation is mild. No evidence of pulmonic stenosis. Aorta: The aortic root is normal in size and structure. Venous: The inferior vena cava is normal in size with greater than 50% respiratory variability, suggesting right atrial pressure of 3 mmHg. IAS/Shunts: No atrial level shunt detected by color flow Doppler.  LEFT VENTRICLE PLAX 2D LVIDd:         5.30 cm  Diastology LVIDs:         3.50 cm  LV e' medial:    9.46 cm/s LV PW:         1.00 cm  LV E/e' medial:  14.3 LV IVS:        0.80 cm  LV e' lateral:   16.80 cm/s LVOT diam:     2.10 cm  LV E/e' lateral: 8.0 LV SV:         83 LV SV Index:   43 LVOT Area:     3.46 cm  RIGHT VENTRICLE RV Basal diam:  2.80 cm TAPSE (M-mode): 3.4 cm LEFT ATRIUM             Index       RIGHT ATRIUM           Index LA diam:        4.00 cm 2.07 cm/m  RA Area:     15.10 cm LA Vol (A2C):   55.5 ml 28.72 ml/m RA Volume:   31.80 ml  16.45 ml/m LA Vol (A4C):   73.5 ml 38.03 ml/m LA Biplane Vol: 63.9 ml 33.06 ml/m  AORTIC VALVE                   PULMONIC VALVE AV Area (Vmax):    2.95 cm    PV Vmax:       0.85 m/s AV Area (Vmean):   2.94 cm    PV Vmean:      58.000 cm/s AV Area (VTI):     3.03 cm    PV VTI:        0.204 m AV Vmax:           141.00 cm/s PV Peak grad:  2.9 mmHg AV Vmean:          99.400 cm/s PV Mean grad:  1.0 mmHg AV VTI:            0.273 m AV Peak Grad:      8.0 mmHg AV Mean Grad:      4.0 mmHg LVOT Vmax:         120.00 cm/s LVOT Vmean:        84.300 cm/s LVOT VTI:          0.239 m LVOT/AV VTI ratio: 0.88  AORTA Ao Root diam: 3.30 cm MITRAL VALVE                TRICUSPID VALVE MV Area (PHT): 4.29 cm     TR Peak grad:   41.2 mmHg MV Area VTI:   2.71 cm     TR Vmax:        321.00 cm/s MV Peak  grad:  6.2 mmHg MV Mean grad:  4.0 mmHg     SHUNTS MV Vmax:       1.25 m/s     Systemic VTI:  0.24 m MV Vmean:      90.6 cm/s    Systemic Diam: 2.10 cm MV Decel Time: 177 msec MV E velocity: 135.00 cm/s MV A velocity: 95.40 cm/s MV E/A ratio:  1.42 Lorine Bears MD Electronically signed by Lorine Bears MD Signature Date/Time: 03/18/2021/2:50:06 PM    Final    ECHOCARDIOGRAM COMPLETE  Result Date: 03/03/2021    ECHOCARDIOGRAM REPORT   Patient Name:   MONTERRIUS CARDOSA Date of Exam: 03/03/2021 Medical Rec #:  [id number]      Height:       67.0 in Accession #:    [id number]     Weight:       138.0 lb Date of Birth:  Mar 21, 1991      BSA:          1.727 m Patient Age:    30 years       BP:           114/79 mmHg Patient Gender: M              HR:           88 bpm. Exam Location:  [facility] Procedure: 2D Echo, Cardiac Doppler and Color Doppler Indications:     Murmur R01.1  History:         Patient has no prior history of Echocardiogram examinations.  Sonographer:     Neysa Bonito Roar Referring Phys:  [id number] Venora Maples Diagnosing Phys: Julien Nordmann MD IMPRESSIONS  1. Left ventricular ejection fraction, by estimation, is 60 to 65%. The left ventricle has normal function. The left ventricle has no regional wall motion abnormalities. Left ventricular diastolic parameters were normal.  2. Right ventricular systolic function is normal. The right ventricular size is normal. There is mildly elevated pulmonary artery systolic pressure. The estimated right ventricular systolic pressure is 38.3 mmHg.  3. Left atrial size was moderately dilated.  4. The mitral valve is abnormal.Posterior leaflet with prolapse, unable to exclude partially flail leaflet or torn chordea (images 30-32) Moderate eccentric mitral valve regurgitation. No evidence of mitral stenosis.  5. The inferior vena cava is dilated in size with >50% respiratory variability, suggesting right atrial pressure of 8 mmHg. FINDINGS  Left Ventricle: Left ventricular ejection  fraction, by estimation, is 60 to 65%. The left ventricle has normal function. The left ventricle has no regional wall motion abnormalities. The left ventricular internal cavity size was normal in size. There is  no left ventricular hypertrophy. Left ventricular diastolic parameters were normal. Right Ventricle: The right ventricular size is normal. No increase in right ventricular wall thickness. Right ventricular systolic function is normal. There is mildly elevated pulmonary artery systolic pressure. The tricuspid regurgitant velocity is 2.66  m/s, and with an assumed right atrial pressure of 10 mmHg, the estimated right ventricular systolic pressure is 38.3 mmHg. Left Atrium: Left atrial size was moderately dilated. Right Atrium: Right atrial size was normal in size. Pericardium: There is no evidence of pericardial effusion. Mitral Valve: The mitral valve is abnormal.Posterior leaflet with prolapse, unable to exclude partially flail leaflet or torn chordea (images 30-32)Moderate mitral valve regurgitation. No evidence of mitral valve stenosis. Tricuspid Valve: The tricuspid valve is normal in structure. Tricuspid valve regurgitation is mild . No evidence of tricuspid stenosis. Aortic Valve: The aortic valve is normal  in structure. Aortic valve regurgitation is not visualized. No aortic stenosis is present. Aortic valve peak gradient measures 7.2 mmHg. Pulmonic Valve: The pulmonic valve was normal in structure. Pulmonic valve regurgitation is not visualized. No evidence of pulmonic stenosis. Aorta: The aortic root is normal in size and structure. Venous: The inferior vena cava is dilated in size with greater than 50% respiratory variability, suggesting right atrial pressure of 8 mmHg. IAS/Shunts: No atrial level shunt detected by color flow Doppler.  LEFT VENTRICLE PLAX 2D LVIDd:         4.86 cm  Diastology LVIDs:         3.55 cm  LV e' medial:    12.40 cm/s LV PW:         1.10 cm  LV E/e' medial:  11.5 LV IVS:         0.95 cm  LV e' lateral:   17.30 cm/s LVOT diam:     2.00 cm  LV E/e' lateral: 8.2 LVOT Area:     3.14 cm  RIGHT VENTRICLE RV Mid diam:    3.43 cm RV S prime:     15.60 cm/s TAPSE (M-mode): 3.4 cm LEFT ATRIUM           Index       RIGHT ATRIUM           Index LA diam:      4.30 cm 2.49 cm/m  RA Area:     17.80 cm LA Vol (A2C): 97.9 ml 56.68 ml/m RA Volume:   50.50 ml  29.24 ml/m LA Vol (A4C): 52.4 ml 30.34 ml/m  AORTIC VALVE                PULMONIC VALVE AV Area (Vmax): 2.95 cm    PV Vmax:        1.18 m/s AV Vmax:        134.00 cm/s PV Peak grad:   5.6 mmHg AV Peak Grad:   7.2 mmHg    RVOT Peak grad: 1 mmHg LVOT Vmax:      126.00 cm/s  AORTA Ao Root diam: 2.70 cm MITRAL VALVE                TRICUSPID VALVE MV Area (PHT): 3.02 cm     TR Peak grad:   28.3 mmHg MV Decel Time: 251 msec     TR Vmax:        266.00 cm/s MV E velocity: 142.00 cm/s MV A velocity: 103.00 cm/s  SHUNTS MV E/A ratio:  1.38         Systemic Diam: 2.00 cm MV A Prime:    10.4 cm/s Julien Nordmann MD Electronically signed by Julien Nordmann MD Signature Date/Time: 03/03/2021/6:08:05 PM    Final    CT Renal Stone Study  Result Date: 03/02/2021 CLINICAL DATA:  Renal parenchymal disease with hematuria EXAM: CT ABDOMEN AND PELVIS WITHOUT CONTRAST TECHNIQUE: Multidetector CT imaging of the abdomen and pelvis was performed following the standard protocol without IV contrast. COMPARISON:  07/02/2012 FINDINGS: Lower chest: No acute abnormality. Hepatobiliary: No focal hepatic abnormality. Gallbladder unremarkable. Pancreas: No focal abnormality or ductal dilatation. Spleen: No focal abnormality.  Normal size. Adrenals/Urinary Tract: No adrenal abnormality. No focal renal abnormality. No stones or hydronephrosis. Urinary bladder is unremarkable. Stomach/Bowel: Normal appendix. Stomach, large and small bowel grossly unremarkable. Vascular/Lymphatic: No evidence of aneurysm or adenopathy. Reproductive: No visible focal abnormality. Other: Small  amount of free fluid noted adjacent to the liver  and in the cul-de-sac. Musculoskeletal: No acute bony abnormality. IMPRESSION: No renal or ureteral stones.  No hydronephrosis. Small amount of free fluid in the abdomen and pelvis of unknown etiology. Electronically Signed   By: Charlett NoseKevin  Dover M.D.   On: 03/02/2021 20:38   ECHO TEE  Result Date: 03/06/2021    TRANSESOPHOGEAL ECHO REPORT   Patient Name:   Marshell LevanCODY L Borror Date of Exam: 03/06/2021 Medical Rec #:  [id number]      Height:       67.0 in Accession #:    [id number]     Weight:       132.0 lb Date of Birth:  February 19, 1991      BSA:          1.695 m Patient Age:    30 years       BP:           121/73 mmHg Patient Gender: M              HR:           68 bpm. Exam Location:  [facility] Procedure: Transesophageal Echo, Color Doppler and Cardiac Doppler Indications:     Not listed on TEE check-in sheet  History:         Patient has prior history of Echocardiogram examinations, most                  recent 03/03/2021. Signs/Symptoms:Murmur.  Sonographer:     Cristela BlueJerry Hege RDCS (AE) Referring Phys:  [id number] Francee NodalADENCE H FURTH Diagnosing Phys: Debbe OdeaBrian Agbor-Etang MD PROCEDURE: The transesophogeal probe was passed without difficulty through the esophogus of the patient. Sedation performed by performing physician. The patient developed no complications during the procedure. IMPRESSIONS  1. Left ventricular ejection fraction, by estimation, is 60 to 65%. The left ventricle has normal function. The left ventricle has no regional wall motion abnormalities.  2. Right ventricular systolic function is normal. The right ventricular size is normal.  3. No left atrial/left atrial appendage thrombus was detected.  4. 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 The mitral valve is abnormal. Mild to moderate mitral valve regurgitation. 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  5. The aortic valve is tricuspid. Aortic valve regurgitation is not  visualized. 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 There is no clear evidence for endocarditis. likely Etiology for ruptured chordae include mitral valve prolapse or endocarditis (since patient is bacteremic).  Recommend emperic treatment for endocarditis since patient is bacteremic. Surgical consult for mitral valve repair/replacement considerations can be obtained on outpatient basis (patient is hemodynamically stable and not in heart failure). FINDINGS  Left Ventricle: Left ventricular ejection fraction, by estimation, is 60 to 65%. The left ventricle has normal function. The left ventricle has no regional wall motion abnormalities. The left ventricular internal cavity size was normal in size. Right Ventricle: The right ventricular size is normal. No increase in right ventricular wall thickness. Right ventricular systolic function is normal. Left Atrium: Left atrial size was normal in size. No left atrial/left atrial appendage thrombus was detected. Right Atrium: Right atrial size was normal in size. Pericardium: There is no evidence of pericardial effusion. 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 The mitral valve is abnormal. 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 Mild to moderate mitral valve regurgitation. Tricuspid Valve: The tricuspid valve is  normal in structure. Tricuspid valve regurgitation is trivial. Aortic Valve: The aortic valve is tricuspid. Aortic valve regurgitation is not visualized. Pulmonic Valve: The pulmonic valve was normal in structure. Pulmonic valve regurgitation is trivial. Aorta: The aortic root is normal in size and structure. Venous: The inferior vena cava was not well visualized. IAS/Shunts: No atrial level shunt detected by color flow Doppler. Debbe Odea MD Electronically signed by Debbe Odea MD Signature Date/Time: 03/06/2021/5:00:13 PM     Final    ECHO INTRAOPERATIVE TEE  Result Date: 03/21/2021  *INTRAOPERATIVE TRANSESOPHAGEAL REPORT *  Patient Name:   MINH ROANHORSE Date of Exam: 03/21/2021 Medical Rec #:  [id number]      Height:       67.0 in Accession #:    [id number]     Weight:       172.0 lb Date of Birth:  09-05-1991      BSA:          1.90 m Patient Age:    30 years       BP:           137/93 mmHg Patient Gender: M              HR:           80 bpm. Exam Location:  Anesthesiology Transesophogeal exam was perform intraoperatively during surgical procedure. Patient was closely monitored under general anesthesia during the entirety of examination. Indications:     I34.0 Mitral valve insufficiency Sonographer:     Leta Jungling RDCS Performing Phys: [id number] OWEN Diagnosing Phys: Gaynelle Adu MD Complications: No known complications during this procedure. POST-OP IMPRESSIONS - Left Ventricle: The left ventricle is unchanged from pre-bypass. - Right Ventricle: The right ventricle appears unchanged from pre-bypass. - Left Atrial Appendage: The left atrial appendage appears unchanged from pre-bypass. - Aortic Valve: The aortic valve appears unchanged from pre-bypass. - Mitral Valve: There is no regurgitation. The mitral valve is status post repair with an annuloplasty ring. The mean gradient across the valve is 7-30mmHg. - Tricuspid Valve: The tricuspid valve appears unchanged from pre-bypass. - Pulmonic Valve: The pulmonic valve appears unchanged from pre-bypass. PRE-OP FINDINGS  Left Ventricle: The left ventricle has normal systolic function, with an ejection fraction of 60-65%. The cavity size was normal. There is no increase in left ventricular wall thickness. There is no left ventricular hypertrophy. Right Ventricle: The right ventricle has normal systolic function. The cavity was normal. There is no increase in right ventricular wall thickness. Left Atrium: Left atrial size was not assessed. No left atrial/left atrial  appendage thrombus was detected. Right Atrium: Right atrial size was not assessed. Interatrial Septum: No atrial level shunt detected by color flow Doppler. Pericardium: Trivial pericardial effusion is present. The pericardial effusion is posterior to the left ventricle. Mitral Valve: Abnormal. Mitral valve regurgitation is moderate by color flow Doppler. The MR jet is posteriorly-directed. There is a small mobile mitral valve vegetation present on the A1 cusp. Tricuspid Valve: The tricuspid valve was normal in structure. Tricuspid valve regurgitation was not visualized by color flow Doppler. Aortic Valve: The aortic valve is normal in structure. Aortic valve regurgitation was not visualized by color flow Doppler. Pulmonic Valve: The pulmonic valve was normal in structure. Pulmonic valve regurgitation is trivial by color flow Doppler. +--------------+--------++ LEFT VENTRICLE         +--------------+--------++ PLAX 2D                +--------------+--------++  LVIDd:        4.70 cm  +--------------+--------++ LVIDs:        3.10 cm  +--------------+--------++ LVOT diam:    2.00 cm  +--------------+--------++ LV SV:        64 ml    +--------------+--------++ LV SV Index:  33.28    +--------------+--------++ LVOT Area:    3.14 cm +--------------+--------++                        +--------------+--------++ +-------------+----------++ MITRAL VALVE            +--------------+-------+ +-------------+----------++ SHUNTS                MV Peak grad:14.1 mmHg  +--------------+-------+ +-------------+----------++ Systemic Diam:2.00 cm MV Mean grad:9.0 mmHg   +--------------+-------+ +-------------+----------++ MV Vmax:     1.88 m/s   +-------------+----------++ MV Vmean:    139.0 cm/s +-------------+----------++ MV VTI:      0.38 m     +-------------+----------++  Gaynelle Adu MD Electronically signed by Gaynelle Adu MD Signature Date/Time:  03/21/2021/2:40:19 PM    Final    US BIOPSY (KIDNEY)  Result Date: 03/04/2021 INDICATION: 30 year old male with a history of acute renal failure and 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 ANESTHESIA/SEDATION: Moderate (conscious) sedation was employed during this procedure. A total of Versed 2.0 mg and Fentanyl 100 mcg was administered intravenously. Moderate Sedation Time: 10 minutes. The patient's level of consciousness and vital signs were monitored continuously by radiology nursing throughout the procedure under my direct supervision. FLUOROSCOPY TIME:  Ultrasound COMPLICATIONS: None PROCEDURE: Informed written consent was obtained from the patient after a thorough discussion of the procedural risks, benefits and alternatives. All questions were addressed. Maximal Sterile Barrier Technique was utilized including caps, mask, sterile gowns, sterile gloves, sterile drape, hand hygiene and skin antiseptic. A timeout was performed prior to the initiation of the procedure Patient was positioned prone position on the gantry table. Images were stored sent to PACs. Once the patient is prepped and draped in the usual sterile fashion, the skin and subcutaneous tissues overlying the left kidney were generously infiltrated 1% lidocaine for local anesthesia. Using ultrasound guidance, a 15 gauge guide needle was advanced into the lower cortex of the left kidney. Once we confirmed location of the needle tip, 2 separate 16 gauge core biopsy were achieved. Two Gel-Foam pledgets were infused with a small amount of saline. The needle was removed. Final images were stored. The patient tolerated the procedure well and remained hemodynamically stable throughout. No complications were encountered and no significant blood loss encountered. IMPRESSION: Status post ultrasound-guided biopsy of left kidney for medical renal purpose. Signed, Yvone Neu. Reyne Dumas, RPVI [facility] Radiology Electronically Signed   By: Gilmer Mor D.O.   On: 03/04/2021 12:11   ECHOCARDIOGRAM COMPLETE BUBBLE STUDY  Result Date: 03/28/2021    ECHOCARDIOGRAM REPORT   Patient Name:   TOU HAYNER Date of Exam: 03/28/2021 Medical Rec #:  [id number]      Height:       67.0 in Accession #:    [id number]     Weight:       163.4 lb Date of Birth:  1991-10-01      BSA:          1.856 m Patient Age:    30 years       BP:  97/59 mmHg Patient Gender: M              HR:           112 bpm. Exam Location:  [facility] Procedure: 2D Echo, Cardiac Doppler, Color Doppler and Saline Contrast Bubble            Study Indications:     Endocarditis  History:         Patient has prior history of Echocardiogram examinations, most                  recent 03/21/2021. S/P mitral valve repair.  Sonographer:     Cristela Blue RDCS (AE) Referring Phys:  [id number] Jolyn Nap Diagnosing Phys: Julien Nordmann MD  Sonographer Comments: No apical window, Technically challenging study due to limited acoustic windows and echo performed with patient supine and on artificial respirator. IMPRESSIONS  1. Left ventricular ejection fraction, by estimation, is 55 to 60%. The left ventricle has normal function. The left ventricle has no regional wall motion abnormalities. Left ventricular diastolic parameters are indeterminate.  2. Right ventricular systolic function is normal. The right ventricular size is normal. There is normal pulmonary artery systolic pressure. The estimated right ventricular systolic pressure is 34.4 mmHg.  3. The mitral valve has been repaired/replaced, s/p annuloplasty ring. Difficult to visualize. Mild mitral valve regurgitation. Gradient not obtained.  4. Tricuspid valve regurgitation is mild to moderate.  5. The aortic valve is normal in structure, no vegetation. Aortic valve regurgitation is not visualized.  6. Agitated saline contrast bubble study was negative, with no evidence of any  interatrial shunt.  7. Left pleural effusion noted, 7 cm. FINDINGS  Left Ventricle: Left ventricular ejection fraction, by estimation, is 55 to 60%. The left ventricle has normal function. The left ventricle has no regional wall motion abnormalities. The left ventricular internal cavity size was normal in size. There is  no left ventricular hypertrophy. Left ventricular diastolic parameters are indeterminate. Right Ventricle: The right ventricular size is normal. No increase in right ventricular wall thickness. Right ventricular systolic function is normal. There is normal pulmonary artery systolic pressure. The tricuspid regurgitant velocity is 2.71 m/s, and  with an assumed right atrial pressure of 5 mmHg, the estimated right ventricular systolic pressure is 34.4 mmHg. Left Atrium: Left atrial size was normal in size. Right Atrium: Right atrial size was normal in size. Pericardium: There is no evidence of pericardial effusion. Mitral Valve: The mitral valve has been repaired/replaced. Mild mitral valve regurgitation. No evidence of mitral valve stenosis. Tricuspid Valve: The tricuspid valve is normal in structure. Tricuspid valve regurgitation is mild to moderate. No evidence of tricuspid stenosis. Aortic Valve: The aortic valve is normal in structure. Aortic valve regurgitation is not visualized. No aortic stenosis is present. Pulmonic Valve: The pulmonic valve was normal in structure. Pulmonic valve regurgitation is not visualized. No evidence of pulmonic stenosis. Aorta: The aortic root is normal in size and structure. Venous: The inferior vena cava is normal in size with greater than 50% respiratory variability, suggesting right atrial pressure of 3 mmHg. IAS/Shunts: No atrial level shunt detected by color flow Doppler. Agitated saline contrast was given intravenously to evaluate for intracardiac shunting. Agitated saline contrast bubble study was negative, with no evidence of any interatrial shunt.  LEFT  VENTRICLE PLAX 2D LVIDd:         3.74 cm LVIDs:         2.71 cm LV PW:  1.56 cm LV IVS:        0.83 cm LVOT diam:     2.00 cm LVOT Area:     3.14 cm  LEFT ATRIUM         Index LA diam:    3.00 cm 1.62 cm/m                        PULMONIC VALVE AORTA                 PV Vmax:        1.04 m/s Ao Root diam: 2.50 cm PV Peak grad:   4.3 mmHg                       RVOT Peak grad: 8 mmHg  TRICUSPID VALVE TR Peak grad:   29.4 mmHg TR Vmax:        271.00 cm/s  SHUNTS Systemic Diam: 2.00 cm Julien Nordmann MD Electronically signed by Julien Nordmann MD Signature Date/Time: 03/28/2021/2:35:39 PM    Final    US SCROTUM W/DOPPLER  Result Date: 03/16/2021 CLINICAL DATA:  Edema, pain for 2 weeks. EXAM: SCROTAL ULTRASOUND DOPPLER ULTRASOUND OF THE TESTICLES TECHNIQUE: Complete ultrasound examination of the testicles, epididymis, and other scrotal structures was performed. Color and spectral Doppler ultrasound were also utilized to evaluate blood flow to the testicles. COMPARISON:  None. FINDINGS: Right testicle Measurements: 5.1 x 3.0 x 2.8 cm. No mass or microlithiasis visualized. Left testicle Measurements: 4.8 x 3.1 x 3.2 cm. No mass or microlithiasis visualized. Right epididymis:  Normal in size and appearance. Left epididymis:  Normal in size and appearance. Hydrocele:  None visualized. Varicocele:  Unable to evaluate due to extensive scrotal edema. Pulsed Doppler interrogation of both testes demonstrates normal low resistance arterial and venous waveforms bilaterally. Marked bilateral scrotal edema. IMPRESSION: Marked scrotal edema, otherwise no acute finding sonographically. Electronically Signed   By: Emmaline Kluver M.D.   On: 03/16/2021 12:38   VAS US DOPPLER PRE CABG  Result Date: 03/20/2021 PREOPERATIVE VASCULAR EVALUATION  Indications:      Pre-MVR. Comparison Study: No prior studies. Performing Technologist: Jean Rosenthal RDMS,RVT  Examination Guidelines: A complete evaluation includes B-mode imaging,  spectral Doppler, color Doppler, and power Doppler as needed of all accessible portions of each vessel. Bilateral testing is considered an integral part of a complete examination. Limited examinations for reoccurring indications may be performed as noted.  Right Carotid Findings: +----------+--------+--------+--------+--------+--------+           PSV cm/sEDV cm/sStenosisDescribeComments +----------+--------+--------+--------+--------+--------+ CCA Prox  141     29                               +----------+--------+--------+--------+--------+--------+ CCA Distal101     24                               +----------+--------+--------+--------+--------+--------+ ICA Prox  50      20                               +----------+--------+--------+--------+--------+--------+ ICA Distal127     56                      tortuous +----------+--------+--------+--------+--------+--------+ ECA       96                                       +----------+--------+--------+--------+--------+--------+  Portions of this table do not appear on this page. +----------+--------+-------+----------------+------------+           PSV cm/sEDV cmsDescribe        Arm Pressure +----------+--------+-------+----------------+------------+ Subclavian142            Multiphasic, WNL             +----------+--------+-------+----------------+------------+ +---------+--------+--+--------+--+---------+ VertebralPSV cm/s58EDV cm/s19Antegrade +---------+--------+--+--------+--+---------+ Left Carotid Findings: +----------+--------+--------+--------+--------+--------+           PSV cm/sEDV cm/sStenosisDescribeComments +----------+--------+--------+--------+--------+--------+ CCA Prox  158     27                               +----------+--------+--------+--------+--------+--------+ CCA Distal103     20                                +----------+--------+--------+--------+--------+--------+ ICA Prox  75      24                               +----------+--------+--------+--------+--------+--------+ ICA Distal96      47                      tortuous +----------+--------+--------+--------+--------+--------+ ECA       86      16                               +----------+--------+--------+--------+--------+--------+ +----------+--------+--------+----------------+------------+ SubclavianPSV cm/sEDV cm/sDescribe        Arm Pressure +----------+--------+--------+----------------+------------+           136             Multiphasic, WNL             +----------+--------+--------+----------------+------------+ +---------+--------+--+--------+--+---------+ VertebralPSV cm/s71EDV cm/s27Antegrade +---------+--------+--+--------+--+---------+  ABI Findings: +--------+------------------+-----+---------+--------+ Right   Rt Pressure (mmHg)IndexWaveform Comment  +--------+------------------+-----+---------+--------+ Brachial                       triphasicPICC     +--------+------------------+-----+---------+--------+ +--------+------------------+-----+---------+-------+ Left    Lt Pressure (mmHg)IndexWaveform Comment +--------+------------------+-----+---------+-------+ [id number]                    triphasic        +--------+------------------+-----+---------+-------+  Right Doppler Findings: +--------+--------+-----+---------+--------+ Site    PressureIndexDoppler  Comments +--------+--------+-----+---------+--------+ Brachial             triphasicPICC     +--------+--------+-----+---------+--------+ Radial               triphasic         +--------+--------+-----+---------+--------+ Ulnar                triphasic         +--------+--------+-----+---------+--------+  Left Doppler Findings: +--------+--------+-----+---------+--------+ Site    PressureIndexDoppler   Comments +--------+--------+-----+---------+--------+ [id number]          triphasic         +--------+--------+-----+---------+--------+ Radial               triphasic         +--------+--------+-----+---------+--------+ Ulnar                triphasic         +--------+--------+-----+---------+--------+  Summary: Right Carotid: There is no evidence of stenosis in the right ICA.  There was no                evidence of thrombus, dissection, atherosclerotic plaque or                stenosis in the cervical carotid system. Left Carotid: There is no evidence of stenosis in the left ICA. There was no               evidence of thrombus, dissection, atherosclerotic plaque or               stenosis in the cervical carotid system. Vertebrals:  Bilateral vertebral arteries demonstrate antegrade flow. Subclavians: Normal flow hemodynamics were seen in bilateral subclavian              arteries. Right Upper Extremity: Doppler waveforms remain within normal limits with right radial compression. Doppler waveforms remain within normal limits with right ulnar compression. Left Upper Extremity: Doppler waveforms remain within normal limits with left radial compression. Doppler waveforms remain within normal limits with left ulnar compression.  Electronically signed by Heath Lark on 03/20/2021 at 4:34:25 PM.    Final    [place] EKG Site Rite  Result Date: 03/25/2021 If [facility] image not attached, placement could not be confirmed due to current cardiac rhythm.  [place] EKG SITE RITE  Result Date: 03/10/2021 If Site Rite image not attached, placement could not be confirmed due to current cardiac rhythm.   Microbiology Recent Results (from the past 240 hour(s))  MRSA PCR Screening     Status: None   Collection Time: 03/20/21  5:11 PM   Specimen: Nasopharyngeal  Result Value Ref Range Status   MRSA by PCR NEGATIVE NEGATIVE Final    Comment:        The GeneXpert MRSA Assay (FDA approved for NASAL  specimens only), is one component of a comprehensive MRSA colonization surveillance program. It is not intended to diagnose MRSA infection nor to guide or monitor treatment for MRSA infections. Performed at [facility], [address] [place], [place] [address]   Surgical pcr screen     Status: None   Collection Time: 03/20/21 11:48 PM   Specimen: Nasal Mucosa; Nasal Swab  Result Value Ref Range Status   MRSA, PCR NEGATIVE NEGATIVE Final   Staphylococcus aureus NEGATIVE NEGATIVE Final    Comment: (NOTE) The Xpert SA Assay (FDA approved for NASAL specimens in patients 68 years of age and older), is one component of a comprehensive surveillance program. It is not intended to diagnose infection nor to guide or monitor treatment. Performed at [facility], [address] [place], [place] [address]   Fungus Culture With Stain     Status: None (Preliminary result)   Collection Time: 03/21/21 10:24 AM   Specimen: Soft Tissue, Other  Result Value Ref Range Status   Fungus Stain Final report  Final    Comment: (NOTE) Performed At: [facility] [address] [place], [place] [id number] Jolene Schimke MD GY:[id number]    Fungus (Mycology) Culture PENDING  Incomplete   Fungal Source TISSUE  Final    Comment: MITRAL VALVE Performed at [facility], [address], [place], [place] [address]   Aerobic/Anaerobic Culture w Gram Stain (surgical/deep wound)     Status: None   Collection Time: 03/21/21 10:24 AM   Specimen: Soft Tissue, Other  Result Value Ref Range Status   Specimen Description TISSUE MITRAL VALVE  Final   Special Requests NONE  Final   Gram Stain   Final    RARE WBC PRESENT, PREDOMINANTLY MONONUCLEAR NO ORGANISMS SEEN    Culture   Final    RARE STAPHYLOCOCCUS CAPITIS CRITICAL RESULT CALLED TO, READ BACK BY AND VERIFIED WITH: RN C.ECKELNANN AT 1323 ON 03/23/2021 BY T.SAAD NO ANAEROBES ISOLATED Performed at [facility], [address], [place], [place] [address]    Report Status 03/26/2021 FINAL  Final   Organism ID, Bacteria STAPHYLOCOCCUS CAPITIS  Final      Susceptibility   Staphylococcus capitis - MIC*    CIPROFLOXACIN <=0.5 SENSITIVE Sensitive     ERYTHROMYCIN 2 INTERMEDIATE Intermediate     GENTAMICIN <=0.5 SENSITIVE Sensitive     OXACILLIN <=0.25 SENSITIVE Sensitive     TETRACYCLINE <=1 SENSITIVE Sensitive     VANCOMYCIN 1 SENSITIVE Sensitive     TRIMETH/SULFA <=10 SENSITIVE Sensitive     CLINDAMYCIN >=8 RESISTANT Resistant     RIFAMPIN <=0.5 SENSITIVE Sensitive     Inducible Clindamycin NEGATIVE Sensitive     * RARE STAPHYLOCOCCUS CAPITIS  Acid Fast Smear (AFB)     Status: None   Collection Time: 03/21/21 10:24 AM   Specimen: Soft Tissue, Other  Result Value Ref Range Status   AFB Specimen Processing Comment  Final    Comment: Tissue Grinding and Digestion/Decontamination   Acid Fast Smear Negative  Final    Comment: (NOTE) Performed At: [facility] [address] [place], [place] [id number] Jolene Schimke MD VF:[id number]    Source (AFB) TISSUE  Final    Comment: MITRAL VALVE Performed at [facility], [address], [place], [place] [address]   Fungus Culture Result     Status: None   Collection Time: 03/21/21 10:24 AM  Result Value Ref Range Status   Result 1 Comment  Final    Comment: (NOTE) KOH/Calcofluor preparation:  no fungus observed. Performed At: [facility] [address] [place], [place] [id number] Jolene Schimke MD NA:[id number]   Fungus Culture With Stain     Status: None (Preliminary result)   Collection Time: 03/21/21 10:31 AM   Specimen: Soft Tissue, Other  Result Value Ref Range Status   Fungus Stain Final report  Final    Comment: (NOTE) Performed At: [facility] [address] [place], [place] [id number] Jolene Schimke MD [id number]    Fungus (Mycology) Culture PENDING  Incomplete   Fungal Source WOUND  Final    Comment:  MITRAL VALVE SPEC B Performed at [facility], [address], [place], [place] [address]   Aerobic/Anaerobic Culture w Gram Stain (surgical/deep wound)     Status: None   Collection Time: 03/21/21 10:31 AM   Specimen: Soft Tissue, Other  Result Value Ref Range Status   Specimen Description WOUND MITRAL VALVE SPEC B  Final   Special Requests WD MITRAL VALVE  Final   Gram Stain   Final    RARE WBC PRESENT, PREDOMINANTLY MONONUCLEAR RARE GRAM POSITIVE COCCI    Culture   Final    No growth aerobically or anaerobically. Performed at [facility], [address], [place], [place] [address]    Report Status 03/26/2021 FINAL  Final  Acid Fast Smear (AFB)     Status: None   Collection Time: 03/21/21 10:31 AM   Specimen: Soft Tissue, Other  Result Value Ref Range Status   AFB Specimen Processing Comment  Final    Comment: Tissue Grinding and Digestion/Decontamination   Acid  Fast Smear Negative  Final    Comment: (NOTE) Performed At: [facility] [address] [place], [place] [id number] Jolene Schimke MD WU:[id number]    Source (AFB) WOUND  Final    Comment: MITRAL VALVE SPEC B Performed at [facility], [address] [place], [place] [address]   Fungus Culture Result     Status: None   Collection Time: 03/21/21 10:31 AM  Result Value Ref Range Status   Result 1 Comment  Final    Comment: (NOTE) KOH/Calcofluor preparation:  no fungus observed. Performed At: [facility] [address] [place], [place] [id number] Jolene Schimke MD [id number]   Culture, blood (routine x 2)     Status: None (Preliminary result)   Collection Time: 03-Apr-2021 12:25 PM   Specimen: BLOOD  Result Value Ref Range Status   Specimen Description BLOOD RIGHT ANTECUBITAL  Final   Special Requests   Final    BOTTLES DRAWN AEROBIC AND ANAEROBIC Blood Culture adequate volume   Culture   Final    NO GROWTH 2 DAYS Performed at [facility], [address], [place], [place] [address]    Report Status PENDING  Incomplete  Culture, blood (routine x 2)     Status: None (Preliminary result)   Collection Time: Apr 03, 2021 12:25 PM   Specimen: BLOOD  Result Value Ref Range Status   Specimen Description BLOOD BLOOD LEFT HAND  Final   Special Requests   Final    BOTTLES DRAWN AEROBIC AND ANAEROBIC Blood Culture adequate volume   Culture   Final    NO GROWTH 2 DAYS Performed at [facility], [address] [place], [place] [address]    Report Status PENDING  Incomplete  Resp Panel by RT-PCR (Flu A&B, Covid) Nasopharyngeal Swab     Status: None   Collection Time: 04/03/21  1:00 PM   Specimen: Nasopharyngeal Swab; Nasopharyngeal(NP) swabs in vial transport medium  Result Value Ref Range Status   SARS Coronavirus 2 by RT PCR NEGATIVE NEGATIVE Final    Comment: (NOTE) SARS-CoV-2 target nucleic acids are NOT DETECTED.  The SARS-CoV-2 RNA is generally detectable in upper respiratory specimens during the acute phase of infection. The lowest concentration of SARS-CoV-2 viral copies this assay can detect is 138 copies/mL. A negative result does not preclude SARS-Cov-2 infection and should not be used as the sole basis for treatment or other patient management decisions. A negative result may occur with  improper specimen collection/handling, submission of specimen other than nasopharyngeal swab, presence of viral mutation(s) within the areas targeted by this assay, and inadequate number of viral copies(<138 copies/mL). A negative result must be combined with clinical observations, patient history, and epidemiological information. The expected result is Negative.  Fact Sheet for Patients:  BloggerCourse.com  Fact Sheet for Healthcare Providers:  SeriousBroker.it  This test is no t yet approved or cleared by the [place] FDA and  has been authorized for detection and/or diagnosis of  SARS-CoV-2 by FDA under an Emergency Use Authorization (EUA). This EUA will remain  in effect (meaning this test can be used) for the duration of the COVID-19 declaration under Section 564(b)(1) of the Act, 21 U.S.C.section 360bbb-3(b)(1), unless the authorization is terminated  or revoked sooner.       Influenza A by PCR NEGATIVE NEGATIVE Final   Influenza B by PCR NEGATIVE NEGATIVE Final    Comment: (NOTE) The Xpert Xpress SARS-CoV-2/FLU/RSV plus assay is intended as an aid in the diagnosis  of influenza from Nasopharyngeal swab specimens and should not be used as a sole basis for treatment. Nasal washings and aspirates are unacceptable for Xpert Xpress SARS-CoV-2/FLU/RSV testing.  Fact Sheet for Patients: BloggerCourse.com  Fact Sheet for Healthcare Providers: SeriousBroker.it  This test is not yet approved or cleared by the [place] FDA and has been authorized for detection and/or diagnosis of SARS-CoV-2 by FDA under an Emergency Use Authorization (EUA). This EUA will remain in effect (meaning this test can be used) for the duration of the COVID-19 declaration under Section 564(b)(1) of the Act, 21 U.S.C. section 360bbb-3(b)(1), unless the authorization is terminated or revoked.  Performed at [facility], [address] [place], [place] [address]   Culture, blood (routine x 2)     Status: None (Preliminary result)   Collection Time: 03/21/2021  5:15 PM   Specimen: BLOOD  Result Value Ref Range Status   Specimen Description BLOOD BLOOD LEFT WRIST  Final   Special Requests   Final    BOTTLES DRAWN AEROBIC AND ANAEROBIC Blood Culture adequate volume   Culture   Final    NO GROWTH 2 DAYS Performed at [facility], [address] [place], [place] [address]    Report Status PENDING  Incomplete  Culture, blood (routine x 2)     Status: None (Preliminary result)   Collection Time: 03/04/2021  5:16 PM    Specimen: BLOOD  Result Value Ref Range Status   Specimen Description BLOOD BLOOD LEFT FOREARM  Final   Special Requests   Final    BOTTLES DRAWN AEROBIC AND ANAEROBIC Blood Culture adequate volume   Culture   Final    NO GROWTH 2 DAYS Performed at [facility], [address] [place], [place] [address]    Report Status PENDING  Incomplete    Lab Basic Metabolic Panel: Recent Labs  Lab 03/25/21 0411 03/08/2021 1225 03/16/2021 1300 03/21/2021 1714 03/11/2021 1927 03/28/21 0106 03/28/21 0458 03/28/21 1125 03/28/21 1812 03/28/21 2301 17-Apr-2021 0008 04-17-2021 0353 2021/04/17 0747  NA 135 138   < > 142 143   < > 146*   < > 152* 170* 155* 157*  156* 157*  K 4.1 3.2*  --  3.1* 3.4*  --  3.8  --   --   --   --  3.4*  --   CL 102 95*  --  101  --   --  107  --   --   --   --  125*  --   CO2 27 26  --  30  --   --  30  --   --   --   --  26  --   GLUCOSE 93 219*  --  147*  --   --  124*  --   --   --   --  112*  --   BUN 21* 23*  --  23*  --   --  18  --   --   --   --  19  --   CREATININE 1.47* 1.39*  --  1.23  --   --  1.25*  --   --   --   --  1.27*  --   CALCIUM 8.4* 8.8*  --  8.0*  --   --  7.6*  --   --   --   --  7.7*  --   MG  --  1.6*  --   --  1.3*  --  3.2*  --   --   --   --  2.8*  --   PHOS  --   --   --   --  5.2*  --  5.0*  --   --   --   --  4.1  --    < > = values in this interval not displayed.   Liver Function Tests: Recent Labs  Lab 03/17/2021 1225 03/19/2021 1714  AST 45* 36  ALT 89* 74*  ALKPHOS 78 64  BILITOT 0.6 0.6  PROT 7.4 6.6  ALBUMIN 3.4* 3.2*   No results for input(s): LIPASE, AMYLASE in the last 168 hours. No results for input(s): AMMONIA in the last 168 hours. CBC: Recent Labs  Lab 03/25/21 0411 03/26/2021 1225 03/03/2021 1714 03/28/21 0458 03/28/21 2041 04-28-21 0353  WBC 8.5 16.9* 11.6* 8.0  --  7.6  NEUTROABS  --  11.0*  --   --   --   --   HGB 8.6* 10.7* 9.1* 6.9* 7.3* 7.2*  HCT 27.2* 33.0* 28.0* 21.2* 22.9* 23.0*  MCV 91.6 89.9  89.5 90.6  --  92.0  PLT 349 574* 366 303  --  264   Cardiac Enzymes: No results for input(s): CKTOTAL, CKMB, CKMBINDEX, TROPONINI in the last 168 hours. Sepsis Labs: Recent Labs  Lab 03/08/2021 1225 03/22/2021 1714 03/07/2021 1927 03/28/21 0458 Apr 28, 2021 0353  WBC 16.9* 11.6*  --  8.0 7.6  LATICACIDVEN  --  1.3 1.1  --   --     Procedures/Operations  03/22/2021 Left Craniotomy for subdural hematoma 03/16/2021 Arterial line placement 03/17/2021 ETT placed   Cheryll Cockayne Rust-Chester, AGACNP-BC Acute Care Nurse Practitioner [facility]   [phone] / [phone] Please see Amion for pager details.    Atalia Litzinger L Rust-Chester 04/28/21, 10:50 PM

## 2021-03-31 NOTE — TOC Progression Note (Signed)
Transition of Care [facility]) - Progression Note    Patient Details  Name: Jerome Mccormick MRN: [id number] Date of Birth: November 29, 1991  Transition of Care [facility]) CM/SW Contact  Marina Goodell Phone Number:  [phone] 03/09/2021, 11:14 AM  Clinical Narrative:     Patient's family has decided comfort care.  Planned compasionate extubation once all family members have arrived.  Expected Discharge Plan: Home w Home Health Services Barriers to Discharge: Continued Medical Work up  Expected Discharge Plan and Services Expected Discharge Plan: Home w Home Health Services In-house Referral: Clinical Social Work   Post Acute Care Choice: Home Health Living arrangements for the past 2 months: Single Family Home                                       Social Determinants of Health (SDOH) Interventions    Readmission Risk Interventions No flowsheet data found.

## 2021-03-31 NOTE — Progress Notes (Signed)
   03/28/2021 1320  Clinical Encounter Type  Visited With Patient and family together  Visit Type Initial;Spiritual support;Social support;Critical Care;Patient actively dying  Referral From Nurse  Consult/Referral To Chaplain  Stress Factors  Family Stress Factors Loss of control;Major life changes   Chaplain responded to page from nurse. PT is on comfort care. Chaplain ministered with presence, singing, and prayer. Chaplain Aggie Cosier is now at bedside, as PT is actively transitioning.

## 2021-03-31 NOTE — Progress Notes (Signed)
Chaplain provided 3 hours of supportive presence during active dying and death. Significant number of family present. Complex dying and eventually stepped out of room which aided Quinnlan's comfort.  Mother has complex grief due to many loses in her life. Kiefer has lived with his mother his entire life. Family will be using [facility] per grandmother.     03/28/2021 1900  Clinical Encounter Type  Visited With Patient;Family  Visit Type Death;Patient actively dying  Referral From Chaplain

## 2021-03-31 NOTE — Progress Notes (Signed)
Pt was suctioned prior to extubation for no secretions. Per orders, he was extubated to comfort care.

## 2021-03-31 NOTE — Progress Notes (Signed)
PHARMACY CONSULT NOTE - FOLLOW UP  Pharmacy Consult for Electrolyte Monitoring and Replacement   Recent Labs: Potassium (mmol/L)  Date Value  03/28/2021 3.8  03/11/2014 3.7   Magnesium (mg/dL)  Date Value  97/01/6377 3.2 (H)   Calcium (mg/dL)  Date Value  58/85/0277 7.6 (L)   Calcium, Total (mg/dL)  Date Value  41/28/7867 8.6   Albumin (g/dL)  Date Value  67/20/9470 3.2 (L)  03/11/2014 3.7   Phosphorus (mg/dL)  Date Value  96/28/3662 5.0 (H)   Sodium (mmol/L)  Date Value  04/27/2021 155 (H)  03/11/2014 142     Assessment: 30yo male with past medical history for postinfectious glomerulonephritis, bacterial endocarditis (on IV antibiotics - ceftriaxone and ampicillin until 05/02/21), s/p mitral valve repair with large intraparenchymal hematoma with extra-axial extension. Pharmacy has been consulted for electrolyte management.   Pt is also on hypertonic saline @75ml /hr for intracerebral hemorrhage status post craniotomy and evacuation  4/27 1225 Na 138 4/27 1300 Na 140 ... 4/28 0458 Na 146 4/28 1125 Na 151 - notified NP, decreased rate to 38ml/hr and changed Na checks to q4h  4/28 1454 Na 150 4/28 1812 Na 152 4/28 2301 Na 170 - believed to be lab error, ordered repeat Na STAT 4/29 0008 Na 155 - consistent with levels prior to 2300, therefore 2301 level was lab error  Goal of Therapy:  Electrolytes WNL Na 150 mmol/L  Plan:   Sodium maintaining around 150. Remains on hypertonic saline at 65 ml/hr.  Next sodium around 0400.   Re-check electrolytes with AM labs   Maurie Musco D, PharmD 27-Apr-2021 1:42 AM

## 2021-03-31 NOTE — Progress Notes (Signed)
NAME:  Jerome Mccormick, MRN:  [id number], DOB:  [birth year], LOS: 2 ADMISSION DATE:  03/20/2021, CONSULTATION DATE:  03/01/2021 REFERRING MD:  Cari Caraway, CHIEF COMPLAINT:  ICH   History of Present Illness:  This is a 30 yo male with medical history pertinent for recent diagnosis of enterococcal endocarditis.  Patient was transferred to [facility] for surgical intervention.  Patient underwent mitral valve repair with complex valvuloplasty on 03/21/2021.  Patient's postoperative course was uncomplicated.  Chest tubes were removed on postop day 3.  His OR cultures did grow staph capitis on mitral valve tissue.  Patient was to have antibiotics with ampicillin running continuously with intermittent Rocephin dosage.  Patient was apparently discharged on 03/25/2021.  Patient was with his home health aide who walked him to the bathroom this morning.  He was taking a long time and that she went to go check on him.  He was noticed to have been down at this point.  EMS was called.  EMS transported patient to the emergency department.  In route patient was given Narcan.  With poor response.  Patient was intubated in the ED.  In the ED work-up remarkable for the following:  CMP pertinent for elevated creatinine of 1.39 however this is improved from previous creatinines during most recent admission.  CBC with leukocytosis of 16.9 platelets of 574.  Troponin of 356.  Magnesium of 1.6.    CT head notable for the below: HEAD CT:  Development of a large intraparenchymal hematoma in the left parieto-occipital junction region measuring 6.1 x 3.9 x 5.2 cm (volume 65 cm^3). Acute subdural hematoma along the lateral convexity on the left, maximal thickness 1 cm. Mass effect with left-to-right shift of 1.6 cm. At MRI 1 week ago, there was low level hemorrhage in the location now occupied by the large hematoma. Findings at that time were hypothesized to relate to posterior reversible encephalopathy, but certainly septic  emboli were and are not excluded.    Patient emergently taken back to the OR for Craniotomy and evacuation of subdural hematoma.  Patient then transferred to ICU for stabilization.     03/28/21-  Patient with significant ICH s/p NSGY.  Vitals are stable this am. Had repeat CT head.  Family including mother and grandmother at bedside. I met with neurologist Dr Leonel Ramsay and neurosurgeon Dr Cari Caraway and consensus thus far is prognosis is very poor.  There is plan for family meeting today to review findings and medical plan.  His hb is <7 today.  He had some blood loss intracranially and some intra/post of expected bleeding. He remains on 40% PRVC. We will discuss potential transfusion post goals of care meeting with family.  04-09-21- Family is at bedside, plan today is for compassionate extubation with comfort measures. Spoke with Dr Leonel Ramsay neurology this am regarding plan and he was able to meet with family.  Palliative care team also met with family today.      Pertinent  Medical History  Endocarditis Postinfectious glomerulonephritis DVT   Significant Hospital Events: Including procedures, antibiotic start and stop dates in addition to other pertinent events   . Patient admitted to ICU for stabilization prior to transfer  Interim History / Subjective:  Not applicable  Objective   Blood pressure (!) 104/59, pulse 78, temperature 98.96 F (37.2 C), resp. rate 20, height '5\' 7"'  (1.702 m), weight 76.5 kg, SpO2 100 %.    Vent Mode: PRVC FiO2 (%):  [30 %] 30 % Set Rate:  [20  bmp] 20 bmp Vt Set:  [500 mL] 500 mL PEEP:  [5 cmH20] 5 cmH20   Intake/Output Summary (Last 24 hours) at Apr 10, 2021 1006 Last data filed at Apr 10, 2021 4782 Gross per 24 hour  Intake 4617.38 ml  Output 1710 ml  Net 2907.38 ml   Filed Weights   03/06/2021 1800 03/28/21 0316 04/10/2021 0312  Weight: 75.7 kg 74.1 kg 76.5 kg    Examination: General: Patient intubated and sedated HENT: ET tube in  place Lungs: CTAB. mechanical breath sounds appreciated Cardiovascular: RRR. No rubs or gallups Abdomen: Soft non distended Extremities: trace pitting edema in lower extremities Neuro: Pupils equally round with minima reaction to light. Extremely sluggish. Spontaneously breathing above rate. No gag. However patient had just received paralytic GU: Foley in place  Labs/imaging that I havepersonally reviewed  (right click and "Reselect all SmartList Selections" daily)  CT head from 03/06/2021 notes  Resolved Hospital Problem list   Not applicable  Assessment & Plan:  This is a 30 year old male with history of infective endocarditis who presents status postrepair with altered mental status.  Found to have intracerebral hemorrhage.   Intracerebral hemorrhage status post craniotomy and evacuation. -Monitor CBCs- Hb <7 on 03/28/21 -s/p goals of care discussion with family currently remains full  Code     Transfuse 2 uints prbc -Goal blood pressure less than 140.  -Continue hypertonic saline with goal of 150 meq -Neuro checks Q 1 hours -Neuro to consult on in AM -Poor prognosis. High likelihood to progress to brain death. Will need palliative on board to help with discussion and preparation.    Encephalopathy secondary to intracerebral hemorrhage and failure to protect airway -supportive care  -nsgy on case appreciate input  Endocarditis- -Continue ampicillin and ceftriaxone per outpatient regimen -TTE in AM  AKI-continue to monitor electrolytes however creatinine currently improved from previous -Strict I's and O's -Avoid contrast when able  Acute blood loss anemia  hb <7 -monitor vitals -transfuse post family meeting if they wish to continue full scope of therapy   Moderate protein calorie malnutrition  - poor prognostic sign  - optimize nourishment while critically ill   - palliative care consultation on case      Best practice (right click and "Reselect all SmartList  Selections" daily)  Diet:  NPO Pain/Anxiety/Delirium protocol (if indicated): Yes (RASS goal -2) VAP protocol (if indicated): Yes DVT prophylaxis: Contraindicated GI prophylaxis: H2B Glucose control: CBG q 4hour Central venous access: PICC line in place Arterial line: Present Foley:  Yes, and it is still needed Mobility:  bed rest  PT consulted: N/A Last date of multidisciplinary goals of care discussion  Code Status:  full code per mother Disposition: ICU  Labs   CBC: Recent Labs  Lab 03/25/21 0411 03/02/2021 1225 03/24/2021 1714 03/28/21 0458 03/28/21 2041 04-10-21 0353  WBC 8.5 16.9* 11.6* 8.0  --  7.6  NEUTROABS  --  11.0*  --   --   --   --   HGB 8.6* 10.7* 9.1* 6.9* 7.3* 7.2*  HCT 27.2* 33.0* 28.0* 21.2* 22.9* 23.0*  MCV 91.6 89.9 89.5 90.6  --  92.0  PLT 349 574* 366 303  --  956    Basic Metabolic Panel: Recent Labs  Lab 03/22/21 1657 03/23/21 0320 03/25/21 0411 03/30/2021 1225 03/25/2021 1300 03/17/2021 1714 03/20/2021 1927 03/28/21 0106 03/28/21 0458 03/28/21 1125 03/28/21 1812 03/28/21 2301 04-10-21 0008 04-10-2021 0353 Apr 10, 2021 0747  NA 135   < > 135 138   < >  142 143   < > 146*   < > 152* 170* 155* 157*  156* 157*  K 4.3   < > 4.1 3.2*  --  3.1* 3.4*  --  3.8  --   --   --   --  3.4*  --   CL 102   < > 102 95*  --  101  --   --  107  --   --   --   --  125*  --   CO2 25   < > 27 26  --  30  --   --  30  --   --   --   --  26  --   GLUCOSE 104*   < > 93 219*  --  147*  --   --  124*  --   --   --   --  112*  --   BUN 28*   < > 21* 23*  --  23*  --   --  18  --   --   --   --  19  --   CREATININE 1.68*   < > 1.47* 1.39*  --  1.23  --   --  1.25*  --   --   --   --  1.27*  --   CALCIUM 8.1*   < > 8.4* 8.8*  --  8.0*  --   --  7.6*  --   --   --   --  7.7*  --   MG 2.6*  --   --  1.6*  --   --  1.3*  --  3.2*  --   --   --   --  2.8*  --   PHOS  --   --   --   --   --   --  5.2*  --  5.0*  --   --   --   --  4.1  --    < > = values in this interval not  displayed.   GFR: Estimated Creatinine Clearance: 79.5 mL/min (A) (by C-G formula based on SCr of 1.27 mg/dL (H)). Recent Labs  Lab 03/19/2021 1225 03/28/2021 1714 03/13/2021 1927 03/28/21 0458 04/26/2021 0353  WBC 16.9* 11.6*  --  8.0 7.6  LATICACIDVEN  --  1.3 1.1  --   --     Liver Function Tests: Recent Labs  Lab 03/12/2021 1225 03/28/2021 1714  AST 45* 36  ALT 89* 74*  ALKPHOS 78 64  BILITOT 0.6 0.6  PROT 7.4 6.6  ALBUMIN 3.4* 3.2*   No results for input(s): LIPASE, AMYLASE in the last 168 hours. No results for input(s): AMMONIA in the last 168 hours.  ABG    Component Value Date/Time   PHART 7.53 (H) 03/28/2021 0514   PCO2ART 38 03/28/2021 0514   PO2ART 155 (H) 03/28/2021 0514   HCO3 31.8 (H) 03/28/2021 0514   TCO2 30 03/21/2021 1757   ACIDBASEDEF 5.9 (H) 03/06/2021 1818   O2SAT 99.5 03/28/2021 0514     Coagulation Profile: Recent Labs  Lab 03/11/2021 1225  INR 1.4*    Cardiac Enzymes: No results for input(s): CKTOTAL, CKMB, CKMBINDEX, TROPONINI in the last 168 hours.  HbA1C: Hgb A1c MFr Bld  Date/Time Value Ref Range Status  03/21/2021 03:28 AM 4.9 4.8 - 5.6 % Final    Comment:    (NOTE) Pre diabetes:  5.7%-6.4%  Diabetes:              >6.4%  Glycemic control for   <7.0% adults with diabetes     CBG: Recent Labs  Lab 03/28/21 1617 03/28/21 1913 03/28/21 2317 04/24/2021 0311 Apr 24, 2021 0727  GLUCAP 106* 112* 95 97 114*    Review of Systems:   Unable to attain at this time  Past Medical History:  He,  has a past medical history of Allergy, Bacterial endocarditis (03/05/2021), Headache, Postinfectious glomerulonephritis, and S/P mitral valve repair (03/21/2021).   Surgical History:   Past Surgical History:  Procedure Laterality Date  . CRANIOTOMY Left 03/02/2021   Procedure: CRANIOTOMY HEMATOMA EVACUATION SUBDURAL;  Surgeon: Meade Maw, MD;  Location: [facility];  Service: Neurosurgery;  Laterality: Left;  . MITRAL VALVE REPAIR N/A  03/21/2021   Procedure: MITRAL VALVE REPAIR (MVR) USING MEDTRONIC 28MM SIMUFORM SEMI-RIGID ANNULOPLASTY RING;  Surgeon: Rexene Alberts, MD;  Location: [place];  Service: Open Heart Surgery;  Laterality: N/A;  . TEE WITHOUT CARDIOVERSION N/A 03/06/2021   Procedure: TRANSESOPHAGEAL ECHOCARDIOGRAM (TEE);  Surgeon: Kate Sable, MD;  Location: [facility];  Service: Cardiovascular;  Laterality: N/A;  . TEE WITHOUT CARDIOVERSION N/A 03/21/2021   Procedure: TRANSESOPHAGEAL ECHOCARDIOGRAM (TEE);  Surgeon: Rexene Alberts, MD;  Location: [place];  Service: Open Heart Surgery;  Laterality: N/A;     Social History:   reports that he has been smoking cigarettes. He has been smoking about 0.75 packs per day. He has never used smokeless tobacco. He reports current alcohol use. He reports current drug use. Drug: Marijuana.   Family History:  His family history includes Hypertension in his mother.   Allergies No Known Allergies   Home Medications  Prior to Admission medications   Medication Sig Start Date End Date Taking? Authorizing Provider  acetaminophen (TYLENOL) 325 MG tablet Take 2 tablets (650 mg total) by mouth every 6 (six) hours as needed for mild pain, moderate pain, fever or headache (or Fever >/= 101). 03/11/21   Wyvonnia Dusky, MD  amLODipine (NORVASC) 10 MG tablet Take 1 tablet (10 mg total) by mouth daily. 03/26/21   Barrett, Erin R, PA-C  ampicillin IVPB Inject 12 g into the vein daily. As a continuous infusion. Indication: Endocarditis First Dose: yes Last Day of Therapy: 05/02/21 Labs - Once weekly:  CBC/D and BMP, Labs - Every other week:  ESR and CRP Method of administration: Ambulatory Pump (Continuous Infusion) Method of administration may be changed at the discretion of home infusion pharmacist based upon assessment of the patient and/or caregiver's ability to self-administer the medication ordered. 03/26/21 05/03/21  Barrett, Lodema Hong, PA-C  aspirin EC 325 MG EC tablet Take 1 tablet  (325 mg total) by mouth daily. 03/25/21   Barrett, Erin R, PA-C  cefTRIAXone (ROCEPHIN) IVPB Inject 2 g into the vein every 12 (twelve) hours. Indication: Endocarditis First Dose: yes Last Day of Therapy: 05/02/21 Labs - Once weekly:  CBC/D and BMP, Labs - Every other week:  ESR and CRP Method of administration: IV Push Method of administration may be changed at the discretion of home infusion pharmacist based upon assessment of the patient and/or caregiver's ability to self-administer the medication ordered. 03/26/21 05/03/21  Barrett, Erin R, PA-C  metoprolol tartrate (LOPRESSOR) 100 MG tablet Take 1 tablet (100 mg total) by mouth 2 (two) times daily. 03/26/21   Barrett, Erin R, PA-C  nicotine (NICODERM CQ - DOSED IN MG/24 HOURS) 14 mg/24hr patch Place 1  patch (14 mg total) onto the skin daily. 03/19/21   Sharen Hones, MD  oxyCODONE (OXY IR/ROXICODONE) 5 MG immediate release tablet Take 1 tablet (5 mg total) by mouth every 4 (four) hours as needed for severe pain. 03/26/21   Barrett, Erin R, PA-C  traZODone (DESYREL) 50 MG tablet Take 0.5 tablets (25 mg total) by mouth at bedtime as needed for sleep. 03/19/21   Sharen Hones, MD     Critical care provider statement:    Critical care time (minutes):  33   Critical care time was exclusive of:  Separately billable procedures and  treating other patients   Critical care was necessary to treat or prevent imminent or  life-threatening deterioration of the following conditions:  acute intracerebral hemmorage, acute blood loss anemia, endocarditis, acutely comatose   Critical care was time spent personally by me on the following  activities:  Development of treatment plan with patient or surrogate,  discussions with consultants, evaluation of patient's response to  treatment, examination of patient, obtaining history from patient or  surrogate, ordering and performing treatments and interventions, ordering  and review of laboratory studies and re-evaluation  of patient's condition   I assumed direction of critical care for this patient from another  provider in my specialty: no     Ottie Glazier, M.D.  Pulmonary & [place]

## 2021-03-31 NOTE — Progress Notes (Signed)
Daily Progress Note   Patient Name: Jerome Mccormick       Date: 03/04/2021 DOB: 12-Mar-1991  Age: 30 y.o. MRN#: [id number] Attending Physician: Vida Rigger, MD Primary Care Physician: Patient, No Pcp Per (Inactive) Admit Date: 31-Mar-2021  Reason for Consultation/Follow-up: Establishing goals of care  Subjective: No changes in patient, precedex added overnight and propofol increased per RN Multiple family members at bedside  Length of Stay: 2  Current Medications: Scheduled Meds:  . mouth rinse  15 mL Mouth Rinse 10 times per day    Continuous Infusions: . levETIRAcetam Stopped (03/23/2021 0117)  . LORazepam (ATIVAN) infusion 0.75 mg/hr (03/11/2021 1200)  . morphine 4 mg/hr (03/03/2021 1200)  . niCARDipine Stopped (03/22/2021 1043)  . propofol (DIPRIVAN) infusion 16.8 mcg/kg/min (03/20/2021 1303)    PRN Meds: acetaminophen, antiseptic oral rinse, glycopyrrolate **OR** glycopyrrolate **OR** glycopyrrolate, haloperidol **OR** haloperidol **OR** haloperidol lactate, LORazepam, morphine, ondansetron **OR** ondansetron (ZOFRAN) IV, polyvinyl alcohol  Physical Exam Constitutional:      Comments: unresponsive  Cardiovascular:     Rate and Rhythm: Normal rate and regular rhythm.  Pulmonary:     Comments: Full vent support Musculoskeletal:     Right lower leg: Edema present.     Left lower leg: Edema present.  Skin:    General: Skin is warm and dry.             Vital Signs: BP 112/71   Pulse 95   Temp 99.14 F (37.3 C)   Resp 20   Ht 5\' 7"  (1.702 m)   Wt 76.5 kg   SpO2 100%   BMI 26.41 kg/m  SpO2: SpO2: 100 % O2 Device: O2 Device: Ventilator O2 Flow Rate:    Intake/output summary:   Intake/Output Summary (Last 24 hours) at 03/23/2021 1305 Last data filed at 03/21/2021 1200 Gross per 24  hour  Intake 5016.25 ml  Output 1380 ml  Net 3636.25 ml   LBM: Last BM Date: 03/24/21 (per previous documentation) Baseline Weight: Weight: 75.7 kg Most recent weight: Weight: 76.5 kg       Palliative Assessment/Data: PPS 10%    Flowsheet Rows   Flowsheet Row Most Recent Value  Intake Tab   Referral Department Critical care  Unit at Time of Referral ICU  Palliative Care Primary Diagnosis Neurology  Date Notified 2021-03-31  Palliative Care Type New Palliative care  Date of Admission Mar 31, 2021  Date first seen by Palliative Care 03/28/21  # of days Palliative referral response time 1 Day(s)  # of days IP prior to Palliative referral 0  Clinical Assessment   Palliative Performance Scale Score 10%  Psychosocial & Spiritual Assessment   Palliative Care Outcomes   Patient/Family meeting held? Yes  Who was at the meeting? mult family members  Palliative Care Outcomes Clarified goals of care, Provided end of life care assistance, Provided psychosocial or spiritual support      Patient Active Problem List   Diagnosis Date Noted  . S/P craniotomy 03-31-21  . ICH (intracerebral hemorrhage) (HCC) 03/31/2021  . Post-infectious glomerulonephritis   . 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 S/P MVR (mitral valve repair) 03/21/2021  . Arm DVT (deep venous thromboembolism), acute, right (HCC)  03/19/2021  . Acute diastolic CHF (congestive heart failure) (HCC) 03/16/2021  . History of anemia due to chronic kidney disease 03/16/2021  . Nicotine dependence 03/16/2021  . Hyperkalemia 03/16/2021  . Acute glomerulonephritis syndrome 03/16/2021  . Mitral valve disease   . Cardiac murmur 03/05/2021  . 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 Nonrheumatic mitral valve regurgitation   . Anemia 03/03/2021  . AKI (acute kidney injury) (HCC) 03/02/2021    Palliative Care Assessment & Plan   HPI: 30 y.o. male  with past medical history of recent diagnosisof enterococcal endocarditis with  subsequent mitral valve repair with complex valvuloplasty on 03/21/2021, postinfectious glomerulonephritis, and DVT admitted on 03/18/2021 after being found down in floor. Patient required intubation in ED. Patient emergently taken back to the OR for craniotomy and evacuation of subdural hematoma. When sedation is stopped patient has tachycardia and myoclonus. There is concern he has suffered an irreversibly severe brain injury prior to presentation. PMT consulted to discuss GOC.  Assessment: F/u with RN - no changes other than increased sedation needs Family members at bedside including mother and grandmother. When I enter mother tells me they plan to move forward with compassionate extubation today - waiting for her brother to arrive later today. Participated in life review with family as they shared stories about Erion - how much his friends and family love him. Family shares about other hardships in their family.   Family asks what will happen during extubation - we discuss measures used to ensure Castulo's comfort. Discussed that we did not anticipate Sheridan to survive long off of ventilator. They express understanding.  Discuss shifting orders to comfort focused including DNR in anticipation of extubation - family agrees.   Recommendations/Plan:  Transition to ativan and morphine drip - wean propofol off, stop precedex  Morphine and ativan boluses as needed, PRN haldol  Robinul dose now  Anticipate extubation later today when family arrives and patient is comfortable on ativan and morphine infusions  Goals of Care and Additional Recommendations:  Limitations on Scope of Treatment: Full Comfort Care  Code Status:  DNR  Prognosis:   Hours - Days  Discharge Planning:  Anticipated Hospital Death  Care plan was discussed with family, Dr. Karna Christmas, RN  Thank you for allowing the Palliative Medicine Team to assist in the care of this patient.   Total Time 30 minutes Prolonged Time  Billed  no       Greater than 50%  of this time was spent counseling and coordinating care related to the above assessment and plan.  Gerlean Ren, DNP, [facility] Palliative Medicine Team Team Phone # [phone]  Pager [phone]

## 2021-03-31 NOTE — Progress Notes (Signed)
Continued support to family at bedside. Please contact Chaplain when extubation is done, if needed.

## 2021-03-31 NOTE — Death Summary Note (Incomplete)
DEATH SUMMARY   Patient Details  Name: Jerome Mccormick MRN: [id number] DOB: 04-26-1991  Admission/Discharge Information   Admit Date:  Apr 18, 2021  Date of Death: Date of Death: 20-Apr-2021  Time of Death: Time of Death: 04/29/1916  Length of Stay: 2  Referring Physician: Patient, No Pcp Per (Inactive)   Reason(s) for Hospitalization  Large left subdural hematoma and underlying intraparenchymal hemorrhage requiring emergent craniotomy.  Diagnoses  Preliminary cause of death:  Secondary Diagnoses (including complications and co-morbidities):  Active Problems:   AKI (acute kidney injury) (HCC)   S/P craniotomy   ICH (intracerebral hemorrhage) (HCC)   Acute blood loss anemia   Encephalopathy acute   Endocarditis   Acute respiratory failure [facility])   Brief Hospital Course (including significant findings, care, treatment, and services provided and events leading to death)  Jerome Mccormick is a 30 y.o. 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  Patient's postoperative course was uncomplicated and chest tubes were removed on postop day 3.  His OR cultures did grow staph capitis on mitral valve tissue and the patient was to have antibiotics with ampicillin running continuously with intermittent Rocephin dosage.  Patient was discharged from the hospital on 03/25/2021.  On 04-18-2021 the patient was in his own home and home health aide walked him to the bathroom that morning.  They noticed the patient was taking a long time in the bathroom and upon checking up on him he was found unresponsive and EMS was called.  The patient remained unresponsive and was intubated upon arrival to [facility] ED. head CT notable for a large left subdural hematoma and underlying intraparenchymal hemorrhage causing a mass-effect.  Patient underwent craniotomy and evacuation on 18-Apr-2021.  Post procedure patient  continued on 3% hypertonic saline and remained intubated requiring mechanical ventilation due to altered mental status and inability to protect airway.  Concerns for seizure activity when initially taken off of sedation treated with Keppra. Family consulted with Neurosurgery, Neurology  Pertinent Labs and Studies  Significant Diagnostic Studies CT ABDOMEN PELVIS WO CONTRAST  Result Date: 03/19/2021 CLINICAL DATA:  Abdominal pain. Renal failure. Lower extremity swelling. EXAM: CT ABDOMEN AND PELVIS WITHOUT CONTRAST TECHNIQUE: Multidetector CT imaging of the abdomen and pelvis was performed following the standard protocol without IV contrast. COMPARISON:  CT scan 03/06/2021 FINDINGS: Lower chest: Small bilateral pleural effusions and bibasilar atelectasis. The heart is normal in size. No pericardial effusion. Hepatobiliary: No hepatic lesions are identified without contrast. No intrahepatic biliary dilatation. The gallbladder is grossly normal. Pancreas: No mass, inflammation or ductal dilatation. Spleen: The spleen is mildly enlarged measuring 14.5 x 13.0 x 9.9 cm. Small low attenuations splenic lesions appears stable. Adrenals/Urinary Tract: Adrenal glands and kidneys are unremarkable. No renal calculi or hydronephrosis. Bladder is unremarkable. Stomach/Bowel: The stomach, duodenum, small bowel and colon are unremarkable. No acute inflammatory changes, mass lesions or obstructive findings. The terminal ileum is normal. The appendix is normal. Vascular/Lymphatic: The aorta is normal in caliber. No atheroscerlotic calcifications. No mesenteric of retroperitoneal mass or adenopathy. Small scattered lymph nodes are noted. Reproductive: The prostate gland and seminal vesicles are unremarkable. Other: Diffuse mesenteric edema without overt ascites. There is also severe diffuse body wall edema suggesting anasarca. Musculoskeletal: No significant bony findings. 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  IMPRESSION: 1. Small bilateral pleural effusions and bibasilar atelectasis. 2. Severe diffuse body wall edema suggesting anasarca. 3.  Mild splenomegaly. 4. Stable small low attenuation splenic lesions. 5. No acute abdominal/pelvic findings, mass lesions or adenopathy. Electronically Signed   By: Rudie Meyer M.D.   On: 03/19/2021 13:58   CT ABDOMEN PELVIS WO CONTRAST  Result Date: 03/06/2021 CLINICAL DATA:  Chronic diarrhea and weight loss. Left-sided abdominal pain for 1 week. Poor kidney function. EXAM: CT ABDOMEN AND PELVIS WITHOUT CONTRAST TECHNIQUE: Multidetector CT imaging of the abdomen and pelvis was performed following the standard protocol without IV contrast. COMPARISON:  03/02/2021 FINDINGS: Lower chest: The lung bases are clear. The heart size is normal. The intracardiac blood pool is hypodense relative to the adjacent myocardium consistent with anemia. Hepatobiliary: The liver is normal. Normal gallbladder.There is no biliary ductal dilation. Pancreas: Normal contours without ductal dilatation. No peripancreatic fluid collection. Spleen: There is a stable hypodense area in the splenic parenchyma towards the hilum. Adrenals/Urinary Tract: --Adrenal glands: Unremarkable. --Right kidney/ureter: No hydronephrosis or radiopaque kidney stones. --Left kidney/ureter: In the left perinephric space there is a new air and fluid collection abutting the lower pole of the left kidney. This collection measures approximately 5.9 cm across. This is likely a small perinephric hematoma in the setting of recent renal biopsy. --Urinary bladder: There is diffuse bladder wall thickening. Stomach/Bowel: --Stomach/Duodenum: No hiatal hernia or other gastric abnormality. Normal duodenal course and caliber. --Small bowel: There is mild wall thickening of several loops of small bowel in the mid left abdomen. --Colon: There appears to be mild wall thickening of the descending colon. --Appendix: Normal. Vascular/Lymphatic:  Normal course and caliber of the major abdominal vessels. --No retroperitoneal lymphadenopathy. --No mesenteric lymphadenopathy. --No pelvic or inguinal lymphadenopathy. Reproductive: Unremarkable Other: There is a small volume of free fluid in the abdomen and pelvis. There is anasarca. Musculoskeletal. 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 IMPRESSION: 1. New collection about the left kidney favored to represent a small perinephric hematoma in the setting of recent renal biopsy. 2. Anemia. 3. Anasarca. 4. No specific abnormality identified to explain the patient's symptoms. Electronically Signed   By: Katherine Mantle M.D.   On: 03/06/2021 19:47   CT ANGIO HEAD W OR WO CONTRAST  Result Date: 03/28/2021 CLINICAL DATA:  Acute intracranial hemorrhage. Assess for mycotic aneurysm. EXAM: CT ANGIOGRAPHY HEAD TECHNIQUE: Multidetector CT imaging of the head was performed using the standard protocol during bolus administration of intravenous contrast. Multiplanar CT image reconstructions and MIPs were obtained to evaluate the vascular anatomy. CONTRAST:  75mL OMNIPAQUE IOHEXOL 350 MG/ML SOLN COMPARISON:  Head CT yesterday. FINDINGS: CT HEAD Brain: Interval left craniotomy for evacuation of left subdural hematoma. Good of Accu a shin with only a small amount of residual subdural blood and air. Less mass effect, with left-to-right shift now measuring 11 mm compared with 16 mm yesterday. Left parieto-occipital intraparenchymal hematoma today measures 7.4 x 4.1 x 5.5 cm (volume = 87 cm^3), increased from yesterday where the volume was at approximately 65 cc. Mild surrounding edema. There is been intraventricular penetration, blood now present within the left lateral ventricle and the third ventricle, with a small amount layering in the occipital horn of the right lateral ventricle. The ventricles are mildly dilated compared to the baseline study of 03/02/2021,  asymmetrically more on the left than the right. Vascular: No vascular finding evident on the noncontrast portion of the study. Skull: Left craniotomy as noted above. Sinuses: Clear Other: None CTA HEAD Anterior circulation: Both internal carotid arteries are patent through the skull  base and siphon regions. The anterior and middle cerebral vessels appear normal without evidence of large or medium vessel occlusion, aneurysm or vascular malformation. Posterior circulation: Both vertebral arteries are patent through the foramen magnum to the basilar. No basilar stenosis or aneurysm. Posterior circulation branch vessels appear normal. There is no definable high flow vascular abnormality or aneurysm in the region the posterior brain hemorrhage on the left. Venous sinuses: Superior sagittal sinus appears normal. Scan timing is not adequate to evaluate the transverse sinuses and sigmoid sinuses. Anatomic variants: None other significant. Review of the MIP images confirms the above findings. IMPRESSION: 1. Interval left craniotomy for evacuation of left subdural hematoma. Good evacuation with only a small amount of residual subdural blood and air. Less mass effect, with left-to-right shift now measuring 11 mm compared with 16 mm yesterday. 2. Left parieto-occipital intraparenchymal hematoma today measures 7.4 x 4.1 x 5.5 cm, estimated volume 87 cc, increased from yesterday where the volume was at approximately 65 cc. Mild surrounding edema. 3. Interval intraventricular penetration, blood now present within the left lateral ventricle and the third ventricle, with a small amount layering in the occipital horn of the right lateral ventricle. The ventricles are mildly dilated compared to the baseline study of 03/02/2021, asymmetrically more on the left than the right. 4. No definable high flow vascular abnormality or aneurysm in the region the posterior brain hemorrhage on the left, or elsewhere. Electronically Signed   By:  Paulina Fusi M.D.   On: 03/28/2021 09:01   DG Orthopantogram  Result Date: 03/20/2021 CLINICAL DATA:  Poor dentition EXAM: ORTHOPANTOGRAM/PANORAMIC COMPARISON:  None. FINDINGS: Panoramic view of the mandible was obtained and reveals no evidence of acute fracture. Temporomandibular joints are within normal limits. No periapical lucency is identified to suggest abscess formation. Multiple amalgam fillings are noted. No significant dental caries are noted on this image. IMPRESSION: No acute abnormality noted. No evidence of periapical abscess. Electronically Signed   By: Alcide Clever M.D.   On: 03/20/2021 18:35   DG Chest 2 View  Result Date: 03/25/2021 CLINICAL DATA:  Chest tube removal. EXAM: CHEST - 2 VIEW COMPARISON:  03/24/2021 FINDINGS: 0603 hours. Low lung volumes. Bibasilar atelectasis noted with tiny bilateral effusions. Left chest tube and mediastinal/pericardial drains removed in the interval. No evidence for persistent pneumothorax. The cardio pericardial silhouette is enlarged. Right PICC line remains in place. IMPRESSION: Interval removal of chest tube and mediastinal/pericardial drains. No evidence for persistent pneumothorax. Electronically Signed   By: Kennith Center M.D.   On: 03/25/2021 08:42   DG Chest 2 View  Result Date: 03/20/2021 CLINICAL DATA:  Poor dentition EXAM: CHEST - 2 VIEW COMPARISON:  03/16/2021 FINDINGS: Cardiac shadow is within normal limits. Linear atelectatic changes are noted in the left mid lung. PICC line is noted with catheter tip at the cavoatrial junction. No focal confluent infiltrate or sizable effusion is seen. No bony abnormality is noted. IMPRESSION: Mild left midlung atelectasis. PICC line stable in appearance as described. Electronically Signed   By: Alcide Clever M.D.   On: 03/20/2021 18:33   DG Chest 2 View  Result Date: 03/16/2021 CLINICAL DATA:  30 year old male with shortness of breath. Lower extremity swelling. EXAM: CHEST - 2 VIEW COMPARISON:  CT  Chest, Abdomen, and Pelvis 07/02/2012 and earlier. FINDINGS: Right side PICC line in place. Lower lung volumes compared to 2009. Cardiac size and mediastinal contours remain normal. Visualized tracheal air column is within normal limits. No pneumothorax, pleural effusion or consolidation.  But mild to moderate diffuse increased pulmonary interstitial opacity with a basilar predominance compared to 2009. No acute osseous abnormality identified. Negative visible bowel gas pattern. IMPRESSION: 1. Lower lung volumes with diffusely increased increased pulmonary interstitial opacity. Favor pulmonary interstitial edema in light of lower extremity swelling. Otherwise consider viral/atypical respiratory infection. 2. No pleural effusion.  PICC line in place. Electronically Signed   By: Odessa Fleming M.D.   On: 03/16/2021 07:44   CT Head Wo Contrast  Result Date: 04-03-21 CLINICAL DATA:  Found unconscious in the bathroom. Abnormal posture. Endocarditis and headache. EXAM: CT HEAD WITHOUT CONTRAST CT CERVICAL SPINE WITHOUT CONTRAST TECHNIQUE: Multidetector CT imaging of the head and cervical spine was performed following the standard protocol without intravenous contrast. Multiplanar CT image reconstructions of the cervical spine were also generated. COMPARISON:  MRI 03/20/2021 FINDINGS: CT HEAD FINDINGS Brain: Development of a large intraparenchymal hematoma in the left parieto-occipital junction region measuring 6.1 x 3.9 x 5.2 cm (volume = 65 cm^3). Small amount of surrounding edema. Patient also has an acute subdural hematoma along the lateral convexity on the left, maximal thickness 1 cm. There is mass effect with left-to-right shift 1.6 cm. Question early trapping of the right lateral ventricle. Vascular: No primary vascular finding. Skull: Normal Sinuses/Orbits: Clear/normal Other: None CT CERVICAL SPINE FINDINGS Alignment: No malalignment.  Head bent forward. Skull base and vertebrae: Normal Soft tissues and spinal  canal: Normal.  Endotracheal intubation. Disc levels:  Normal Upper chest: Not included. Other: None IMPRESSION: HEAD CT: Development of a large intraparenchymal hematoma in the left parieto-occipital junction region measuring 6.1 x 3.9 x 5.2 cm (volume 65 cm^3). Acute subdural hematoma along the lateral convexity on the left, maximal thickness 1 cm. Mass effect with left-to-right shift of 1.6 cm. At MRI 1 week ago, there was low level hemorrhage in the location now occupied by the large hematoma. Findings at that time were hypothesized to relate to posterior reversible encephalopathy, but certainly septic emboli were and are not excluded. Critical Value/emergent results were called by telephone at the time of interpretation on 04-03-2021 at 12:53 pm to provider [facility] , who verbally acknowledged these results. CERVICAL SPINE CT: No acute or traumatic finding. Head bent forward. Electronically Signed   By: Paulina Fusi M.D.   On: 2021-04-03 12:58   CT Head Wo Contrast  Result Date: 03/02/2021 CLINICAL DATA:  Headache. EXAM: CT HEAD WITHOUT CONTRAST TECHNIQUE: Contiguous axial images were obtained from the base of the skull through the vertex without intravenous contrast. COMPARISON:  12/15/2017. FINDINGS: Brain: No evidence of acute infarction, hemorrhage, hydrocephalus, extra-axial collection or mass lesion/mass effect. 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 Vascular: No hyperdense vessel identified. Skull: No acute fracture. Sinuses/Orbits: Minimal ethmoid air cell mucosal thickening. No air-fluid levels visualized. Unremarkable visualized orbits. Other: No mastoid effusion. IMPRESSION: No evidence of acute intracranial abnormality. Electronically Signed   By: Feliberto Harts MD   On: 03/02/2021 16:30   CT Cervical Spine Wo Contrast  Result Date: 04/03/21 CLINICAL DATA:  Found unconscious in the bathroom. Abnormal posture. Endocarditis and headache.  EXAM: CT HEAD WITHOUT CONTRAST CT CERVICAL SPINE WITHOUT CONTRAST TECHNIQUE: Multidetector CT imaging of the head and cervical spine was performed following the standard protocol without intravenous contrast. Multiplanar CT image reconstructions of the cervical spine were also generated. COMPARISON:  MRI 03/20/2021 FINDINGS: CT HEAD FINDINGS Brain: Development of a large intraparenchymal hematoma in the left parieto-occipital junction region measuring  6.1 x 3.9 x 5.2 cm (volume = 65 cm^3). Small amount of surrounding edema. Patient also has an acute subdural hematoma along the lateral convexity on the left, maximal thickness 1 cm. There is mass effect with left-to-right shift 1.6 cm. Question early trapping of the right lateral ventricle. Vascular: No primary vascular finding. Skull: Normal Sinuses/Orbits: Clear/normal Other: None CT CERVICAL SPINE FINDINGS Alignment: No malalignment.  Head bent forward. Skull base and vertebrae: Normal Soft tissues and spinal canal: Normal.  Endotracheal intubation. Disc levels:  Normal Upper chest: Not included. Other: None IMPRESSION: HEAD CT: Development of a large intraparenchymal hematoma in the left parieto-occipital junction region measuring 6.1 x 3.9 x 5.2 cm (volume 65 cm^3). Acute subdural hematoma along the lateral convexity on the left, maximal thickness 1 cm. Mass effect with left-to-right shift of 1.6 cm. At MRI 1 week ago, there was low level hemorrhage in the location now occupied by the large hematoma. Findings at that time were hypothesized to relate to posterior reversible encephalopathy, but certainly septic emboli were and are not excluded. Critical Value/emergent results were called by telephone at the time of interpretation on 04-11-21 at 12:53 pm to provider [facility] , who verbally acknowledged these results. CERVICAL SPINE CT: No acute or traumatic finding. Head bent forward. Electronically Signed   By: Paulina Fusi M.D.   On: 2021/04/11 12:58   MR  BRAIN W WO CONTRAST  Result Date: 03/20/2021 CLINICAL DATA:  Endocarditis and headache. EXAM: MRI HEAD WITHOUT AND WITH CONTRAST TECHNIQUE: Multiplanar, multiecho pulse sequences of the brain and surrounding structures were obtained without and with intravenous contrast. CONTRAST:  8mL GADAVIST GADOBUTROL 1 MMOL/ML IV SOLN COMPARISON:  Head CT 03/02/2021 FINDINGS: Brain: The brainstem and cerebellum are normal. Within both occipital regions, there are small regions of edema. This edema affects the cortical and subcortical brain and is more extensive on the left than the right. Petechial blood products are present in both regions. No mass effect or shift. Cerebral hemispheres are otherwise normal. No restricted diffusion. No hydrocephalus or extra-axial collection. 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 In this patient with possible bacterial endocarditis, the possibility septic emboli does exist but seems less likely. There is a punctate focus of enhancement in the right occipital region but no other abnormal enhancement in those regions or elsewhere. Certainly, we often seen minor changes of enhancement in cases of posterior reversible encephalopathy. Vascular: Major vessels at the base of the brain show flow. Skull and upper cervical spine: Negative Sinuses/Orbits: Clear/normal Other: None IMPRESSION: Abnormal edema affecting the cortical and subcortical brain in both occipital regions, more extensive on the left than the right. Petechial blood products without mass effect or shift. 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 In this patient with possible bacterial endocarditis, the possibility of septic emboli does exist but seems less likely. See above full discussion. Electronically Signed   By: Paulina Fusi M.D.   On: 03/20/2021 19:13   US Venous Img Lower Bilateral (DVT)  Result Date: 03/16/2021 CLINICAL DATA:  Generalized  edema for 2 weeks. EXAM: BILATERAL LOWER EXTREMITY VENOUS DOPPLER ULTRASOUND TECHNIQUE: Gray-scale sonography with compression, as well as color and duplex ultrasound, were performed to evaluate the deep venous system(s) from the level of the common femoral vein through the popliteal and proximal calf veins. COMPARISON:  None. FINDINGS: VENOUS Normal compressibility of the common femoral, superficial femoral, and popliteal veins, as well as the visualized calf  veins. Visualized portions of profunda femoral vein and great saphenous vein unremarkable. No filling defects to suggest DVT on grayscale or color Doppler imaging. Doppler waveforms show normal direction of venous flow, normal respiratory plasticity and response to augmentation. OTHER None. Limitations: none IMPRESSION: Negative for DVT in the bilateral lower extremities. Electronically Signed   By: Emmaline Kluver M.D.   On: 03/16/2021 12:35   US Venous Img Upper Uni Right(DVT)  Result Date: 03/17/2021 CLINICAL DATA:  Pain and swelling for 1 day EXAM: RIGHT UPPER EXTREMITY VENOUS DOPPLER ULTRASOUND TECHNIQUE: Gray-scale sonography with graded compression, as well as color Doppler and duplex ultrasound were performed to evaluate the upper extremity deep venous system from the level of the subclavian vein and including the jugular, axillary, basilic, radial, ulnar and upper cephalic vein. Spectral Doppler was utilized to evaluate flow at rest and with distal augmentation maneuvers. COMPARISON:  None. FINDINGS: Contralateral Subclavian Vein: Respiratory phasicity is normal and symmetric with the symptomatic side. No evidence of thrombus. Normal compressibility. Internal Jugular Vein: No evidence of thrombus. Normal compressibility, respiratory phasicity and response to augmentation. Subclavian Vein: No evidence of thrombus. Normal compressibility, respiratory phasicity and response to augmentation. Axillary Vein: No evidence of thrombus. Normal  compressibility, respiratory phasicity and response to augmentation. Cephalic Vein: Partially occlusive thrombus noted in the distal upper arm. Basilic Vein: No evidence of thrombus. Normal compressibility, respiratory phasicity and response to augmentation. Brachial Veins: Small amount of nonocclusive thrombus noted in the right brachial vein around the PICC. Radial Veins: No evidence of thrombus. Normal compressibility, respiratory phasicity and response to augmentation. Ulnar Veins: No evidence of thrombus. Normal compressibility, respiratory phasicity and response to augmentation. Venous Reflux:  None visualized. Other Findings:  None visualized. IMPRESSION: 1. Small amount of nonocclusive thrombus noted in the distal upper arm cephalic vein. 2. Small amount of nonocclusive thrombus noted adjacent to PICC in the brachial vein in the mid upper arm. Electronically Signed   By: Acquanetta Belling M.D.   On: 03/17/2021 08:51   DG Chest Port 1 View  Result Date: 04-04-2021 CLINICAL DATA:  Hypoxia EXAM: PORTABLE CHEST 1 VIEW COMPARISON:  Apr 04, 2021 study obtained earlier in the day FINDINGS: Endotracheal tube tip is 3.1 cm above the carina. Nasogastric tube tip and side port are below the diaphragm. Central catheter tip is in the superior vena cava near the cavoatrial junction. No pneumothorax. There is ill-defined airspace opacity in the right lower lobe with left base atelectasis and small left pleural effusion. Heart is upper normal in size with pulmonary vascularity within normal limits. Prosthetic valve present. No adenopathy. No bone lesions. IMPRESSION: Tube and catheter positions as described without pneumothorax. Airspace opacity right lower lobe consistent with pneumonia or potential aspiration. Left base atelectasis noted. Small left pleural effusion. Stable cardiac silhouette. Electronically Signed   By: Bretta Bang III M.D.   On: 04/04/21 17:23   DG Chest Portable 1 View  Result Date:  04-04-2021 CLINICAL DATA:  Post intubation. EXAM: PORTABLE CHEST 1 VIEW COMPARISON:  Radiograph 03/26/2019 FINDINGS: Endotracheal tube 3.5 cm from carina in good position. RIGHT PICC line noted. LEFT basilar atelectasis small LEFT effusion noted. Prosthetic heart valve noted. IMPRESSION: Endotracheal tube in good position. LEFT basilar atelectasis and effusion. Electronically Signed   By: Genevive Bi M.D.   On: Apr 04, 2021 13:44   DG CHEST PORT 1 VIEW  Result Date: 03/24/2021 CLINICAL DATA:  Chest tube placement. Status post mitral valve repair EXAM: PORTABLE CHEST 1 VIEW COMPARISON:  03/23/2021 . FINDINGS: Right arm PICC line tip is in the distal SVC. Mediastinal drain and left chest tubes are in place. No pneumothorax visualized. Stable cardiomediastinal contours. Small right pleural effusion is noted. Linear areas of atelectasis are noted within the left midlung and left base. IMPRESSION: 1. Stable exam. No pneumothorax visualized. 2. Small right pleural effusion. Electronically Signed   By: Signa Kell M.D.   On: 03/24/2021 07:48   DG Chest Port 1 View  Result Date: 03/23/2021 CLINICAL DATA:  Atelectasis, follow-up EXAM: PORTABLE CHEST 1 VIEW COMPARISON:  03/22/2021 FINDINGS: Removal of Swan-Ganz catheter. Left IJ Cordis remains in place. Right arm PICC line tip is in the SVC. Mitral valve prosthesis. Mediastinal drain and left chest tubes are again noted. No pneumothorax. Decreased lung volumes with left base atelectasis is similar to previous exam. No interstitial edema. IMPRESSION: No change in low lung volumes with left base atelectasis. Electronically Signed   By: Signa Kell M.D.   On: 03/23/2021 08:08   DG Chest Port 1 View  Result Date: 03/22/2021 CLINICAL DATA:  Follow-up valve replacement EXAM: PORTABLE CHEST 1 VIEW COMPARISON:  03/21/2021 FINDINGS: Prosthetic mitral valve in place. Endotracheal tube and nasogastric tube have been removed. Mediastinal drain and left chest tube  remain in place. No pneumothorax. Right arm PICC tip in the SVC above the right atrium. Swan-Ganz catheter tip in the main pulmonary artery or proximal left pulmonary artery. Slight worsening atelectasis in the lower lobes following extubation, left more than right. Gaseous distension of the stomach. IMPRESSION: 1. Lines and tubes well positioned. No pneumothorax. 2. Slight worsening of atelectasis in the lower lobes following extubation. 3. Gaseous distension of the stomach. Electronically Signed   By: Paulina Fusi M.D.   On: 03/22/2021 08:26   DG Chest Port 1 View  Result Date: 03/21/2021 CLINICAL DATA:  Status post mitral valve repair. EXAM: PORTABLE CHEST 1 VIEW COMPARISON:  Chest radiograph from one day prior. FINDINGS: Endotracheal tube tip is 4.7 cm above the carina. Enteric tube enters stomach with the tip not seen on this image. Right PICC terminates over the cavoatrial junction. Left internal jugular Swan-Ganz catheter terminates over the right lower lung. Mitral annuloplasty ring in place. Mediastinal drains and left chest tube are in place. Stable cardiomediastinal silhouette with mild cardiomegaly. Probable tiny 1-2% left apical pneumothorax. No right pneumothorax. No pleural effusion. Borderline mild pulmonary edema. Mild left basilar atelectasis. IMPRESSION: 1. Left internal jugular Swan-Ganz catheter terminates within a right lower lobe pulmonary artery branch, consider retracting 6-8 cm. Otherwise well-positioned support structures. 2. Probable tiny 1-2% left apical pneumothorax. Left chest tube in place. 3. Borderline mild congestive heart failure. These results will be called to the ordering clinician or representative by the Radiologist Assistant, and communication documented in the PACS or [employer]. Electronically Signed   By: Delbert Phenix M.D.   On: 03/21/2021 14:28   DG Abd Portable 1V  Result Date: 04-Apr-2021 CLINICAL DATA:  Orogastric tube placement EXAM: PORTABLE ABDOMEN - 1  VIEW COMPARISON:  None. FINDINGS: Orogastric tube tip and side port in stomach. Moderate stool in colon. No bowel dilatation or air-fluid level to suggest bowel obstruction. No free air. IMPRESSION: Orogastric tube tip and side port in stomach. No bowel obstruction or free air. Moderate stool in colon. Electronically Signed   By: Bretta Bang III M.D.   On: Apr 04, 2021 17:23   ECHOCARDIOGRAM COMPLETE  Result Date: 03/18/2021    ECHOCARDIOGRAM REPORT   Patient Name:  Jerome Mccormick Date of Exam: 03/18/2021 Medical Rec #:  [id number]      Height:       67.0 in Accession #:    [id number]     Weight:       179.8 lb Date of Birth:  Apr 23, 1991      BSA:          1.933 m Patient Age:    30 years       BP:           130/92 mmHg Patient Gender: M              HR:           78 bpm. Exam Location:  [facility] Procedure: 2D Echo, Color Doppler and Cardiac Doppler Indications:     I34.0 Mitral valve insufficiency  History:         Patient has prior history of Echocardiogram examinations, most                  recent 03/06/2021. Mitral Valve Disease.  Sonographer:     Humphrey Rolls RDCS (AE) Referring Phys:  [id number] Lennon Alstrom Diagnosing Phys: Lorine Bears MD IMPRESSIONS  1. Left ventricular ejection fraction, by estimation, is 60 to 65%. The left ventricle has normal function. The left ventricle has no regional wall motion abnormalities. The left ventricular internal cavity size was mildly dilated. Left ventricular diastolic parameters are indeterminate.  2. Right ventricular systolic function is normal. The right ventricular size is normal. There is mildly elevated pulmonary artery systolic pressure. The estimated right ventricular systolic pressure is 44.2 mmHg.  3. Left atrial size was mildly dilated.  4. The pericardial effusion is circumferential.  5. The mitral valve is abnormal. 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 Moderate to severe mitral valve regurgitation. No evidence of mitral stenosis. There is  mild holosystolic prolapse of both leaflets of the mitral valve.  6. The aortic valve is normal in structure. Aortic valve regurgitation is not visualized. No aortic stenosis is present.  7. The inferior vena cava is normal in size with greater than 50% respiratory variability, suggesting right atrial pressure of 3 mmHg. FINDINGS  Left Ventricle: Left ventricular ejection fraction, by estimation, is 60 to 65%. The left ventricle has normal function. The left ventricle has no regional wall motion abnormalities. The left ventricular internal cavity size was mildly dilated. There is  no left ventricular hypertrophy. Left ventricular diastolic parameters are indeterminate. Right Ventricle: The right ventricular size is normal. No increase in right ventricular wall thickness. Right ventricular systolic function is normal. There is mildly elevated pulmonary artery systolic pressure. The tricuspid regurgitant velocity is 3.21  m/s, and with an assumed right atrial pressure of 3 mmHg, the estimated right ventricular systolic pressure is 44.2 mmHg. Left Atrium: Left atrial size was mildly dilated. Right Atrium: Right atrial size was normal in size. Pericardium: Trivial pericardial effusion is present. The pericardial effusion is circumferential. Mitral Valve: The mitral valve is abnormal. 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 There is moderate thickening of the mitral valve leaflet(s). Moderate to severe mitral valve regurgitation. No evidence of mitral valve stenosis. MV peak gradient, 6.2 mmHg. The mean mitral valve gradient is 4.0 mmHg. Tricuspid Valve: The tricuspid valve is normal in structure. Tricuspid valve regurgitation is mild . No evidence of tricuspid stenosis. Aortic Valve: The aortic valve is normal in structure. Aortic valve regurgitation is not visualized. No  aortic stenosis is present. Aortic valve mean gradient measures 4.0 mmHg. Aortic valve peak gradient measures 8.0  mmHg. Aortic valve area, by VTI measures 3.03 cm. Pulmonic Valve: The pulmonic valve was normal in structure. Pulmonic valve regurgitation is mild. No evidence of pulmonic stenosis. Aorta: The aortic root is normal in size and structure. Venous: The inferior vena cava is normal in size with greater than 50% respiratory variability, suggesting right atrial pressure of 3 mmHg. IAS/Shunts: No atrial level shunt detected by color flow Doppler.  LEFT VENTRICLE PLAX 2D LVIDd:         5.30 cm  Diastology LVIDs:         3.50 cm  LV e' medial:    9.46 cm/s LV PW:         1.00 cm  LV E/e' medial:  14.3 LV IVS:        0.80 cm  LV e' lateral:   16.80 cm/s LVOT diam:     2.10 cm  LV E/e' lateral: 8.0 LV SV:         83 LV SV Index:   43 LVOT Area:     3.46 cm  RIGHT VENTRICLE RV Basal diam:  2.80 cm TAPSE (M-mode): 3.4 cm LEFT ATRIUM             Index       RIGHT ATRIUM           Index LA diam:        4.00 cm 2.07 cm/m  RA Area:     15.10 cm LA Vol (A2C):   55.5 ml 28.72 ml/m RA Volume:   31.80 ml  16.45 ml/m LA Vol (A4C):   73.5 ml 38.03 ml/m LA Biplane Vol: 63.9 ml 33.06 ml/m  AORTIC VALVE                   PULMONIC VALVE AV Area (Vmax):    2.95 cm    PV Vmax:       0.85 m/s AV Area (Vmean):   2.94 cm    PV Vmean:      58.000 cm/s AV Area (VTI):     3.03 cm    PV VTI:        0.204 m AV Vmax:           141.00 cm/s PV Peak grad:  2.9 mmHg AV Vmean:          99.400 cm/s PV Mean grad:  1.0 mmHg AV VTI:            0.273 m AV Peak Grad:      8.0 mmHg AV Mean Grad:      4.0 mmHg LVOT Vmax:         120.00 cm/s LVOT Vmean:        84.300 cm/s LVOT VTI:          0.239 m LVOT/AV VTI ratio: 0.88  AORTA Ao Root diam: 3.30 cm MITRAL VALVE                TRICUSPID VALVE MV Area (PHT): 4.29 cm     TR Peak grad:   41.2 mmHg MV Area VTI:   2.71 cm     TR Vmax:        321.00 cm/s MV Peak grad:  6.2 mmHg MV Mean grad:  4.0 mmHg     SHUNTS MV Vmax:       1.25 m/s     Systemic  VTI:  0.24 m MV Vmean:      90.6 cm/s    Systemic Diam: 2.10  cm MV Decel Time: 177 msec MV E velocity: 135.00 cm/s MV A velocity: 95.40 cm/s MV E/A ratio:  1.42 Lorine Bears MD Electronically signed by Lorine Bears MD Signature Date/Time: 03/18/2021/2:50:06 PM    Final    ECHOCARDIOGRAM COMPLETE  Result Date: 03/03/2021    ECHOCARDIOGRAM REPORT   Patient Name:   Jerome Mccormick Date of Exam: 03/03/2021 Medical Rec #:  [id number]      Height:       67.0 in Accession #:    [id number]     Weight:       138.0 lb Date of Birth:  [birth year]      BSA:          1.727 m Patient Age:    30 years       BP:           114/79 mmHg Patient Gender: M              HR:           88 bpm. Exam Location:  [facility] Procedure: 2D Echo, Cardiac Doppler and Color Doppler Indications:     Murmur R01.1  History:         Patient has no prior history of Echocardiogram examinations.  Sonographer:     Neysa Bonito Roar Referring Phys:  [id number] Venora Maples Diagnosing Phys: Julien Nordmann MD IMPRESSIONS  1. Left ventricular ejection fraction, by estimation, is 60 to 65%. The left ventricle has normal function. The left ventricle has no regional wall motion abnormalities. Left ventricular diastolic parameters were normal.  2. Right ventricular systolic function is normal. The right ventricular size is normal. There is mildly elevated pulmonary artery systolic pressure. The estimated right ventricular systolic pressure is 38.3 mmHg.  3. Left atrial size was moderately dilated.  4. The mitral valve is abnormal.Posterior leaflet with prolapse, unable to exclude partially flail leaflet or torn chordea (images 30-32) Moderate eccentric mitral valve regurgitation. No evidence of mitral stenosis.  5. The inferior vena cava is dilated in size with >50% respiratory variability, suggesting right atrial pressure of 8 mmHg. FINDINGS  Left Ventricle: Left ventricular ejection fraction, by estimation, is 60 to 65%. The left ventricle has normal function. The left ventricle has no regional wall motion abnormalities. The left  ventricular internal cavity size was normal in size. There is  no left ventricular hypertrophy. Left ventricular diastolic parameters were normal. Right Ventricle: The right ventricular size is normal. No increase in right ventricular wall thickness. Right ventricular systolic function is normal. There is mildly elevated pulmonary artery systolic pressure. The tricuspid regurgitant velocity is 2.66  m/s, and with an assumed right atrial pressure of 10 mmHg, the estimated right ventricular systolic pressure is 38.3 mmHg. Left Atrium: Left atrial size was moderately dilated. Right Atrium: Right atrial size was normal in size. Pericardium: There is no evidence of pericardial effusion. Mitral Valve: The mitral valve is abnormal.Posterior leaflet with prolapse, unable to exclude partially flail leaflet or torn chordea (images 30-32)Moderate mitral valve regurgitation. No evidence of mitral valve stenosis. Tricuspid Valve: The tricuspid valve is normal in structure. Tricuspid valve regurgitation is mild . No evidence of tricuspid stenosis. Aortic Valve: The aortic valve is normal in structure. Aortic valve regurgitation is not visualized. No aortic stenosis is present. Aortic valve peak gradient measures 7.2 mmHg. Pulmonic Valve: The pulmonic valve was normal  in structure. Pulmonic valve regurgitation is not visualized. No evidence of pulmonic stenosis. Aorta: The aortic root is normal in size and structure. Venous: The inferior vena cava is dilated in size with greater than 50% respiratory variability, suggesting right atrial pressure of 8 mmHg. IAS/Shunts: No atrial level shunt detected by color flow Doppler.  LEFT VENTRICLE PLAX 2D LVIDd:         4.86 cm  Diastology LVIDs:         3.55 cm  LV e' medial:    12.40 cm/s LV PW:         1.10 cm  LV E/e' medial:  11.5 LV IVS:        0.95 cm  LV e' lateral:   17.30 cm/s LVOT diam:     2.00 cm  LV E/e' lateral: 8.2 LVOT Area:     3.14 cm  RIGHT VENTRICLE RV Mid diam:    3.43  cm RV S prime:     15.60 cm/s TAPSE (M-mode): 3.4 cm LEFT ATRIUM           Index       RIGHT ATRIUM           Index LA diam:      4.30 cm 2.49 cm/m  RA Area:     17.80 cm LA Vol (A2C): 97.9 ml 56.68 ml/m RA Volume:   50.50 ml  29.24 ml/m LA Vol (A4C): 52.4 ml 30.34 ml/m  AORTIC VALVE                PULMONIC VALVE AV Area (Vmax): 2.95 cm    PV Vmax:        1.18 m/s AV Vmax:        134.00 cm/s PV Peak grad:   5.6 mmHg AV Peak Grad:   7.2 mmHg    RVOT Peak grad: 1 mmHg LVOT Vmax:      126.00 cm/s  AORTA Ao Root diam: 2.70 cm MITRAL VALVE                TRICUSPID VALVE MV Area (PHT): 3.02 cm     TR Peak grad:   28.3 mmHg MV Decel Time: 251 msec     TR Vmax:        266.00 cm/s MV E velocity: 142.00 cm/s MV A velocity: 103.00 cm/s  SHUNTS MV E/A ratio:  1.38         Systemic Diam: 2.00 cm MV A Prime:    10.4 cm/s Julien Nordmann MD Electronically signed by Julien Nordmann MD Signature Date/Time: 03/03/2021/6:08:05 PM    Final    CT Renal Stone Study  Result Date: 03/02/2021 CLINICAL DATA:  Renal parenchymal disease with hematuria EXAM: CT ABDOMEN AND PELVIS WITHOUT CONTRAST TECHNIQUE: Multidetector CT imaging of the abdomen and pelvis was performed following the standard protocol without IV contrast. COMPARISON:  07/02/2012 FINDINGS: Lower chest: No acute abnormality. Hepatobiliary: No focal hepatic abnormality. Gallbladder unremarkable. Pancreas: No focal abnormality or ductal dilatation. Spleen: No focal abnormality.  Normal size. Adrenals/Urinary Tract: No adrenal abnormality. No focal renal abnormality. No stones or hydronephrosis. Urinary bladder is unremarkable. Stomach/Bowel: Normal appendix. Stomach, large and small bowel grossly unremarkable. Vascular/Lymphatic: No evidence of aneurysm or adenopathy. Reproductive: No visible focal abnormality. Other: Small amount of free fluid noted adjacent to the liver and in the cul-de-sac. Musculoskeletal: No acute bony abnormality. IMPRESSION: No renal or ureteral  stones.  No hydronephrosis. Small amount of free fluid in the abdomen and  pelvis of unknown etiology. Electronically Signed   By: Charlett Nose M.D.   On: 03/02/2021 20:38   ECHO TEE  Result Date: 03/06/2021    TRANSESOPHOGEAL ECHO REPORT   Patient Name:   Jerome Mccormick Date of Exam: 03/06/2021 Medical Rec #:  [id number]      Height:       67.0 in Accession #:    [id number]     Weight:       132.0 lb Date of Birth:  1991/06/27      BSA:          1.695 m Patient Age:    30 years       BP:           121/73 mmHg Patient Gender: M              HR:           68 bpm. Exam Location:  [facility] Procedure: Transesophageal Echo, Color Doppler and Cardiac Doppler Indications:     Not listed on TEE check-in sheet  History:         Patient has prior history of Echocardiogram examinations, most                  recent 03/03/2021. Signs/Symptoms:Murmur.  Sonographer:     Cristela Blue RDCS (AE) Referring Phys:  [id number] Francee Nodal FURTH Diagnosing Phys: Debbe Odea MD PROCEDURE: The transesophogeal probe was passed without difficulty through the esophogus of the patient. Sedation performed by performing physician. The patient developed no complications during the procedure. IMPRESSIONS  1. Left ventricular ejection fraction, by estimation, is 60 to 65%. The left ventricle has normal function. The left ventricle has no regional wall motion abnormalities.  2. Right ventricular systolic function is normal. The right ventricular size is normal.  3. No left atrial/left atrial appendage thrombus was detected.  4. 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 The mitral valve is abnormal. Mild to moderate mitral valve regurgitation. 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  5. The aortic valve is tricuspid. Aortic valve regurgitation is not visualized. 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 There is no clear evidence for  endocarditis. likely Etiology for ruptured chordae include mitral valve prolapse or endocarditis (since patient is bacteremic).  Recommend emperic treatment for endocarditis since patient is bacteremic. Surgical consult for mitral valve repair/replacement considerations can be obtained on outpatient basis (patient is hemodynamically stable and not in heart failure). FINDINGS  Left Ventricle: Left ventricular ejection fraction, by estimation, is 60 to 65%. The left ventricle has normal function. The left ventricle has no regional wall motion abnormalities. The left ventricular internal cavity size was normal in size. Right Ventricle: The right ventricular size is normal. No increase in right ventricular wall thickness. Right ventricular systolic function is normal. Left Atrium: Left atrial size was normal in size. No left atrial/left atrial appendage thrombus was detected. Right Atrium: Right atrial size was normal in size. Pericardium: There is no evidence of pericardial effusion. 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 The mitral valve is abnormal. 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 Mild to moderate mitral valve regurgitation. Tricuspid Valve: The tricuspid valve is normal in structure. Tricuspid valve regurgitation is trivial. Aortic Valve: The aortic valve is tricuspid. Aortic valve regurgitation is not visualized. Pulmonic Valve: The pulmonic valve was  normal in structure. Pulmonic valve regurgitation is trivial. Aorta: The aortic root is normal in size and structure. Venous: The inferior vena cava was not well visualized. IAS/Shunts: No atrial level shunt detected by color flow Doppler. Debbe Odea MD Electronically signed by Debbe Odea MD Signature Date/Time: 03/06/2021/5:00:13 PM    Final    ECHO INTRAOPERATIVE TEE  Result Date: 03/21/2021  *INTRAOPERATIVE TRANSESOPHAGEAL REPORT *  Patient Name:   Jerome Mccormick  Date of Exam: 03/21/2021 Medical Rec #:  [id number]      Height:       67.0 in Accession #:    [id number]     Weight:       172.0 lb Date of Birth:  1990-12-24      BSA:          1.90 m Patient Age:    30 years       BP:           137/93 mmHg Patient Gender: M              HR:           80 bpm. Exam Location:  Anesthesiology Transesophogeal exam was perform intraoperatively during surgical procedure. Patient was closely monitored under general anesthesia during the entirety of examination. Indications:     I34.0 Mitral valve insufficiency Sonographer:     Leta Jungling RDCS Performing Phys: [address] OWEN Diagnosing Phys: Gaynelle Adu MD Complications: No known complications during this procedure. POST-OP IMPRESSIONS - Left Ventricle: The left ventricle is unchanged from pre-bypass. - Right Ventricle: The right ventricle appears unchanged from pre-bypass. - Left Atrial Appendage: The left atrial appendage appears unchanged from pre-bypass. - Aortic Valve: The aortic valve appears unchanged from pre-bypass. - Mitral Valve: There is no regurgitation. The mitral valve is status post repair with an annuloplasty ring. The mean gradient across the valve is 7-46mmHg. - Tricuspid Valve: The tricuspid valve appears unchanged from pre-bypass. - Pulmonic Valve: The pulmonic valve appears unchanged from pre-bypass. PRE-OP FINDINGS  Left Ventricle: The left ventricle has normal systolic function, with an ejection fraction of 60-65%. The cavity size was normal. There is no increase in left ventricular wall thickness. There is no left ventricular hypertrophy. Right Ventricle: The right ventricle has normal systolic function. The cavity was normal. There is no increase in right ventricular wall thickness. Left Atrium: Left atrial size was not assessed. No left atrial/left atrial appendage thrombus was detected. Right Atrium: Right atrial size was not assessed. Interatrial Septum: No atrial level shunt detected by color  flow Doppler. Pericardium: Trivial pericardial effusion is present. The pericardial effusion is posterior to the left ventricle. Mitral Valve: Abnormal. Mitral valve regurgitation is moderate by color flow Doppler. The MR jet is posteriorly-directed. There is a small mobile mitral valve vegetation present on the A1 cusp. Tricuspid Valve: The tricuspid valve was normal in structure. Tricuspid valve regurgitation was not visualized by color flow Doppler. Aortic Valve: The aortic valve is normal in structure. Aortic valve regurgitation was not visualized by color flow Doppler. Pulmonic Valve: The pulmonic valve was normal in structure. Pulmonic valve regurgitation is trivial by color flow Doppler. +--------------+--------++ LEFT VENTRICLE         +--------------+--------++ PLAX 2D                +--------------+--------++ LVIDd:        4.70 cm  +--------------+--------++ LVIDs:        3.10 cm  +--------------+--------++ LVOT diam:  2.00 cm  +--------------+--------++ LV SV:        64 ml    +--------------+--------++ LV SV Index:  33.28    +--------------+--------++ LVOT Area:    3.14 cm +--------------+--------++                        +--------------+--------++ +-------------+----------++ MITRAL VALVE            +--------------+-------+ +-------------+----------++ SHUNTS                MV Peak grad:14.1 mmHg  +--------------+-------+ +-------------+----------++ Systemic Diam:2.00 cm MV Mean grad:9.0 mmHg   +--------------+-------+ +-------------+----------++ MV Vmax:     1.88 m/s   +-------------+----------++ MV Vmean:    139.0 cm/s +-------------+----------++ MV VTI:      0.38 m     +-------------+----------++  Gaynelle Adu MD Electronically signed by Gaynelle Adu MD Signature Date/Time: 03/21/2021/2:40:19 PM    Final    US BIOPSY (KIDNEY)  Result Date: 03/04/2021 INDICATION: 30 year old male with a history of acute renal failure and  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 ANESTHESIA/SEDATION: Moderate (conscious) sedation was employed during this procedure. A total of Versed 2.0 mg and Fentanyl 100 mcg was administered intravenously. Moderate Sedation Time: 10 minutes. The patient's level of consciousness and vital signs were monitored continuously by radiology nursing throughout the procedure under my direct supervision. FLUOROSCOPY TIME:  Ultrasound COMPLICATIONS: None PROCEDURE: Informed written consent was obtained from the patient after a thorough discussion of the procedural risks, benefits and alternatives. All questions were addressed. Maximal Sterile Barrier Technique was utilized including caps, mask, sterile gowns, sterile gloves, sterile drape, hand hygiene and skin antiseptic. A timeout was performed prior to the initiation of the procedure Patient was positioned prone position on the gantry table. Images were stored sent to PACs. Once the patient is prepped and draped in the usual sterile fashion, the skin and subcutaneous tissues overlying the left kidney were generously infiltrated 1% lidocaine for local anesthesia. Using ultrasound guidance, a 15 gauge guide needle was advanced into the lower cortex of the left kidney. Once we confirmed location of the needle tip, 2 separate 16 gauge core biopsy were achieved. Two Gel-Foam pledgets were infused with a small amount of saline. The needle was removed. Final images were stored. The patient tolerated the procedure well and remained hemodynamically stable throughout. No complications were encountered and no significant blood loss encountered. IMPRESSION: Status post ultrasound-guided biopsy of left kidney for medical renal purpose. Signed, Yvone Neu. Reyne Dumas, RPVI [facility] Radiology Electronically Signed   By: Gilmer Mor D.O.   On: 03/04/2021 12:11   ECHOCARDIOGRAM COMPLETE BUBBLE STUDY  Result  Date: 03/28/2021    ECHOCARDIOGRAM REPORT   Patient Name:   Jerome Mccormick Date of Exam: 03/28/2021 Medical Rec #:  [id number]      Height:       67.0 in Accession #:    [id number]     Weight:       163.4 lb Date of Birth:  Aug 25, 1991      BSA:          1.856 m Patient Age:    30 years       BP:           97/59 mmHg Patient Gender: M              HR:  112 bpm. Exam Location:  [facility] Procedure: 2D Echo, Cardiac Doppler, Color Doppler and Saline Contrast Bubble            Study Indications:     Endocarditis  History:         Patient has prior history of Echocardiogram examinations, most                  recent 03/21/2021. S/P mitral valve repair.  Sonographer:     Cristela Blue RDCS (AE) Referring Phys:  [id number] Jolyn Nap Diagnosing Phys: Julien Nordmann MD  Sonographer Comments: No apical window, Technically challenging study due to limited acoustic windows and echo performed with patient supine and on artificial respirator. IMPRESSIONS  1. Left ventricular ejection fraction, by estimation, is 55 to 60%. The left ventricle has normal function. The left ventricle has no regional wall motion abnormalities. Left ventricular diastolic parameters are indeterminate.  2. Right ventricular systolic function is normal. The right ventricular size is normal. There is normal pulmonary artery systolic pressure. The estimated right ventricular systolic pressure is 34.4 mmHg.  3. The mitral valve has been repaired/replaced, s/p annuloplasty ring. Difficult to visualize. Mild mitral valve regurgitation. Gradient not obtained.  4. Tricuspid valve regurgitation is mild to moderate.  5. The aortic valve is normal in structure, no vegetation. Aortic valve regurgitation is not visualized.  6. Agitated saline contrast bubble study was negative, with no evidence of any interatrial shunt.  7. Left pleural effusion noted, 7 cm. FINDINGS  Left Ventricle: Left ventricular ejection fraction, by estimation, is 55 to 60%. The left  ventricle has normal function. The left ventricle has no regional wall motion abnormalities. The left ventricular internal cavity size was normal in size. There is  no left ventricular hypertrophy. Left ventricular diastolic parameters are indeterminate. Right Ventricle: The right ventricular size is normal. No increase in right ventricular wall thickness. Right ventricular systolic function is normal. There is normal pulmonary artery systolic pressure. The tricuspid regurgitant velocity is 2.71 m/s, and  with an assumed right atrial pressure of 5 mmHg, the estimated right ventricular systolic pressure is 34.4 mmHg. Left Atrium: Left atrial size was normal in size. Right Atrium: Right atrial size was normal in size. Pericardium: There is no evidence of pericardial effusion. Mitral Valve: The mitral valve has been repaired/replaced. Mild mitral valve regurgitation. No evidence of mitral valve stenosis. Tricuspid Valve: The tricuspid valve is normal in structure. Tricuspid valve regurgitation is mild to moderate. No evidence of tricuspid stenosis. Aortic Valve: The aortic valve is normal in structure. Aortic valve regurgitation is not visualized. No aortic stenosis is present. Pulmonic Valve: The pulmonic valve was normal in structure. Pulmonic valve regurgitation is not visualized. No evidence of pulmonic stenosis. Aorta: The aortic root is normal in size and structure. Venous: The inferior vena cava is normal in size with greater than 50% respiratory variability, suggesting right atrial pressure of 3 mmHg. IAS/Shunts: No atrial level shunt detected by color flow Doppler. Agitated saline contrast was given intravenously to evaluate for intracardiac shunting. Agitated saline contrast bubble study was negative, with no evidence of any interatrial shunt.  LEFT VENTRICLE PLAX 2D LVIDd:         3.74 cm LVIDs:         2.71 cm LV PW:         1.56 cm LV IVS:        0.83 cm LVOT diam:     2.00 cm LVOT Area:  3.14 cm  LEFT  ATRIUM         Index LA diam:    3.00 cm 1.62 cm/m                        PULMONIC VALVE AORTA                 PV Vmax:        1.04 m/s Ao Root diam: 2.50 cm PV Peak grad:   4.3 mmHg                       RVOT Peak grad: 8 mmHg  TRICUSPID VALVE TR Peak grad:   29.4 mmHg TR Vmax:        271.00 cm/s  SHUNTS Systemic Diam: 2.00 cm Julien Nordmann MD Electronically signed by Julien Nordmann MD Signature Date/Time: 03/28/2021/2:35:39 PM    Final    US SCROTUM W/DOPPLER  Result Date: 03/16/2021 CLINICAL DATA:  Edema, pain for 2 weeks. EXAM: SCROTAL ULTRASOUND DOPPLER ULTRASOUND OF THE TESTICLES TECHNIQUE: Complete ultrasound examination of the testicles, epididymis, and other scrotal structures was performed. Color and spectral Doppler ultrasound were also utilized to evaluate blood flow to the testicles. COMPARISON:  None. FINDINGS: Right testicle Measurements: 5.1 x 3.0 x 2.8 cm. No mass or microlithiasis visualized. Left testicle Measurements: 4.8 x 3.1 x 3.2 cm. No mass or microlithiasis visualized. Right epididymis:  Normal in size and appearance. Left epididymis:  Normal in size and appearance. Hydrocele:  None visualized. Varicocele:  Unable to evaluate due to extensive scrotal edema. Pulsed Doppler interrogation of both testes demonstrates normal low resistance arterial and venous waveforms bilaterally. Marked bilateral scrotal edema. IMPRESSION: Marked scrotal edema, otherwise no acute finding sonographically. Electronically Signed   By: Emmaline Kluver M.D.   On: 03/16/2021 12:38   VAS US DOPPLER PRE CABG  Result Date: 03/20/2021 PREOPERATIVE VASCULAR EVALUATION  Indications:      Pre-MVR. Comparison Study: No prior studies. Performing Technologist: Jean Rosenthal RDMS,RVT  Examination Guidelines: A complete evaluation includes B-mode imaging, spectral Doppler, color Doppler, and power Doppler as needed of all accessible portions of each vessel. Bilateral testing is considered an integral part of a  complete examination. Limited examinations for reoccurring indications may be performed as noted.  Right Carotid Findings: +----------+--------+--------+--------+--------+--------+           PSV cm/sEDV cm/sStenosisDescribeComments +----------+--------+--------+--------+--------+--------+ CCA Prox  141     29                               +----------+--------+--------+--------+--------+--------+ CCA Distal101     24                               +----------+--------+--------+--------+--------+--------+ ICA Prox  50      20                               +----------+--------+--------+--------+--------+--------+ ICA Distal127     56                      tortuous +----------+--------+--------+--------+--------+--------+ ECA       96                                       +----------+--------+--------+--------+--------+--------+  Portions of this table do not appear on this page. +----------+--------+-------+----------------+------------+           PSV cm/sEDV cmsDescribe        Arm Pressure +----------+--------+-------+----------------+------------+ Subclavian142            Multiphasic, WNL             +----------+--------+-------+----------------+------------+ +---------+--------+--+--------+--+---------+ VertebralPSV cm/s58EDV cm/s19Antegrade +---------+--------+--+--------+--+---------+ Left Carotid Findings: +----------+--------+--------+--------+--------+--------+           PSV cm/sEDV cm/sStenosisDescribeComments +----------+--------+--------+--------+--------+--------+ CCA Prox  158     27                               +----------+--------+--------+--------+--------+--------+ CCA Distal103     20                               +----------+--------+--------+--------+--------+--------+ ICA Prox  75      24                               +----------+--------+--------+--------+--------+--------+ ICA Distal96      47                       tortuous +----------+--------+--------+--------+--------+--------+ ECA       86      16                               +----------+--------+--------+--------+--------+--------+ +----------+--------+--------+----------------+------------+ SubclavianPSV cm/sEDV cm/sDescribe        Arm Pressure +----------+--------+--------+----------------+------------+           136             Multiphasic, WNL             +----------+--------+--------+----------------+------------+ +---------+--------+--+--------+--+---------+ VertebralPSV cm/s71EDV cm/s27Antegrade +---------+--------+--+--------+--+---------+  ABI Findings: +--------+------------------+-----+---------+--------+ Right   Rt Pressure (mmHg)IndexWaveform Comment  +--------+------------------+-----+---------+--------+ Brachial                       triphasicPICC     +--------+------------------+-----+---------+--------+ +--------+------------------+-----+---------+-------+ Left    Lt Pressure (mmHg)IndexWaveform Comment +--------+------------------+-----+---------+-------+ [id number]                    triphasic        +--------+------------------+-----+---------+-------+  Right Doppler Findings: +--------+--------+-----+---------+--------+ Site    PressureIndexDoppler  Comments +--------+--------+-----+---------+--------+ Brachial             triphasicPICC     +--------+--------+-----+---------+--------+ Radial               triphasic         +--------+--------+-----+---------+--------+ Ulnar                triphasic         +--------+--------+-----+---------+--------+  Left Doppler Findings: +--------+--------+-----+---------+--------+ Site    PressureIndexDoppler  Comments +--------+--------+-----+---------+--------+ [id number]          triphasic         +--------+--------+-----+---------+--------+ Radial               triphasic          +--------+--------+-----+---------+--------+ Ulnar                triphasic         +--------+--------+-----+---------+--------+  Summary: Right Carotid: There is no evidence of stenosis in the right ICA.  There was no                evidence of thrombus, dissection, atherosclerotic plaque or                stenosis in the cervical carotid system. Left Carotid: There is no evidence of stenosis in the left ICA. There was no               evidence of thrombus, dissection, atherosclerotic plaque or               stenosis in the cervical carotid system. Vertebrals:  Bilateral vertebral arteries demonstrate antegrade flow. Subclavians: Normal flow hemodynamics were seen in bilateral subclavian              arteries. Right Upper Extremity: Doppler waveforms remain within normal limits with right radial compression. Doppler waveforms remain within normal limits with right ulnar compression. Left Upper Extremity: Doppler waveforms remain within normal limits with left radial compression. Doppler waveforms remain within normal limits with left ulnar compression.  Electronically signed by Heath Lark on 03/20/2021 at 4:34:25 PM.    Final    [place] EKG Site Rite  Result Date: 03/25/2021 If [facility] image not attached, placement could not be confirmed due to current cardiac rhythm.  [place] EKG SITE RITE  Result Date: 03/10/2021 If Site Rite image not attached, placement could not be confirmed due to current cardiac rhythm.   Microbiology Recent Results (from the past 240 hour(s))  MRSA PCR Screening     Status: None   Collection Time: 03/20/21  5:11 PM   Specimen: Nasopharyngeal  Result Value Ref Range Status   MRSA by PCR NEGATIVE NEGATIVE Final    Comment:        The GeneXpert MRSA Assay (FDA approved for NASAL specimens only), is one component of a comprehensive MRSA colonization surveillance program. It is not intended to diagnose MRSA infection nor to guide or monitor treatment for MRSA  infections. Performed at [facility], [address] [place], [place] [address]   Surgical pcr screen     Status: None   Collection Time: 03/20/21 11:48 PM   Specimen: Nasal Mucosa; Nasal Swab  Result Value Ref Range Status   MRSA, PCR NEGATIVE NEGATIVE Final   Staphylococcus aureus NEGATIVE NEGATIVE Final    Comment: (NOTE) The Xpert SA Assay (FDA approved for NASAL specimens in patients 41 years of age and older), is one component of a comprehensive surveillance program. It is not intended to diagnose infection nor to guide or monitor treatment. Performed at [facility], [address] [place], [place] [address]   Fungus Culture With Stain     Status: None (Preliminary result)   Collection Time: 03/21/21 10:24 AM   Specimen: Soft Tissue, Other  Result Value Ref Range Status   Fungus Stain Final report  Final    Comment: (NOTE) Performed At: [facility] [address] [place], [place] [id number] Jolene Schimke MD WG:[id number]    Fungus (Mycology) Culture PENDING  Incomplete   Fungal Source TISSUE  Final    Comment: MITRAL VALVE Performed at [facility], [address] [place], [place] [address]   Aerobic/Anaerobic Culture w Gram Stain (surgical/deep wound)     Status: None   Collection Time: 03/21/21 10:24 AM   Specimen: Soft Tissue, Other  Result Value Ref Range Status   Specimen Description TISSUE MITRAL VALVE  Final   Special Requests NONE  Final   Gram Stain   Final    RARE WBC PRESENT, PREDOMINANTLY MONONUCLEAR NO ORGANISMS SEEN    Culture   Final    RARE STAPHYLOCOCCUS CAPITIS CRITICAL RESULT CALLED TO, READ BACK BY AND VERIFIED WITH: RN C.ECKELNANN AT 1323 ON 03/23/2021 BY T.SAAD NO ANAEROBES ISOLATED Performed at [facility], [address] [place], [place] [address]    Report Status 03/26/2021 FINAL  Final   Organism ID, Bacteria STAPHYLOCOCCUS CAPITIS  Final      Susceptibility   Staphylococcus capitis - MIC*     CIPROFLOXACIN <=0.5 SENSITIVE Sensitive     ERYTHROMYCIN 2 INTERMEDIATE Intermediate     GENTAMICIN <=0.5 SENSITIVE Sensitive     OXACILLIN <=0.25 SENSITIVE Sensitive     TETRACYCLINE <=1 SENSITIVE Sensitive     VANCOMYCIN 1 SENSITIVE Sensitive     TRIMETH/SULFA <=10 SENSITIVE Sensitive     CLINDAMYCIN >=8 RESISTANT Resistant     RIFAMPIN <=0.5 SENSITIVE Sensitive     Inducible Clindamycin NEGATIVE Sensitive     * RARE STAPHYLOCOCCUS CAPITIS  Acid Fast Smear (AFB)     Status: None   Collection Time: 03/21/21 10:24 AM   Specimen: Soft Tissue, Other  Result Value Ref Range Status   AFB Specimen Processing Comment  Final    Comment: Tissue Grinding and Digestion/Decontamination   Acid Fast Smear Negative  Final    Comment: (NOTE) Performed At: [facility] [address] [place], [place] [id number] Jolene Schimke MD AO:[id number]    Source (AFB) TISSUE  Final    Comment: MITRAL VALVE Performed at [facility], [address], [place], [place] [address]   Fungus Culture Result     Status: None   Collection Time: 03/21/21 10:24 AM  Result Value Ref Range Status   Result 1 Comment  Final    Comment: (NOTE) KOH/Calcofluor preparation:  no fungus observed. Performed At: [facility] [address] [place], [place] [id number] Jolene Schimke MD NU:[id number]   Fungus Culture With Stain     Status: None (Preliminary result)   Collection Time: 03/21/21 10:31 AM   Specimen: Soft Tissue, Other  Result Value Ref Range Status   Fungus Stain Final report  Final    Comment: (NOTE) Performed At: [facility] [address] [place], [place] [id number] Jolene Schimke MD LO:[id number]    Fungus (Mycology) Culture PENDING  Incomplete   Fungal Source WOUND  Final    Comment: MITRAL VALVE SPEC B Performed at [facility], [address] [place], [place] [address]   Aerobic/Anaerobic Culture w Gram Stain (surgical/deep wound)     Status: None    Collection Time: 03/21/21 10:31 AM   Specimen: Soft Tissue, Other  Result Value Ref Range Status   Specimen Description WOUND MITRAL VALVE SPEC B  Final   Special Requests WD MITRAL VALVE  Final   Gram Stain   Final    RARE WBC PRESENT, PREDOMINANTLY MONONUCLEAR RARE GRAM POSITIVE COCCI    Culture   Final    No growth aerobically or anaerobically. Performed at [facility], [address], [place], [place] [address]    Report Status 03/26/2021 FINAL  Final  Acid Fast Smear (AFB)     Status: None   Collection Time: 03/21/21 10:31 AM   Specimen: Soft Tissue, Other  Result Value Ref Range Status   AFB Specimen Processing Comment  Final    Comment: Tissue Grinding and Digestion/Decontamination   Acid  Fast Smear Negative  Final    Comment: (NOTE) Performed At: [facility] [address] [place], [place] [id number] Jolene Schimke MD WU:[id number]    Source (AFB) WOUND  Final    Comment: MITRAL VALVE SPEC B Performed at [facility], [address], [place], [place] [address]   Fungus Culture Result     Status: None   Collection Time: 03/21/21 10:31 AM  Result Value Ref Range Status   Result 1 Comment  Final    Comment: (NOTE) KOH/Calcofluor preparation:  no fungus observed. Performed At: [facility] [address] [place], [place] [id number] Jolene Schimke MD NG:[id number]   Culture, blood (routine x 2)     Status: None (Preliminary result)   Collection Time: 04/21/2021 12:25 PM   Specimen: BLOOD  Result Value Ref Range Status   Specimen Description BLOOD RIGHT ANTECUBITAL  Final   Special Requests   Final    BOTTLES DRAWN AEROBIC AND ANAEROBIC Blood Culture adequate volume   Culture   Final    NO GROWTH 2 DAYS Performed at [facility], [address], [place], [place] [address]    Report Status PENDING  Incomplete  Culture, blood (routine x 2)     Status: None (Preliminary result)   Collection Time: Apr 21, 2021 12:25 PM   Specimen:  BLOOD  Result Value Ref Range Status   Specimen Description BLOOD BLOOD LEFT HAND  Final   Special Requests   Final    BOTTLES DRAWN AEROBIC AND ANAEROBIC Blood Culture adequate volume   Culture   Final    NO GROWTH 2 DAYS Performed at [facility], [address], [place], [place] [address]    Report Status PENDING  Incomplete  Resp Panel by RT-PCR (Flu A&B, Covid) Nasopharyngeal Swab     Status: None   Collection Time: 2021-04-21  1:00 PM   Specimen: Nasopharyngeal Swab; Nasopharyngeal(NP) swabs in vial transport medium  Result Value Ref Range Status   SARS Coronavirus 2 by RT PCR NEGATIVE NEGATIVE Final    Comment: (NOTE) SARS-CoV-2 target nucleic acids are NOT DETECTED.  The SARS-CoV-2 RNA is generally detectable in upper respiratory specimens during the acute phase of infection. The lowest concentration of SARS-CoV-2 viral copies this assay can detect is 138 copies/mL. A negative result does not preclude SARS-Cov-2 infection and should not be used as the sole basis for treatment or other patient management decisions. A negative result may occur with  improper specimen collection/handling, submission of specimen other than nasopharyngeal swab, presence of viral mutation(s) within the areas targeted by this assay, and inadequate number of viral copies(<138 copies/mL). A negative result must be combined with clinical observations, patient history, and epidemiological information. The expected result is Negative.  Fact Sheet for Patients:  BloggerCourse.com  Fact Sheet for Healthcare Providers:  SeriousBroker.it  This test is no t yet approved or cleared by the [place] FDA and  has been authorized for detection and/or diagnosis of SARS-CoV-2 by FDA under an Emergency Use Authorization (EUA). This EUA will remain  in effect (meaning this test can be used) for the duration of the COVID-19 declaration under Section  564(b)(1) of the Act, 21 U.S.C.section 360bbb-3(b)(1), unless the authorization is terminated  or revoked sooner.       Influenza A by PCR NEGATIVE NEGATIVE Final   Influenza B by PCR NEGATIVE NEGATIVE Final    Comment: (NOTE) The Xpert Xpress SARS-CoV-2/FLU/RSV plus assay is intended as an aid in the diagnosis  of influenza from Nasopharyngeal swab specimens and should not be used as a sole basis for treatment. Nasal washings and aspirates are unacceptable for Xpert Xpress SARS-CoV-2/FLU/RSV testing.  Fact Sheet for Patients: BloggerCourse.com  Fact Sheet for Healthcare Providers: SeriousBroker.it  This test is not yet approved or cleared by the [place] FDA and has been authorized for detection and/or diagnosis of SARS-CoV-2 by FDA under an Emergency Use Authorization (EUA). This EUA will remain in effect (meaning this test can be used) for the duration of the COVID-19 declaration under Section 564(b)(1) of the Act, 21 U.S.C. section 360bbb-3(b)(1), unless the authorization is terminated or revoked.  Performed at [facility], [address] [place], [place] [address]   Culture, blood (routine x 2)     Status: None (Preliminary result)   Collection Time: 2021/04/19  5:15 PM   Specimen: BLOOD  Result Value Ref Range Status   Specimen Description BLOOD BLOOD LEFT WRIST  Final   Special Requests   Final    BOTTLES DRAWN AEROBIC AND ANAEROBIC Blood Culture adequate volume   Culture   Final    NO GROWTH 2 DAYS Performed at [facility], [address] [place], [place] [address]    Report Status PENDING  Incomplete  Culture, blood (routine x 2)     Status: None (Preliminary result)   Collection Time: Apr 19, 2021  5:16 PM   Specimen: BLOOD  Result Value Ref Range Status   Specimen Description BLOOD BLOOD LEFT FOREARM  Final   Special Requests   Final    BOTTLES DRAWN AEROBIC AND ANAEROBIC Blood Culture  adequate volume   Culture   Final    NO GROWTH 2 DAYS Performed at [facility], [address] [place], [place] [address]    Report Status PENDING  Incomplete    Lab Basic Metabolic Panel: Recent Labs  Lab 03/25/21 0411 2021/04/19 1225 19-Apr-2021 1300 Apr 19, 2021 1714 2021/04/19 1927 03/28/21 0106 03/28/21 0458 03/28/21 1125 03/28/21 1812 03/28/21 2301 03/26/2021 0008 03/16/2021 0353 03/05/2021 0747  NA 135 138   < > 142 143   < > 146*   < > 152* 170* 155* 157*  156* 157*  K 4.1 3.2*  --  3.1* 3.4*  --  3.8  --   --   --   --  3.4*  --   CL 102 95*  --  101  --   --  107  --   --   --   --  125*  --   CO2 27 26  --  30  --   --  30  --   --   --   --  26  --   GLUCOSE 93 219*  --  147*  --   --  124*  --   --   --   --  112*  --   BUN 21* 23*  --  23*  --   --  18  --   --   --   --  19  --   CREATININE 1.47* 1.39*  --  1.23  --   --  1.25*  --   --   --   --  1.27*  --   CALCIUM 8.4* 8.8*  --  8.0*  --   --  7.6*  --   --   --   --  7.7*  --   MG  --  1.6*  --   --  1.3*  --  3.2*  --   --   --   --  2.8*  --   PHOS  --   --   --   --  5.2*  --  5.0*  --   --   --   --  4.1  --    < > = values in this interval not displayed.   Liver Function Tests: Recent Labs  Lab 02/06/21 1225 02/06/21 1714  AST 45* 36  ALT 89* 74*  ALKPHOS 78 64  BILITOT 0.6 0.6  PROT 7.4 6.6  ALBUMIN 3.4* 3.2*   No results for input(s): LIPASE, AMYLASE in the last 168 hours. No results for input(s): AMMONIA in the last 168 hours. CBC: Recent Labs  Lab 03/25/21 0411 02/06/21 1225 02/06/21 1714 03/28/21 0458 03/28/21 2041 03/26/2021 0353  WBC 8.5 16.9* 11.6* 8.0  --  7.6  NEUTROABS  --  11.0*  --   --   --   --   HGB 8.6* 10.7* 9.1* 6.9* 7.3* 7.2*  HCT 27.2* 33.0* 28.0* 21.2* 22.9* 23.0*  MCV 91.6 89.9 89.5 90.6  --  92.0  PLT 349 574* 366 303  --  264   Cardiac Enzymes: No results for input(s): CKTOTAL, CKMB, CKMBINDEX, TROPONINI in the last 168 hours. Sepsis Labs: Recent Labs   Lab 02/06/21 1225 02/06/21 1714 02/06/21 1927 03/28/21 0458 03/07/2021 0353  WBC 16.9* 11.6*  --  8.0 7.6  LATICACIDVEN  --  1.3 1.1  --   --     Procedures/Operations  02-08-2021 Left Craniotomy for subdural hematoma 02-08-2021 Arterial line placement 02-08-2021 ETT placed  Britton L Rust-Chester 03/19/2021, 10:50 PM

## 2021-03-31 NOTE — Progress Notes (Signed)
Pt care resumed. No changes to note overnight. Vent settings unchanged. Will continue current poc.

## 2021-03-31 NOTE — Progress Notes (Signed)
Multiple family members at bedside for transition to comfort care measures only. Patient transitioned to ativan and morphine infusions with PRN bolus doses ordered for comfort. Robinol given once. Palliative provider and chaplain present at bedside during compassionate extubation. Emotional support provided to family members.

## 2021-03-31 NOTE — ED Provider Notes (Signed)
[facility] Emergency Department Provider Note   ____________________________________________   Event Date/Time   First MD Initiated Contact with Patient 03/16/21 [phone]     (approximate)  I have reviewed the triage vital signs and the nursing notes.   HISTORY  Chief Complaint Edema    HPI AMAAR Mccormick is a 30 y.o. male w/ a below stated PMHx who presents for worsening LE edema ans SOB. Pt states a recent hospitalization for AKI, bacteremia, and endocarditis. Pt is currently receiving infusion of ampicilin via PICC line. Pt states he has been taking torsemide 69m daily with minimal relief in the edema. Denies any exacerbating or relieving factors. Denies CP, abd pain, n/v/d, or weakness/numbness/paresthesias         Past Medical History:  Diagnosis Date  . Allergy   . 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 Headache   . Postinfectious glomerulonephritis   . 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 S/P craniotomy 03/09/2021  . ICH (intracerebral hemorrhage) ([place] 03/04/2021  . Post-infectious glomerulonephritis   . 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 S/P MVR (mitral valve repair) 03/21/2021  . Arm DVT (deep venous thromboembolism), acute, right ([place] 03/19/2021  . Acute diastolic CHF (congestive heart failure) ([place] 03/16/2021  . History of anemia due to chronic kidney disease 03/16/2021  . Nicotine dependence 03/16/2021  . Hyperkalemia 03/16/2021  . Acute glomerulonephritis syndrome 03/16/2021  . Mitral valve disease   . Cardiac murmur 03/05/2021  . 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 Nonrheumatic mitral valve regurgitation   . Anemia 03/03/2021  . AKI (acute kidney injury) ([place] 03/02/2021    Past Surgical History:  Procedure  Laterality Date  . CRANIOTOMY Left 03/06/2021   Procedure: CRANIOTOMY HEMATOMA EVACUATION SUBDURAL;  Surgeon: YMeade Maw MD;  Location: [facility] ORS;  Service: Neurosurgery;  Laterality: Left;  . MITRAL VALVE REPAIR N/A 03/21/2021   Procedure: MITRAL VALVE REPAIR (MVR) USING MEDTRONIC 28MM SIMUFORM SEMI-RIGID ANNULOPLASTY RING;  Surgeon: ORexene Alberts MD;  [place]  Service: Open Heart Surgery;  Laterality: N/A;  . TEE WITHOUT CARDIOVERSION N/A 03/06/2021   Procedure: TRANSESOPHAGEAL ECHOCARDIOGRAM (TEE);  Surgeon: AKate Sable MD;  Location: [facility] ORS;  Service: Cardiovascular;  Laterality: N/A;  . TEE WITHOUT CARDIOVERSION N/A 03/21/2021   Procedure: TRANSESOPHAGEAL ECHOCARDIOGRAM (TEE);  Surgeon: ORexene Alberts MD;  [place] [facility]  Service: Open Heart Surgery;  Laterality: N/A;    Prior to Admission medications   Medication Sig Start Date End Date Taking? Authorizing Provider  acetaminophen (TYLENOL) 325 MG tablet Take 2 tablets (650 mg total) by mouth every 6 (six) hours as needed for mild pain, moderate pain, fever or headache (or Fever >/= 101). 03/11/21  Yes WWyvonnia Dusky MD  amLODipine (NORVASC) 10 MG tablet Take 1 tablet (10 mg total) by mouth daily. 03/26/21   Barrett, Erin R, PA-C  ampicillin IVPB Inject 12 g into the vein daily. As a continuous infusion. Indication: Endocarditis First Dose: yes Last Day of Therapy: 05/02/21 Labs - Once weekly:  CBC/D and BMP, Labs - Every other week:  ESR and CRP Method of administration: Ambulatory Pump (Continuous Infusion) Method of administration may be changed at the discretion of home infusion pharmacist based upon assessment of the patient and/or caregiver's ability to self-administer the medication ordered.  03/26/21 05/03/21  Barrett, Lodema Hong, PA-C  aspirin EC 325 MG EC tablet Take 1 tablet (325 mg total) by mouth daily. 03/25/21   Barrett, Erin R, PA-C  cefTRIAXone (ROCEPHIN) IVPB Inject 2 g into the vein every 12  (twelve) hours. Indication: Endocarditis First Dose: yes Last Day of Therapy: 05/02/21 Labs - Once weekly:  CBC/D and BMP, Labs - Every other week:  ESR and CRP Method of administration: IV Push Method of administration may be changed at the discretion of home infusion pharmacist based upon assessment of the patient and/or caregiver's ability to self-administer the medication ordered. 03/26/21 05/03/21  Barrett, Erin R, PA-C  metoprolol tartrate (LOPRESSOR) 100 MG tablet Take 1 tablet (100 mg total) by mouth 2 (two) times daily. 03/26/21   Barrett, Erin R, PA-C  nicotine (NICODERM CQ - DOSED IN MG/24 HOURS) 14 mg/24hr patch Place 1 patch (14 mg total) onto the skin daily. 03/19/21   Sharen Hones, MD  oxyCODONE (OXY IR/ROXICODONE) 5 MG immediate release tablet Take 1 tablet (5 mg total) by mouth every 4 (four) hours as needed for severe pain. 03/26/21   Barrett, Erin R, PA-C  traZODone (DESYREL) 50 MG tablet Take 0.5 tablets (25 mg total) by mouth at bedtime as needed for sleep. 03/19/21   Sharen Hones, MD    Allergies Patient has no known allergies.  Family History  Problem Relation Age of Onset  . Hypertension Mother     Social History Social History   Tobacco Use  . Smoking status: Current Every Day Smoker    Packs/day: 0.75    Types: Cigarettes  . Smokeless tobacco: Never Used  Substance Use Topics  . Alcohol use: Yes    Comment: >100 oz per week - beer  . Drug use: Yes    Types: Marijuana    Review of Systems Constitutional: No fever/chills Eyes: No visual changes. ENT: No sore throat. Cardiovascular: Denies chest pain. Respiratory: Denies shortness of breath. Gastrointestinal: No abdominal pain.  No nausea, no vomiting.  No diarrhea. Genitourinary: Negative for dysuria. Musculoskeletal: Negative for acute arthralgias Skin: Negative for rash. Positive BLLE edema Neurological: Negative for headaches, weakness/numbness/paresthesias in any extremity Psychiatric: Negative for  suicidal ideation/homicidal ideation   ____________________________________________   PHYSICAL EXAM:  VITAL SIGNS: ED Triage Vitals  Enc Vitals Group     BP 03/16/21 0632 (!) 135/91     Pulse Rate 03/16/21 0632 95     Resp 03/16/21 0632 18     Temp 03/16/21 0632 98.9 F (37.2 C)     Temp Source 03/16/21 0632 Oral     SpO2 03/16/21 0632 100 %     Weight 03/16/21 0633 137 lb (62.1 kg)     Height 03/16/21 0633 '5\' 7"'  (1.702 m)     Head Circumference --      Peak Flow --      Pain Score 03/16/21 0633 9     Pain Loc --      Pain Edu? --      Excl. in [place]? --    Constitutional: Alert and oriented. Well appearing and in no acute distress. Eyes: Conjunctivae are normal. PERRL. Head: Atraumatic. Nose: No congestion/rhinnorhea. Mouth/Throat: Mucous membranes are moist. Neck: No stridor Cardiovascular: Grossly normal heart sounds.  Good peripheral circulation. Respiratory: Normal respiratory effort.  No retractions. Gastrointestinal: Soft and nontender. No distention. Musculoskeletal: No obvious deformities Neurologic:  Normal speech and language. No gross focal neurologic deficits are appreciated. Skin:  Skin is warm and  dry. No rash noted. 2+ BLLE Psychiatric: Mood and affect are normal. Speech and behavior are normal.  ____________________________________________   LABS (all labs ordered are listed, but only abnormal results are displayed)  Labs Reviewed  CBC WITH DIFFERENTIAL/PLATELET - Abnormal; Notable for the following components:      Result Value   RBC 3.13 (*)    Hemoglobin 8.6 (*)    HCT 27.9 (*)    Abs Immature Granulocytes 0.09 (*)    All other components within normal limits  COMPREHENSIVE METABOLIC PANEL - Abnormal; Notable for the following components:   Potassium 5.7 (*)    BUN 69 (*)    Creatinine, Ser 3.26 (*)    Total Protein 6.3 (*)    Albumin 2.6 (*)    Alkaline Phosphatase 33 (*)    GFR, Estimated 25 (*)    All other components within normal  limits  URINALYSIS, ROUTINE W REFLEX MICROSCOPIC - Abnormal; Notable for the following components:   Color, Urine YELLOW (*)    APPearance HAZY (*)    Hgb urine dipstick LARGE (*)    Protein, ur >=300 (*)    Leukocytes,Ua SMALL (*)    RBC / HPF >50 (*)    WBC, UA >50 (*)    All other components within normal limits  BRAIN NATRIURETIC PEPTIDE - Abnormal; Notable for the following components:   B Natriuretic Peptide 570.3 (*)    All other components within normal limits  C4 COMPLEMENT - Abnormal; Notable for the following components:   Complement C4, Body Fluid 10 (*)    All other components within normal limits  C3 COMPLEMENT - Abnormal; Notable for the following components:   C3 Complement 53 (*)    All other components within normal limits  PROTEIN / CREATININE RATIO, URINE - Abnormal; Notable for the following components:   Protein Creatinine Ratio 5.44 (*)    All other components within normal limits  BASIC METABOLIC PANEL - Abnormal; Notable for the following components:   Potassium 5.3 (*)    BUN 64 (*)    Creatinine, Ser 3.32 (*)    Calcium 8.4 (*)    GFR, Estimated 25 (*)    All other components within normal limits  BASIC METABOLIC PANEL - Abnormal; Notable for the following components:   BUN 56 (*)    Creatinine, Ser 3.13 (*)    Calcium 8.2 (*)    GFR, Estimated 26 (*)    All other components within normal limits  HEPARIN LEVEL (UNFRACTIONATED) - Abnormal; Notable for the following components:   Heparin Unfractionated <0.10 (*)    All other components within normal limits  IRON AND TIBC - Abnormal; Notable for the following components:   Iron 26 (*)    TIBC 232 (*)    Saturation Ratios 11 (*)    All other components within normal limits  BASIC METABOLIC PANEL - Abnormal; Notable for the following components:   Glucose, Bld 110 (*)    BUN 49 (*)    Creatinine, Ser 2.82 (*)    Calcium 7.9 (*)    GFR, Estimated 30 (*)    All other components within normal limits   CBC - Abnormal; Notable for the following components:   RBC 2.38 (*)    Hemoglobin 6.6 (*)    HCT 20.8 (*)    All other components within normal limits  HEPARIN LEVEL (UNFRACTIONATED) - Abnormal; Notable for the following components:   Heparin Unfractionated <0.10 (*)  All other components within normal limits  RESP PANEL BY RT-PCR (FLU A&B, COVID) ARPGX2  CULTURE, BLOOD (ROUTINE X 2)  CULTURE, BLOOD (ROUTINE X 2)  VITAMIN B12  MAGNESIUM  TYPE AND SCREEN  PREPARE RBC (CROSSMATCH)  ABO/RH  TROPONIN I (HIGH SENSITIVITY)   ____________________________________________  EKG  ED ECG REPORT I, Naaman Plummer, the attending physician, personally viewed and interpreted this ECG.  Date: 03/16/2021 EKG Time: 0646 Rate: 98 Rhythm: normal sinus rhythm QRS Axis: normal Intervals: normal ST/T Wave abnormalities: normal Narrative Interpretation: no evidence of acute ischemia  ____________________________________________  RADIOLOGY  ED MD interpretation:  2 view XR of the chest shows worsening BL lower lobe edema  Official radiology report(s): No results found.  ____________________________________________   PROCEDURES  Procedure(s) performed (including Critical Care):  Procedures   ____________________________________________   INITIAL IMPRESSION / ASSESSMENT AND PLAN / ED COURSE  As part of my medical decision making, I reviewed the following data within the [facility] notes reviewed and incorporated, Labs reviewed, EKG interpreted, Old chart reviewed, Radiograph reviewed and Notes from prior ED visits reviewed and incorporated        Pt presents for worsening BLLE edema in the setting of recent AKI and endocarditis. Differential diagnosis includes but is not limited to: acute renal failure, heart failure, DVT  Laboratory eval shows worsening renal function Given failure of outpt diuresis, pt will require admission for further evaluation  and management  Dispo: admit to medicine      ____________________________________________   FINAL CLINICAL IMPRESSION(S) / ED DIAGNOSES  Final diagnoses:  Acute kidney failure, unspecified (HCC)  Anasarca  Generalized edema  Scrotal pain  Swelling  Pain     ED Discharge Orders         Ordered    methylPREDNISolone sodium succinate (SOLU-MEDROL) 125 mg/2 mL injection  Every 24 hours,   Status:  Discontinued        03/19/21 0858    carvedilol (COREG) 6.25 MG tablet  2 times daily with meals,   Status:  Discontinued        03/19/21 0858    torsemide (DEMADEX) 100 MG tablet  Daily,   Status:  Discontinued        03/19/21 0858    nicotine (NICODERM CQ - DOSED IN MG/24 HOURS) 14 mg/24hr patch  Daily        03/19/21 0858    traZODone (DESYREL) 50 MG tablet  At bedtime PRN        03/19/21 0858    heparin 25000 UT/250ML infusion  Continuous,   Status:  Discontinued        03/19/21 0858    Increase activity slowly        03/19/21 0858    Diet - low sodium heart healthy        03/19/21 0858           Note:  This document was prepared using Dragon voice recognition software and may include unintentional dictation errors.   Naaman Plummer, MD 2021/04/18 [phone]

## 2021-03-31 DEATH — deceased

## 2021-04-01 LAB — CULTURE, BLOOD (ROUTINE X 2)
Culture: NO GROWTH
Culture: NO GROWTH
Culture: NO GROWTH
Culture: NO GROWTH
Special Requests: ADEQUATE
Special Requests: ADEQUATE
Special Requests: ADEQUATE
Special Requests: ADEQUATE

## 2021-04-05 ENCOUNTER — Ambulatory Visit: Payer: Self-pay | Admitting: Physician Assistant

## 2021-04-09 LAB — BLOOD GAS, ARTERIAL
Acid-Base Excess: 8.4 mmol/L — ABNORMAL HIGH (ref 0.0–2.0)
Bicarbonate: 31.8 mmol/L — ABNORMAL HIGH (ref 20.0–28.0)
FIO2: 0.4
O2 Saturation: 99.5 %
PEEP: 5 cmH2O
Patient temperature: 37
RATE: 20 resp/min
pCO2 arterial: 38 mmHg (ref 32.0–48.0)
pH, Arterial: 7.53 — ABNORMAL HIGH (ref 7.350–7.450)
pO2, Arterial: 155 mmHg — ABNORMAL HIGH (ref 83.0–108.0)

## 2021-04-11 ENCOUNTER — Ambulatory Visit: Payer: Self-pay | Admitting: Infectious Diseases

## 2021-04-11 ENCOUNTER — Ambulatory Visit: Payer: Self-pay | Admitting: Infectious Disease

## 2021-04-19 LAB — FUNGUS CULTURE WITH STAIN

## 2021-04-19 LAB — FUNGAL ORGANISM REFLEX

## 2021-04-19 LAB — FUNGUS CULTURE RESULT

## 2021-04-22 ENCOUNTER — Ambulatory Visit: Payer: Self-pay | Admitting: Thoracic Surgery (Cardiothoracic Vascular Surgery)

## 2021-05-04 LAB — ACID FAST CULTURE WITH REFLEXED SENSITIVITIES (MYCOBACTERIA): Acid Fast Culture: NEGATIVE

## 2021-05-07 ENCOUNTER — Other Ambulatory Visit: Payer: Self-pay

## 2021-05-09 LAB — ACID FAST CULTURE WITH REFLEXED SENSITIVITIES (MYCOBACTERIA): Acid Fast Culture: NEGATIVE
